# Patient Record
Sex: Male | Born: 1965 | Race: White | Hispanic: No | Marital: Single | Smoking: Former smoker
Health system: Southern US, Community
[De-identification: ages and names within clinical notes are randomized; demographics above are authoritative.]

## PROBLEM LIST (undated history)

## (undated) DIAGNOSIS — K604 Rectal fistula, unspecified: Secondary | ICD-10-CM

## (undated) DIAGNOSIS — I10 Essential (primary) hypertension: Secondary | ICD-10-CM

## (undated) DIAGNOSIS — F17200 Nicotine dependence, unspecified, uncomplicated: Secondary | ICD-10-CM

## (undated) DIAGNOSIS — R39198 Other difficulties with micturition: Secondary | ICD-10-CM

## (undated) DIAGNOSIS — E663 Overweight: Secondary | ICD-10-CM

## (undated) DIAGNOSIS — F329 Major depressive disorder, single episode, unspecified: Secondary | ICD-10-CM

## (undated) DIAGNOSIS — K3184 Gastroparesis: Secondary | ICD-10-CM

## (undated) DIAGNOSIS — R300 Dysuria: Secondary | ICD-10-CM

## (undated) DIAGNOSIS — E876 Hypokalemia: Secondary | ICD-10-CM

## (undated) DIAGNOSIS — I639 Cerebral infarction, unspecified: Secondary | ICD-10-CM

## (undated) DIAGNOSIS — R3129 Other microscopic hematuria: Secondary | ICD-10-CM

## (undated) DIAGNOSIS — F603 Borderline personality disorder: Secondary | ICD-10-CM

## (undated) DIAGNOSIS — K644 Residual hemorrhoidal skin tags: Secondary | ICD-10-CM

## (undated) DIAGNOSIS — F419 Anxiety disorder, unspecified: Secondary | ICD-10-CM

## (undated) DIAGNOSIS — K5790 Diverticulosis of intestine, part unspecified, without perforation or abscess without bleeding: Secondary | ICD-10-CM

## (undated) DIAGNOSIS — F209 Schizophrenia, unspecified: Secondary | ICD-10-CM

## (undated) HISTORY — DX: Dysuria: R30.0

## (undated) HISTORY — PX: TONSILLECTOMY: SUR1361

## (undated) HISTORY — DX: Other difficulties with micturition: R39.198

## (undated) HISTORY — DX: Other microscopic hematuria: R31.29

## (undated) HISTORY — DX: Residual hemorrhoidal skin tags: K64.4

## (undated) HISTORY — DX: Hypokalemia: E87.6

## (undated) HISTORY — PX: LUMBAR SPINE SURGERY: SHX701

## (undated) HISTORY — PX: BACK SURGERY: SHX140

## (undated) HISTORY — PX: COLONOSCOPY: SHX174

## (undated) HISTORY — DX: Schizophrenia, unspecified: F20.9

## (undated) HISTORY — DX: Overweight: E66.3

## (undated) HISTORY — DX: Nicotine dependence, unspecified, uncomplicated: F17.200

## (undated) HISTORY — DX: Essential (primary) hypertension: I10

---

## 2003-06-19 ENCOUNTER — Emergency Department (HOSPITAL_COMMUNITY): Admission: EM | Admit: 2003-06-19 | Discharge: 2003-06-19 | Payer: Self-pay | Admitting: Emergency Medicine

## 2003-09-21 ENCOUNTER — Other Ambulatory Visit: Payer: Self-pay

## 2007-04-06 ENCOUNTER — Emergency Department: Payer: Self-pay | Admitting: Emergency Medicine

## 2009-05-17 ENCOUNTER — Emergency Department (HOSPITAL_COMMUNITY): Admission: EM | Admit: 2009-05-17 | Discharge: 2009-05-17 | Payer: Self-pay | Admitting: Emergency Medicine

## 2010-10-07 ENCOUNTER — Emergency Department (HOSPITAL_COMMUNITY)
Admission: EM | Admit: 2010-10-07 | Discharge: 2010-10-08 | Disposition: A | Discharge status: Home / Self Care | Payer: Self-pay | Attending: Emergency Medicine | Admitting: Emergency Medicine

## 2010-10-07 DIAGNOSIS — F3289 Other specified depressive episodes: Secondary | ICD-10-CM | POA: Insufficient documentation

## 2010-10-07 DIAGNOSIS — F411 Generalized anxiety disorder: Secondary | ICD-10-CM | POA: Insufficient documentation

## 2010-10-07 DIAGNOSIS — I10 Essential (primary) hypertension: Secondary | ICD-10-CM | POA: Insufficient documentation

## 2010-10-07 DIAGNOSIS — F329 Major depressive disorder, single episode, unspecified: Secondary | ICD-10-CM | POA: Insufficient documentation

## 2010-10-07 DIAGNOSIS — K649 Unspecified hemorrhoids: Secondary | ICD-10-CM | POA: Insufficient documentation

## 2010-10-07 LAB — DIFFERENTIAL
Basophils Relative: 0 % (ref 0–1)
Eosinophils Absolute: 0 10*3/uL (ref 0.0–0.7)
Eosinophils Relative: 0 % (ref 0–5)
Lymphs Abs: 2.3 10*3/uL (ref 0.7–4.0)
Monocytes Absolute: 0.5 10*3/uL (ref 0.1–1.0)
Monocytes Relative: 6 % (ref 3–12)

## 2010-10-07 LAB — BASIC METABOLIC PANEL
BUN: 8 mg/dL (ref 6–23)
Calcium: 11 mg/dL — ABNORMAL HIGH (ref 8.4–10.5)
Chloride: 102 mEq/L (ref 96–112)
Creatinine, Ser: 0.81 mg/dL (ref 0.4–1.5)
GFR calc Af Amer: >60 mL/min (ref >60)

## 2010-10-07 LAB — CBC
MCH: 30.3 pg (ref 26.0–34.0)
MCHC: 34.4 g/dL (ref 30.0–36.0)
MCV: 88.1 fL (ref 78.0–100.0)
Platelets: 321 10*3/uL (ref 150–400)
RBC: 4.62 MIL/uL (ref 4.22–5.81)

## 2010-11-21 ENCOUNTER — Emergency Department (HOSPITAL_COMMUNITY)
Admission: EM | Admit: 2010-11-21 | Discharge: 2010-11-21 | Disposition: A | Discharge status: Home / Self Care | Payer: Self-pay | Attending: Emergency Medicine | Admitting: Emergency Medicine

## 2010-11-21 ENCOUNTER — Emergency Department (HOSPITAL_COMMUNITY): Payer: Self-pay

## 2010-11-21 DIAGNOSIS — S92919A Unspecified fracture of unspecified toe(s), initial encounter for closed fracture: Secondary | ICD-10-CM | POA: Insufficient documentation

## 2010-11-21 DIAGNOSIS — IMO0002 Reserved for concepts with insufficient information to code with codable children: Secondary | ICD-10-CM | POA: Insufficient documentation

## 2011-04-01 ENCOUNTER — Emergency Department: Payer: Self-pay | Admitting: Emergency Medicine

## 2011-04-05 ENCOUNTER — Emergency Department: Payer: Self-pay | Admitting: Emergency Medicine

## 2011-10-17 ENCOUNTER — Emergency Department: Payer: Self-pay | Admitting: Emergency Medicine

## 2011-11-05 ENCOUNTER — Emergency Department: Payer: Self-pay | Admitting: Emergency Medicine

## 2011-11-05 LAB — URINALYSIS, COMPLETE
Bilirubin,UR: NEGATIVE
Ketone: NEGATIVE
Nitrite: NEGATIVE
Ph: 7 (ref 4.5–8.0)
RBC,UR: 1 /HPF (ref 0–5)
Squamous Epithelial: NONE SEEN

## 2011-11-05 LAB — CBC
HCT: 41.2 % (ref 40.0–52.0)
MCV: 88 fL (ref 80–100)
Platelet: 334 10*3/uL (ref 150–440)
RBC: 4.69 10*6/uL (ref 4.40–5.90)
RDW: 13 % (ref 11.5–14.5)
WBC: 7.2 10*3/uL (ref 3.8–10.6)

## 2011-11-05 LAB — COMPREHENSIVE METABOLIC PANEL
Albumin: 4.4 g/dL (ref 3.4–5.0)
Alkaline Phosphatase: 59 U/L (ref 50–136)
BUN: 7 mg/dL (ref 7–18)
Bilirubin,Total: 0.6 mg/dL (ref 0.2–1.0)
Calcium, Total: 9.3 mg/dL (ref 8.5–10.1)
Co2: 27 mmol/L (ref 21–32)
Creatinine: 0.92 mg/dL (ref 0.60–1.30)
EGFR (African American): >60
Glucose: 114 mg/dL — ABNORMAL HIGH (ref 65–99)
SGOT(AST): 28 U/L (ref 15–37)
Sodium: 133 mmol/L — ABNORMAL LOW (ref 136–145)
Total Protein: 7.7 g/dL (ref 6.4–8.2)

## 2011-11-05 LAB — DRUG SCREEN, URINE
Amphetamines, Ur Screen: NEGATIVE (ref ?–1000)
Barbiturates, Ur Screen: NEGATIVE (ref ?–200)
Opiate, Ur Screen: NEGATIVE (ref ?–300)
Phencyclidine (PCP) Ur S: NEGATIVE (ref ?–25)
Tricyclic, Ur Screen: POSITIVE (ref ?–1000)

## 2011-11-05 LAB — TROPONIN I: Troponin-I: <0.02 ng/mL

## 2011-11-06 LAB — TROPONIN I: Troponin-I: <0.02 ng/mL

## 2011-12-01 ENCOUNTER — Emergency Department: Payer: Self-pay | Admitting: Unknown Physician Specialty

## 2011-12-01 LAB — CBC
HCT: 40 % (ref 40.0–52.0)
HGB: 13.9 g/dL (ref 13.0–18.0)
MCH: 30.1 pg (ref 26.0–34.0)
MCHC: 34.8 g/dL (ref 32.0–36.0)
MCV: 87 fL (ref 80–100)

## 2011-12-01 LAB — PROTIME-INR: Prothrombin Time: 12.5 secs (ref 11.5–14.7)

## 2011-12-01 LAB — URINALYSIS, COMPLETE
Bilirubin,UR: NEGATIVE
Glucose,UR: NEGATIVE mg/dL (ref 0–75)
Ketone: NEGATIVE
Ph: 7 (ref 4.5–8.0)
Squamous Epithelial: NONE SEEN
WBC UR: NONE SEEN /HPF (ref 0–5)

## 2011-12-01 LAB — COMPREHENSIVE METABOLIC PANEL
Albumin: 4.5 g/dL (ref 3.4–5.0)
Alkaline Phosphatase: 69 U/L (ref 50–136)
Anion Gap: 9 (ref 7–16)
BUN: 7 mg/dL (ref 7–18)
Bilirubin,Total: 0.5 mg/dL (ref 0.2–1.0)
Chloride: 91 mmol/L — ABNORMAL LOW (ref 98–107)
Creatinine: 1.01 mg/dL (ref 0.60–1.30)
EGFR (African American): >60
Osmolality: 255 (ref 275–301)
Potassium: 4.2 mmol/L (ref 3.5–5.1)
Total Protein: 8 g/dL (ref 6.4–8.2)

## 2011-12-01 LAB — LIPASE, BLOOD: Lipase: 141 U/L (ref 73–393)

## 2012-04-01 ENCOUNTER — Emergency Department: Payer: Self-pay | Admitting: Emergency Medicine

## 2012-04-01 LAB — COMPREHENSIVE METABOLIC PANEL
Anion Gap: 9 (ref 7–16)
Calcium, Total: 9.4 mg/dL (ref 8.5–10.1)
Chloride: 102 mmol/L (ref 98–107)
Co2: 25 mmol/L (ref 21–32)
EGFR (African American): >60
EGFR (Non-African Amer.): >60
Glucose: 101 mg/dL — ABNORMAL HIGH (ref 65–99)
Osmolality: 271 (ref 275–301)
Potassium: 3.6 mmol/L (ref 3.5–5.1)
SGOT(AST): 32 U/L (ref 15–37)
Sodium: 136 mmol/L (ref 136–145)

## 2012-04-01 LAB — CBC
HGB: 15.5 g/dL (ref 13.0–18.0)
MCH: 31.7 pg (ref 26.0–34.0)
MCHC: 35.9 g/dL (ref 32.0–36.0)
Platelet: 325 10*3/uL (ref 150–440)
RBC: 4.89 10*6/uL (ref 4.40–5.90)
RDW: 12.3 % (ref 11.5–14.5)
WBC: 7.7 10*3/uL (ref 3.8–10.6)

## 2012-04-01 LAB — URINALYSIS, COMPLETE
Bilirubin,UR: NEGATIVE
Glucose,UR: NEGATIVE mg/dL (ref 0–75)
Leukocyte Esterase: NEGATIVE
RBC,UR: 8 /HPF (ref 0–5)
Squamous Epithelial: NONE SEEN
WBC UR: 1 /HPF (ref 0–5)

## 2012-04-01 LAB — LIPASE, BLOOD: Lipase: 252 U/L (ref 73–393)

## 2012-06-28 ENCOUNTER — Emergency Department: Payer: Self-pay | Admitting: Emergency Medicine

## 2012-06-28 LAB — URINALYSIS, COMPLETE
Bilirubin,UR: NEGATIVE
Granular Cast: 9
Ketone: NEGATIVE
Leukocyte Esterase: NEGATIVE
Protein: NEGATIVE

## 2012-06-28 LAB — COMPREHENSIVE METABOLIC PANEL
Alkaline Phosphatase: 61 U/L (ref 50–136)
BUN: 8 mg/dL (ref 7–18)
Bilirubin,Total: 0.6 mg/dL (ref 0.2–1.0)
Co2: 28 mmol/L (ref 21–32)
Creatinine: 1.02 mg/dL (ref 0.60–1.30)
EGFR (African American): >60
Glucose: 140 mg/dL — ABNORMAL HIGH (ref 65–99)
SGPT (ALT): 35 U/L (ref 12–78)
Sodium: 134 mmol/L — ABNORMAL LOW (ref 136–145)
Total Protein: 7.3 g/dL (ref 6.4–8.2)

## 2012-06-28 LAB — LIPASE, BLOOD: Lipase: 233 U/L (ref 73–393)

## 2012-06-28 LAB — CBC
HCT: 43.4 % (ref 40.0–52.0)
MCH: 30.8 pg (ref 26.0–34.0)
MCV: 87 fL (ref 80–100)
Platelet: 325 10*3/uL (ref 150–440)
RDW: 12.1 % (ref 11.5–14.5)

## 2012-10-13 LAB — URINALYSIS, COMPLETE
Bacteria: NONE SEEN
Bilirubin,UR: NEGATIVE
Glucose,UR: NEGATIVE mg/dL (ref 0–75)
Leukocyte Esterase: NEGATIVE
Nitrite: NEGATIVE
RBC,UR: 3 /HPF (ref 0–5)
Specific Gravity: 1.021 (ref 1.003–1.030)
WBC UR: 1 /HPF (ref 0–5)

## 2012-10-13 LAB — CBC
HCT: 43.8 % (ref 40.0–52.0)
HGB: 15.3 g/dL (ref 13.0–18.0)
MCH: 30.8 pg (ref 26.0–34.0)
MCHC: 34.9 g/dL (ref 32.0–36.0)
MCV: 88 fL (ref 80–100)
Platelet: 325 10*3/uL (ref 150–440)
RBC: 4.96 10*6/uL (ref 4.40–5.90)
RDW: 12.6 % (ref 11.5–14.5)
WBC: 10.3 10*3/uL (ref 3.8–10.6)

## 2012-10-13 LAB — DRUG SCREEN, URINE
Barbiturates, Ur Screen: NEGATIVE (ref ?–200)
Cannabinoid 50 Ng, Ur ~~LOC~~: NEGATIVE (ref ?–50)
MDMA (Ecstasy)Ur Screen: NEGATIVE (ref ?–500)
Methadone, Ur Screen: NEGATIVE (ref ?–300)
Opiate, Ur Screen: NEGATIVE (ref ?–300)
Phencyclidine (PCP) Ur S: NEGATIVE (ref ?–25)
Tricyclic, Ur Screen: NEGATIVE (ref ?–1000)

## 2012-10-13 LAB — COMPREHENSIVE METABOLIC PANEL
Alkaline Phosphatase: 62 U/L (ref 50–136)
BUN: 13 mg/dL (ref 7–18)
Co2: 24 mmol/L (ref 21–32)
Creatinine: 1.07 mg/dL (ref 0.60–1.30)
Glucose: 123 mg/dL — ABNORMAL HIGH (ref 65–99)
Osmolality: 273 (ref 275–301)
SGOT(AST): 31 U/L (ref 15–37)
Sodium: 136 mmol/L (ref 136–145)
Total Protein: 7.7 g/dL (ref 6.4–8.2)

## 2012-10-15 ENCOUNTER — Inpatient Hospital Stay: Payer: Self-pay | Admitting: Psychiatry

## 2012-10-16 LAB — BEHAVIORAL MEDICINE 1 PANEL
Albumin: 3.7 g/dL (ref 3.4–5.0)
Alkaline Phosphatase: 61 U/L (ref 50–136)
Anion Gap: 6 — ABNORMAL LOW (ref 7–16)
BUN: 11 mg/dL (ref 7–18)
Basophil #: 0 10*3/uL (ref 0.0–0.1)
Basophil %: 0.6 %
Bilirubin,Total: 0.4 mg/dL (ref 0.2–1.0)
Calcium, Total: 9 mg/dL (ref 8.5–10.1)
Chloride: 103 mmol/L (ref 98–107)
Co2: 27 mmol/L (ref 21–32)
Creatinine: 1.03 mg/dL (ref 0.60–1.30)
EGFR (African American): >60
EGFR (Non-African Amer.): >60
Eosinophil #: 0.2 10*3/uL (ref 0.0–0.7)
Eosinophil %: 2.6 %
Glucose: 93 mg/dL (ref 65–99)
HCT: 41 % (ref 40.0–52.0)
HGB: 14.7 g/dL (ref 13.0–18.0)
Lymphocyte #: 3.3 10*3/uL (ref 1.0–3.6)
Lymphocyte %: 41.2 %
MCH: 31.3 pg (ref 26.0–34.0)
MCHC: 35.8 g/dL (ref 32.0–36.0)
MCV: 88 fL (ref 80–100)
Monocyte #: 0.7 x10 3/mm (ref 0.2–1.0)
Monocyte %: 8.9 %
Neutrophil #: 3.7 10*3/uL (ref 1.4–6.5)
Neutrophil %: 46.7 %
Osmolality: 271 (ref 275–301)
Platelet: 306 10*3/uL (ref 150–440)
Potassium: 3.7 mmol/L (ref 3.5–5.1)
RBC: 4.69 10*6/uL (ref 4.40–5.90)
RDW: 12.7 % (ref 11.5–14.5)
SGOT(AST): 18 U/L (ref 15–37)
SGPT (ALT): 32 U/L (ref 12–78)
Sodium: 136 mmol/L (ref 136–145)
Thyroid Stimulating Horm: 3.91 u[IU]/mL
Total Protein: 6.9 g/dL (ref 6.4–8.2)
WBC: 8 10*3/uL (ref 3.8–10.6)

## 2012-10-17 LAB — URINALYSIS, COMPLETE
Bacteria: NONE SEEN
Bilirubin,UR: NEGATIVE
Glucose,UR: NEGATIVE mg/dL (ref 0–75)
Ketone: NEGATIVE
Leukocyte Esterase: NEGATIVE
Nitrite: NEGATIVE
Ph: 6 (ref 4.5–8.0)
Protein: NEGATIVE
RBC,UR: 1 /HPF (ref 0–5)
Specific Gravity: 1.01 (ref 1.003–1.030)
Squamous Epithelial: NONE SEEN
WBC UR: <1 /HPF (ref 0–5)

## 2012-11-20 ENCOUNTER — Inpatient Hospital Stay: Payer: Self-pay | Admitting: Psychiatry

## 2012-11-20 LAB — CBC
HCT: 43.7 % (ref 40.0–52.0)
HGB: 15.4 g/dL (ref 13.0–18.0)
RBC: 4.9 10*6/uL (ref 4.40–5.90)
RDW: 13.1 % (ref 11.5–14.5)

## 2012-11-20 LAB — URINALYSIS, COMPLETE
Bilirubin,UR: NEGATIVE
Leukocyte Esterase: NEGATIVE
Nitrite: NEGATIVE
Protein: NEGATIVE
RBC,UR: 4 /HPF (ref 0–5)
Specific Gravity: 1.02 (ref 1.003–1.030)
WBC UR: 1 /HPF (ref 0–5)

## 2012-11-20 LAB — COMPREHENSIVE METABOLIC PANEL
Alkaline Phosphatase: 86 U/L (ref 50–136)
Anion Gap: 7 (ref 7–16)
BUN: 10 mg/dL (ref 7–18)
Bilirubin,Total: 0.4 mg/dL (ref 0.2–1.0)
Chloride: 104 mmol/L (ref 98–107)
Creatinine: 1.13 mg/dL (ref 0.60–1.30)
Osmolality: 283 (ref 275–301)
Potassium: 3.3 mmol/L — ABNORMAL LOW (ref 3.5–5.1)
SGOT(AST): 24 U/L (ref 15–37)
SGPT (ALT): 26 U/L (ref 12–78)
Total Protein: 7.5 g/dL (ref 6.4–8.2)

## 2012-11-20 LAB — DRUG SCREEN, URINE
Benzodiazepine, Ur Scrn: POSITIVE (ref ?–200)
Cocaine Metabolite,Ur ~~LOC~~: NEGATIVE (ref ?–300)
MDMA (Ecstasy)Ur Screen: NEGATIVE (ref ?–500)
Methadone, Ur Screen: NEGATIVE (ref ?–300)
Opiate, Ur Screen: NEGATIVE (ref ?–300)
Phencyclidine (PCP) Ur S: NEGATIVE (ref ?–25)
Tricyclic, Ur Screen: NEGATIVE (ref ?–1000)

## 2012-11-20 LAB — TSH: Thyroid Stimulating Horm: 2.62 u[IU]/mL

## 2012-11-21 LAB — BEHAVIORAL MEDICINE 1 PANEL
Albumin: 3.4 g/dL (ref 3.4–5.0)
Alkaline Phosphatase: 69 U/L (ref 50–136)
BUN: 13 mg/dL (ref 7–18)
Basophil #: 0.1 10*3/uL (ref 0.0–0.1)
Chloride: 103 mmol/L (ref 98–107)
Co2: 30 mmol/L (ref 21–32)
Creatinine: 0.95 mg/dL (ref 0.60–1.30)
Eosinophil %: 4.6 %
HCT: 41.1 % (ref 40.0–52.0)
Lymphocyte #: 2.2 10*3/uL (ref 1.0–3.6)
Lymphocyte %: 30.9 %
MCH: 31.5 pg (ref 26.0–34.0)
MCV: 89 fL (ref 80–100)
Monocyte #: 0.6 x10 3/mm (ref 0.2–1.0)
Monocyte %: 9 %
Neutrophil #: 3.8 10*3/uL (ref 1.4–6.5)
RBC: 4.62 10*6/uL (ref 4.40–5.90)
RDW: 13.2 % (ref 11.5–14.5)
SGOT(AST): 21 U/L (ref 15–37)

## 2012-11-22 LAB — URINALYSIS, COMPLETE
Bacteria: NONE SEEN
Bilirubin,UR: NEGATIVE
Glucose,UR: NEGATIVE mg/dL (ref 0–75)
Ketone: NEGATIVE
Leukocyte Esterase: NEGATIVE
Ph: 6 (ref 4.5–8.0)
Protein: NEGATIVE
RBC,UR: <1 /HPF (ref 0–5)
Specific Gravity: 1.01 (ref 1.003–1.030)
Squamous Epithelial: NONE SEEN

## 2013-01-13 ENCOUNTER — Emergency Department: Payer: Self-pay | Admitting: Emergency Medicine

## 2013-02-11 ENCOUNTER — Emergency Department: Payer: Self-pay | Admitting: Emergency Medicine

## 2013-04-04 ENCOUNTER — Emergency Department: Payer: Self-pay | Admitting: Emergency Medicine

## 2013-04-04 LAB — URINALYSIS, COMPLETE
Bacteria: NONE SEEN
Bilirubin,UR: NEGATIVE
Ketone: NEGATIVE
Leukocyte Esterase: NEGATIVE
Ph: 6 (ref 4.5–8.0)
Protein: NEGATIVE
Specific Gravity: 1.01 (ref 1.003–1.030)
Squamous Epithelial: NONE SEEN
WBC UR: <1 /HPF (ref 0–5)

## 2013-04-04 LAB — CBC
HCT: 40.4 % (ref 40.0–52.0)
HGB: 14.3 g/dL (ref 13.0–18.0)
MCH: 30.7 pg (ref 26.0–34.0)
MCHC: 35.3 g/dL (ref 32.0–36.0)
MCV: 87 fL (ref 80–100)
Platelet: 331 10*3/uL (ref 150–440)
RBC: 4.64 10*6/uL (ref 4.40–5.90)
RDW: 12.7 % (ref 11.5–14.5)
WBC: 7.7 10*3/uL (ref 3.8–10.6)

## 2013-04-04 LAB — COMPREHENSIVE METABOLIC PANEL
Alkaline Phosphatase: 58 U/L (ref 50–136)
Anion Gap: 4 — ABNORMAL LOW (ref 7–16)
BUN: 11 mg/dL (ref 7–18)
Bilirubin,Total: 0.7 mg/dL (ref 0.2–1.0)
Calcium, Total: 9.4 mg/dL (ref 8.5–10.1)
Co2: 30 mmol/L (ref 21–32)
Creatinine: 1.04 mg/dL (ref 0.60–1.30)
Glucose: 116 mg/dL — ABNORMAL HIGH (ref 65–99)
SGPT (ALT): 37 U/L (ref 12–78)
Total Protein: 7.6 g/dL (ref 6.4–8.2)

## 2013-04-14 ENCOUNTER — Emergency Department: Payer: Self-pay | Admitting: Emergency Medicine

## 2013-05-31 ENCOUNTER — Emergency Department: Payer: Self-pay | Admitting: Emergency Medicine

## 2013-05-31 LAB — URINALYSIS, COMPLETE
BILIRUBIN, UR: NEGATIVE
Blood: NEGATIVE
GLUCOSE, UR: NEGATIVE mg/dL (ref 0–75)
KETONE: NEGATIVE
Leukocyte Esterase: NEGATIVE
Nitrite: NEGATIVE
Ph: 7 (ref 4.5–8.0)
Protein: NEGATIVE
RBC,UR: <1 /HPF (ref 0–5)
SPECIFIC GRAVITY: 1.009 (ref 1.003–1.030)
SQUAMOUS EPITHELIAL: NONE SEEN

## 2013-05-31 LAB — DRUG SCREEN, URINE

## 2013-05-31 LAB — COMPREHENSIVE METABOLIC PANEL
ALK PHOS: 39 U/L — AB
AST: 35 U/L (ref 15–37)
Albumin: 4.1 g/dL (ref 3.4–5.0)
Anion Gap: 10 (ref 7–16)
BUN: 7 mg/dL (ref 7–18)
Bilirubin,Total: 0.4 mg/dL (ref 0.2–1.0)
CALCIUM: 9.2 mg/dL (ref 8.5–10.1)
CREATININE: 1.01 mg/dL (ref 0.60–1.30)
Chloride: 96 mmol/L — ABNORMAL LOW (ref 98–107)
Co2: 27 mmol/L (ref 21–32)
EGFR (African American): >60
Glucose: 133 mg/dL — ABNORMAL HIGH (ref 65–99)
Osmolality: 266 (ref 275–301)
Potassium: 2.6 mmol/L — ABNORMAL LOW (ref 3.5–5.1)
SGPT (ALT): 38 U/L (ref 12–78)
SODIUM: 133 mmol/L — AB (ref 136–145)
TOTAL PROTEIN: 7.3 g/dL (ref 6.4–8.2)

## 2013-05-31 LAB — SALICYLATE LEVEL: Salicylates, Serum: 2.6 mg/dL

## 2013-05-31 LAB — MAGNESIUM: Magnesium: 1.7 mg/dL — ABNORMAL LOW

## 2013-05-31 LAB — ETHANOL
Ethanol %: 0.185 % — ABNORMAL HIGH (ref 0.000–0.080)
Ethanol: 185 mg/dL

## 2013-05-31 LAB — CBC
HCT: 39.8 % — ABNORMAL LOW (ref 40.0–52.0)
HGB: 13.9 g/dL (ref 13.0–18.0)
MCH: 30.5 pg (ref 26.0–34.0)
MCHC: 34.9 g/dL (ref 32.0–36.0)
MCV: 87 fL (ref 80–100)
PLATELETS: 397 10*3/uL (ref 150–440)
RBC: 4.56 10*6/uL (ref 4.40–5.90)
RDW: 12.4 % (ref 11.5–14.5)
WBC: 11.9 10*3/uL — ABNORMAL HIGH (ref 3.8–10.6)

## 2013-05-31 LAB — CK TOTAL AND CKMB (NOT AT ARMC)
CK, Total: 249 U/L — ABNORMAL HIGH (ref 35–232)
CK-MB: 1.5 ng/mL (ref 0.5–3.6)

## 2013-05-31 LAB — TROPONIN I: Troponin-I: <0.02 ng/mL

## 2013-05-31 LAB — ACETAMINOPHEN LEVEL: Acetaminophen: <2 ug/mL

## 2013-06-28 ENCOUNTER — Emergency Department: Payer: Self-pay | Admitting: Emergency Medicine

## 2013-07-07 ENCOUNTER — Emergency Department (INDEPENDENT_AMBULATORY_CARE_PROVIDER_SITE_OTHER)
Admission: EM | Admit: 2013-07-07 | Discharge: 2013-07-07 | Disposition: A | Discharge status: Home / Self Care | Payer: Self-pay | Source: Home / Self Care | Attending: Family Medicine | Admitting: Family Medicine

## 2013-07-07 ENCOUNTER — Encounter (HOSPITAL_COMMUNITY): Payer: Self-pay | Admitting: Emergency Medicine

## 2013-07-07 DIAGNOSIS — N419 Inflammatory disease of prostate, unspecified: Secondary | ICD-10-CM

## 2013-07-07 HISTORY — DX: Essential (primary) hypertension: I10

## 2013-07-07 HISTORY — DX: Rectal fistula, unspecified: K60.40

## 2013-07-07 HISTORY — DX: Rectal fistula: K60.4

## 2013-07-07 LAB — POCT URINALYSIS DIP (DEVICE)
Bilirubin Urine: NEGATIVE
GLUCOSE, UA: NEGATIVE mg/dL
Ketones, ur: NEGATIVE mg/dL
Leukocytes, UA: NEGATIVE
NITRITE: NEGATIVE
PROTEIN: NEGATIVE mg/dL
Specific Gravity, Urine: 1.01 (ref 1.005–1.030)
UROBILINOGEN UA: 0.2 mg/dL (ref 0.0–1.0)
pH: 7.5 (ref 5.0–8.0)

## 2013-07-07 MED ORDER — CIPROFLOXACIN HCL 500 MG PO TABS: 500.0000 mg | ORAL_TABLET | Freq: Two times a day (BID) | ORAL | Status: DC

## 2013-07-07 MED ORDER — DOXYCYCLINE HYCLATE 100 MG PO CAPS: 100.0000 mg | ORAL_CAPSULE | Freq: Two times a day (BID) | ORAL | Status: DC

## 2013-07-07 NOTE — Discharge Instructions (Signed)
Thank you for coming in today. Take cipro and doxycycline twice daily for 15 days.  FOLLOW UP WITH YOUR DOCTOR AS SOON AS POSSIBLE TO TALK ABOUT YOUR NARROW STOOL.  YOU MAY NEED TO HAVE A COLON SCOPY.   Prostatitis The prostate gland is about the size and shape of a walnut. It is located just below your bladder. It produces one of the components of semen, which is made up of sperm and the fluids that help nourish and transport it out from the testicles. Prostatitis is inflammation of the prostate gland.  There are four types of prostatitis:  Acute bacterial prostatitis This is the least common type of prostatitis. It starts quickly and usually is associated with a bladder infection, high fever, and shaking chills. It can occur at any age.  Chronic bacterial prostatitis This is a persistent bacterial infection in the prostate. It usually develops from repeated acute bacterial prostatitis or acute bacterial prostatitis that was not properly treated. It can occur in men of any age but is most common in middle-aged men whose prostate has begun to enlarge. The symptoms are not as severe as those in acute bacterial prostatitis. Discomfort in the part of your body that is in front of your rectum and below your scrotum (perineum), lower abdomen, or in the head of your penis (glans) may represent your primary discomfort.  Chronic prostatitis (nonbacterial) This is the most common type of prostatitis. It is inflammation of the prostate gland that is not caused by a bacterial infection. The cause is unknown and may be associated with a viral infection or autoimmune disorder.  Prostatodynia (pelvic floor disorder) This is associated with increased muscular tone in the pelvis surrounding the prostate. CAUSES The causes of bacterial prostatitis are bacterial infection. The causes of the other types of prostatitis are unknown.  SYMPTOMS  Symptoms can vary depending upon the type of prostatitis that exists.  There can also be overlap in symptoms. Possible symptoms for each type of prostatitis are listed below. Acute Bacterial Prostatitis  Painful urination.  Fever or chills.  Muscle or joint pains.  Low back pain.  Low abdominal pain.  Inability to empty bladder completely. Chronic Bacterial Prostatitis, Chronic Nonbacterial Prostatitis, and Prostatodynia  Sudden urge to urinate.  Frequent urination.  Difficulty starting urine stream.  Weak urine stream.  Discharge from the urethra.  Dribbling after urination.  Rectal pain.  Pain in the testicles, penis, or tip of the penis.  Pain in the perineum.  Problems with sexual function.  Painful ejaculation.  Bloody semen. DIAGNOSIS  In order to diagnose prostatitis, your health care provider will ask about your symptoms. One or more urine samples will be taken and tested (urinalysis). If the urinalysis result is negative for bacteria, your health care provider may use a finger to feel your prostate (digital rectal exam). This exam helps your health care provider determine if your prostate is swollen and tender. It will also produce a specimen of semen that can be analyzed. TREATMENT  Treatment for prostatitis depends on the cause. If a bacterial infection is the cause, it can be treated with antibiotic medicine. In cases of chronic bacterial prostatitis, the use of antibiotics for up to 1 month or 6 weeks may be necessary. Your health care provider may instruct you to take sitz baths to help relieve pain. A sitz bath is a bath of hot water in which your hips and buttocks are under water. This relaxes the pelvic floor muscles and often  helps to relieve the pressure on your prostate. HOME CARE INSTRUCTIONS   Take all medicines as directed by your health care provider.  Take sitz baths as directed by your health care provider. SEEK MEDICAL CARE IF:   Your symptoms get worse, not better.  You have a fever. SEEK IMMEDIATE MEDICAL  CARE IF:   You have chills.  You feel nauseous or vomit.  You feel lightheaded or faint.  You are unable to urinate.  You have blood or blood clots in your urine. Document Released: 05/05/2000 Document Revised: 02/26/2013 Document Reviewed: 11/25/2012 [facility] Patient Information 2014 [place].

## 2013-07-07 NOTE — ED Notes (Signed)
Reported history of prostatitis, feels like it is coming back, as he is starting to have pain again

## 2013-07-07 NOTE — ED Provider Notes (Signed)
Jeremiah [name] is a 48 y.o. male who presents to Urgent Care today for rectal pain associated with dysuria. This is been present worsening for the past few days. This is consistent with prior episodes of prostatitis. He recently was treated for prostatitis with Cipro and doxycycline. He stopped his medications about a week or 2 ago when his symptoms are returning. Additionally he notes over about one week. Narrow caliber stools. He describes them as pencil in diameter. He typically takes stool softener but stopped doing that. No fevers or chills nausea vomiting or diarrhea.   Past Medical History  Diagnosis Date  . Hypertension   . Rectal fistula    History  Substance Use Topics  . Smoking status: Current Every Day Smoker  . Smokeless tobacco: Not on file  . Alcohol Use: Yes   ROS as above Medications: No current facility-administered medications for this encounter.   Current Outpatient Prescriptions  Medication Sig Dispense Refill  . cloNIDine (CATAPRES) 0.1 MG tablet Take 0.1 mg by mouth 2 (two) times daily.      Marland Kitchen FLUoxetine (PROZAC) 20 MG tablet Take 20 mg by mouth daily.      . metoprolol (LOPRESSOR) 50 MG tablet Take 50 mg by mouth 2 (two) times daily.      . ranitidine (ZANTAC) 150 MG tablet Take 150 mg by mouth 2 (two) times daily.      . ciprofloxacin (CIPRO) 500 MG tablet Take 1 tablet (500 mg total) by mouth 2 (two) times daily.  30 tablet  0  . doxycycline (VIBRAMYCIN) 100 MG capsule Take 1 capsule (100 mg total) by mouth 2 (two) times daily.  30 capsule  0    Exam:  BP 151/97  Pulse 73  Temp(Src) 99.2 F (37.3 C) (Oral)  Resp 14  SpO2 99% Gen: Well NAD HEENT: EOMI,  MMM Lungs: Normal work of breathing. CTABL Heart: RRR no MRG Abd: NABS, Soft. NT, ND Exts: Brisk capillary refill, warm and well perfused.  Rectal exam: Normal appearing anus. Prostate is enlarged boggy and tender. No rectal masses palpated.  Results for orders placed during the hospital encounter  of 07/07/13 (from the past 24 hour(s))  POCT URINALYSIS DIP (DEVICE)     Status: Abnormal   Collection Time    07/07/13  3:24 PM      Result Value Ref Range   Glucose, UA NEGATIVE  NEGATIVE mg/dL   Bilirubin Urine NEGATIVE  NEGATIVE   Ketones, ur NEGATIVE  NEGATIVE mg/dL   Specific Gravity, Urine 1.010  1.005 - 1.030   Hgb urine dipstick TRACE (*) NEGATIVE   pH 7.5  5.0 - 8.0   Protein, ur NEGATIVE  NEGATIVE mg/dL   Urobilinogen, UA 0.2  0.0 - 1.0 mg/dL   Nitrite NEGATIVE  NEGATIVE   Leukocytes, UA NEGATIVE  NEGATIVE   No results found.  Assessment and Plan: 48 y.o. male with prostatitis. Urine culture pending. Plan to resume Cipro and doxycycline. Additionally recommend patient followup with his primary care provider as soon as possible to discuss his narrow caliber stool. He will likely need colonoscopy to evaluate for colon cancer. I discussed this with the patient directly.  Discussed warning signs or symptoms. Please see discharge instructions. Patient expresses understanding.    Gregor Hams, MD 07/07/13 1537

## 2013-07-08 LAB — URINE CULTURE
COLONY COUNT: NO GROWTH
CULTURE: NO GROWTH

## 2013-08-07 ENCOUNTER — Ambulatory Visit: Payer: Self-pay | Admitting: Gastroenterology

## 2013-08-25 ENCOUNTER — Emergency Department (HOSPITAL_COMMUNITY): Payer: Self-pay

## 2013-08-25 ENCOUNTER — Encounter (HOSPITAL_COMMUNITY): Payer: Self-pay | Admitting: Emergency Medicine

## 2013-08-25 ENCOUNTER — Emergency Department (HOSPITAL_COMMUNITY)
Admission: EM | Admit: 2013-08-25 | Discharge: 2013-08-25 | Disposition: A | Discharge status: Home / Self Care | Payer: Self-pay | Attending: Emergency Medicine | Admitting: Emergency Medicine

## 2013-08-25 DIAGNOSIS — Z8719 Personal history of other diseases of the digestive system: Secondary | ICD-10-CM | POA: Insufficient documentation

## 2013-08-25 DIAGNOSIS — Z79899 Other long term (current) drug therapy: Secondary | ICD-10-CM | POA: Insufficient documentation

## 2013-08-25 DIAGNOSIS — R109 Unspecified abdominal pain: Secondary | ICD-10-CM

## 2013-08-25 DIAGNOSIS — I10 Essential (primary) hypertension: Secondary | ICD-10-CM | POA: Insufficient documentation

## 2013-08-25 DIAGNOSIS — F411 Generalized anxiety disorder: Secondary | ICD-10-CM | POA: Insufficient documentation

## 2013-08-25 DIAGNOSIS — F17200 Nicotine dependence, unspecified, uncomplicated: Secondary | ICD-10-CM | POA: Insufficient documentation

## 2013-08-25 LAB — CBC WITH DIFFERENTIAL/PLATELET
BASOS ABS: 0 10*3/uL (ref 0.0–0.1)
BASOS PCT: 0 % (ref 0–1)
EOS ABS: 0.2 10*3/uL (ref 0.0–0.7)
EOS PCT: 2 % (ref 0–5)
HCT: 41 % (ref 39.0–52.0)
Hemoglobin: 14.7 g/dL (ref 13.0–17.0)
LYMPHS ABS: 2.2 10*3/uL (ref 0.7–4.0)
Lymphocytes Relative: 26 % (ref 12–46)
MCH: 31.3 pg (ref 26.0–34.0)
MCHC: 35.9 g/dL (ref 30.0–36.0)
MCV: 87.4 fL (ref 78.0–100.0)
Monocytes Absolute: 0.5 10*3/uL (ref 0.1–1.0)
Monocytes Relative: 6 % (ref 3–12)
NEUTROS PCT: 66 % (ref 43–77)
Neutro Abs: 5.7 10*3/uL (ref 1.7–7.7)
PLATELETS: 301 10*3/uL (ref 150–400)
RBC: 4.69 MIL/uL (ref 4.22–5.81)
RDW: 13 % (ref 11.5–15.5)
WBC: 8.7 10*3/uL (ref 4.0–10.5)

## 2013-08-25 LAB — I-STAT CHEM 8, ED
BUN: 7 mg/dL (ref 6–23)
Calcium, Ion: 1.14 mmol/L (ref 1.12–1.23)
Chloride: 98 mEq/L (ref 96–112)
Creatinine, Ser: 1 mg/dL (ref 0.50–1.35)
Glucose, Bld: 104 mg/dL — ABNORMAL HIGH (ref 70–99)
HEMATOCRIT: 44 % (ref 39.0–52.0)
HEMOGLOBIN: 15 g/dL (ref 13.0–17.0)
POTASSIUM: 3.7 meq/L (ref 3.7–5.3)
SODIUM: 137 meq/L (ref 137–147)
TCO2: 24 mmol/L (ref 0–100)

## 2013-08-25 MED ORDER — ACETAMINOPHEN 325 MG PO TABS
650.0000 mg | ORAL_TABLET | Freq: Once | ORAL | Status: AC
  Administered 2013-08-25: 650 mg via ORAL
  Filled 2013-08-25: qty 2

## 2013-08-25 NOTE — ED Notes (Signed)
Pt given a diet coke per MD order.

## 2013-08-25 NOTE — ED Notes (Addendum)
Pt c/o abd pain onset approx 10pm. St's he had sm BM earlier but was mostly mucus.  Nausea without vomiting.  St's his abd is swollen and feels like it's full of gas.   St's he has not been passing any gas.

## 2013-08-25 NOTE — ED Provider Notes (Signed)
CSN: [id number]     Arrival date & time 08/25/13  0159 History   First MD Initiated Contact with Patient 08/25/13 0440     Chief Complaint  Patient presents with  . Abdominal Pain     (Consider location/radiation/quality/duration/timing/severity/associated sxs/prior Treatment) HPI History provided by patient. Abdominal bloating, worse in the morning over the last few days. Pain comes and goes. Pain is migrating. Tonight at home pain became more severe. Patient states concern about elevated blood pressure. No associated fevers or chills. No nausea vomiting or diarrhea. Has had some hard stools with last bowel movement yesterday. No blood in stools. Had a colonoscopy a few weeks ago with polypectomy and was also told that he has diverticulosis. No fevers or chills. At this time he expresses some anxiety but his pain has resolved. His been trying multitude of medications for his symptoms without relief, taking Zantac, Colace, Mylicon at home. No known aggravating or alleviating factors.  Past Medical History  Diagnosis Date  . Hypertension   . Rectal fistula    Past Surgical History  Procedure Laterality Date  . Lumbar spine surgery    . Colonoscopy     History reviewed. No pertinent family history. History  Substance Use Topics  . Smoking status: Current Every Day Smoker  . Smokeless tobacco: Not on file  . Alcohol Use: Yes     Comment: every day or every other day (2-4  12oz cans)    Review of Systems  Constitutional: Negative for fever and chills.  Respiratory: Negative for shortness of breath.   Cardiovascular: Negative for chest pain.  Gastrointestinal: Positive for abdominal pain. Negative for vomiting.  Genitourinary: Negative for flank pain.  Musculoskeletal: Negative for back pain.  Skin: Negative for rash.  Neurological: Negative for headaches.  All other systems reviewed and are negative.      Allergies  Milk-related compounds  Home Medications   Current  Outpatient Rx  Name  Route  Sig  Dispense  Refill  . ALPRAZolam (XANAX) 1 MG tablet   Oral   Take 1 mg by mouth 3 (three) times daily.         . cloNIDine (CATAPRES) 0.1 MG tablet   Oral   Take 0.1 mg by mouth 2 (two) times daily.         Marland Kitchen docusate sodium (COLACE) 100 MG capsule   Oral   Take 100 mg by mouth daily. Take 2 capsules, then repeat 1 capsule if needed.         Marland Kitchen FLUoxetine (PROZAC) 20 MG tablet   Oral   Take 20 mg by mouth daily.         . metoprolol (LOPRESSOR) 50 MG tablet   Oral   Take 50 mg by mouth 2 (two) times daily.         . ranitidine (ZANTAC) 150 MG tablet   Oral   Take 150 mg by mouth 2 (two) times daily.         . simethicone (MYLICON) 80 MG chewable tablet   Oral   Chew 320 mg by mouth daily.         Marland Kitchen sulfamethoxazole-trimethoprim (BACTRIM DS) 800-160 MG per tablet   Oral   Take 1 tablet by mouth daily.         . tadalafil (CIALIS) 5 MG tablet   Oral   Take 5-10 mg by mouth daily as needed for erectile dysfunction.         . TESTOSTERONE  IM   Intramuscular   Inject into the muscle.          BP 168/108  Pulse 68  Temp(Src) 98.6 F (37 C) (Oral)  Resp 18  SpO2 97% Physical Exam  Constitutional: He is oriented to person, place, and time. He appears well-developed and well-nourished.  HENT:  Head: Normocephalic and atraumatic.  Mouth/Throat: Oropharynx is clear and moist.  Eyes: EOM are normal. Pupils are equal, round, and reactive to light. No scleral icterus.  Neck: Neck supple.  Cardiovascular: Normal rate, regular rhythm and intact distal pulses.   Pulmonary/Chest: Effort normal and breath sounds normal. No respiratory distress. He exhibits no tenderness.  Abdominal: Soft. Bowel sounds are normal. He exhibits no distension and no mass. There is no tenderness. There is no rebound and no guarding.  Musculoskeletal: Normal range of motion. He exhibits no edema.  Neurological: He is alert and oriented to person,  place, and time.  Skin: Skin is warm and dry.  Psychiatric:  Moderate anxiety    ED Course  Procedures (including critical care time) Labs Review Labs Reviewed  I-STAT CHEM 8, ED - Abnormal; Notable for the following:    Glucose, Bld 104 (*)    All other components within normal limits  CBC WITH DIFFERENTIAL   Imaging Review Dg Abd Acute W/chest  08/25/2013   CLINICAL DATA:  Recurrent lower abdominal pain and bloating.  EXAM: ACUTE ABDOMEN SERIES (ABDOMEN 2 VIEW & CHEST 1 VIEW)  COMPARISON:  None.  FINDINGS: The lungs are well-aerated and clear. There is no evidence of focal opacification, pleural effusion or pneumothorax. The cardiomediastinal silhouette is within normal limits.  The visualized bowel gas pattern is unremarkable. Scattered stool and air are seen within the colon; there is no evidence of small bowel dilatation to suggest obstruction. No free intra-abdominal air is identified on the provided upright view.  No acute osseous abnormalities are seen; the sacroiliac joints are unremarkable in appearance. An apparent clip is noted at the mid abdomen.  IMPRESSION: 1. Unremarkable bowel gas pattern; no free intra-abdominal air seen. 2. No acute cardiopulmonary process seen.   Electronically Signed   By: Garald Balding M.D.   On: 08/25/2013 05:47    Patient was given his home medications including Klonopin. Blood pressure normalized. Repeat abdominal exam remains benign without tenderness or distention.   X-ray results shared with patient as above. Plan discharge home and followup with his gastroenterologist. No indication for CT scan or further imaging at this time without tenderness or acute abdomen.  MDM   Diagnosis: Abdominal pain  Presents with cramping pain, intermittent and mostly in the mornings. Recent colonoscopy. No acute abdomen serial exams. Labs and imaging obtained and reviewed as above.  No significant pain while in the ER. Vital signs and nursing notes reviewed and  considered    Teressa Lower, MD 08/25/13 [phone]

## 2013-08-25 NOTE — Discharge Instructions (Signed)
Abdominal Pain, Adult °Many things can cause abdominal pain. Usually, abdominal pain is not caused by a disease and will improve without treatment. It can often be observed and treated at home. Your health care provider will do a physical exam and possibly order blood tests and X-rays to help determine the seriousness of your pain. However, in many cases, more time must pass before a clear cause of the pain can be found. Before that point, your health care provider may not know if you need more testing or further treatment. °HOME CARE INSTRUCTIONS  °Monitor your abdominal pain for any changes. The following actions may help to alleviate any discomfort you are experiencing: °· Only take over-the-counter or prescription medicines as directed by your health care provider. °· Do not take laxatives unless directed to do so by your health care provider. °· Try a clear liquid diet (broth, tea, or water) as directed by your health care provider. Slowly move to a bland diet as tolerated. °SEEK MEDICAL CARE IF: °· You have unexplained abdominal pain. °· You have abdominal pain associated with nausea or diarrhea. °· You have pain when you urinate or have a bowel movement. °· You experience abdominal pain that wakes you in the night. °· You have abdominal pain that is worsened or improved by eating food. °· You have abdominal pain that is worsened with eating fatty foods. °SEEK IMMEDIATE MEDICAL CARE IF:  °· Your pain does not go away within 2 hours. °· You have a fever. °· You keep throwing up (vomiting). °· Your pain is felt only in portions of the abdomen, such as the right side or the left lower portion of the abdomen. °· You pass bloody or black tarry stools. °MAKE SURE YOU: °· Understand these instructions.   °· Will watch your condition.   °· Will get help right away if you are not doing well or get worse.   °Document Released: 02/15/2005 Document Revised: 02/26/2013 Document Reviewed: 01/15/2013 °[employer]® Patient  Information ©2014 [employer]. ° °

## 2013-08-25 NOTE — ED Notes (Signed)
MD at bedside. 

## 2013-08-25 NOTE — ED Notes (Signed)
Pt took his morning BP medications per MD approval.

## 2013-09-15 ENCOUNTER — Emergency Department: Payer: Self-pay | Admitting: Emergency Medicine

## 2013-09-15 LAB — CBC
HCT: 43.8 % (ref 40.0–52.0)
HGB: 15.3 g/dL (ref 13.0–18.0)
MCH: 31.4 pg (ref 26.0–34.0)
MCHC: 35 g/dL (ref 32.0–36.0)
MCV: 90 fL (ref 80–100)
Platelet: 328 10*3/uL (ref 150–440)
RBC: 4.88 10*6/uL (ref 4.40–5.90)
RDW: 13.6 % (ref 11.5–14.5)
WBC: 11.7 10*3/uL — AB (ref 3.8–10.6)

## 2013-09-15 LAB — BASIC METABOLIC PANEL
Anion Gap: 8 (ref 7–16)
BUN: 11 mg/dL (ref 7–18)
CALCIUM: 9.1 mg/dL (ref 8.5–10.1)
CHLORIDE: 100 mmol/L (ref 98–107)
CO2: 28 mmol/L (ref 21–32)
Creatinine: 0.87 mg/dL (ref 0.60–1.30)
EGFR (African American): >60
EGFR (Non-African Amer.): >60
Glucose: 102 mg/dL — ABNORMAL HIGH (ref 65–99)
Osmolality: 272 (ref 275–301)
Potassium: 3.8 mmol/L (ref 3.5–5.1)
Sodium: 136 mmol/L (ref 136–145)

## 2013-09-15 LAB — TROPONIN I: Troponin-I: <0.02 ng/mL

## 2013-09-16 ENCOUNTER — Emergency Department: Payer: Self-pay | Admitting: Emergency Medicine

## 2013-09-17 LAB — COMPREHENSIVE METABOLIC PANEL
ALK PHOS: 59 U/L
ALT: 37 U/L (ref 12–78)
ANION GAP: 10 (ref 7–16)
Albumin: 4.1 g/dL (ref 3.4–5.0)
BILIRUBIN TOTAL: 0.4 mg/dL (ref 0.2–1.0)
BUN: 5 mg/dL — ABNORMAL LOW (ref 7–18)
CO2: 26 mmol/L (ref 21–32)
Calcium, Total: 9 mg/dL (ref 8.5–10.1)
Chloride: 99 mmol/L (ref 98–107)
Creatinine: 0.87 mg/dL (ref 0.60–1.30)
EGFR (African American): >60
EGFR (Non-African Amer.): >60
GLUCOSE: 99 mg/dL (ref 65–99)
OSMOLALITY: 267 (ref 275–301)
Potassium: 3.5 mmol/L (ref 3.5–5.1)
SGOT(AST): 32 U/L (ref 15–37)
SODIUM: 135 mmol/L — AB (ref 136–145)
Total Protein: 7.5 g/dL (ref 6.4–8.2)

## 2013-09-17 LAB — CBC
HCT: 43.3 % (ref 40.0–52.0)
HGB: 14.7 g/dL (ref 13.0–18.0)
MCH: 30.4 pg (ref 26.0–34.0)
MCHC: 33.8 g/dL (ref 32.0–36.0)
MCV: 90 fL (ref 80–100)
Platelet: 333 10*3/uL (ref 150–440)
RBC: 4.83 10*6/uL (ref 4.40–5.90)
RDW: 13.6 % (ref 11.5–14.5)
WBC: 6.8 10*3/uL (ref 3.8–10.6)

## 2013-10-19 ENCOUNTER — Emergency Department (INDEPENDENT_AMBULATORY_CARE_PROVIDER_SITE_OTHER)
Admission: EM | Admit: 2013-10-19 | Discharge: 2013-10-19 | Disposition: A | Discharge status: Home / Self Care | Payer: Self-pay | Source: Home / Self Care | Attending: Family Medicine | Admitting: Family Medicine

## 2013-10-19 ENCOUNTER — Encounter (HOSPITAL_COMMUNITY): Payer: Self-pay | Admitting: Emergency Medicine

## 2013-10-19 DIAGNOSIS — L03115 Cellulitis of right lower limb: Secondary | ICD-10-CM

## 2013-10-19 DIAGNOSIS — L03119 Cellulitis of unspecified part of limb: Secondary | ICD-10-CM

## 2013-10-19 DIAGNOSIS — L02619 Cutaneous abscess of unspecified foot: Secondary | ICD-10-CM

## 2013-10-19 MED ORDER — SULFAMETHOXAZOLE-TRIMETHOPRIM 800-160 MG PO TABS
2.0000 | ORAL_TABLET | Freq: Two times a day (BID) | ORAL | Status: DC
Start: 2013-10-19 — End: 2014-07-28

## 2013-10-19 MED ORDER — CEPHALEXIN 500 MG PO CAPS: 500.0000 mg | ORAL_CAPSULE | Freq: Four times a day (QID) | ORAL | Status: DC

## 2013-10-19 NOTE — Discharge Instructions (Signed)
Thank you for coming in today. Take both keflex and bactrim.  Come back as needed.   Cellulitis Cellulitis is an infection of the skin and the tissue beneath it. The infected area is usually red and tender. Cellulitis occurs most often in the arms and lower legs.  CAUSES  Cellulitis is caused by bacteria that enter the skin through cracks or cuts in the skin. The most common types of bacteria that cause cellulitis are Staphylococcus and Streptococcus. SYMPTOMS   Redness and warmth.  Swelling.  Tenderness or pain.  Fever. DIAGNOSIS  Your caregiver can usually determine what is wrong based on a physical exam. Blood tests may also be done. TREATMENT  Treatment usually involves taking an antibiotic medicine. HOME CARE INSTRUCTIONS   Take your antibiotics as directed. Finish them even if you start to feel better.  Keep the infected arm or leg elevated to reduce swelling.  Apply a warm cloth to the affected area up to 4 times per day to relieve pain.  Only take over-the-counter or prescription medicines for pain, discomfort, or fever as directed by your caregiver.  Keep all follow-up appointments as directed by your caregiver. SEEK MEDICAL CARE IF:   You notice red streaks coming from the infected area.  Your red area gets larger or turns dark in color.  Your bone or joint underneath the infected area becomes painful after the skin has healed.  Your infection returns in the same area or another area.  You notice a swollen bump in the infected area.  You develop new symptoms. SEEK IMMEDIATE MEDICAL CARE IF:   You have a fever.  You feel very sleepy.  You develop vomiting or diarrhea.  You have a general ill feeling (malaise) with muscle aches and pains. MAKE SURE YOU:   Understand these instructions.  Will watch your condition.  Will get help right away if you are not doing well or get worse. Document Released: 02/15/2005 Document Revised: 11/07/2011 Document  Reviewed: 07/24/2011 [facility] Patient Information 2014 [place].

## 2013-10-19 NOTE — ED Provider Notes (Signed)
Jeremiah [name] is a 48 y.o. male who presents to Urgent Care today for right lateral heel pain starting yesterday evening. Patient denies any injury. He notes prior episode of cellulitis of the right heel occurring several years ago. His symptoms are consistent with previous episodes of cellulitis. No fevers or chills nausea vomiting or diarrhea. No medications tried. He notes that the shoes that he wears have broken heel cups that tend to irritate his heels. He feels well otherwise.   Past Medical History  Diagnosis Date  . Hypertension   . Rectal fistula    History  Substance Use Topics  . Smoking status: Current Every Day Smoker  . Smokeless tobacco: Not on file  . Alcohol Use: Yes     Comment: every day or every other day (2-4  12oz cans)   ROS as above Medications: No current facility-administered medications for this encounter.   Current Outpatient Prescriptions  Medication Sig Dispense Refill  . ALPRAZolam (XANAX) 1 MG tablet Take 1 mg by mouth 3 (three) times daily.      . cephALEXin (KEFLEX) 500 MG capsule Take 1 capsule (500 mg total) by mouth 4 (four) times daily.  40 capsule  0  . cloNIDine (CATAPRES) 0.1 MG tablet Take 0.1 mg by mouth 2 (two) times daily.      Marland Kitchen docusate sodium (COLACE) 100 MG capsule Take 100 mg by mouth daily. Take 2 capsules, then repeat 1 capsule if needed.      Marland Kitchen FLUoxetine (PROZAC) 20 MG tablet Take 20 mg by mouth daily.      . metoprolol (LOPRESSOR) 50 MG tablet Take 50 mg by mouth 2 (two) times daily.      . ranitidine (ZANTAC) 150 MG tablet Take 150 mg by mouth 2 (two) times daily.      . simethicone (MYLICON) 80 MG chewable tablet Chew 320 mg by mouth daily.      Marland Kitchen sulfamethoxazole-trimethoprim (BACTRIM DS) 800-160 MG per tablet Take 1 tablet by mouth daily.      Marland Kitchen sulfamethoxazole-trimethoprim (SEPTRA DS) 800-160 MG per tablet Take 2 tablets by mouth 2 (two) times daily.  28 tablet  0  . tadalafil (CIALIS) 5 MG tablet Take 5-10 mg by mouth daily  as needed for erectile dysfunction.      . TESTOSTERONE IM Inject into the muscle.        Exam:  BP 155/89  Pulse 92  Temp(Src) 98 F (36.7 C) (Oral)  Resp 20  SpO2 100% Gen: Well NAD Right posterior foot:  Normal-appearing Achilles tendon without nodule or tenderness. There is an erythematous mildly indurated nonfluctuant patch approximately a dime in size on the right lateral heel. This is tender to touch. The foot is otherwise nontender with normal motion capillary refill sensation. Pulses are intact distally.  Limited musculoskeletal ultrasound. Achilles insertion is normal-appearing with no retrocalcaneal bursitis. The area of tenderness was visualized. The skin showed a heterogeneous pattern consistent with cellulitis. Normal bony structures.  No results found for this or any previous visit (from the past 24 hour(s)). No results found.  Assessment and Plan: 48 y.o. male with right heel cellulitis. Plan to treat with Bactrim and Keflex. Followup needed.  Discussed warning signs or symptoms. Please see discharge instructions. Patient expresses understanding.    Gregor Hams, MD 10/19/13 1240

## 2013-10-19 NOTE — ED Notes (Signed)
C/o right heel pain  States in 2011 he had an infection in the heel States area is a little warm  Denies any injuries

## 2013-10-30 ENCOUNTER — Emergency Department: Payer: Self-pay | Admitting: Emergency Medicine

## 2013-10-30 LAB — COMPREHENSIVE METABOLIC PANEL
ALBUMIN: 3.8 g/dL (ref 3.4–5.0)
ALK PHOS: 46 U/L
ALT: 43 U/L (ref 12–78)
ANION GAP: 4 — AB (ref 7–16)
BUN: 6 mg/dL — ABNORMAL LOW (ref 7–18)
Bilirubin,Total: 0.5 mg/dL (ref 0.2–1.0)
CO2: 28 mmol/L (ref 21–32)
Calcium, Total: 9.2 mg/dL (ref 8.5–10.1)
Chloride: 105 mmol/L (ref 98–107)
Creatinine: 1.28 mg/dL (ref 0.60–1.30)
Glucose: 129 mg/dL — ABNORMAL HIGH (ref 65–99)
OSMOLALITY: 273 (ref 275–301)
Potassium: 4 mmol/L (ref 3.5–5.1)
SGOT(AST): 40 U/L — ABNORMAL HIGH (ref 15–37)
SODIUM: 137 mmol/L (ref 136–145)
Total Protein: 7.2 g/dL (ref 6.4–8.2)

## 2013-10-30 LAB — CBC
HCT: 40.4 % (ref 40.0–52.0)
HGB: 13.4 g/dL (ref 13.0–18.0)
MCH: 30.7 pg (ref 26.0–34.0)
MCHC: 33.3 g/dL (ref 32.0–36.0)
MCV: 92 fL (ref 80–100)
Platelet: 352 10*3/uL (ref 150–440)
RBC: 4.38 10*6/uL — AB (ref 4.40–5.90)
RDW: 13.1 % (ref 11.5–14.5)
WBC: 5.4 10*3/uL (ref 3.8–10.6)

## 2013-10-30 LAB — PRO B NATRIURETIC PEPTIDE: B-TYPE NATIURETIC PEPTID: 72 pg/mL (ref 0–125)

## 2013-11-04 ENCOUNTER — Emergency Department (INDEPENDENT_AMBULATORY_CARE_PROVIDER_SITE_OTHER)
Admission: EM | Admit: 2013-11-04 | Discharge: 2013-11-04 | Disposition: A | Discharge status: Home / Self Care | Payer: Self-pay | Source: Home / Self Care

## 2013-11-04 ENCOUNTER — Encounter (HOSPITAL_COMMUNITY): Payer: Self-pay | Admitting: Emergency Medicine

## 2013-11-04 DIAGNOSIS — R609 Edema, unspecified: Secondary | ICD-10-CM

## 2013-11-04 DIAGNOSIS — I10 Essential (primary) hypertension: Secondary | ICD-10-CM

## 2013-11-04 DIAGNOSIS — R600 Localized edema: Secondary | ICD-10-CM

## 2013-11-04 NOTE — ED Notes (Signed)
Pt c/o bilateral foot/leg swelling onset 5/31 Reports he was seen here on 05/31; treated for cellulitis of right heel Given keflex w/temp relief Swelling increases w/long periods of standing Alert w/no signs of acute distress.

## 2013-11-04 NOTE — Discharge Instructions (Signed)
Edema See primary care doctor to have BP managed and change your medication Wear compression stockings Edema is an abnormal build-up of fluids in tissues. Because this is partly dependent on gravity (water flows to the lowest place), it is more common in the legs and thighs (lower extremities). It is also common in the looser tissues, like around the eyes. Painless swelling of the feet and ankles is common and increases as a person ages. It may affect both legs and may include the calves or even thighs. When squeezed, the fluid may move out of the affected area and may leave a dent for a few moments. CAUSES   Prolonged standing or sitting in one place for extended periods of time. Movement helps pump tissue fluid into the veins, and absence of movement prevents this, resulting in edema.  Varicose veins. The valves in the veins do not work as well as they should. This causes fluid to leak into the tissues.  Fluid and salt overload.  Injury, burn, or surgery to the leg, ankle, or foot, may damage veins and allow fluid to leak out.  Sunburn damages vessels. Leaky vessels allow fluid to go out into the sunburned tissues.  Allergies (from insect bites or stings, medications or chemicals) cause swelling by allowing vessels to become leaky.  Protein in the blood helps keep fluid in your vessels. Low protein, as in malnutrition, allows fluid to leak out.  Hormonal changes, including pregnancy and menstruation, cause fluid retention. This fluid may leak out of vessels and cause edema.  Medications that cause fluid retention. Examples are sex hormones, blood pressure medications, steroid treatment, or anti-depressants.  Some illnesses cause edema, especially heart failure, kidney disease, or liver disease.  Surgery that cuts veins or lymph nodes, such as surgery done for the heart or for breast cancer, may result in edema. DIAGNOSIS  Your caregiver is usually easily able to determine what is  causing your swelling (edema) by simply asking what is wrong (getting a history) and examining you (doing a physical). Sometimes x-rays, EKG (electrocardiogram or heart tracing), and blood work may be done to evaluate for underlying medical illness. TREATMENT  General treatment includes:  Leg elevation (or elevation of the affected body part).  Restriction of fluid intake.  Prevention of fluid overload.  Compression of the affected body part. Compression with elastic bandages or support stockings squeezes the tissues, preventing fluid from entering and forcing it back into the blood vessels.  Diuretics (also called water pills or fluid pills) pull fluid out of your body in the form of increased urination. These are effective in reducing the swelling, but can have side effects and must be used only under your caregiver's supervision. Diuretics are appropriate only for some types of edema. The specific treatment can be directed at any underlying causes discovered. Heart, liver, or kidney disease should be treated appropriately. HOME CARE INSTRUCTIONS   Elevate the legs (or affected body part) above the level of the heart, while lying down.  Avoid sitting or standing still for prolonged periods of time.  Avoid putting anything directly under the knees when lying down, and do not wear constricting clothing or garters on the upper legs.  Exercising the legs causes the fluid to work back into the veins and lymphatic channels. This may help the swelling go down.  The pressure applied by elastic bandages or support stockings can help reduce ankle swelling.  A low-salt diet may help reduce fluid retention and decrease the ankle swelling.  Take any medications exactly as prescribed. SEEK MEDICAL CARE IF:  Your edema is not responding to recommended treatments. SEEK IMMEDIATE MEDICAL CARE IF:   You develop shortness of breath or chest pain.  You cannot breathe when you lay down; or if, while  lying down, you have to get up and go to the window to get your breath.  You are having increasing swelling without relief from treatment.  You develop a fever over 102 F (38.9 C).  You develop pain or redness in the areas that are swollen.  Tell your caregiver right away if you have gained 03 lb/1.4 kg in 1 day or 05 lb/2.3 kg in a week. MAKE SURE YOU:   Understand these instructions.  Will watch your condition.  Will get help right away if you are not doing well or get worse. Document Released: 05/08/2005 Document Revised: 11/07/2011 Document Reviewed: 12/25/2007 [facility] Patient Information 2014 [place].  Hypertension Hypertension is another name for high blood pressure. High blood pressure may mean that your heart needs to work harder to pump blood. Blood pressure consists of two numbers, which includes a higher number over a lower number (example: 110/72). HOME CARE   Make lifestyle changes as told by your doctor. This may include weight loss and exercise.  Take your blood pressure medicine every day.  Limit how much salt you use.  Stop smoking if you smoke.  Do not use drugs.  Talk to your doctor if you are using decongestants or birth control pills. These medicines might make blood pressure higher.  Females should not drink more than 1 alcoholic drink per day. Males should not drink more than 2 alcoholic drinks per day.  See your doctor as told. GET HELP RIGHT AWAY IF:   You have a blood pressure reading with a top number of 180 or higher.  You get a very bad headache.  You get blurred or changing vision.  You feel confused.  You feel weak, numb, or faint.  You get chest or belly (abdominal) pain.  You throw up (vomit).  You cannot breathe very well. MAKE SURE YOU:   Understand these instructions.  Will watch your condition.  Will get help right away if you are not doing well or get worse. Document Released: 10/25/2007 Document Revised:  07/31/2011 Document Reviewed: 10/25/2007 [facility] Patient Information 2014 [place], [place].  Peripheral Edema You have swelling in your legs (peripheral edema). This swelling is due to excess accumulation of salt and water in your body. Edema may be a sign of heart, kidney or liver disease, or a side effect of a medication. It may also be due to problems in the leg veins. Elevating your legs and using special support stockings may be very helpful, if the cause of the swelling is due to poor venous circulation. Avoid long periods of standing, whatever the cause. Treatment of edema depends on identifying the cause. Chips, pretzels, pickles and other salty foods should be avoided. Restricting salt in your diet is almost always needed. Water pills (diuretics) are often used to remove the excess salt and water from your body via urine. These medicines prevent the kidney from reabsorbing sodium. This increases urine flow. Diuretic treatment may also result in lowering of potassium levels in your body. Potassium supplements may be needed if you have to use diuretics daily. Daily weights can help you keep track of your progress in clearing your edema. You should call your caregiver for follow up care as recommended. SEEK  IMMEDIATE MEDICAL CARE IF:   You have increased swelling, pain, redness, or heat in your legs.  You develop shortness of breath, especially when lying down.  You develop chest or abdominal pain, weakness, or fainting.  You have a fever. Document Released: 06/15/2004 Document Revised: 07/31/2011 Document Reviewed: 05/26/2009 [facility] Patient Information 2014 [place].

## 2013-11-04 NOTE — ED Provider Notes (Signed)
CSN: [id number]     Arrival date & time 11/04/13  1305 History   First MD Initiated Contact with Patient 11/04/13 1436     Chief Complaint  Patient presents with  . Leg Swelling   (Consider location/radiation/quality/duration/timing/severity/associated sxs/prior Treatment) HPI Comments: 48 year old male presents with a complaint of bilateral lower extremity pitting edema. It occurs after prolonged standing primarily over 4 hours. He has never had this before. He was recently diagnosed with cellulitis of the right heel but that has since resolved. He is taking Norvasc which has a side effect of lower extremity edema. He claims to have no PCP but is on several medications including testosterone injections. He will not advise to provide him with that injection but states it is not a physician.   Past Medical History  Diagnosis Date  . Hypertension   . Rectal fistula    Past Surgical History  Procedure Laterality Date  . Lumbar spine surgery    . Colonoscopy     No family history on file. History  Substance Use Topics  . Smoking status: Current Every Day Smoker  . Smokeless tobacco: Not on file  . Alcohol Use: Yes     Comment: every day or every other day (2-4  12oz cans)    Review of Systems  Constitutional: Negative for fever and activity change.  Respiratory: Negative.   Cardiovascular: Negative.   Musculoskeletal: Negative.   Skin: Negative for color change and rash.  Neurological: Negative for dizziness, speech difficulty and weakness.    Allergies  Milk-related compounds  Home Medications   Prior to Admission medications   Medication Sig Start Date End Date Taking? Authorizing Provider  cephALEXin (KEFLEX) 500 MG capsule Take 1 capsule (500 mg total) by mouth 4 (four) times daily. 10/19/13  Yes Gregor Hams, MD  cloNIDine (CATAPRES) 0.1 MG tablet Take 0.1 mg by mouth 2 (two) times daily.   Yes Historical Provider, MD  FLUoxetine (PROZAC) 20 MG tablet Take 20 mg by  mouth daily.   Yes Historical Provider, MD  metoprolol (LOPRESSOR) 50 MG tablet Take 50 mg by mouth 2 (two) times daily.   Yes Historical Provider, MD  ranitidine (ZANTAC) 150 MG tablet Take 150 mg by mouth 2 (two) times daily.   Yes Historical Provider, MD  ALPRAZolam Duanne Moron) 1 MG tablet Take 1 mg by mouth 3 (three) times daily.    Historical Provider, MD  docusate sodium (COLACE) 100 MG capsule Take 100 mg by mouth daily. Take 2 capsules, then repeat 1 capsule if needed.    Historical Provider, MD  simethicone (MYLICON) 80 MG chewable tablet Chew 320 mg by mouth daily.    Historical Provider, MD  sulfamethoxazole-trimethoprim (BACTRIM DS) 800-160 MG per tablet Take 1 tablet by mouth daily.    Historical Provider, MD  sulfamethoxazole-trimethoprim (SEPTRA DS) 800-160 MG per tablet Take 2 tablets by mouth 2 (two) times daily. 10/19/13   Gregor Hams, MD  tadalafil (CIALIS) 5 MG tablet Take 5-10 mg by mouth daily as needed for erectile dysfunction.    Historical Provider, MD  TESTOSTERONE IM Inject into the muscle.    Historical Provider, MD   BP 107/71  Pulse 67  Temp(Src) 97.9 F (36.6 C) (Oral)  Resp 16  SpO2 97% Physical Exam  Nursing note and vitals reviewed. Constitutional: He is oriented to person, place, and time. He appears well-developed and well-nourished. No distress.  Cardiovascular: Normal rate.   Pulmonary/Chest: Effort normal. No respiratory distress.  Musculoskeletal:  Normal range of motion. He exhibits no edema and no tenderness.  Bilateral lower extremities without edema now. Pedal pulses 2+. No discoloration or erythema. There is tenderness to a single point of the right Achilles tendon. No discoloration or erythema or swelling at that point. N/V, M/S intact. Good circulation and movement.   Neurological: He is alert and oriented to person, place, and time. He exhibits normal muscle tone.  Skin: Skin is warm and dry.  Psychiatric: His speech is tangential.  Patient's  speech is rapid and interrupted. He frequently changes the subject . He asked questions that has just been answered. He repeats the same complaints over and over.    ED Course  Procedures (including critical care time) Labs Review Labs Reviewed - No data to display  Imaging Review No results found.   MDM   1. Bilateral lower extremity edema   2. HTN (hypertension)    Compression stockings Elevate legs Have your PCP ;change your med from amlodipine       Janne Napoleon, NP 11/04/13 1505

## 2013-11-07 NOTE — ED Provider Notes (Signed)
Medical screening examination/treatment/procedure(s) were performed by resident physician or non-physician practitioner and as supervising physician I was immediately available for consultation/collaboration.   Pauline Good MD.   Billy Fischer, MD 11/07/13 [phone]

## 2013-12-05 DIAGNOSIS — E876 Hypokalemia: Secondary | ICD-10-CM | POA: Insufficient documentation

## 2013-12-05 DIAGNOSIS — E291 Testicular hypofunction: Secondary | ICD-10-CM | POA: Insufficient documentation

## 2013-12-27 ENCOUNTER — Emergency Department: Payer: Self-pay | Admitting: Emergency Medicine

## 2013-12-27 LAB — BASIC METABOLIC PANEL
Anion Gap: 10 (ref 7–16)
BUN: 13 mg/dL (ref 7–18)
CALCIUM: 9.4 mg/dL (ref 8.5–10.1)
CHLORIDE: 105 mmol/L (ref 98–107)
Co2: 25 mmol/L (ref 21–32)
Creatinine: 1.03 mg/dL (ref 0.60–1.30)
EGFR (African American): >60
EGFR (Non-African Amer.): >60
GLUCOSE: 117 mg/dL — AB (ref 65–99)
Osmolality: 281 (ref 275–301)
POTASSIUM: 3.4 mmol/L — AB (ref 3.5–5.1)
SODIUM: 140 mmol/L (ref 136–145)

## 2013-12-27 LAB — URINALYSIS, COMPLETE
BACTERIA: NONE SEEN
Bilirubin,UR: NEGATIVE
Glucose,UR: NEGATIVE mg/dL (ref 0–75)
KETONE: NEGATIVE
Leukocyte Esterase: NEGATIVE
Nitrite: NEGATIVE
PROTEIN: NEGATIVE
Ph: 7 (ref 4.5–8.0)
RBC,UR: 1 /HPF (ref 0–5)
Specific Gravity: 1.004 (ref 1.003–1.030)
Squamous Epithelial: NONE SEEN
WBC UR: NONE SEEN /HPF (ref 0–5)

## 2013-12-27 LAB — CBC
HCT: 40.6 % (ref 40.0–52.0)
HGB: 14.3 g/dL (ref 13.0–18.0)
MCH: 30.8 pg (ref 26.0–34.0)
MCHC: 35.2 g/dL (ref 32.0–36.0)
MCV: 87 fL (ref 80–100)
Platelet: 329 10*3/uL (ref 150–440)
RBC: 4.65 10*6/uL (ref 4.40–5.90)
RDW: 12.3 % (ref 11.5–14.5)
WBC: 5.4 10*3/uL (ref 3.8–10.6)

## 2013-12-27 LAB — DRUG SCREEN, URINE
AMPHETAMINES, UR SCREEN: NEGATIVE (ref ?–1000)
Barbiturates, Ur Screen: NEGATIVE (ref ?–200)
Benzodiazepine, Ur Scrn: POSITIVE (ref ?–200)
CANNABINOID 50 NG, UR ~~LOC~~: NEGATIVE (ref ?–50)
COCAINE METABOLITE, UR ~~LOC~~: NEGATIVE (ref ?–300)
MDMA (ECSTASY) UR SCREEN: NEGATIVE (ref ?–500)
METHADONE, UR SCREEN: NEGATIVE (ref ?–300)
Opiate, Ur Screen: NEGATIVE (ref ?–300)
PHENCYCLIDINE (PCP) UR S: NEGATIVE (ref ?–25)
TRICYCLIC, UR SCREEN: NEGATIVE (ref ?–1000)

## 2014-01-01 ENCOUNTER — Ambulatory Visit: Payer: Self-pay | Admitting: Urology

## 2014-01-07 ENCOUNTER — Emergency Department: Payer: Self-pay | Admitting: Emergency Medicine

## 2014-01-07 LAB — URINALYSIS, COMPLETE
BACTERIA: NONE SEEN
Bilirubin,UR: NEGATIVE
GLUCOSE, UR: NEGATIVE mg/dL (ref 0–75)
Ketone: NEGATIVE
Leukocyte Esterase: NEGATIVE
NITRITE: NEGATIVE
PROTEIN: NEGATIVE
Ph: 6 (ref 4.5–8.0)
SPECIFIC GRAVITY: 1.002 (ref 1.003–1.030)
Squamous Epithelial: NONE SEEN
WBC UR: <1 /HPF (ref 0–5)

## 2014-01-07 LAB — CBC WITH DIFFERENTIAL/PLATELET
Basophil #: 0.1 10*3/uL (ref 0.0–0.1)
Basophil %: 0.9 %
EOS PCT: 1.3 %
Eosinophil #: 0.1 10*3/uL (ref 0.0–0.7)
HCT: 40.5 % (ref 40.0–52.0)
HGB: 13.8 g/dL (ref 13.0–18.0)
Lymphocyte #: 1.9 10*3/uL (ref 1.0–3.6)
Lymphocyte %: 24.2 %
MCH: 30 pg (ref 26.0–34.0)
MCHC: 34 g/dL (ref 32.0–36.0)
MCV: 88 fL (ref 80–100)
Monocyte #: 0.7 x10 3/mm (ref 0.2–1.0)
Monocyte %: 8.9 %
Neutrophil #: 5.1 10*3/uL (ref 1.4–6.5)
Neutrophil %: 64.7 %
PLATELETS: 333 10*3/uL (ref 150–440)
RBC: 4.59 10*6/uL (ref 4.40–5.90)
RDW: 12.4 % (ref 11.5–14.5)
WBC: 7.8 10*3/uL (ref 3.8–10.6)

## 2014-01-07 LAB — BASIC METABOLIC PANEL
ANION GAP: 10 (ref 7–16)
BUN: 13 mg/dL (ref 7–18)
Calcium, Total: 9.6 mg/dL (ref 8.5–10.1)
Chloride: 102 mmol/L (ref 98–107)
Co2: 25 mmol/L (ref 21–32)
Creatinine: 1.14 mg/dL (ref 0.60–1.30)
EGFR (African American): >60
GLUCOSE: 91 mg/dL (ref 65–99)
OSMOLALITY: 274 (ref 275–301)
Potassium: 3.2 mmol/L — ABNORMAL LOW (ref 3.5–5.1)
Sodium: 137 mmol/L (ref 136–145)

## 2014-01-07 LAB — TROPONIN I

## 2014-01-16 ENCOUNTER — Ambulatory Visit: Payer: Self-pay | Admitting: Urology

## 2014-01-24 ENCOUNTER — Emergency Department: Payer: Self-pay | Admitting: Emergency Medicine

## 2014-01-24 LAB — DRUG SCREEN, URINE
Amphetamines, Ur Screen: NEGATIVE (ref ?–1000)
Barbiturates, Ur Screen: NEGATIVE (ref ?–200)
Benzodiazepine, Ur Scrn: POSITIVE (ref ?–200)
COCAINE METABOLITE, UR ~~LOC~~: NEGATIVE (ref ?–300)
Cannabinoid 50 Ng, Ur ~~LOC~~: NEGATIVE (ref ?–50)
MDMA (ECSTASY) UR SCREEN: NEGATIVE (ref ?–500)
Methadone, Ur Screen: NEGATIVE (ref ?–300)
Opiate, Ur Screen: NEGATIVE (ref ?–300)
Phencyclidine (PCP) Ur S: NEGATIVE (ref ?–25)
TRICYCLIC, UR SCREEN: NEGATIVE (ref ?–1000)

## 2014-01-24 LAB — CBC
HCT: 42.4 % (ref 40.0–52.0)
HGB: 14.7 g/dL (ref 13.0–18.0)
MCH: 30.2 pg (ref 26.0–34.0)
MCHC: 34.7 g/dL (ref 32.0–36.0)
MCV: 87 fL (ref 80–100)
PLATELETS: 360 10*3/uL (ref 150–440)
RBC: 4.86 10*6/uL (ref 4.40–5.90)
RDW: 12.7 % (ref 11.5–14.5)
WBC: 9.7 10*3/uL (ref 3.8–10.6)

## 2014-01-24 LAB — BASIC METABOLIC PANEL
Anion Gap: 8 (ref 7–16)
BUN: 11 mg/dL (ref 7–18)
CALCIUM: 10 mg/dL (ref 8.5–10.1)
Chloride: 99 mmol/L (ref 98–107)
Co2: 30 mmol/L (ref 21–32)
Creatinine: 1.21 mg/dL (ref 0.60–1.30)
EGFR (African American): >60
EGFR (Non-African Amer.): >60
Glucose: 118 mg/dL — ABNORMAL HIGH (ref 65–99)
OSMOLALITY: 274 (ref 275–301)
Potassium: 3.5 mmol/L (ref 3.5–5.1)
SODIUM: 137 mmol/L (ref 136–145)

## 2014-01-24 LAB — ETHANOL

## 2014-01-24 LAB — URINALYSIS, COMPLETE
BILIRUBIN, UR: NEGATIVE
Bacteria: NONE SEEN
GLUCOSE, UR: NEGATIVE mg/dL (ref 0–75)
KETONE: NEGATIVE
Leukocyte Esterase: NEGATIVE
Nitrite: NEGATIVE
Ph: 7 (ref 4.5–8.0)
Protein: NEGATIVE
SPECIFIC GRAVITY: 1.002 (ref 1.003–1.030)
Squamous Epithelial: NONE SEEN
WBC UR: NONE SEEN /HPF (ref 0–5)

## 2014-01-24 LAB — SALICYLATE LEVEL: SALICYLATES, SERUM: 2.7 mg/dL

## 2014-01-24 LAB — ACETAMINOPHEN LEVEL

## 2014-01-24 LAB — TROPONIN I

## 2014-01-30 ENCOUNTER — Ambulatory Visit: Payer: Self-pay | Admitting: Obstetrics and Gynecology

## 2014-03-05 ENCOUNTER — Emergency Department: Payer: Self-pay | Admitting: Emergency Medicine

## 2014-03-05 LAB — CBC WITH DIFFERENTIAL/PLATELET
BASOS PCT: 0.7 %
Basophil #: 0.1 10*3/uL (ref 0.0–0.1)
EOS ABS: 0.1 10*3/uL (ref 0.0–0.7)
Eosinophil %: 0.7 %
HCT: 43.6 % (ref 40.0–52.0)
HGB: 15 g/dL (ref 13.0–18.0)
LYMPHS ABS: 1.8 10*3/uL (ref 1.0–3.6)
LYMPHS PCT: 20.9 %
MCH: 30.4 pg (ref 26.0–34.0)
MCHC: 34.4 g/dL (ref 32.0–36.0)
MCV: 89 fL (ref 80–100)
MONOS PCT: 9.9 %
Monocyte #: 0.8 x10 3/mm (ref 0.2–1.0)
NEUTROS PCT: 67.8 %
Neutrophil #: 5.8 10*3/uL (ref 1.4–6.5)
Platelet: 427 10*3/uL (ref 150–440)
RBC: 4.92 10*6/uL (ref 4.40–5.90)
RDW: 14 % (ref 11.5–14.5)
WBC: 8.5 10*3/uL (ref 3.8–10.6)

## 2014-03-05 LAB — BASIC METABOLIC PANEL
Anion Gap: 11 (ref 7–16)
BUN: 5 mg/dL — ABNORMAL LOW (ref 7–18)
CHLORIDE: 101 mmol/L (ref 98–107)
CO2: 26 mmol/L (ref 21–32)
Calcium, Total: 9.9 mg/dL (ref 8.5–10.1)
Creatinine: 1.01 mg/dL (ref 0.60–1.30)
EGFR (Non-African Amer.): >60
Glucose: 116 mg/dL — ABNORMAL HIGH (ref 65–99)
Osmolality: 274 (ref 275–301)
Potassium: 3.1 mmol/L — ABNORMAL LOW (ref 3.5–5.1)
Sodium: 138 mmol/L (ref 136–145)

## 2014-03-05 LAB — TROPONIN I

## 2014-03-07 ENCOUNTER — Encounter (HOSPITAL_COMMUNITY): Payer: Self-pay | Admitting: Emergency Medicine

## 2014-03-07 ENCOUNTER — Emergency Department (HOSPITAL_COMMUNITY)
Admission: EM | Admit: 2014-03-07 | Discharge: 2014-03-07 | Disposition: A | Discharge status: Home / Self Care | Payer: Self-pay | Attending: Emergency Medicine | Admitting: Emergency Medicine

## 2014-03-07 ENCOUNTER — Emergency Department (HOSPITAL_COMMUNITY): Payer: Self-pay

## 2014-03-07 DIAGNOSIS — K5730 Diverticulosis of large intestine without perforation or abscess without bleeding: Secondary | ICD-10-CM | POA: Insufficient documentation

## 2014-03-07 DIAGNOSIS — R1084 Generalized abdominal pain: Secondary | ICD-10-CM | POA: Insufficient documentation

## 2014-03-07 DIAGNOSIS — Z720 Tobacco use: Secondary | ICD-10-CM | POA: Insufficient documentation

## 2014-03-07 DIAGNOSIS — I10 Essential (primary) hypertension: Secondary | ICD-10-CM | POA: Insufficient documentation

## 2014-03-07 DIAGNOSIS — Z792 Long term (current) use of antibiotics: Secondary | ICD-10-CM | POA: Insufficient documentation

## 2014-03-07 DIAGNOSIS — Z79899 Other long term (current) drug therapy: Secondary | ICD-10-CM | POA: Insufficient documentation

## 2014-03-07 DIAGNOSIS — Z9889 Other specified postprocedural states: Secondary | ICD-10-CM | POA: Insufficient documentation

## 2014-03-07 DIAGNOSIS — K5790 Diverticulosis of intestine, part unspecified, without perforation or abscess without bleeding: Secondary | ICD-10-CM

## 2014-03-07 DIAGNOSIS — K5900 Constipation, unspecified: Secondary | ICD-10-CM | POA: Insufficient documentation

## 2014-03-07 LAB — CBC WITH DIFFERENTIAL/PLATELET
Basophils Absolute: 0.1 10*3/uL (ref 0.0–0.1)
Basophils Relative: 1 % (ref 0–1)
EOS ABS: 0.1 10*3/uL (ref 0.0–0.7)
Eosinophils Relative: 1 % (ref 0–5)
HCT: 41.9 % (ref 39.0–52.0)
HEMOGLOBIN: 14.6 g/dL (ref 13.0–17.0)
LYMPHS ABS: 2.6 10*3/uL (ref 0.7–4.0)
Lymphocytes Relative: 29 % (ref 12–46)
MCH: 29.9 pg (ref 26.0–34.0)
MCHC: 34.8 g/dL (ref 30.0–36.0)
MCV: 85.7 fL (ref 78.0–100.0)
MONOS PCT: 6 % (ref 3–12)
Monocytes Absolute: 0.6 10*3/uL (ref 0.1–1.0)
NEUTROS ABS: 5.7 10*3/uL (ref 1.7–7.7)
NEUTROS PCT: 63 % (ref 43–77)
PLATELETS: 391 10*3/uL (ref 150–400)
RBC: 4.89 MIL/uL (ref 4.22–5.81)
RDW: 13.1 % (ref 11.5–15.5)
WBC: 9 10*3/uL (ref 4.0–10.5)

## 2014-03-07 LAB — BASIC METABOLIC PANEL
Anion gap: 14 (ref 5–15)
BUN: 7 mg/dL (ref 6–23)
CHLORIDE: 94 meq/L — AB (ref 96–112)
CO2: 26 mEq/L (ref 19–32)
Calcium: 10.4 mg/dL (ref 8.4–10.5)
Creatinine, Ser: 0.93 mg/dL (ref 0.50–1.35)
GLUCOSE: 104 mg/dL — AB (ref 70–99)
POTASSIUM: 4 meq/L (ref 3.7–5.3)
SODIUM: 134 meq/L — AB (ref 137–147)

## 2014-03-07 MED ORDER — IOHEXOL 300 MG/ML  SOLN
100.0000 mL | Freq: Once | INTRAMUSCULAR | Status: AC | PRN
  Administered 2014-03-07: 100 mL via INTRAVENOUS

## 2014-03-07 NOTE — ED Provider Notes (Signed)
CSN: [id number]     Arrival date & time 03/07/14  1639 History   First MD Initiated Contact with Patient 03/07/14 1828     Chief Complaint  Patient presents with  . Abdominal Pain     (Consider location/radiation/quality/duration/timing/severity/associated sxs/prior Treatment) The history is provided by the patient.    Jeremiah [name] is a 48 y.o. male who presents for evaluation of abdominal pain. The pain waxes and wanes. It has been present for several weeks. Currently the pain is gone. He had a colonoscopy done. This year that showed diverticulosis. He's not currently able to see his gastroenterologist because of payments owed. He has chronic constipation, for which he has to manually disimpact himself. He uses a sex toy, to do that. He occasionally has rectal bleeding, and has had an anal fissure in the past. He denies fever, chills, nausea or vomiting. No dysuria, back, pain, weakness, or dizziness. He feels tired because he does not get enough sleep. There are no other known modifying factors.   Past Medical History  Diagnosis Date  . Hypertension   . Rectal fistula    Past Surgical History  Procedure Laterality Date  . Lumbar spine surgery    . Colonoscopy     History reviewed. No pertinent family history. History  Substance Use Topics  . Smoking status: Current Every Day Smoker  . Smokeless tobacco: Not on file  . Alcohol Use: Yes     Comment: every day or every other day (2-4  12oz cans)    Review of Systems  All other systems reviewed and are negative.     Allergies  Milk-related compounds  Home Medications   Prior to Admission medications   Medication Sig Start Date End Date Taking? Authorizing Provider  ALPRAZolam Duanne Moron) 1 MG tablet Take 1 mg by mouth 3 (three) times daily.    Historical Provider, MD  cephALEXin (KEFLEX) 500 MG capsule Take 1 capsule (500 mg total) by mouth 4 (four) times daily. 10/19/13   Gregor Hams, MD  cloNIDine (CATAPRES) 0.1 MG  tablet Take 0.1 mg by mouth 2 (two) times daily.    Historical Provider, MD  docusate sodium (COLACE) 100 MG capsule Take 100 mg by mouth daily. Take 2 capsules, then repeat 1 capsule if needed.    Historical Provider, MD  FLUoxetine (PROZAC) 20 MG tablet Take 20 mg by mouth daily.    Historical Provider, MD  metoprolol (LOPRESSOR) 50 MG tablet Take 50 mg by mouth 2 (two) times daily.    Historical Provider, MD  ranitidine (ZANTAC) 150 MG tablet Take 150 mg by mouth 2 (two) times daily.    Historical Provider, MD  simethicone (MYLICON) 80 MG chewable tablet Chew 320 mg by mouth daily.    Historical Provider, MD  sulfamethoxazole-trimethoprim (BACTRIM DS) 800-160 MG per tablet Take 1 tablet by mouth daily.    Historical Provider, MD  sulfamethoxazole-trimethoprim (SEPTRA DS) 800-160 MG per tablet Take 2 tablets by mouth 2 (two) times daily. 10/19/13   Gregor Hams, MD  tadalafil (CIALIS) 5 MG tablet Take 5-10 mg by mouth daily as needed for erectile dysfunction.    Historical Provider, MD  TESTOSTERONE IM Inject into the muscle.    Historical Provider, MD   BP 132/85  Pulse 72  Temp(Src) 97.8 F (36.6 C) (Oral)  Resp 18  SpO2 97% Physical Exam  Nursing note and vitals reviewed. Constitutional: He is oriented to person, place, and time. He appears well-developed and well-nourished.  HENT:  Head: Normocephalic and atraumatic.  Right Ear: External ear normal.  Left Ear: External ear normal.  Eyes: Conjunctivae and EOM are normal. Pupils are equal, round, and reactive to light.  Neck: Normal range of motion and phonation normal. Neck supple.  Cardiovascular: Normal rate, regular rhythm and normal heart sounds.   Pulmonary/Chest: Effort normal and breath sounds normal. He exhibits no bony tenderness.  Abdominal: Soft. He exhibits no distension. There is no tenderness. There is no guarding.  Musculoskeletal: Normal range of motion.  Neurological: He is alert and oriented to person, place, and  time. No cranial nerve deficit or sensory deficit. He exhibits normal muscle tone. Coordination normal.  Skin: Skin is warm, dry and intact.  Psychiatric: He has a normal mood and affect. His behavior is normal. Judgment and thought content normal.  He is calm, and cooperative    ED Course  Procedures (including critical care time)  Medications  iohexol (OMNIPAQUE) 300 MG/ML solution 100 mL (100 mLs Intravenous Contrast Given 03/07/14 1930)    Patient Vitals for the past 24 hrs:  BP Temp Temp src Pulse Resp SpO2  03/07/14 2054 114/80 mmHg - - 76 - 99 %  03/07/14 1954 180/79 mmHg 97.9 F (36.6 C) - 81 20 98 %  03/07/14 1854 132/85 mmHg - - 72 18 97 %  03/07/14 1645 149/100 mmHg 97.8 F (36.6 C) Oral 97 18 97 %    At discharge- Reevaluation with update and discussion. After initial assessment and treatment, an updated evaluation reveals he remained comfortable and had no further complaints. Findings discussed with patient, questions answered. Leaner Morici L    Labs Review Labs Reviewed  BASIC METABOLIC PANEL - Abnormal; Notable for the following:    Sodium 134 (*)    Chloride 94 (*)    Glucose, Bld 104 (*)    All other components within normal limits  CBC WITH DIFFERENTIAL    Imaging Review No results found.   EKG Interpretation None      MDM   Final diagnoses:  None    Nonspecific abdominal pain, with reassuring CT evaluation. Doubt diverticulitis, colitis, metabolic instability, or serious bacterial infection  Nursing Notes Reviewed/ Care Coordinated Applicable Imaging Reviewed Interpretation of Laboratory Data incorporated into ED treatment  The patient appears reasonably screened and/or stabilized for discharge and I doubt any other medical condition or other [facility] requiring further screening, evaluation, or treatment in the ED at this time prior to discharge.  Plan: Home Medications- none; Home Treatments- rest; return here if the recommended treatment,  does not improve the symptoms; Recommended follow up- GI followup as soon as possible    Richarda Blade, MD 03/08/14 [phone]

## 2014-03-07 NOTE — ED Notes (Signed)
The pt is here for itching in his rectum or colon since he had a colonoscopy in march when he was diagnosed with diverticulosis

## 2014-03-07 NOTE — ED Notes (Signed)
Pt recently dx with diverticulitis. sts sharp burning pain in abdomen. sts some constipation. sts he has been disimpacting himself.

## 2014-03-07 NOTE — ED Notes (Signed)
Drinking oral contrast 

## 2014-03-07 NOTE — ED Notes (Signed)
Pt returned from [place] being cold and shivering.  Warm blankets placed and temp checked

## 2014-03-07 NOTE — Discharge Instructions (Signed)
Diverticulosis Diverticulosis is the condition that develops when small pouches (diverticula) form in the wall of your colon. Your colon, or large intestine, is where water is absorbed and stool is formed. The pouches form when the inside layer of your colon pushes through weak spots in the outer layers of your colon. CAUSES  No one knows exactly what causes diverticulosis. RISK FACTORS  Being older than 33. Your risk for this condition increases with age. Diverticulosis is rare in people younger than 40 years. By age 53, almost everyone has it.  Eating a low-fiber diet.  Being frequently constipated.  Being overweight.  Not getting enough exercise.  Smoking.  Taking over-the-counter pain medicines, like aspirin and ibuprofen. SYMPTOMS  Most people with diverticulosis do not have symptoms. DIAGNOSIS  Because diverticulosis often has no symptoms, health care providers often discover the condition during an exam for other colon problems. In many cases, a health care provider will diagnose diverticulosis while using a flexible scope to examine the colon (colonoscopy). TREATMENT  If you have never developed an infection related to diverticulosis, you may not need treatment. If you have had an infection before, treatment may include:  Eating more fruits, vegetables, and grains.  Taking a fiber supplement.  Taking a live bacteria supplement (probiotic).  Taking medicine to relax your colon. HOME CARE INSTRUCTIONS   Drink at least 6-8 glasses of water each day to prevent constipation.  Try not to strain when you have a bowel movement.  Keep all follow-up appointments. If you have had an infection before:  Increase the fiber in your diet as directed by your health care provider or dietitian.  Take a dietary fiber supplement if your health care provider approves.  Only take medicines as directed by your health care provider. SEEK MEDICAL CARE IF:   You have abdominal  pain.  You have bloating.  You have cramps.  You have not gone to the bathroom in 3 days. SEEK IMMEDIATE MEDICAL CARE IF:   Your pain gets worse.  Yourbloating becomes very bad.  You have a fever or chills, and your symptoms suddenly get worse.  You begin vomiting.  You have bowel movements that are bloody or black. MAKE SURE YOU:  Understand these instructions.  Will watch your condition.  Will get help right away if you are not doing well or get worse. Document Released: 02/03/2004 Document Revised: 05/13/2013 Document Reviewed: 04/02/2013 [facility] Patient Information 2015 [place], [place]. This information is not intended to replace advice given to you by your health care provider. Make sure you discuss any questions you have with your health care provider.  Abdominal Pain Many things can cause abdominal pain. Usually, abdominal pain is not caused by a disease and will improve without treatment. It can often be observed and treated at home. Your health care provider will do a physical exam and possibly order blood tests and X-rays to help determine the seriousness of your pain. However, in many cases, more time must pass before a clear cause of the pain can be found. Before that point, your health care provider may not know if you need more testing or further treatment. HOME CARE INSTRUCTIONS  Monitor your abdominal pain for any changes. The following actions may help to alleviate any discomfort you are experiencing:  Only take over-the-counter or prescription medicines as directed by your health care provider.  Do not take laxatives unless directed to do so by your health care provider.  Try a clear liquid diet (broth,  tea, or water) as directed by your health care provider. Slowly move to a bland diet as tolerated. SEEK MEDICAL CARE IF:  You have unexplained abdominal pain.  You have abdominal pain associated with nausea or diarrhea.  You have pain when you urinate or  have a bowel movement.  You experience abdominal pain that wakes you in the night.  You have abdominal pain that is worsened or improved by eating food.  You have abdominal pain that is worsened with eating fatty foods.  You have a fever. SEEK IMMEDIATE MEDICAL CARE IF:   Your pain does not go away within 2 hours.  You keep throwing up (vomiting).  Your pain is felt only in portions of the abdomen, such as the right side or the left lower portion of the abdomen.  You pass bloody or black tarry stools. MAKE SURE YOU:  Understand these instructions.   Will watch your condition.   Will get help right away if you are not doing well or get worse.  Document Released: 02/15/2005 Document Revised: 05/13/2013 Document Reviewed: 01/15/2013 [facility] Patient Information 2015 [place], [place]. This information is not intended to replace advice given to you by your health care provider. Make sure you discuss any questions you have with your health care provider.  Constipation Constipation is when a person has fewer than three bowel movements a week, has difficulty having a bowel movement, or has stools that are dry, hard, or larger than normal. As people grow older, constipation is more common. If you try to fix constipation with medicines that make you have a bowel movement (laxatives), the problem may get worse. Long-term laxative use may cause the muscles of the colon to become weak. A low-fiber diet, not taking in enough fluids, and taking certain medicines may make constipation worse.  CAUSES   Certain medicines, such as antidepressants, pain medicine, iron supplements, antacids, and water pills.   Certain diseases, such as diabetes, irritable bowel syndrome (IBS), thyroid disease, or depression.   Not drinking enough water.   Not eating enough fiber-rich foods.   Stress or travel.   Lack of physical activity or exercise.   Ignoring the urge to have a bowel movement.    Using laxatives too much.  SIGNS AND SYMPTOMS   Having fewer than three bowel movements a week.   Straining to have a bowel movement.   Having stools that are hard, dry, or larger than normal.   Feeling full or bloated.   Pain in the lower abdomen.   Not feeling relief after having a bowel movement.  DIAGNOSIS  Your health care provider will take a medical history and perform a physical exam. Further testing may be done for severe constipation. Some tests may include:  A barium enema X-ray to examine your rectum, colon, and, sometimes, your small intestine.   A sigmoidoscopy to examine your lower colon.   A colonoscopy to examine your entire colon. TREATMENT  Treatment will depend on the severity of your constipation and what is causing it. Some dietary treatments include drinking more fluids and eating more fiber-rich foods. Lifestyle treatments may include regular exercise. If these diet and lifestyle recommendations do not help, your health care provider may recommend taking over-the-counter laxative medicines to help you have bowel movements. Prescription medicines may be prescribed if over-the-counter medicines do not work.  HOME CARE INSTRUCTIONS   Eat foods that have a lot of fiber, such as fruits, vegetables, whole grains, and beans.  Limit foods high  in fat and processed sugars, such as french fries, hamburgers, cookies, candies, and soda.   A fiber supplement may be added to your diet if you cannot get enough fiber from foods.   Drink enough fluids to keep your urine clear or pale yellow.   Exercise regularly or as directed by your health care provider.   Go to the restroom when you have the urge to go. Do not hold it.   Only take over-the-counter or prescription medicines as directed by your health care provider. Do not take other medicines for constipation without talking to your health care provider first.  [place] IF:    You have bright red blood in your stool.   Your constipation lasts for more than 4 days or gets worse.   You have abdominal or rectal pain.   You have thin, pencil-like stools.   You have unexplained weight loss. MAKE SURE YOU:   Understand these instructions.  Will watch your condition.  Will get help right away if you are not doing well or get worse. Document Released: 02/04/2004 Document Revised: 05/13/2013 Document Reviewed: 02/17/2013 [facility] Patient Information 2015 [place], [place]. This information is not intended to replace advice given to you by your health care provider. Make sure you discuss any questions you have with your health care provider.

## 2014-03-24 ENCOUNTER — Emergency Department: Payer: Self-pay | Admitting: Emergency Medicine

## 2014-04-05 ENCOUNTER — Emergency Department: Payer: Self-pay | Admitting: Emergency Medicine

## 2014-04-05 LAB — CBC WITH DIFFERENTIAL/PLATELET
BASOS ABS: 0.1 10*3/uL (ref 0.0–0.1)
Basophil %: 0.7 %
EOS ABS: 0.1 10*3/uL (ref 0.0–0.7)
Eosinophil %: 1.3 %
HCT: 43.2 % (ref 40.0–52.0)
HGB: 15.2 g/dL (ref 13.0–18.0)
LYMPHS ABS: 1.4 10*3/uL (ref 1.0–3.6)
Lymphocyte %: 18.8 %
MCH: 30.7 pg (ref 26.0–34.0)
MCHC: 35.2 g/dL (ref 32.0–36.0)
MCV: 87 fL (ref 80–100)
MONO ABS: 0.7 x10 3/mm (ref 0.2–1.0)
Monocyte %: 9 %
NEUTROS ABS: 5.1 10*3/uL (ref 1.4–6.5)
Neutrophil %: 70.2 %
PLATELETS: 389 10*3/uL (ref 150–440)
RBC: 4.95 10*6/uL (ref 4.40–5.90)
RDW: 12.7 % (ref 11.5–14.5)
WBC: 7.3 10*3/uL (ref 3.8–10.6)

## 2014-04-05 LAB — COMPREHENSIVE METABOLIC PANEL
ALBUMIN: 4.1 g/dL (ref 3.4–5.0)
ANION GAP: 9 (ref 7–16)
AST: 24 U/L (ref 15–37)
Alkaline Phosphatase: 59 U/L
BILIRUBIN TOTAL: 0.4 mg/dL (ref 0.2–1.0)
BUN: 5 mg/dL — ABNORMAL LOW (ref 7–18)
CO2: 28 mmol/L (ref 21–32)
CREATININE: 1.04 mg/dL (ref 0.60–1.30)
Calcium, Total: 8.9 mg/dL (ref 8.5–10.1)
Chloride: 100 mmol/L (ref 98–107)
EGFR (African American): >60
GLUCOSE: 128 mg/dL — AB (ref 65–99)
Osmolality: 273 (ref 275–301)
Potassium: 3.7 mmol/L (ref 3.5–5.1)
SGPT (ALT): 29 U/L
SODIUM: 137 mmol/L (ref 136–145)
Total Protein: 7.6 g/dL (ref 6.4–8.2)

## 2014-04-05 LAB — URINALYSIS, COMPLETE
BILIRUBIN, UR: NEGATIVE
Bacteria: NONE SEEN
GLUCOSE, UR: NEGATIVE mg/dL (ref 0–75)
Ketone: NEGATIVE
LEUKOCYTE ESTERASE: NEGATIVE
Nitrite: NEGATIVE
PROTEIN: NEGATIVE
Ph: 7 (ref 4.5–8.0)
SPECIFIC GRAVITY: 1.003 (ref 1.003–1.030)
Squamous Epithelial: NONE SEEN
WBC UR: NONE SEEN /HPF (ref 0–5)

## 2014-04-05 LAB — LIPASE, BLOOD: Lipase: 238 U/L (ref 73–393)

## 2014-04-05 LAB — TROPONIN I: Troponin-I: <0.02 ng/mL

## 2014-04-22 ENCOUNTER — Emergency Department: Payer: Self-pay | Admitting: Emergency Medicine

## 2014-04-22 LAB — CBC WITH DIFFERENTIAL/PLATELET
BASOS ABS: 0.1 10*3/uL (ref 0.0–0.1)
Basophil %: 1.2 %
EOS ABS: 0.1 10*3/uL (ref 0.0–0.7)
Eosinophil %: 1.5 %
HCT: 44.7 % (ref 40.0–52.0)
HGB: 15.5 g/dL (ref 13.0–18.0)
Lymphocyte #: 2.1 10*3/uL (ref 1.0–3.6)
Lymphocyte %: 26.3 %
MCH: 30.7 pg (ref 26.0–34.0)
MCHC: 34.7 g/dL (ref 32.0–36.0)
MCV: 89 fL (ref 80–100)
MONOS PCT: 8.1 %
Monocyte #: 0.7 x10 3/mm (ref 0.2–1.0)
Neutrophil #: 5.1 10*3/uL (ref 1.4–6.5)
Neutrophil %: 62.9 %
Platelet: 407 10*3/uL (ref 150–440)
RBC: 5.05 10*6/uL (ref 4.40–5.90)
RDW: 12.9 % (ref 11.5–14.5)
WBC: 8.1 10*3/uL (ref 3.8–10.6)

## 2014-04-22 LAB — COMPREHENSIVE METABOLIC PANEL
ALBUMIN: 4.6 g/dL (ref 3.4–5.0)
ALT: 36 U/L
Alkaline Phosphatase: 69 U/L
Anion Gap: 12 (ref 7–16)
BUN: 5 mg/dL — ABNORMAL LOW (ref 7–18)
Bilirubin,Total: 0.4 mg/dL (ref 0.2–1.0)
CHLORIDE: 92 mmol/L — AB (ref 98–107)
CO2: 24 mmol/L (ref 21–32)
Calcium, Total: 9.4 mg/dL (ref 8.5–10.1)
Creatinine: 1.03 mg/dL (ref 0.60–1.30)
GLUCOSE: 127 mg/dL — AB (ref 65–99)
Osmolality: 256 (ref 275–301)
Potassium: 3.3 mmol/L — ABNORMAL LOW (ref 3.5–5.1)
SGOT(AST): 31 U/L (ref 15–37)
Sodium: 128 mmol/L — ABNORMAL LOW (ref 136–145)
Total Protein: 8.3 g/dL — ABNORMAL HIGH (ref 6.4–8.2)

## 2014-04-22 LAB — URINALYSIS, COMPLETE
Bilirubin,UR: NEGATIVE
GLUCOSE, UR: NEGATIVE mg/dL (ref 0–75)
KETONE: NEGATIVE
LEUKOCYTE ESTERASE: NEGATIVE
Nitrite: NEGATIVE
PROTEIN: NEGATIVE
Ph: 7 (ref 4.5–8.0)
Specific Gravity: 1.005 (ref 1.003–1.030)
Squamous Epithelial: NONE SEEN
WBC UR: NONE SEEN /HPF (ref 0–5)

## 2014-04-24 ENCOUNTER — Emergency Department: Payer: Self-pay | Admitting: Student

## 2014-05-10 ENCOUNTER — Emergency Department: Payer: Self-pay | Admitting: Emergency Medicine

## 2014-05-10 LAB — BASIC METABOLIC PANEL
Anion Gap: 9 (ref 7–16)
BUN: 7 mg/dL (ref 7–18)
CHLORIDE: 96 mmol/L — AB (ref 98–107)
CO2: 26 mmol/L (ref 21–32)
Calcium, Total: 8.9 mg/dL (ref 8.5–10.1)
Creatinine: 1.02 mg/dL (ref 0.60–1.30)
EGFR (African American): >60
Glucose: 102 mg/dL — ABNORMAL HIGH (ref 65–99)
Osmolality: 261 (ref 275–301)
POTASSIUM: 3.6 mmol/L (ref 3.5–5.1)
Sodium: 131 mmol/L — ABNORMAL LOW (ref 136–145)

## 2014-05-10 LAB — CBC
HCT: 41.4 % (ref 40.0–52.0)
HGB: 14.2 g/dL (ref 13.0–18.0)
MCH: 30.2 pg (ref 26.0–34.0)
MCHC: 34.4 g/dL (ref 32.0–36.0)
MCV: 88 fL (ref 80–100)
Platelet: 419 10*3/uL (ref 150–440)
RBC: 4.71 10*6/uL (ref 4.40–5.90)
RDW: 12.2 % (ref 11.5–14.5)
WBC: 7.8 10*3/uL (ref 3.8–10.6)

## 2014-05-10 LAB — TROPONIN I

## 2014-05-19 ENCOUNTER — Emergency Department: Payer: Self-pay | Admitting: Emergency Medicine

## 2014-05-19 LAB — CBC WITH DIFFERENTIAL/PLATELET
BASOS ABS: 0.1 10*3/uL (ref 0.0–0.1)
Basophil %: 0.8 %
EOS PCT: 0.4 %
Eosinophil #: 0 10*3/uL (ref 0.0–0.7)
HCT: 42.3 % (ref 40.0–52.0)
HGB: 14.8 g/dL (ref 13.0–18.0)
LYMPHS ABS: 1.5 10*3/uL (ref 1.0–3.6)
Lymphocyte %: 19.9 %
MCH: 30.6 pg (ref 26.0–34.0)
MCHC: 35 g/dL (ref 32.0–36.0)
MCV: 87 fL (ref 80–100)
MONOS PCT: 10 %
Monocyte #: 0.8 x10 3/mm (ref 0.2–1.0)
Neutrophil #: 5.4 10*3/uL (ref 1.4–6.5)
Neutrophil %: 68.9 %
Platelet: 409 10*3/uL (ref 150–440)
RBC: 4.84 10*6/uL (ref 4.40–5.90)
RDW: 12.7 % (ref 11.5–14.5)
WBC: 7.8 10*3/uL (ref 3.8–10.6)

## 2014-05-19 LAB — URINALYSIS, COMPLETE
Bacteria: NONE SEEN
Bilirubin,UR: NEGATIVE
Glucose,UR: NEGATIVE mg/dL (ref 0–75)
Hyaline Cast: 3
Ketone: NEGATIVE
LEUKOCYTE ESTERASE: NEGATIVE
Nitrite: NEGATIVE
Ph: 7 (ref 4.5–8.0)
Protein: NEGATIVE
RBC,UR: 3 /HPF (ref 0–5)
SQUAMOUS EPITHELIAL: NONE SEEN
Specific Gravity: 1.008 (ref 1.003–1.030)
WBC UR: 1 /HPF (ref 0–5)

## 2014-05-19 LAB — COMPREHENSIVE METABOLIC PANEL
ALBUMIN: 4.2 g/dL (ref 3.4–5.0)
Alkaline Phosphatase: 57 U/L
Anion Gap: 11 (ref 7–16)
BILIRUBIN TOTAL: 0.8 mg/dL (ref 0.2–1.0)
BUN: 7 mg/dL (ref 7–18)
CALCIUM: 9 mg/dL (ref 8.5–10.1)
CO2: 24 mmol/L (ref 21–32)
Chloride: 88 mmol/L — ABNORMAL LOW (ref 98–107)
Creatinine: 0.94 mg/dL (ref 0.60–1.30)
EGFR (African American): >60
EGFR (Non-African Amer.): >60
Glucose: 129 mg/dL — ABNORMAL HIGH (ref 65–99)
OSMOLALITY: 247 (ref 275–301)
POTASSIUM: 3.6 mmol/L (ref 3.5–5.1)
SGOT(AST): 15 U/L (ref 15–37)
SGPT (ALT): 30 U/L
Sodium: 123 mmol/L — ABNORMAL LOW (ref 136–145)
Total Protein: 7.7 g/dL (ref 6.4–8.2)

## 2014-05-19 LAB — BASIC METABOLIC PANEL
Anion Gap: 7 (ref 7–16)
BUN: 5 mg/dL — ABNORMAL LOW (ref 7–18)
CHLORIDE: 94 mmol/L — AB (ref 98–107)
Calcium, Total: 9 mg/dL (ref 8.5–10.1)
Co2: 28 mmol/L (ref 21–32)
Creatinine: 0.9 mg/dL (ref 0.60–1.30)
EGFR (African American): >60
EGFR (Non-African Amer.): >60
Glucose: 99 mg/dL (ref 65–99)
OSMOLALITY: 256 (ref 275–301)
Potassium: 4.2 mmol/L (ref 3.5–5.1)
SODIUM: 129 mmol/L — AB (ref 136–145)

## 2014-05-19 LAB — TROPONIN I: Troponin-I: <0.02 ng/mL

## 2014-05-27 ENCOUNTER — Emergency Department: Payer: Self-pay | Admitting: Emergency Medicine

## 2014-05-28 LAB — CBC WITH DIFFERENTIAL/PLATELET
BASOS ABS: 0.1 10*3/uL (ref 0.0–0.1)
BASOS PCT: 1.1 %
EOS ABS: 0.1 10*3/uL (ref 0.0–0.7)
Eosinophil %: 0.7 %
HCT: 43.4 % (ref 40.0–52.0)
HGB: 15.1 g/dL (ref 13.0–18.0)
LYMPHS PCT: 27.4 %
Lymphocyte #: 3 10*3/uL (ref 1.0–3.6)
MCH: 30.6 pg (ref 26.0–34.0)
MCHC: 34.8 g/dL (ref 32.0–36.0)
MCV: 88 fL (ref 80–100)
MONO ABS: 0.9 x10 3/mm (ref 0.2–1.0)
Monocyte %: 8.1 %
NEUTROS ABS: 7 10*3/uL — AB (ref 1.4–6.5)
NEUTROS PCT: 62.7 %
Platelet: 459 10*3/uL — ABNORMAL HIGH (ref 150–440)
RBC: 4.93 10*6/uL (ref 4.40–5.90)
RDW: 12.7 % (ref 11.5–14.5)
WBC: 11.1 10*3/uL — ABNORMAL HIGH (ref 3.8–10.6)

## 2014-05-28 LAB — URINALYSIS, COMPLETE
Bacteria: NONE SEEN
Bilirubin,UR: NEGATIVE
Glucose,UR: NEGATIVE mg/dL
Ketone: NEGATIVE
Leukocyte Esterase: NEGATIVE
Nitrite: NEGATIVE
Ph: 7
Protein: NEGATIVE
RBC,UR: 2 /HPF
Specific Gravity: 1.002
Squamous Epithelial: NONE SEEN
WBC UR: <1 /HPF

## 2014-05-28 LAB — COMPREHENSIVE METABOLIC PANEL
AST: 17 U/L (ref 15–37)
Albumin: 4.3 g/dL (ref 3.4–5.0)
Alkaline Phosphatase: 56 U/L
Anion Gap: 14 (ref 7–16)
BILIRUBIN TOTAL: 0.6 mg/dL (ref 0.2–1.0)
BUN: 5 mg/dL — ABNORMAL LOW (ref 7–18)
CHLORIDE: 91 mmol/L — AB (ref 98–107)
CREATININE: 1.2 mg/dL (ref 0.60–1.30)
Calcium, Total: 9.3 mg/dL (ref 8.5–10.1)
Co2: 24 mmol/L (ref 21–32)
Glucose: 156 mg/dL — ABNORMAL HIGH (ref 65–99)
OSMOLALITY: 259 (ref 275–301)
Potassium: 3.1 mmol/L — ABNORMAL LOW (ref 3.5–5.1)
SGPT (ALT): 31 U/L
Sodium: 129 mmol/L — ABNORMAL LOW (ref 136–145)
TOTAL PROTEIN: 7.7 g/dL (ref 6.4–8.2)

## 2014-05-28 LAB — LIPASE, BLOOD: LIPASE: 221 U/L (ref 73–393)

## 2014-05-28 LAB — TROPONIN I: Troponin-I: <0.02 ng/mL

## 2014-05-29 ENCOUNTER — Ambulatory Visit: Payer: Self-pay

## 2014-05-31 ENCOUNTER — Emergency Department: Payer: Self-pay | Admitting: Emergency Medicine

## 2014-06-01 LAB — URINALYSIS, COMPLETE
BACTERIA: NONE SEEN
Bilirubin,UR: NEGATIVE
Glucose,UR: NEGATIVE mg/dL (ref 0–75)
KETONE: NEGATIVE
Leukocyte Esterase: NEGATIVE
Nitrite: NEGATIVE
PH: 7 (ref 4.5–8.0)
Protein: NEGATIVE
RBC,UR: 2 /HPF (ref 0–5)
SPECIFIC GRAVITY: 1.005 (ref 1.003–1.030)
Squamous Epithelial: <1

## 2014-06-03 ENCOUNTER — Ambulatory Visit: Payer: Self-pay | Admitting: Gastroenterology

## 2014-06-05 ENCOUNTER — Emergency Department: Payer: Self-pay | Admitting: Emergency Medicine

## 2014-06-05 LAB — URINALYSIS, COMPLETE
BACTERIA: NONE SEEN
BILIRUBIN, UR: NEGATIVE
GLUCOSE, UR: NEGATIVE mg/dL (ref 0–75)
Ketone: NEGATIVE
LEUKOCYTE ESTERASE: NEGATIVE
NITRITE: NEGATIVE
Ph: 7 (ref 4.5–8.0)
Protein: NEGATIVE
RBC,UR: 3 /HPF (ref 0–5)
SPECIFIC GRAVITY: 1.006 (ref 1.003–1.030)
SQUAMOUS EPITHELIAL: NONE SEEN
WBC UR: NONE SEEN /HPF (ref 0–5)

## 2014-06-05 LAB — COMPREHENSIVE METABOLIC PANEL
ANION GAP: 12 (ref 7–16)
Albumin: 4.2 g/dL (ref 3.4–5.0)
Alkaline Phosphatase: 56 U/L
BILIRUBIN TOTAL: 0.6 mg/dL (ref 0.2–1.0)
BUN: 6 mg/dL — ABNORMAL LOW (ref 7–18)
CHLORIDE: 90 mmol/L — AB (ref 98–107)
CO2: 25 mmol/L (ref 21–32)
CREATININE: 0.96 mg/dL (ref 0.60–1.30)
Calcium, Total: 8.7 mg/dL (ref 8.5–10.1)
EGFR (Non-African Amer.): >60
Glucose: 126 mg/dL — ABNORMAL HIGH (ref 65–99)
Osmolality: 254 (ref 275–301)
Potassium: 3.5 mmol/L (ref 3.5–5.1)
SGOT(AST): 22 U/L (ref 15–37)
SGPT (ALT): 31 U/L
Sodium: 127 mmol/L — ABNORMAL LOW (ref 136–145)
TOTAL PROTEIN: 7.6 g/dL (ref 6.4–8.2)

## 2014-06-05 LAB — CBC
HCT: 40.7 % (ref 40.0–52.0)
HGB: 14.3 g/dL (ref 13.0–18.0)
MCH: 30.8 pg (ref 26.0–34.0)
MCHC: 35.2 g/dL (ref 32.0–36.0)
MCV: 88 fL (ref 80–100)
Platelet: 398 10*3/uL (ref 150–440)
RBC: 4.64 10*6/uL (ref 4.40–5.90)
RDW: 12.9 % (ref 11.5–14.5)
WBC: 9.2 10*3/uL (ref 3.8–10.6)

## 2014-06-05 LAB — DRUG SCREEN, URINE

## 2014-06-05 LAB — ACETAMINOPHEN LEVEL

## 2014-06-05 LAB — SALICYLATE LEVEL: Salicylates, Serum: 2 mg/dL

## 2014-06-05 LAB — ETHANOL: ETHANOL LVL: 51 mg/dL

## 2014-06-05 LAB — VALPROIC ACID LEVEL

## 2014-06-24 ENCOUNTER — Emergency Department: Payer: Self-pay | Admitting: Emergency Medicine

## 2014-07-01 ENCOUNTER — Emergency Department: Payer: Self-pay | Admitting: Emergency Medicine

## 2014-07-13 ENCOUNTER — Emergency Department: Payer: Self-pay | Admitting: Emergency Medicine

## 2014-07-25 ENCOUNTER — Encounter (HOSPITAL_COMMUNITY): Payer: Self-pay | Admitting: Nurse Practitioner

## 2014-07-25 ENCOUNTER — Emergency Department (HOSPITAL_COMMUNITY)
Admission: EM | Admit: 2014-07-25 | Discharge: 2014-07-25 | Disposition: A | Discharge status: Home / Self Care | Payer: Self-pay | Attending: Emergency Medicine | Admitting: Emergency Medicine

## 2014-07-25 ENCOUNTER — Emergency Department (HOSPITAL_COMMUNITY): Payer: Self-pay

## 2014-07-25 DIAGNOSIS — R531 Weakness: Secondary | ICD-10-CM | POA: Insufficient documentation

## 2014-07-25 DIAGNOSIS — Z8719 Personal history of other diseases of the digestive system: Secondary | ICD-10-CM | POA: Insufficient documentation

## 2014-07-25 DIAGNOSIS — Z720 Tobacco use: Secondary | ICD-10-CM | POA: Insufficient documentation

## 2014-07-25 DIAGNOSIS — I10 Essential (primary) hypertension: Secondary | ICD-10-CM | POA: Insufficient documentation

## 2014-07-25 DIAGNOSIS — Z79899 Other long term (current) drug therapy: Secondary | ICD-10-CM | POA: Insufficient documentation

## 2014-07-25 DIAGNOSIS — B359 Dermatophytosis, unspecified: Secondary | ICD-10-CM | POA: Insufficient documentation

## 2014-07-25 DIAGNOSIS — Z792 Long term (current) use of antibiotics: Secondary | ICD-10-CM | POA: Insufficient documentation

## 2014-07-25 DIAGNOSIS — N410 Acute prostatitis: Secondary | ICD-10-CM | POA: Insufficient documentation

## 2014-07-25 DIAGNOSIS — K6289 Other specified diseases of anus and rectum: Secondary | ICD-10-CM | POA: Insufficient documentation

## 2014-07-25 LAB — CBC
HCT: 42.4 % (ref 39.0–52.0)
Hemoglobin: 15 g/dL (ref 13.0–17.0)
MCH: 30.5 pg (ref 26.0–34.0)
MCHC: 35.4 g/dL (ref 30.0–36.0)
MCV: 86.4 fL (ref 78.0–100.0)
PLATELETS: 376 10*3/uL (ref 150–400)
RBC: 4.91 MIL/uL (ref 4.22–5.81)
RDW: 12.2 % (ref 11.5–15.5)
WBC: 5.9 10*3/uL (ref 4.0–10.5)

## 2014-07-25 LAB — BASIC METABOLIC PANEL
ANION GAP: 9 (ref 5–15)
BUN: 7 mg/dL (ref 6–23)
CALCIUM: 10.1 mg/dL (ref 8.4–10.5)
CHLORIDE: 100 mmol/L (ref 96–112)
CO2: 26 mmol/L (ref 19–32)
Creatinine, Ser: 1 mg/dL (ref 0.50–1.35)
GFR calc Af Amer: >90 mL/min (ref >90)
GFR, EST NON AFRICAN AMERICAN: 88 mL/min — AB (ref >90)
GLUCOSE: 156 mg/dL — AB (ref 70–99)
POTASSIUM: 3.6 mmol/L (ref 3.5–5.1)
Sodium: 135 mmol/L (ref 135–145)

## 2014-07-25 MED ORDER — SODIUM CHLORIDE 0.9 % IV BOLUS (SEPSIS)
1000.0000 mL | Freq: Once | INTRAVENOUS | Status: AC
  Administered 2014-07-25: 1000 mL via INTRAVENOUS

## 2014-07-25 MED ORDER — IOHEXOL 300 MG/ML  SOLN
100.0000 mL | Freq: Once | INTRAMUSCULAR | Status: AC | PRN
  Administered 2014-07-25: 100 mL via INTRAVENOUS

## 2014-07-25 MED ORDER — LORAZEPAM 2 MG/ML IJ SOLN
1.0000 mg | Freq: Once | INTRAMUSCULAR | Status: AC
  Administered 2014-07-25: 1 mg via INTRAVENOUS
  Filled 2014-07-25: qty 1

## 2014-07-25 MED ORDER — IOHEXOL 300 MG/ML  SOLN: 25.0000 mL | Freq: Once | INTRAMUSCULAR | Status: DC | PRN

## 2014-07-25 MED ORDER — FLUCONAZOLE 200 MG PO TABS: 200.0000 mg | ORAL_TABLET | Freq: Every day | ORAL | Status: DC

## 2014-07-25 MED ORDER — HYDROXYZINE HCL 25 MG PO TABS: 25.0000 mg | ORAL_TABLET | Freq: Every evening | ORAL | Status: DC | PRN

## 2014-07-25 NOTE — ED Notes (Signed)
He reports a fishy smelling rash to groin, yellow-white patches on tongue and feeling shaky and weak since he started taking bactrim. He is taking the bactrim for prostatitis

## 2014-07-25 NOTE — Discharge Instructions (Signed)
Prostatitis The prostate gland is about the size and shape of a walnut. It is located just below your bladder. It produces one of the components of semen, which is made up of sperm and the fluids that help nourish and transport it out from the testicles. Prostatitis is inflammation of the prostate gland.  There are four types of prostatitis:  Acute bacterial prostatitis. This is the least common type of prostatitis. It starts quickly and usually is associated with a bladder infection, high fever, and shaking chills. It can occur at any age.  Chronic bacterial prostatitis. This is a persistent bacterial infection in the prostate. It usually develops from repeated acute bacterial prostatitis or acute bacterial prostatitis that was not properly treated. It can occur in men of any age but is most common in middle-aged men whose prostate has begun to enlarge. The symptoms are not as severe as those in acute bacterial prostatitis. Discomfort in the part of your body that is in front of your rectum and below your scrotum (perineum), lower abdomen, or in the head of your penis (glans) may represent your primary discomfort.  Chronic prostatitis (nonbacterial). This is the most common type of prostatitis. It is inflammation of the prostate gland that is not caused by a bacterial infection. The cause is unknown and may be associated with a viral infection or autoimmune disorder.  Prostatodynia (pelvic floor disorder). This is associated with increased muscular tone in the pelvis surrounding the prostate. CAUSES The causes of bacterial prostatitis are bacterial infection. The causes of the other types of prostatitis are unknown.  SYMPTOMS  Symptoms can vary depending upon the type of prostatitis that exists. There can also be overlap in symptoms. Possible symptoms for each type of prostatitis are listed below. Acute Bacterial Prostatitis  Painful urination.  Fever or chills.  Muscle or joint pains.  Low  back pain.  Low abdominal pain.  Inability to empty bladder completely. Chronic Bacterial Prostatitis, Chronic Nonbacterial Prostatitis, and Prostatodynia  Sudden urge to urinate.  Frequent urination.  Difficulty starting urine stream.  Weak urine stream.  Discharge from the urethra.  Dribbling after urination.  Rectal pain.  Pain in the testicles, penis, or tip of the penis.  Pain in the perineum.  Problems with sexual function.  Painful ejaculation.  Bloody semen. DIAGNOSIS  In order to diagnose prostatitis, your health care provider will ask about your symptoms. One or more urine samples will be taken and tested (urinalysis). If the urinalysis result is negative for bacteria, your health care provider may use a finger to feel your prostate (digital rectal exam). This exam helps your health care provider determine if your prostate is swollen and tender. It will also produce a specimen of semen that can be analyzed. TREATMENT  Treatment for prostatitis depends on the cause. If a bacterial infection is the cause, it can be treated with antibiotic medicine. In cases of chronic bacterial prostatitis, the use of antibiotics for up to 1 month or 6 weeks may be necessary. Your health care provider may instruct you to take sitz baths to help relieve pain. A sitz bath is a bath of hot water in which your hips and buttocks are under water. This relaxes the pelvic floor muscles and often helps to relieve the pressure on your prostate. HOME CARE INSTRUCTIONS   Take all medicines as directed by your health care provider.  Take sitz baths as directed by your health care provider. SEEK MEDICAL CARE IF:   Your symptoms  get worse, not better.  You have a fever. SEEK IMMEDIATE MEDICAL CARE IF:   You have chills.  You feel nauseous or vomit.  You feel lightheaded or faint.  You are unable to urinate.  You have blood or blood clots in your urine. MAKE SURE YOU:  Understand  these instructions.  Will watch your condition.  Will get help right away if you are not doing well or get worse. Document Released: 05/05/2000 Document Revised: 05/13/2013 Document Reviewed: 11/25/2012 [facility] Patient Information 2015 [place], [place]. This information is not intended to replace advice given to you by your health care provider. Make sure you discuss any questions you have with your health care provider.  Jock Itch Jock itch is a fungal infection of the skin in the groin area. It is sometimes called "ringworm" even though it is not caused by a worm. A fungus is a type of germ that thrives in dark, damp places.  CAUSES  This infection may spread from:  A fungus infection elsewhere on the body (such as athlete's foot).  Sharing towels or clothing. This infection is more common in:  Hot, humid climates.  People who wear tight-fitting clothing or wet bathing suits for long periods of time.  Athletes.  Overweight people.  People with diabetes. SYMPTOMS  Jock itch causes the following symptoms:  Red, pink or brown rash in the groin. Rash may spread to the thighs, anus, and buttocks.  Itching. DIAGNOSIS  Your caregiver may make the diagnosis by looking at the rash. Sometimes a skin scraping will be sent to test for fungus. Testing can be done either by looking under the microscope or by doing a culture (test to try to grow the fungus). A culture can take up to 2 weeks to come back. TREATMENT  Jock itch may be treated with:  Skin cream or ointment to kill fungus.  Medicine by mouth to kill fungus.  Skin cream or ointment to calm the itching.  Compresses or medicated powders to dry the infected skin. HOME CARE INSTRUCTIONS   Be sure to treat the rash completely. Follow your caregiver's instructions. It can take a couple of weeks to treat. If you do not treat the infection long enough, the rash can come back.  Wear loose-fitting clothing.  Men should wear  cotton boxer shorts.  Women should wear cotton underwear.  Avoid hot baths.  Dry the groin area well after bathing. SEEK MEDICAL CARE IF:   Your rash is worse.  Your rash is spreading.  Your rash returns after treatment is finished.  Your rash is not gone in 4 weeks. Fungal infections are slow to respond to treatment. Some redness may remain for several weeks after the fungus is gone. SEEK IMMEDIATE MEDICAL CARE IF:  The area becomes red, warm, tender, and swollen.  You have a fever. Document Released: 04/28/2002 Document Revised: 07/31/2011 Document Reviewed: 03/27/2008 [facility] Patient Information 2015 [place], [place]. This information is not intended to replace advice given to you by your health care provider. Make sure you discuss any questions you have with your health care provider.

## 2014-07-25 NOTE — ED Provider Notes (Signed)
CSN: [id number]     Arrival date & time 07/25/14  1543 History   First MD Initiated Contact with Jeremiah [name] 07/25/14 1758     Chief Complaint  Jeremiah [name] presents with  . Rash     (Consider location/radiation/quality/duration/timing/severity/associated sxs/prior Treatment) HPI Comments: Jeremiah [name] presents to the ER for evaluation of rash. Jeremiah [name] reports that he was recently diagnosed with recurrent prostatitis, has been on Bactrim. Jeremiah [name] reports that since he started taking the medication he noticed some yellow patches on his tongue and now a rash in his groin. Jeremiah [name] has become weak and he feels very shaky. He reports pain in the rectal area.  Jeremiah [name] is a 49 y.o. male presenting with rash.  Rash   Past Medical History  Diagnosis Date  . Hypertension   . Rectal fistula   . Prostatitis    Past Surgical History  Procedure Laterality Date  . Lumbar spine surgery    . Colonoscopy     History reviewed. No pertinent family history. History  Substance Use Topics  . Smoking status: Current Every Day Smoker  . Smokeless tobacco: Not on file  . Alcohol Use: Yes     Comment: every day or every other day (2-4  12oz cans)    Review of Systems  Gastrointestinal: Positive for rectal pain.  Skin: Positive for rash.  All other systems reviewed and are negative.     Allergies  Milk-related compounds  Home Medications   Prior to Admission medications   Medication Sig Start Date End Date Taking? Authorizing Provider  ALPRAZolam Duanne Moron) 1 MG tablet Take 1 mg by mouth 3 (three) times daily.   Yes Historical Provider, MD  cloNIDine (CATAPRES) 0.1 MG tablet Take 0.2 mg by mouth 2 (two) times daily.    Yes Historical Provider, MD  docusate sodium (COLACE) 100 MG capsule Take 100 mg by mouth daily. Take 2 capsules, then repeat 1 capsule if needed.   Yes Historical Provider, MD  losartan (COZAAR) 100 MG tablet Take 100 mg by mouth daily.   Yes Historical Provider, MD  metoprolol succinate  (TOPROL-XL) 100 MG 24 hr tablet Take 100 mg by mouth daily. Take with or immediately following a meal.   Yes Historical Provider, MD  QUEtiapine (SEROQUEL) 300 MG tablet Take 300 mg by mouth at bedtime.   Yes Historical Provider, MD  ranitidine (ZANTAC) 150 MG tablet Take 150 mg by mouth 2 (two) times daily.   Yes Historical Provider, MD  sertraline (ZOLOFT) 50 MG tablet Take 50 mg by mouth daily.   Yes Historical Provider, MD  simethicone (MYLICON) 80 MG chewable tablet Chew 320 mg by mouth daily.   Yes Historical Provider, MD  sulfamethoxazole-trimethoprim (BACTRIM DS) 800-160 MG per tablet Take 1 tablet by mouth daily.   Yes Historical Provider, MD  tadalafil (CIALIS) 5 MG tablet Take 5-10 mg by mouth daily as needed for erectile dysfunction.   Yes Historical Provider, MD  cephALEXin (KEFLEX) 500 MG capsule Take 1 capsule (500 mg total) by mouth 4 (four) times daily. Jeremiah [name] not taking: Reported on 07/25/2014 10/19/13   Gregor Hams, MD  fluconazole (DIFLUCAN) 200 MG tablet Take 1 tablet (200 mg total) by mouth daily. 07/25/14   Orpah Greek, MD  hydrOXYzine (ATARAX/VISTARIL) 25 MG tablet Take 1 tablet (25 mg total) by mouth at bedtime as needed (insomnia). 07/25/14   Orpah Greek, MD  sulfamethoxazole-trimethoprim (SEPTRA DS) 800-160 MG per tablet Take 2 tablets by mouth 2 (two) times daily. Jeremiah [name]  not taking: Reported on 07/25/2014 10/19/13   Gregor Hams, MD   BP 152/109 mmHg  Pulse 62  Temp(Src) 98.8 F (37.1 C) (Oral)  Resp 16  SpO2 96% Physical Exam  Constitutional: He is oriented to person, place, and time. He appears well-developed and well-nourished. No distress.  HENT:  Head: Normocephalic and atraumatic.  Right Ear: Hearing normal.  Left Ear: Hearing normal.  Nose: Nose normal.  Mouth/Throat: Oropharynx is clear and moist and mucous membranes are normal.  Eyes: Conjunctivae and EOM are normal. Pupils are equal, round, and reactive to light.  Neck: Normal range of  motion. Neck supple.  Cardiovascular: Regular rhythm, S1 normal and S2 normal.  Exam reveals no gallop and no friction rub.   No murmur heard. Pulmonary/Chest: Effort normal and breath sounds normal. No respiratory distress. He exhibits no tenderness.  Abdominal: Soft. Normal appearance and bowel sounds are normal. There is no hepatosplenomegaly. There is no tenderness. There is no rebound, no guarding, no tenderness at McBurney's point and negative Murphy's sign. No hernia.  Musculoskeletal: Normal range of motion.  Neurological: He is alert and oriented to person, place, and time. He has normal strength. No cranial nerve deficit or sensory deficit. Coordination normal. GCS eye subscore is 4. GCS verbal subscore is 5. GCS motor subscore is 6.  Skin: Skin is warm, dry and intact. Rash (erythematous slightly raised rash in groin with satellite lesions present) noted. No cyanosis.  Psychiatric: He has a normal mood and affect. His speech is normal and behavior is normal. Thought content normal.  Nursing note and vitals reviewed.   ED Course  Procedures (including critical care time) Labs Review Labs Reviewed  BASIC METABOLIC PANEL - Abnormal; Notable for the following:    Glucose, Bld 156 (*)    GFR calc non Af Amer 88 (*)    All other components within normal limits  CBC    Imaging Review Ct Abdomen Pelvis W Contrast  07/25/2014   CLINICAL DATA:  49 year old male with increasing pelvic pain. Currently on Bactrim for prostatitis.  EXAM: CT ABDOMEN AND PELVIS WITH CONTRAST  TECHNIQUE: Multidetector CT imaging of the abdomen and pelvis was performed using the standard protocol following bolus administration of intravenous contrast.  CONTRAST:  120mL OMNIPAQUE IOHEXOL 300 MG/ML  SOLN  COMPARISON:  03/07/2014 CT  FINDINGS: Lower chest:  Unremarkable  Hepatobiliary: The liver and gallbladder are unremarkable. There is no evidence of biliary dilatation.  Pancreas: Unremarkable  Spleen: Unremarkable   Adrenals/Urinary Tract: The kidneys, adrenal glands and bladder are unremarkable.  Stomach/Bowel: Unremarkable. There is no evidence of bowel obstruction, pneumoperitoneum or bowel wall thickening. The appendix is normal. The rectum is unremarkable.  Vascular/Lymphatic: No enlarged lymph nodes or abdominal aortic aneurysm identified.  Reproductive: Mild prostate enlargement again noted.  Other: No free fluid or abscess.  Musculoskeletal: No acute or suspicious bony abnormalities identified. Surgical changes in the lumbar spine are again identified.  IMPRESSION: No evidence of acute abnormality.  Unchanged mild prostate enlargement.   Electronically Signed   By: Margarette Canada M.D.   On: 07/25/2014 21:32     EKG Interpretation None      MDM   Final diagnoses:  Rectal pain  Tinea  Acute prostatitis    Jeremiah [name] presents to the ER primarily for rash in the groin. Jeremiah [name] is currently on antibiotics for prostatitis. This was diagnosed at [facility] ER. Jeremiah [name] has had recurrent prostatitis in the past. He does have a rash that  is very consistent with tenia cruris, likely related to the antibiotic use. This will be treated with Diflucan. He reports that he has tried topicals in the past and they have not helped.  Jeremiah [name] does report that he is experiencing some continued rectal pain. CT scan was performed to further evaluate. No evidence of proctitis or diverticulitis, no acute abnormalities are seen. Jeremiah [name] will continue to Bactrim. He reports that he has seen a urologist in [place], has not seen them in a while. Would like a referral. Jeremiah [name] referred to [facility] here in [place].    Orpah Greek, MD 07/25/14 [phone]

## 2014-07-25 NOTE — ED Notes (Signed)
Pt states he does not want CT scan done. States he does not like feeling hot with IV contrast that he had the last time CT was done. Informed to think about risks of refusing CT done. Pt states he will think about then let RN know. Dr. Betsey Holiday notified.

## 2014-07-27 ENCOUNTER — Emergency Department (HOSPITAL_COMMUNITY)
Admission: EM | Admit: 2014-07-27 | Discharge: 2014-07-27 | Disposition: A | Discharge status: Home / Self Care | Payer: Self-pay | Attending: Emergency Medicine | Admitting: Emergency Medicine

## 2014-07-27 DIAGNOSIS — Z87448 Personal history of other diseases of urinary system: Secondary | ICD-10-CM | POA: Insufficient documentation

## 2014-07-27 DIAGNOSIS — R1013 Epigastric pain: Secondary | ICD-10-CM

## 2014-07-27 DIAGNOSIS — I10 Essential (primary) hypertension: Secondary | ICD-10-CM | POA: Insufficient documentation

## 2014-07-27 DIAGNOSIS — Z720 Tobacco use: Secondary | ICD-10-CM | POA: Insufficient documentation

## 2014-07-27 DIAGNOSIS — Z79899 Other long term (current) drug therapy: Secondary | ICD-10-CM | POA: Insufficient documentation

## 2014-07-27 DIAGNOSIS — Z8719 Personal history of other diseases of the digestive system: Secondary | ICD-10-CM | POA: Insufficient documentation

## 2014-07-27 DIAGNOSIS — Z792 Long term (current) use of antibiotics: Secondary | ICD-10-CM | POA: Insufficient documentation

## 2014-07-27 LAB — URINALYSIS, ROUTINE W REFLEX MICROSCOPIC
Bilirubin Urine: NEGATIVE
Glucose, UA: NEGATIVE mg/dL
KETONES UR: NEGATIVE mg/dL
LEUKOCYTES UA: NEGATIVE
NITRITE: NEGATIVE
Protein, ur: NEGATIVE mg/dL
Specific Gravity, Urine: 1.009 (ref 1.005–1.030)
Urobilinogen, UA: 0.2 mg/dL (ref 0.0–1.0)
pH: 7 (ref 5.0–8.0)

## 2014-07-27 LAB — COMPREHENSIVE METABOLIC PANEL
ALT: 21 U/L (ref 0–53)
AST: 26 U/L (ref 0–37)
Albumin: 4.2 g/dL (ref 3.5–5.2)
Alkaline Phosphatase: 47 U/L (ref 39–117)
Anion gap: 10 (ref 5–15)
BILIRUBIN TOTAL: 1 mg/dL (ref 0.3–1.2)
BUN: 6 mg/dL (ref 6–23)
CHLORIDE: 93 mmol/L — AB (ref 96–112)
CO2: 27 mmol/L (ref 19–32)
Calcium: 9.8 mg/dL (ref 8.4–10.5)
Creatinine, Ser: 0.93 mg/dL (ref 0.50–1.35)
GFR calc Af Amer: >90 mL/min (ref >90)
Glucose, Bld: 120 mg/dL — ABNORMAL HIGH (ref 70–99)
Potassium: 3.3 mmol/L — ABNORMAL LOW (ref 3.5–5.1)
SODIUM: 130 mmol/L — AB (ref 135–145)
Total Protein: 6.8 g/dL (ref 6.0–8.3)

## 2014-07-27 LAB — CBC WITH DIFFERENTIAL/PLATELET
BASOS ABS: 0 10*3/uL (ref 0.0–0.1)
Basophils Relative: 1 % (ref 0–1)
Eosinophils Absolute: 0 10*3/uL (ref 0.0–0.7)
Eosinophils Relative: 0 % (ref 0–5)
HCT: 40.7 % (ref 39.0–52.0)
Hemoglobin: 14.8 g/dL (ref 13.0–17.0)
LYMPHS PCT: 24 % (ref 12–46)
Lymphs Abs: 1.6 10*3/uL (ref 0.7–4.0)
MCH: 30.8 pg (ref 26.0–34.0)
MCHC: 36.4 g/dL — ABNORMAL HIGH (ref 30.0–36.0)
MCV: 84.8 fL (ref 78.0–100.0)
MONO ABS: 0.5 10*3/uL (ref 0.1–1.0)
Monocytes Relative: 8 % (ref 3–12)
NEUTROS ABS: 4.3 10*3/uL (ref 1.7–7.7)
Neutrophils Relative %: 67 % (ref 43–77)
PLATELETS: 346 10*3/uL (ref 150–400)
RBC: 4.8 MIL/uL (ref 4.22–5.81)
RDW: 11.6 % (ref 11.5–15.5)
WBC: 6.4 10*3/uL (ref 4.0–10.5)

## 2014-07-27 LAB — LIPASE, BLOOD: Lipase: 38 U/L (ref 11–59)

## 2014-07-27 LAB — URINE MICROSCOPIC-ADD ON

## 2014-07-27 MED ORDER — PANTOPRAZOLE SODIUM 40 MG IV SOLR
40.0000 mg | Freq: Once | INTRAVENOUS | Status: AC
  Administered 2014-07-27: 40 mg via INTRAVENOUS
  Filled 2014-07-27: qty 40

## 2014-07-27 MED ORDER — GI COCKTAIL ~~LOC~~
30.0000 mL | Freq: Once | ORAL | Status: AC
  Administered 2014-07-27: 30 mL via ORAL
  Filled 2014-07-27: qty 30

## 2014-07-27 NOTE — ED Provider Notes (Signed)
CSN: [id number]     Arrival date & time 07/27/14  1731 History   First MD Initiated Contact with Patient 07/27/14 1732     Chief Complaint  Patient presents with  . Abdominal Pain     (Consider location/radiation/quality/duration/timing/severity/associated sxs/prior Treatment) Patient is a 49 y.o. male presenting with abdominal pain. The history is provided by the patient. No language interpreter was used.  Abdominal Pain Pain location:  Epigastric Pain quality: burning   Pain radiates to:  Does not radiate Pain severity:  Moderate Associated symptoms: no chills, no fever, no nausea and no vomiting   Associated symptoms comment:  The patient complains of epigastric, burning type abdominal pain that he has had for some time but made significantly worse today after taking Diflucan for a tinea infection. No nausea or vomiting. He reports a complicated GI history including gastroparesis diagnosed by EGD; diverticulosis (without -itis); chronic intermittent constipation and colon polyps. No history IBD, UC, Crohn's. He has a prescription for Protonix for does not take it on a regular basis and has not taken it for current symptoms. He reports he took Zantac today without relief. No fever. He also reports recent changes to his bowel movements to a mucousy, malodorous, tarry consistency.   Past Medical History  Diagnosis Date  . Hypertension   . Rectal fistula   . Prostatitis    Past Surgical History  Procedure Laterality Date  . Lumbar spine surgery    . Colonoscopy     No family history on file. History  Substance Use Topics  . Smoking status: Current Every Day Smoker  . Smokeless tobacco: Not on file  . Alcohol Use: Yes     Comment: every day or every other day (2-4  12oz cans)    Review of Systems  Constitutional: Negative for fever and chills.  HENT: Negative.   Respiratory: Negative.   Cardiovascular: Negative.   Gastrointestinal: Positive for abdominal pain. Negative for  nausea and vomiting.  Musculoskeletal: Negative.  Negative for myalgias.  Skin: Negative.   Neurological: Negative.       Allergies  Milk-related compounds  Home Medications   Prior to Admission medications   Medication Sig Start Date End Date Taking? Authorizing Provider  ALPRAZolam Duanne Moron) 1 MG tablet Take 1 mg by mouth 3 (three) times daily.    Historical Provider, MD  cephALEXin (KEFLEX) 500 MG capsule Take 1 capsule (500 mg total) by mouth 4 (four) times daily. Patient not taking: Reported on 07/25/2014 10/19/13   Gregor Hams, MD  cloNIDine (CATAPRES) 0.1 MG tablet Take 0.2 mg by mouth 2 (two) times daily.     Historical Provider, MD  docusate sodium (COLACE) 100 MG capsule Take 100 mg by mouth daily. Take 2 capsules, then repeat 1 capsule if needed.    Historical Provider, MD  fluconazole (DIFLUCAN) 200 MG tablet Take 1 tablet (200 mg total) by mouth daily. 07/25/14   Orpah Greek, MD  hydrOXYzine (ATARAX/VISTARIL) 25 MG tablet Take 1 tablet (25 mg total) by mouth at bedtime as needed (insomnia). 07/25/14   Orpah Greek, MD  losartan (COZAAR) 100 MG tablet Take 100 mg by mouth daily.    Historical Provider, MD  metoprolol succinate (TOPROL-XL) 100 MG 24 hr tablet Take 100 mg by mouth daily. Take with or immediately following a meal.    Historical Provider, MD  QUEtiapine (SEROQUEL) 300 MG tablet Take 300 mg by mouth at bedtime.    Historical Provider, MD  ranitidine (  ZANTAC) 150 MG tablet Take 150 mg by mouth 2 (two) times daily.    Historical Provider, MD  sertraline (ZOLOFT) 50 MG tablet Take 50 mg by mouth daily.    Historical Provider, MD  simethicone (MYLICON) 80 MG chewable tablet Chew 320 mg by mouth daily.    Historical Provider, MD  sulfamethoxazole-trimethoprim (BACTRIM DS) 800-160 MG per tablet Take 1 tablet by mouth daily.    Historical Provider, MD  sulfamethoxazole-trimethoprim (SEPTRA DS) 800-160 MG per tablet Take 2 tablets by mouth 2 (two) times  daily. Patient not taking: Reported on 07/25/2014 10/19/13   Gregor Hams, MD  tadalafil (CIALIS) 5 MG tablet Take 5-10 mg by mouth daily as needed for erectile dysfunction.    Historical Provider, MD   BP 143/111 mmHg  Pulse 66  Temp(Src) 98.4 F (36.9 C) (Oral)  Resp 16  Ht 5\' 8"  (1.727 m)  Wt 170 lb (77.111 kg)  BMI 25.85 kg/m2  SpO2 100% Physical Exam  Constitutional: He appears well-developed and well-nourished.  HENT:  Head: Normocephalic.  Neck: Normal range of motion. Neck supple.  Cardiovascular: Normal rate and regular rhythm.   Pulmonary/Chest: Effort normal and breath sounds normal.  Abdominal: Soft. Bowel sounds are normal. There is no rebound and no guarding.  Abdomen is soft, non-distended with mild epigastric tenderness.   Musculoskeletal: Normal range of motion.  Midline lumbar surgical scarring.  Neurological: He is alert. No cranial nerve deficit.  Skin: Skin is warm and dry. No rash noted.  Psychiatric: He has a normal mood and affect.    ED Course  Procedures (including critical care time) Labs Review Labs Reviewed - No data to display  Imaging Review Ct Abdomen Pelvis W Contrast  07/25/2014   CLINICAL DATA:  49 year old male with increasing pelvic pain. Currently on Bactrim for prostatitis.  EXAM: CT ABDOMEN AND PELVIS WITH CONTRAST  TECHNIQUE: Multidetector CT imaging of the abdomen and pelvis was performed using the standard protocol following bolus administration of intravenous contrast.  CONTRAST:  140mL OMNIPAQUE IOHEXOL 300 MG/ML  SOLN  COMPARISON:  03/07/2014 CT  FINDINGS: Lower chest:  Unremarkable  Hepatobiliary: The liver and gallbladder are unremarkable. There is no evidence of biliary dilatation.  Pancreas: Unremarkable  Spleen: Unremarkable  Adrenals/Urinary Tract: The kidneys, adrenal glands and bladder are unremarkable.  Stomach/Bowel: Unremarkable. There is no evidence of bowel obstruction, pneumoperitoneum or bowel wall thickening. The appendix  is normal. The rectum is unremarkable.  Vascular/Lymphatic: No enlarged lymph nodes or abdominal aortic aneurysm identified.  Reproductive: Mild prostate enlargement again noted.  Other: No free fluid or abscess.  Musculoskeletal: No acute or suspicious bony abnormalities identified. Surgical changes in the lumbar spine are again identified.  IMPRESSION: No evidence of acute abnormality.  Unchanged mild prostate enlargement.   Electronically Signed   By: Margarette Canada M.D.   On: 07/25/2014 21:32     EKG Interpretation   Date/Time:  Monday July 27 2014 17:39:52 EST Ventricular Rate:  65 PR Interval:  201 QRS Duration: 96 QT Interval:  412 QTC Calculation: 428 R Axis:   20 Text Interpretation:  Sinus rhythm Abnormal R-wave progression, early  transition Abnormal inferior Q waves No old tracing to compare Confirmed  by [place] ([id number]) on 07/27/2014 5:53:04 PM      MDM   Final diagnoses:  None    1. Abdominal pain  Patient care transferred to [facility], NP.    Charlann Lange, PA-C 07/27/14 1914  Sherwood Gambler,  MD 07/28/14 0040

## 2014-07-27 NOTE — ED Provider Notes (Signed)
Care assumed from [facility] @ 7pm.   Jeremiah [name] is a 49 y.o. male here in the ED with abdominal pain. He has had multiple works up for same and most recent CT scan of his abdomen 2 days ago that showed no acute findings. He has not been taking his medications for his epigastric pain that has been prescribed in the past.   Labs tonight reviewed.   EKG reviewed by Dr. Regenia Skeeter.   BP 130/95 mmHg  Pulse 58  Temp(Src) 98.4 F (36.9 C) (Oral)  Resp 24  Ht 5\' 8"  (1.727 m)  Wt 170 lb (77.111 kg)  BMI 25.85 kg/m2  SpO2 97%  Results for orders placed or performed during the hospital encounter of 07/27/14 (from the past 24 hour(s))  CBC with Differential     Status: Abnormal   Collection Time: 07/27/14  6:24 PM  Result Value Ref Range   WBC 6.4 4.0 - 10.5 K/uL   RBC 4.80 4.22 - 5.81 MIL/uL   Hemoglobin 14.8 13.0 - 17.0 g/dL   HCT 40.7 39.0 - 52.0 %   MCV 84.8 78.0 - 100.0 fL   MCH 30.8 26.0 - 34.0 pg   MCHC 36.4 (H) 30.0 - 36.0 g/dL   RDW 11.6 11.5 - 15.5 %   Platelets 346 150 - 400 K/uL   Neutrophils Relative % 67 43 - 77 %   Neutro Abs 4.3 1.7 - 7.7 K/uL   Lymphocytes Relative 24 12 - 46 %   Lymphs Abs 1.6 0.7 - 4.0 K/uL   Monocytes Relative 8 3 - 12 %   Monocytes Absolute 0.5 0.1 - 1.0 K/uL   Eosinophils Relative 0 0 - 5 %   Eosinophils Absolute 0.0 0.0 - 0.7 K/uL   Basophils Relative 1 0 - 1 %   Basophils Absolute 0.0 0.0 - 0.1 K/uL  Comprehensive metabolic panel     Status: Abnormal   Collection Time: 07/27/14  6:24 PM  Result Value Ref Range   Sodium 130 (L) 135 - 145 mmol/L   Potassium 3.3 (L) 3.5 - 5.1 mmol/L   Chloride 93 (L) 96 - 112 mmol/L   CO2 27 19 - 32 mmol/L   Glucose, Bld 120 (H) 70 - 99 mg/dL   BUN 6 6 - 23 mg/dL   Creatinine, Ser 0.93 0.50 - 1.35 mg/dL   Calcium 9.8 8.4 - 10.5 mg/dL   Total Protein 6.8 6.0 - 8.3 g/dL   Albumin 4.2 3.5 - 5.2 g/dL   AST 26 0 - 37 U/L   ALT 21 0 - 53 U/L   Alkaline Phosphatase 47 39 - 117 U/L   Total Bilirubin 1.0  0.3 - 1.2 mg/dL   GFR calc non Af Amer >90 >90 mL/min   GFR calc Af Amer >90 >90 mL/min   Anion gap 10 5 - 15  Lipase, blood     Status: None   Collection Time: 07/27/14  6:24 PM  Result Value Ref Range   Lipase 38 11 - 59 U/L  Urinalysis, Routine w reflex microscopic     Status: Abnormal   Collection Time: 07/27/14  8:28 PM  Result Value Ref Range   Color, Urine STRAW (A) YELLOW   APPearance CLEAR CLEAR   Specific Gravity, Urine 1.009 1.005 - 1.030   pH 7.0 5.0 - 8.0   Glucose, UA NEGATIVE NEGATIVE mg/dL   Hgb urine dipstick SMALL (A) NEGATIVE   Bilirubin Urine NEGATIVE NEGATIVE   Ketones, ur  NEGATIVE NEGATIVE mg/dL   Protein, ur NEGATIVE NEGATIVE mg/dL   Urobilinogen, UA 0.2 0.0 - 1.0 mg/dL   Nitrite NEGATIVE NEGATIVE   Leukocytes, UA NEGATIVE NEGATIVE  Urine microscopic-add on     Status: None   Collection Time: 07/27/14  8:28 PM  Result Value Ref Range   WBC, UA 0-2 <3 WBC/hpf   RBC / HPF 3-6 <3 RBC/hpf    @ 2130 patient re evaluated. He has multiple question regarding medications for sleep and for pain. I discussed with the patient that he needs to call his PCP in the morning to discuss medication and that he should be taking his medication for his epigastric pain as directed by his PCP.  changes and/or additions. He agrees with plan. Stable for discharge without acute abdomen. Discussed elevated BP and need for follow up.   [address] [place], [place] 07/27/14 2139  Sherwood Gambler, MD 07/28/14 [phone]

## 2014-07-27 NOTE — ED Notes (Signed)
Hope NP at bedside

## 2014-07-27 NOTE — Discharge Instructions (Signed)
Follow up with your primary care doctor or your urologist to discuss continued care for your prostate problems. Take your medications as directed for your epigastric pain. Return as needed for worsening symptoms.

## 2014-07-27 NOTE — ED Notes (Signed)
Pt came in EMS for epigastric pain that began this morning.  Pt has an extensive GI history and reports that the pain has decreased and is now just a burning feeling.  Pt also reports that he has had abnormal bowel movements that "smell more than usual".

## 2014-07-28 ENCOUNTER — Emergency Department (HOSPITAL_COMMUNITY)
Admission: EM | Admit: 2014-07-28 | Discharge: 2014-07-28 | Disposition: A | Discharge status: Home / Self Care | Payer: Self-pay | Attending: Emergency Medicine | Admitting: Emergency Medicine

## 2014-07-28 ENCOUNTER — Encounter (HOSPITAL_COMMUNITY): Payer: Self-pay

## 2014-07-28 DIAGNOSIS — F319 Bipolar disorder, unspecified: Secondary | ICD-10-CM | POA: Insufficient documentation

## 2014-07-28 DIAGNOSIS — Z720 Tobacco use: Secondary | ICD-10-CM | POA: Insufficient documentation

## 2014-07-28 DIAGNOSIS — I10 Essential (primary) hypertension: Secondary | ICD-10-CM | POA: Insufficient documentation

## 2014-07-28 DIAGNOSIS — E871 Hypo-osmolality and hyponatremia: Secondary | ICD-10-CM | POA: Insufficient documentation

## 2014-07-28 DIAGNOSIS — K6289 Other specified diseases of anus and rectum: Secondary | ICD-10-CM | POA: Insufficient documentation

## 2014-07-28 DIAGNOSIS — Z79899 Other long term (current) drug therapy: Secondary | ICD-10-CM | POA: Insufficient documentation

## 2014-07-28 DIAGNOSIS — Z87438 Personal history of other diseases of male genital organs: Secondary | ICD-10-CM | POA: Insufficient documentation

## 2014-07-28 HISTORY — DX: Major depressive disorder, single episode, unspecified: F32.9

## 2014-07-28 HISTORY — DX: Borderline personality disorder: F60.3

## 2014-07-28 HISTORY — DX: Gastroparesis: K31.84

## 2014-07-28 HISTORY — DX: Diverticulosis of intestine, part unspecified, without perforation or abscess without bleeding: K57.90

## 2014-07-28 LAB — CBC WITH DIFFERENTIAL/PLATELET
Basophils Absolute: 0 K/uL (ref 0.0–0.1)
Basophils Relative: 0 % (ref 0–1)
Eosinophils Absolute: 0.1 K/uL (ref 0.0–0.7)
Eosinophils Relative: 1 % (ref 0–5)
HCT: 42.6 % (ref 39.0–52.0)
Hemoglobin: 15.5 g/dL (ref 13.0–17.0)
Lymphocytes Relative: 27 % (ref 12–46)
Lymphs Abs: 2.6 K/uL (ref 0.7–4.0)
MCH: 30.3 pg (ref 26.0–34.0)
MCHC: 36.4 g/dL — ABNORMAL HIGH (ref 30.0–36.0)
MCV: 83.2 fL (ref 78.0–100.0)
Monocytes Absolute: 0.8 K/uL (ref 0.1–1.0)
Monocytes Relative: 8 % (ref 3–12)
Neutro Abs: 6.1 K/uL (ref 1.7–7.7)
Neutrophils Relative %: 64 % (ref 43–77)
Platelets: 405 K/uL — ABNORMAL HIGH (ref 150–400)
RBC: 5.12 MIL/uL (ref 4.22–5.81)
RDW: 11.6 % (ref 11.5–15.5)
WBC: 9.5 K/uL (ref 4.0–10.5)

## 2014-07-28 LAB — URINALYSIS, ROUTINE W REFLEX MICROSCOPIC
BILIRUBIN URINE: NEGATIVE
GLUCOSE, UA: NEGATIVE mg/dL
KETONES UR: NEGATIVE mg/dL
Leukocytes, UA: NEGATIVE
NITRITE: NEGATIVE
PH: 7 (ref 5.0–8.0)
PROTEIN: NEGATIVE mg/dL
Specific Gravity, Urine: 1.004 — ABNORMAL LOW (ref 1.005–1.030)
UROBILINOGEN UA: 0.2 mg/dL (ref 0.0–1.0)

## 2014-07-28 LAB — COMPREHENSIVE METABOLIC PANEL WITH GFR
ALT: 23 U/L (ref 0–53)
AST: 24 U/L (ref 0–37)
Albumin: 5 g/dL (ref 3.5–5.2)
Alkaline Phosphatase: 52 U/L (ref 39–117)
Anion gap: 13 (ref 5–15)
BUN: 7 mg/dL (ref 6–23)
CO2: 24 mmol/L (ref 19–32)
Calcium: 10.2 mg/dL (ref 8.4–10.5)
Chloride: 90 mmol/L — ABNORMAL LOW (ref 96–112)
Creatinine, Ser: 0.71 mg/dL (ref 0.50–1.35)
GFR calc Af Amer: >90 mL/min
GFR calc non Af Amer: >90 mL/min
Glucose, Bld: 109 mg/dL — ABNORMAL HIGH (ref 70–99)
Potassium: 3.1 mmol/L — ABNORMAL LOW (ref 3.5–5.1)
Sodium: 127 mmol/L — ABNORMAL LOW (ref 135–145)
Total Bilirubin: 0.9 mg/dL (ref 0.3–1.2)
Total Protein: 8.1 g/dL (ref 6.0–8.3)

## 2014-07-28 LAB — URINE MICROSCOPIC-ADD ON

## 2014-07-28 LAB — LIPASE, BLOOD: LIPASE: 33 U/L (ref 11–59)

## 2014-07-28 LAB — I-STAT TROPONIN, ED: Troponin i, poc: 0 ng/mL (ref 0.00–0.08)

## 2014-07-28 MED ORDER — CIPROFLOXACIN HCL 500 MG PO TABS: 500.0000 mg | ORAL_TABLET | Freq: Two times a day (BID) | ORAL | Status: DC

## 2014-07-28 NOTE — ED Notes (Signed)
Bed: MMI1 Expected date:  Expected time:  Means of arrival:  Comments: EMS

## 2014-07-28 NOTE — ED Provider Notes (Signed)
CSN: [id number]     Arrival date & time 07/28/14  1818 History   First MD Initiated Contact with Patient 07/28/14 2122     Chief Complaint  Patient presents with  . Anxiety  . Multiple complaints    HPI Patient presents to the emergency room with complaints of persistent pain in his rectal area. Pain radiates towards his penis. Patient has had this pain in the past approximately 6 months to a year ago. The last several days to weeks he has had recurrent episodes of a burning pain in his rectal area. Patient states he is also had malodorous mucoid stools. He's also had large formed stools. Denies any vomiting or diarrhea.  Patient has been evaluated in the emergency department several times recently. He had laboratory tests as well as a CT scan of the abdomen and pelvis that did not show any acute abnormalities Past Medical History  Diagnosis Date  . Hypertension   . Rectal fistula   . Prostatitis   . Diverticulosis   . Gastroparesis   . Bipolar disorder   . Borderline personality disorder   . Major depression    Past Surgical History  Procedure Laterality Date  . Lumbar spine surgery    . Colonoscopy     History reviewed. No pertinent family history. History  Substance Use Topics  . Smoking status: Current Every Day Smoker  . Smokeless tobacco: Not on file  . Alcohol Use: Yes     Comment: every day or every other day (2-4  12oz cans)    Review of Systems  All other systems reviewed and are negative.     Allergies  Milk-related compounds  Home Medications   Prior to Admission medications   Medication Sig Start Date End Date Taking? Authorizing Provider  ALPRAZolam Duanne Moron) 1 MG tablet Take 1 mg by mouth 3 (three) times daily.   Yes Historical Provider, MD  bismuth subsalicylate (PEPTO BISMOL) 262 MG/15ML suspension Take 30 mLs by mouth every 6 (six) hours as needed for indigestion (indigestion).   Yes Historical Provider, MD  calcium carbonate (TUMS EX) 750 MG chewable  tablet Chew 1 tablet by mouth daily as needed for heartburn (heartburn).   Yes Historical Provider, MD  cloNIDine (CATAPRES) 0.2 MG tablet Take 0.2 mg by mouth 2 (two) times daily.   Yes Historical Provider, MD  diphenhydrAMINE (BENADRYL) 25 MG tablet Take 50 mg by mouth every 6 (six) hours as needed for sleep (sleep).   Yes Historical Provider, MD  divalproex (DEPAKOTE) 500 MG DR tablet Take 500 mg by mouth at bedtime.   Yes Historical Provider, MD  docusate sodium (COLACE) 100 MG capsule Take 100 mg by mouth daily as needed for mild constipation (constipation).    Yes Historical Provider, MD  fluconazole (DIFLUCAN) 200 MG tablet Take 1 tablet (200 mg total) by mouth daily. 07/25/14  Yes Orpah Greek, MD  hydrochlorothiazide (HYDRODIURIL) 25 MG tablet Take 25 mg by mouth daily.    Yes Historical Provider, MD  hydrOXYzine (ATARAX/VISTARIL) 25 MG tablet Take 1 tablet (25 mg total) by mouth at bedtime as needed (insomnia). 07/25/14  Yes Orpah Greek, MD  losartan (COZAAR) 100 MG tablet Take 100 mg by mouth daily.   Yes Historical Provider, MD  Melatonin 5 MG TABS Take 1 tablet by mouth at bedtime.   Yes Historical Provider, MD  metoprolol succinate (TOPROL-XL) 100 MG 24 hr tablet Take 100 mg by mouth daily. Take with or immediately following a  meal.   Yes Historical Provider, MD  pantoprazole (PROTONIX) 40 MG tablet Take 40 mg by mouth daily as needed (heartburn).   Yes Historical Provider, MD  phenylephrine-shark liver oil-mineral oil-petrolatum (PREPARATION H) 0.25-3-14-71.9 % rectal ointment Place 1 application rectally 2 (two) times daily as needed for hemorrhoids (rectal pain).   Yes Historical Provider, MD  QUEtiapine (SEROQUEL) 300 MG tablet Take 300 mg by mouth at bedtime.   Yes Historical Provider, MD  ranitidine (ZANTAC) 150 MG tablet Take 150 mg by mouth 2 (two) times daily.   Yes Historical Provider, MD  sertraline (ZOLOFT) 50 MG tablet Take 50 mg by mouth daily.   Yes  Historical Provider, MD  simethicone (MYLICON) 80 MG chewable tablet Chew 80 mg by mouth daily.    Yes Historical Provider, MD  tadalafil (CIALIS) 5 MG tablet Take 5 mg by mouth daily as needed for erectile dysfunction (erectile dysfunction).    Yes Historical Provider, MD  traZODone (DESYREL) 100 MG tablet Take 200 mg by mouth at bedtime.    Yes Historical Provider, MD  ciprofloxacin (CIPRO) 500 MG tablet Take 1 tablet (500 mg total) by mouth every 12 (twelve) hours. 07/28/14   Dorie Rank, MD   BP 156/106 mmHg  Pulse 65  Temp(Src) 98 F (36.7 C) (Oral)  Resp 18  SpO2 97% Physical Exam  Constitutional: He appears well-developed and well-nourished. No distress.  HENT:  Head: Normocephalic and atraumatic.  Right Ear: External ear normal.  Left Ear: External ear normal.  Eyes: Conjunctivae are normal. Right eye exhibits no discharge. Left eye exhibits no discharge. No scleral icterus.  Neck: Neck supple. No tracheal deviation present.  Cardiovascular: Normal rate, regular rhythm and intact distal pulses.   Pulmonary/Chest: Effort normal and breath sounds normal. No stridor. No respiratory distress. He has no wheezes. He has no rales.  Abdominal: Soft. Bowel sounds are normal. He exhibits no distension. There is no tenderness. There is no rebound and no guarding.  Genitourinary:  Tenderness on rectal exam but no gross abnormalities,  Musculoskeletal: He exhibits no edema or tenderness.  Neurological: He is alert. He has normal strength. No cranial nerve deficit (no facial droop, extraocular movements intact, no slurred speech) or sensory deficit. He exhibits normal muscle tone. He displays no seizure activity. Coordination normal.  Skin: Skin is warm and dry. No rash noted.  Psychiatric:  Anxious  Nursing note and vitals reviewed.   ED Course  Procedures (including critical care time) Labs Review Labs Reviewed  CBC WITH DIFFERENTIAL/PLATELET - Abnormal; Notable for the following:     MCHC 36.4 (*)    Platelets 405 (*)    All other components within normal limits  COMPREHENSIVE METABOLIC PANEL - Abnormal; Notable for the following:    Sodium 127 (*)    Potassium 3.1 (*)    Chloride 90 (*)    Glucose, Bld 109 (*)    All other components within normal limits  URINALYSIS, ROUTINE W REFLEX MICROSCOPIC - Abnormal; Notable for the following:    Specific Gravity, Urine 1.004 (*)    Hgb urine dipstick TRACE (*)    All other components within normal limits  LIPASE, BLOOD  URINE MICROSCOPIC-ADD ON  Randolm Idol, ED     MDM   Final diagnoses:  Rectal pain  Hyponatremia    I reviewed the patient's prior laboratory tests.  Sodium level is low at 127. This will require outpatient follow-up but there is no acute indication to have him hospitalized.  Patient's electrolyte panel CBC and urinalysis are otherwise unremarkable. The patient is very anxious about his symptoms. There may be a psychosomatic component. I reassured the patient that does not appear to be any acute emergency process. I do think he would benefit from further outpatient follow-up. Prescription importance of seeing a primary care doctor    Dorie Rank, MD 07/28/14 2207

## 2014-07-28 NOTE — ED Notes (Signed)
Patient states he was getting testosterone injections but has not had any since July 2015. Patient states difficulty sleeping since October 2015. Patient complaining of pain to penis, scrotum, and rectum. Patient also states difficulty having bowel movements

## 2014-07-28 NOTE — ED Notes (Signed)
Patient states "i feel depressed because i cant have sex"

## 2014-07-28 NOTE — ED Notes (Signed)
Patient verbalizes understanding of discharge instructions, prescription medications, home care and follow up care. Patient ambulatory out of department at this time. 

## 2014-07-28 NOTE — ED Notes (Addendum)
Per EMS, Pt, from home, presents w/ multiple complaints.  C/o intermittent rectal, epigastric, and "prostate" pain, anxiety, fatigue, insomnia, and "being scared of everything" since October 2015.  Pt is noncompliant w/ psych medications.  Hx of HTN, manic depression, bipolar disorder and prostatitis.  Pt is concerned, because he "can't stop lying and cannot cry, even though he is sad."  Pt has recently been seen several times at [facility] for various complaints.  Pt asked specifically to come to [facility].  Sts "they will not see me anymore at [facility], because they know me well.  They told me not to come back."  Denies SI/HI/AV.

## 2014-08-07 ENCOUNTER — Emergency Department: Payer: Self-pay | Admitting: Emergency Medicine

## 2014-08-10 ENCOUNTER — Encounter (HOSPITAL_COMMUNITY): Payer: Self-pay | Admitting: Family Medicine

## 2014-08-10 ENCOUNTER — Emergency Department (HOSPITAL_COMMUNITY): Payer: Self-pay

## 2014-08-10 ENCOUNTER — Emergency Department (HOSPITAL_COMMUNITY)
Admission: EM | Admit: 2014-08-10 | Discharge: 2014-08-10 | Disposition: A | Discharge status: Home / Self Care | Payer: Self-pay | Attending: Emergency Medicine | Admitting: Emergency Medicine

## 2014-08-10 DIAGNOSIS — Z720 Tobacco use: Secondary | ICD-10-CM | POA: Insufficient documentation

## 2014-08-10 DIAGNOSIS — Z7952 Long term (current) use of systemic steroids: Secondary | ICD-10-CM | POA: Insufficient documentation

## 2014-08-10 DIAGNOSIS — Z87448 Personal history of other diseases of urinary system: Secondary | ICD-10-CM | POA: Insufficient documentation

## 2014-08-10 DIAGNOSIS — K5900 Constipation, unspecified: Secondary | ICD-10-CM | POA: Insufficient documentation

## 2014-08-10 DIAGNOSIS — F319 Bipolar disorder, unspecified: Secondary | ICD-10-CM | POA: Insufficient documentation

## 2014-08-10 DIAGNOSIS — Z79899 Other long term (current) drug therapy: Secondary | ICD-10-CM | POA: Insufficient documentation

## 2014-08-10 DIAGNOSIS — I10 Essential (primary) hypertension: Secondary | ICD-10-CM | POA: Insufficient documentation

## 2014-08-10 LAB — POC OCCULT BLOOD, ED: Fecal Occult Bld: NEGATIVE

## 2014-08-10 LAB — URINALYSIS, ROUTINE W REFLEX MICROSCOPIC
Bilirubin Urine: NEGATIVE
Glucose, UA: NEGATIVE mg/dL
Hgb urine dipstick: NEGATIVE
KETONES UR: NEGATIVE mg/dL
Leukocytes, UA: NEGATIVE
NITRITE: NEGATIVE
PH: 7 (ref 5.0–8.0)
Protein, ur: NEGATIVE mg/dL
SPECIFIC GRAVITY, URINE: 1.005 (ref 1.005–1.030)
Urobilinogen, UA: 0.2 mg/dL (ref 0.0–1.0)

## 2014-08-10 MED ORDER — GLYCERIN (LAXATIVE) 2 G RE SUPP: 1.0000 | Freq: Two times a day (BID) | RECTAL | Status: DC | PRN

## 2014-08-10 MED ORDER — MAGNESIUM CITRATE PO SOLN
0.5000 | Freq: Once | ORAL | Status: AC
  Administered 2014-08-10: 0.5 via ORAL
  Filled 2014-08-10: qty 296

## 2014-08-10 MED ORDER — POLYETHYLENE GLYCOL 3350 17 GM/SCOOP PO POWD: 17.0000 g | Freq: Every day | ORAL | Status: DC

## 2014-08-10 NOTE — Discharge Instructions (Signed)
Please read and follow all provided instructions.  Your diagnoses today include:  1. Constipation    Tests performed today include:  X-ray of your abdomen that shows a large amount of stool in your abdomen  Vital signs. See below for your results today.   Medications prescribed:   Glycerin suppository - medication to help constipation  Take any medications only as directed on prescription or on packaging.   Home care instructions:  Follow any educational materials contained in this packet.  You can perform bowel prep at home with 255g container of Miralax. Mix the miralax into 128oz of Gatorade. Drink 8oz of Gatorade every 15 minutes until stool runs clear.   Follow-up instructions: Please follow-up with your primary care provider in the next week for further evaluation of your symptoms.   Return instructions:   Please return to the Emergency Department if you experience worsening symptoms.   Please return if you have worsening abdominal pain, swelling of your abdomen, persistent vomiting, blood in your stool or vomit, or fever.   Please return if you have any other emergent concerns.  Additional Information:  Your vital signs today were: BP 119/88 mmHg   Pulse 53   Temp(Src) 97.8 F (36.6 C) (Oral)   Resp 20   Ht 5\' 6"  (1.676 m)   Wt 189 lb 4.8 oz (85.866 kg)   BMI 30.57 kg/m2   SpO2 98% If your blood pressure (BP) was elevated above 135/85 this visit, please have this repeated by your doctor within one month. --------------

## 2014-08-10 NOTE — ED Provider Notes (Signed)
CSN: [id number]     Arrival date & time 08/10/14  1518 History   First MD Initiated Contact with Patient 08/10/14 1734     Chief Complaint  Patient presents with  . Constipation     (Consider location/radiation/quality/duration/timing/severity/associated sxs/prior Treatment) HPI Comments: Patient with history of depression presents with complaint of constipation for the past 14 days. Patient states that he has been using MiraLAX and tried an enema 1 without relief at home over the past 2 weeks. He states that he has some mild epigastric pain. He has nausea but no vomiting. No urinary symptoms. Patient states that he has a burning sensation at times at his rectum. He has a history of prostatitis. Patient was treated for this on 07/28/14 and was prescribed Cipro, however patient states that he has not taken the entire course of this because it upset his stomach. He also claims that he has not been able to sleep for the past several days. No other treatments prior to arrival. Onset of symptoms acute. Course is persistent. Nothing makes symptoms better or worse.  The history is provided by the patient.    Past Medical History  Diagnosis Date  . Hypertension   . Rectal fistula   . Prostatitis   . Diverticulosis   . Gastroparesis   . Bipolar disorder   . Borderline personality disorder   . Major depression    Past Surgical History  Procedure Laterality Date  . Lumbar spine surgery    . Colonoscopy     History reviewed. No pertinent family history. History  Substance Use Topics  . Smoking status: Current Every Day Smoker  . Smokeless tobacco: Not on file  . Alcohol Use: Yes     Comment: every day or every other day (2-4  12oz cans)    Review of Systems  Constitutional: Negative for fever.  HENT: Negative for rhinorrhea and sore throat.   Eyes: Negative for redness.  Respiratory: Negative for cough.   Cardiovascular: Negative for chest pain.  Gastrointestinal: Positive for  abdominal pain, constipation and rectal pain. Negative for nausea, vomiting, diarrhea and blood in stool.  Genitourinary: Negative for dysuria.  Musculoskeletal: Negative for myalgias.  Skin: Negative for rash.  Neurological: Negative for headaches.      Allergies  Milk-related compounds  Home Medications   Prior to Admission medications   Medication Sig Start Date End Date Taking? Authorizing Provider  ALPRAZolam Duanne Moron) 1 MG tablet Take 1 mg by mouth 3 (three) times daily.    Historical Provider, MD  bismuth subsalicylate (PEPTO BISMOL) 262 MG/15ML suspension Take 30 mLs by mouth every 6 (six) hours as needed for indigestion (indigestion).    Historical Provider, MD  calcium carbonate (TUMS EX) 750 MG chewable tablet Chew 1 tablet by mouth daily as needed for heartburn (heartburn).    Historical Provider, MD  ciprofloxacin (CIPRO) 500 MG tablet Take 1 tablet (500 mg total) by mouth every 12 (twelve) hours. 07/28/14   Dorie Rank, MD  cloNIDine (CATAPRES) 0.2 MG tablet Take 0.2 mg by mouth 2 (two) times daily.    Historical Provider, MD  diphenhydrAMINE (BENADRYL) 25 MG tablet Take 50 mg by mouth every 6 (six) hours as needed for sleep (sleep).    Historical Provider, MD  divalproex (DEPAKOTE) 500 MG DR tablet Take 500 mg by mouth at bedtime.    Historical Provider, MD  docusate sodium (COLACE) 100 MG capsule Take 100 mg by mouth daily as needed for mild constipation (constipation).  Historical Provider, MD  fluconazole (DIFLUCAN) 200 MG tablet Take 1 tablet (200 mg total) by mouth daily. 07/25/14   Orpah Greek, MD  hydrochlorothiazide (HYDRODIURIL) 25 MG tablet Take 25 mg by mouth daily.     Historical Provider, MD  hydrOXYzine (ATARAX/VISTARIL) 25 MG tablet Take 1 tablet (25 mg total) by mouth at bedtime as needed (insomnia). 07/25/14   Orpah Greek, MD  losartan (COZAAR) 100 MG tablet Take 100 mg by mouth daily.    Historical Provider, MD  Melatonin 5 MG TABS Take 1  tablet by mouth at bedtime.    Historical Provider, MD  metoprolol succinate (TOPROL-XL) 100 MG 24 hr tablet Take 100 mg by mouth daily. Take with or immediately following a meal.    Historical Provider, MD  pantoprazole (PROTONIX) 40 MG tablet Take 40 mg by mouth daily as needed (heartburn).    Historical Provider, MD  phenylephrine-shark liver oil-mineral oil-petrolatum (PREPARATION H) 0.25-3-14-71.9 % rectal ointment Place 1 application rectally 2 (two) times daily as needed for hemorrhoids (rectal pain).    Historical Provider, MD  QUEtiapine (SEROQUEL) 300 MG tablet Take 300 mg by mouth at bedtime.    Historical Provider, MD  ranitidine (ZANTAC) 150 MG tablet Take 150 mg by mouth 2 (two) times daily.    Historical Provider, MD  sertraline (ZOLOFT) 50 MG tablet Take 50 mg by mouth daily.    Historical Provider, MD  simethicone (MYLICON) 80 MG chewable tablet Chew 80 mg by mouth daily.     Historical Provider, MD  tadalafil (CIALIS) 5 MG tablet Take 5 mg by mouth daily as needed for erectile dysfunction (erectile dysfunction).     Historical Provider, MD  traZODone (DESYREL) 100 MG tablet Take 200 mg by mouth at bedtime.     Historical Provider, MD   BP 126/85 mmHg  Pulse 63  Temp(Src) 97.8 F (36.6 C) (Oral)  Resp 20  Ht 5\' 6"  (1.676 m)  Wt 189 lb 4.8 oz (85.866 kg)  BMI 30.57 kg/m2  SpO2 99%   Physical Exam  Constitutional: He appears well-developed and well-nourished.  HENT:  Head: Normocephalic and atraumatic.  Eyes: Conjunctivae are normal. Right eye exhibits no discharge. Left eye exhibits no discharge.  Neck: Normal range of motion. Neck supple.  Cardiovascular: Normal rate, regular rhythm and normal heart sounds.   Pulmonary/Chest: Effort normal and breath sounds normal.  Abdominal: Soft. There is no tenderness.  Genitourinary: Rectal exam shows tenderness. Rectal exam shows no external hemorrhoid, no internal hemorrhoid, no fissure, no mass and anal tone normal. Guaiac  negative stool. Prostate is tender (mild). Prostate is not enlarged.  Neurological: He is alert.  Skin: Skin is warm and dry.  Psychiatric: He has a normal mood and affect.  Nursing note and vitals reviewed.   ED Course  Procedures (including critical care time) Labs Review Labs Reviewed  URINALYSIS, ROUTINE W REFLEX MICROSCOPIC  POC OCCULT BLOOD, ED    Imaging Review Dg Abd Acute W/chest  08/10/2014   CLINICAL DATA:  Mid lower abdominal pain for 2 weeks with constipation  EXAM: ACUTE ABDOMEN SERIES (ABDOMEN 2 VIEW & CHEST 1 VIEW)  COMPARISON:  None.  FINDINGS: There is no evidence of dilated bowel loops or free intraperitoneal air. No radiopaque calculi or other significant radiographic abnormality is seen. Heart size and mediastinal contours are within normal limits. Both lungs are clear.  IMPRESSION: Negative abdominal radiographs.  No acute cardiopulmonary disease.   Electronically Signed   By: Elbert Ewings  Patel   On: 08/10/2014 19:18     EKG Interpretation None       5:50 PM Patient seen and examined. Work-up initiated.    Vital signs reviewed and are as follows: BP 126/85 mmHg  Pulse 63  Temp(Src) 97.8 F (36.6 C) (Oral)  Resp 20  Ht 5\' 6"  (1.676 m)  Wt 189 lb 4.8 oz (85.866 kg)  BMI 30.57 kg/m2  SpO2 99%  Rectal exam performed with nurse chaperone.   8:04 PM patient informed of results. He has had a bottle of magnesium citrate. Exam is unchanged.  Was discharged home with instructions to use glycerin suppositories and perform a bowel prep with Gatorade and Miralax. Patient provided with instructions on this.  The patient was urged to return to the Emergency Department immediately with worsening of current symptoms, worsening abdominal pain, persistent vomiting, blood noted in stools, fever, or any other concerns. The patient verbalized understanding.   MDM   Final diagnoses:  Constipation   Patient with constipation, no peritoneal signs on exam. Acute abdomen  series demonstrates stool in the colon but no obstruction. Abdomen is soft and nontender. Do not suspect prostatitis. Patient was previously treated for this. Patient given instructions to perform bowel regimen at home. Appropriate return instructions discussed with patient.    Carlisle Cater, PA-C 08/10/14 2006  Davonna Belling, MD 08/11/14 [phone]

## 2014-08-14 ENCOUNTER — Emergency Department: Payer: Self-pay | Admitting: Student

## 2014-08-15 LAB — URINALYSIS, COMPLETE
Bacteria: NONE SEEN
Bilirubin,UR: NEGATIVE
GLUCOSE, UR: NEGATIVE mg/dL (ref 0–75)
Ketone: NEGATIVE
LEUKOCYTE ESTERASE: NEGATIVE
NITRITE: NEGATIVE
PH: 7 (ref 4.5–8.0)
PROTEIN: NEGATIVE
RBC,UR: 2 /HPF (ref 0–5)
SPECIFIC GRAVITY: 1.003 (ref 1.003–1.030)
Squamous Epithelial: <1

## 2014-08-15 LAB — CBC WITH DIFFERENTIAL/PLATELET
Basophil #: 0.3 10*3/uL — ABNORMAL HIGH (ref 0.0–0.1)
Basophil %: 3.3 %
EOS ABS: 0 10*3/uL (ref 0.0–0.7)
Eosinophil %: 0.5 %
HCT: 43.1 % (ref 40.0–52.0)
HGB: 15.2 g/dL (ref 13.0–18.0)
Lymphocyte #: 1.3 10*3/uL (ref 1.0–3.6)
Lymphocyte %: 13.1 %
MCH: 30.2 pg (ref 26.0–34.0)
MCHC: 35.4 g/dL (ref 32.0–36.0)
MCV: 85 fL (ref 80–100)
Monocyte #: 1.2 x10 3/mm — ABNORMAL HIGH (ref 0.2–1.0)
Monocyte %: 12.4 %
NEUTROS PCT: 70.7 %
Neutrophil #: 7 10*3/uL — ABNORMAL HIGH (ref 1.4–6.5)
PLATELETS: 360 10*3/uL (ref 150–440)
RBC: 5.05 10*6/uL (ref 4.40–5.90)
RDW: 12.4 % (ref 11.5–14.5)
WBC: 9.9 10*3/uL (ref 3.8–10.6)

## 2014-08-15 LAB — COMPREHENSIVE METABOLIC PANEL
ALBUMIN: 4.5 g/dL
ALK PHOS: 49 U/L
ANION GAP: 12 (ref 7–16)
BILIRUBIN TOTAL: 0.7 mg/dL
BUN: <5 mg/dL
CREATININE: 0.79 mg/dL
Calcium, Total: 9.7 mg/dL
Chloride: 88 mmol/L — ABNORMAL LOW
Co2: 29 mmol/L
EGFR (African American): >60
EGFR (Non-African Amer.): >60
Glucose: 112 mg/dL — ABNORMAL HIGH
POTASSIUM: 3.3 mmol/L — AB
SGOT(AST): 32 U/L
SGPT (ALT): 22 U/L
Sodium: 129 mmol/L — ABNORMAL LOW
Total Protein: 7.4 g/dL

## 2014-08-15 LAB — TROPONIN I: Troponin-I: <0.03 ng/mL

## 2014-08-15 LAB — LIPASE, BLOOD: Lipase: 33 U/L

## 2014-08-20 LAB — URINALYSIS, COMPLETE
BACTERIA: NONE SEEN
BILIRUBIN, UR: NEGATIVE
Glucose,UR: NEGATIVE mg/dL (ref 0–75)
KETONE: NEGATIVE
Leukocyte Esterase: NEGATIVE
Nitrite: NEGATIVE
PH: 7 (ref 4.5–8.0)
Protein: NEGATIVE
Specific Gravity: 1.002 (ref 1.003–1.030)
Squamous Epithelial: NONE SEEN
WBC UR: NONE SEEN /HPF (ref 0–5)

## 2014-08-20 LAB — COMPREHENSIVE METABOLIC PANEL
ALT: 30 U/L
Albumin: 4.4 g/dL
Alkaline Phosphatase: 44 U/L
Anion Gap: 11 (ref 7–16)
BILIRUBIN TOTAL: 0.8 mg/dL
BUN: <5 mg/dL
CO2: 27 mmol/L
Calcium, Total: 9.2 mg/dL
Chloride: 86 mmol/L — ABNORMAL LOW
Creatinine: 0.7 mg/dL
Glucose: 108 mg/dL — ABNORMAL HIGH
Potassium: 3 mmol/L — ABNORMAL LOW
SGOT(AST): 29 U/L
Sodium: 124 mmol/L — ABNORMAL LOW
Total Protein: 7.1 g/dL

## 2014-08-20 LAB — TROPONIN I: Troponin-I: <0.03 ng/mL

## 2014-08-20 LAB — CK TOTAL AND CKMB (NOT AT ARMC)
CK, TOTAL: 194 U/L
CK-MB: 2 ng/mL

## 2014-08-20 LAB — CBC
HCT: 41.5 % (ref 40.0–52.0)
HGB: 14.2 g/dL (ref 13.0–18.0)
MCH: 29.5 pg (ref 26.0–34.0)
MCHC: 34.3 g/dL (ref 32.0–36.0)
MCV: 86 fL (ref 80–100)
PLATELETS: 320 10*3/uL (ref 150–440)
RBC: 4.82 10*6/uL (ref 4.40–5.90)
RDW: 12 % (ref 11.5–14.5)
WBC: 8.2 10*3/uL (ref 3.8–10.6)

## 2014-08-21 ENCOUNTER — Inpatient Hospital Stay
Admit: 2014-08-21 | Disposition: A | Discharge status: Home / Self Care | Payer: Self-pay | Attending: Internal Medicine | Admitting: Internal Medicine

## 2014-08-21 LAB — BASIC METABOLIC PANEL
Anion Gap: 10 (ref 7–16)
BUN: <5 mg/dL
CHLORIDE: 94 mmol/L — AB
Calcium, Total: 9.7 mg/dL
Co2: 31 mmol/L
Creatinine: 0.83 mg/dL
EGFR (African American): >60
EGFR (Non-African Amer.): >60
GLUCOSE: 123 mg/dL — AB
Potassium: 3.5 mmol/L
Sodium: 135 mmol/L

## 2014-08-21 LAB — DRUG SCREEN, URINE
AMPHETAMINES, UR SCREEN: NEGATIVE
Barbiturates, Ur Screen: NEGATIVE
Benzodiazepine, Ur Scrn: POSITIVE
CANNABINOID 50 NG, UR ~~LOC~~: NEGATIVE
COCAINE METABOLITE, UR ~~LOC~~: NEGATIVE
MDMA (Ecstasy)Ur Screen: NEGATIVE
Methadone, Ur Screen: NEGATIVE
OPIATE, UR SCREEN: NEGATIVE
Phencyclidine (PCP) Ur S: NEGATIVE
Tricyclic, Ur Screen: NEGATIVE

## 2014-08-21 LAB — ETHANOL

## 2014-08-21 LAB — SODIUM: SODIUM, URINE RANDOM: 136 mmol/L

## 2014-08-21 LAB — OSMOLALITY: OSMOLALITY: 277 mosm/kg (ref 275–295)

## 2014-08-21 LAB — OSMOLALITY, URINE: Osmolality: 116 mOsm/kg

## 2014-08-22 LAB — CBC WITH DIFFERENTIAL/PLATELET
BASOS ABS: 0.1 10*3/uL (ref 0.0–0.1)
Basophil %: 0.8 %
Eosinophil #: 0.1 10*3/uL (ref 0.0–0.7)
Eosinophil %: 1.7 %
HCT: 39.6 % — AB (ref 40.0–52.0)
HGB: 13.9 g/dL (ref 13.0–18.0)
Lymphocyte #: 2.6 10*3/uL (ref 1.0–3.6)
Lymphocyte %: 34.4 %
MCH: 30.2 pg (ref 26.0–34.0)
MCHC: 35.1 g/dL (ref 32.0–36.0)
MCV: 86 fL (ref 80–100)
Monocyte #: 0.8 x10 3/mm (ref 0.2–1.0)
Monocyte %: 9.9 %
NEUTROS ABS: 4.1 10*3/uL (ref 1.4–6.5)
Neutrophil %: 53.2 %
Platelet: 306 10*3/uL (ref 150–440)
RBC: 4.61 10*6/uL (ref 4.40–5.90)
RDW: 12.3 % (ref 11.5–14.5)
WBC: 7.7 10*3/uL (ref 3.8–10.6)

## 2014-08-22 LAB — BASIC METABOLIC PANEL
Anion Gap: 7 (ref 7–16)
BUN: 6 mg/dL
CREATININE: 0.8 mg/dL
Calcium, Total: 9 mg/dL
Chloride: 101 mmol/L
Co2: 28 mmol/L
EGFR (African American): >60
EGFR (Non-African Amer.): >60
Glucose: 97 mg/dL
Potassium: 3.2 mmol/L — ABNORMAL LOW
SODIUM: 136 mmol/L

## 2014-08-22 LAB — OSMOLALITY: Osmolality: 282 mOsm/kg (ref 275–295)

## 2014-08-22 LAB — TSH: THYROID STIMULATING HORM: 2.388 u[IU]/mL

## 2014-08-22 LAB — URIC ACID: URIC ACID: 6.3 mg/dL

## 2014-08-26 ENCOUNTER — Emergency Department
Admit: 2014-08-26 | Disposition: A | Discharge status: Home / Self Care | Payer: Self-pay | Admitting: Emergency Medicine

## 2014-08-26 LAB — BASIC METABOLIC PANEL
Anion Gap: 7 (ref 7–16)
BUN: 7 mg/dL
CHLORIDE: 102 mmol/L
CREATININE: 0.89 mg/dL
Calcium, Total: 9.6 mg/dL
Co2: 28 mmol/L
EGFR (African American): >60
EGFR (Non-African Amer.): >60
Glucose: 126 mg/dL — ABNORMAL HIGH
POTASSIUM: 3.5 mmol/L
Sodium: 137 mmol/L

## 2014-08-26 LAB — CBC
HCT: 42.6 % (ref 40.0–52.0)
HGB: 14.7 g/dL (ref 13.0–18.0)
MCH: 29.8 pg (ref 26.0–34.0)
MCHC: 34.5 g/dL (ref 32.0–36.0)
MCV: 86 fL (ref 80–100)
Platelet: 375 10*3/uL (ref 150–440)
RBC: 4.93 10*6/uL (ref 4.40–5.90)
RDW: 12.4 % (ref 11.5–14.5)
WBC: 8.2 10*3/uL (ref 3.8–10.6)

## 2014-08-26 LAB — TROPONIN I

## 2014-08-27 ENCOUNTER — Emergency Department
Admit: 2014-08-27 | Disposition: A | Discharge status: Home / Self Care | Payer: Self-pay | Admitting: Emergency Medicine

## 2014-08-27 DIAGNOSIS — I10 Essential (primary) hypertension: Secondary | ICD-10-CM | POA: Insufficient documentation

## 2014-08-27 HISTORY — DX: Essential (primary) hypertension: I10

## 2014-09-01 ENCOUNTER — Emergency Department
Admit: 2014-09-01 | Disposition: A | Discharge status: Home / Self Care | Payer: Self-pay | Admitting: Emergency Medicine

## 2014-09-03 ENCOUNTER — Emergency Department (HOSPITAL_COMMUNITY)
Admission: EM | Admit: 2014-09-03 | Discharge: 2014-09-03 | Disposition: A | Discharge status: Home / Self Care | Payer: Self-pay | Attending: Emergency Medicine | Admitting: Emergency Medicine

## 2014-09-03 ENCOUNTER — Encounter (HOSPITAL_COMMUNITY): Payer: Self-pay | Admitting: Emergency Medicine

## 2014-09-03 DIAGNOSIS — F419 Anxiety disorder, unspecified: Secondary | ICD-10-CM | POA: Insufficient documentation

## 2014-09-03 DIAGNOSIS — I10 Essential (primary) hypertension: Secondary | ICD-10-CM | POA: Insufficient documentation

## 2014-09-03 DIAGNOSIS — Z8719 Personal history of other diseases of the digestive system: Secondary | ICD-10-CM | POA: Insufficient documentation

## 2014-09-03 DIAGNOSIS — R5383 Other fatigue: Secondary | ICD-10-CM | POA: Insufficient documentation

## 2014-09-03 DIAGNOSIS — R5381 Other malaise: Secondary | ICD-10-CM

## 2014-09-03 DIAGNOSIS — Z720 Tobacco use: Secondary | ICD-10-CM | POA: Insufficient documentation

## 2014-09-03 DIAGNOSIS — E876 Hypokalemia: Secondary | ICD-10-CM | POA: Insufficient documentation

## 2014-09-03 DIAGNOSIS — Z79899 Other long term (current) drug therapy: Secondary | ICD-10-CM | POA: Insufficient documentation

## 2014-09-03 LAB — CBC WITH DIFFERENTIAL/PLATELET
Basophils Absolute: 0 10*3/uL (ref 0.0–0.1)
Basophils Relative: 1 % (ref 0–1)
EOS PCT: 1 % (ref 0–5)
Eosinophils Absolute: 0.1 10*3/uL (ref 0.0–0.7)
HCT: 40 % (ref 39.0–52.0)
Hemoglobin: 14 g/dL (ref 13.0–17.0)
Lymphocytes Relative: 28 % (ref 12–46)
Lymphs Abs: 2.1 10*3/uL (ref 0.7–4.0)
MCH: 29.9 pg (ref 26.0–34.0)
MCHC: 35 g/dL (ref 30.0–36.0)
MCV: 85.5 fL (ref 78.0–100.0)
MONOS PCT: 6 % (ref 3–12)
Monocytes Absolute: 0.5 10*3/uL (ref 0.1–1.0)
NEUTROS PCT: 64 % (ref 43–77)
Neutro Abs: 4.8 10*3/uL (ref 1.7–7.7)
Platelets: 406 10*3/uL — ABNORMAL HIGH (ref 150–400)
RBC: 4.68 MIL/uL (ref 4.22–5.81)
RDW: 12 % (ref 11.5–15.5)
WBC: 7.5 10*3/uL (ref 4.0–10.5)

## 2014-09-03 LAB — COMPREHENSIVE METABOLIC PANEL
ALBUMIN: 4 g/dL (ref 3.5–5.2)
ALK PHOS: 46 U/L (ref 39–117)
ALT: 19 U/L (ref 0–53)
AST: 21 U/L (ref 0–37)
Anion gap: 10 (ref 5–15)
BILIRUBIN TOTAL: 0.5 mg/dL (ref 0.3–1.2)
BUN: 9 mg/dL (ref 6–23)
CHLORIDE: 99 mmol/L (ref 96–112)
CO2: 27 mmol/L (ref 19–32)
CREATININE: 1.03 mg/dL (ref 0.50–1.35)
Calcium: 9.4 mg/dL (ref 8.4–10.5)
GFR calc Af Amer: >90 mL/min (ref >90)
GFR, EST NON AFRICAN AMERICAN: 84 mL/min — AB (ref >90)
Glucose, Bld: 111 mg/dL — ABNORMAL HIGH (ref 70–99)
POTASSIUM: 3.9 mmol/L (ref 3.5–5.1)
Sodium: 136 mmol/L (ref 135–145)
Total Protein: 6.8 g/dL (ref 6.0–8.3)

## 2014-09-03 NOTE — ED Provider Notes (Signed)
CSN: [id number]     Arrival date & time 09/03/14  1412 History   First MD Initiated Contact with Patient 09/03/14 1557     Chief Complaint  Patient presents with  . Fatigue     (Consider location/radiation/quality/duration/timing/severity/associated sxs/prior Treatment) HPI   Jeremiah [name] is a 49 y.o. 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 He is also concerned that his electrolytes are out of whack. He states he was hospitalized at [facility] recently and treated for hyponatremia and hypokalemia. Since that time, he was taken off of his hydrochlorothiazide, and started on Norvasc. He has trouble eating and this revealed the constipation. He is in the ED a couple weeks ago and recommended to use MiraLAX for constipation. He denies fever, chills, nausea, vomiting, paresthesias or localized weakness. He reports being under stress secondary to "finances". There are no other known modifying factors.   Past Medical History  Diagnosis Date  . Hypertension   . Rectal fistula   . Prostatitis   . Diverticulosis   . Gastroparesis   . Bipolar disorder   . Borderline personality disorder   . Major depression    Past Surgical History  Procedure Laterality Date  . Lumbar spine surgery    . Colonoscopy     History reviewed. No pertinent family history. History  Substance Use Topics  . Smoking status: Current Every Day Smoker  . Smokeless tobacco: Not on file  . Alcohol Use: Yes     Comment: every day or every other day (2-4  12oz cans)    Review of Systems  All other systems reviewed and are negative.     Allergies  Milk-related compounds  Home Medications   Prior to Admission medications   Medication Sig Start Date End Date Taking? Authorizing Provider  ALPRAZolam Duanne Moron) 1 MG tablet Take 1 mg by mouth 3 (three) times daily.    Historical Provider, MD  bismuth subsalicylate (PEPTO BISMOL)  262 MG/15ML suspension Take 30 mLs by mouth every 6 (six) hours as needed for indigestion (indigestion).    Historical Provider, MD  calcium carbonate (TUMS EX) 750 MG chewable tablet Chew 1 tablet by mouth daily as needed for heartburn (heartburn).    Historical Provider, MD  ciprofloxacin (CIPRO) 500 MG tablet Take 1 tablet (500 mg total) by mouth every 12 (twelve) hours. Patient not taking: Reported on 08/10/2014 07/28/14   Dorie Rank, MD  cloNIDine (CATAPRES) 0.2 MG tablet Take 0.2 mg by mouth 2 (two) times daily.    Historical Provider, MD  diphenhydrAMINE (BENADRYL) 25 MG tablet Take 50 mg by mouth every 6 (six) hours as needed for sleep (sleep).    Historical Provider, MD  divalproex (DEPAKOTE) 500 MG DR tablet Take 500 mg by mouth at bedtime.    Historical Provider, MD  docusate sodium (COLACE) 100 MG capsule Take 100 mg by mouth daily as needed for mild constipation (constipation).     Historical Provider, MD  fluconazole (DIFLUCAN) 200 MG tablet Take 1 tablet (200 mg total) by mouth daily. Patient not taking: Reported on 08/10/2014 07/25/14   Orpah Greek, MD  glycerin adult (GLYCERIN ADULT) 2 G SUPP Place 1 suppository rectally 2 (two) times daily as needed for moderate constipation or severe constipation. 08/10/14   Carlisle Cater, PA-C  hydrochlorothiazide (HYDRODIURIL) 25 MG tablet Take 25 mg by mouth daily.     Historical Provider, MD  hydrOXYzine (  ATARAX/VISTARIL) 25 MG tablet Take 1 tablet (25 mg total) by mouth at bedtime as needed (insomnia). 07/25/14   Orpah Greek, MD  losartan (COZAAR) 100 MG tablet Take 100 mg by mouth daily.    Historical Provider, MD  Melatonin 5 MG TABS Take 1 tablet by mouth at bedtime.    Historical Provider, MD  metoprolol succinate (TOPROL-XL) 100 MG 24 hr tablet Take 100 mg by mouth daily. Take with or immediately following a meal.    Historical Provider, MD  pantoprazole (PROTONIX) 40 MG tablet Take 40 mg by mouth daily as needed (heartburn).     Historical Provider, MD  phenylephrine-shark liver oil-mineral oil-petrolatum (PREPARATION H) 0.25-3-14-71.9 % rectal ointment Place 1 application rectally 2 (two) times daily as needed for hemorrhoids (rectal pain).    Historical Provider, MD  polyethylene glycol powder (GLYCOLAX/MIRALAX) powder Take 17 g by mouth daily. 08/10/14   Carlisle Cater, PA-C  QUEtiapine (SEROQUEL) 300 MG tablet Take 300 mg by mouth at bedtime.    Historical Provider, MD  ranitidine (ZANTAC) 150 MG tablet Take 150 mg by mouth 2 (two) times daily.    Historical Provider, MD  sertraline (ZOLOFT) 50 MG tablet Take 50 mg by mouth daily.    Historical Provider, MD  simethicone (MYLICON) 80 MG chewable tablet Chew 80 mg by mouth daily.     Historical Provider, MD  tadalafil (CIALIS) 5 MG tablet Take 5 mg by mouth daily as needed for erectile dysfunction (erectile dysfunction).     Historical Provider, MD  traZODone (DESYREL) 100 MG tablet Take 200 mg by mouth at bedtime.     Historical Provider, MD   BP 123/84 mmHg  Pulse 67  Temp(Src) 98.3 F (36.8 C) (Oral)  Resp 18  SpO2 97% Physical Exam  Constitutional: He is oriented to person, place, and time. He appears well-developed and well-nourished. No distress (Nontoxic in appearance.).  HENT:  Head: Normocephalic and atraumatic.  Right Ear: External ear normal.  Left Ear: External ear normal.  Eyes: Conjunctivae and EOM are normal. Pupils are equal, round, and reactive to light.  Neck: Normal range of motion and phonation normal. Neck supple.  Cardiovascular: Normal rate, regular rhythm and normal heart sounds.   Pulmonary/Chest: Effort normal and breath sounds normal. He exhibits no bony tenderness.  Abdominal: Soft. There is no tenderness.  Musculoskeletal: Normal range of motion.  Small subcutaneous nodules lumbar area, bilaterally, near site of previous lumbar surgery. No swelling or deformity in this area.  Neurological: He is alert and oriented to person, place,  and time. No cranial nerve deficit or sensory deficit. He exhibits normal muscle tone. Coordination normal.  Skin: Skin is warm, dry and intact.  Psychiatric: His behavior is normal. Judgment and thought content normal.  He is anxious  Nursing note and vitals reviewed.   ED Course  Procedures (including critical care time)  Findings discussed with patient. Discussed result. There is no indication that he needs supplementation with potassium, this time.   Labs Review Labs Reviewed  COMPREHENSIVE METABOLIC PANEL - Abnormal; Notable for the following:    Glucose, Bld 111 (*)    GFR calc non Af Amer 84 (*)    All other components within normal limits  CBC WITH DIFFERENTIAL/PLATELET - Abnormal; Notable for the following:    Platelets 406 (*)    All other components within normal limits    Imaging Review No results found.   EKG Interpretation   Date/Time:  Thursday September 03 2014  14:24:51 EDT Ventricular Rate:  67 PR Interval:  196 QRS Duration: 84 QT Interval:  390 QTC Calculation: 412 R Axis:   65 Text Interpretation:  Normal sinus rhythm Normal ECG since last tracing no  significant change Confirmed by Eulis Foster  MD, Vira Agar ([id number]) on 09/03/2014  4:11:22 PM      MDM   Final diagnoses:  Malaise  Hypokalemia    Fatigue and malaise without clear abnormality. Incidental hypokalemia that can be corrected nutritionally. There is no indication for hospitalization, further evaluation in ED setting or other interventions in his chronic care plan.   Nursing Notes Reviewed/ Care Coordinated Applicable Imaging Reviewed Interpretation of Laboratory Data incorporated into ED treatment  The patient appears reasonably screened and/or stabilized for discharge and I doubt any other medical condition or other [facility] requiring further screening, evaluation, or treatment in the ED at this time prior to discharge.  Plan: Home Medications- usual; Home Treatments- increase diet with potassium  containing foods; return here if the recommended treatment, does not improve the symptoms; Recommended follow up- PCP 1 week     Daleen Bo, MD 09/03/14 1614

## 2014-09-03 NOTE — Discharge Instructions (Signed)
Get plenty of rest and eat 3 meals each day. Eat foods that are high in potassium as noted below. Use Miralax 1-2 times a day if needed for constipation.    Fatigue Fatigue is a feeling of tiredness, lack of energy, lack of motivation, or feeling tired all the time. Having enough rest, good nutrition, and reducing stress will normally reduce fatigue. Consult your caregiver if it persists. The nature of your fatigue will help your caregiver to find out its cause. The treatment is based on the cause.  CAUSES  There are many causes for fatigue. Most of the time, fatigue can be traced to one or more of your habits or routines. Most causes fit into one or more of three general areas. They are: Lifestyle problems  Sleep disturbances.  Overwork.  Physical exertion.  Unhealthy habits.  Poor eating habits or eating disorders.  Alcohol and/or drug use .  Lack of proper nutrition (malnutrition). Psychological problems  Stress and/or anxiety problems.  Depression.  Grief.  Boredom. Medical Problems or Conditions  Anemia.  Pregnancy.  Thyroid gland problems.  Recovery from major surgery.  Continuous pain.  Emphysema or asthma that is not well controlled  Allergic conditions.  Diabetes.  Infections (such as mononucleosis).  Obesity.  Sleep disorders, such as sleep apnea.  Heart failure or other heart-related problems.  Cancer.  Kidney disease.  Liver disease.  Effects of certain medicines such as antihistamines, cough and cold remedies, prescription pain medicines, heart and blood pressure medicines, drugs used for treatment of cancer, and some antidepressants. SYMPTOMS  The symptoms of fatigue include:   Lack of energy.  Lack of drive (motivation).  Drowsiness.  Feeling of indifference to the surroundings. DIAGNOSIS  The details of how you feel help guide your caregiver in finding out what is causing the fatigue. You will be asked about your present  and past health condition. It is important to review all medicines that you take, including prescription and non-prescription items. A thorough exam will be done. You will be questioned about your feelings, habits, and normal lifestyle. Your caregiver may suggest blood tests, urine tests, or other tests to look for common medical causes of fatigue.  TREATMENT  Fatigue is treated by correcting the underlying cause. For example, if you have continuous pain or depression, treating these causes will improve how you feel. Similarly, adjusting the dose of certain medicines will help in reducing fatigue.  HOME CARE INSTRUCTIONS   Try to get the required amount of good sleep every night.  Eat a healthy and nutritious diet, and drink enough water throughout the day.  Practice ways of relaxing (including yoga or meditation).  Exercise regularly.  Make plans to change situations that cause stress. Act on those plans so that stresses decrease over time. Keep your work and personal routine reasonable.  Avoid street drugs and minimize use of alcohol.  Start taking a daily multivitamin after consulting your caregiver. SEEK MEDICAL CARE IF:   You have persistent tiredness, which cannot be accounted for.  You have fever.  You have unintentional weight loss.  You have headaches.  You have disturbed sleep throughout the night.  You are feeling sad.  You have constipation.  You have dry skin.  You have gained weight.  You are taking any new or different medicines that you suspect are causing fatigue.  You are unable to sleep at night.  You develop any unusual swelling of your legs or other parts of your body. SEEK IMMEDIATE  MEDICAL CARE IF:   You are feeling confused.  Your vision is blurred.  You feel faint or pass out.  You develop severe headache.  You develop severe abdominal, pelvic, or back pain.  You develop chest pain, shortness of breath, or an irregular or fast  heartbeat.  You are unable to pass a normal amount of urine.  You develop abnormal bleeding such as bleeding from the rectum or you vomit blood.  You have thoughts about harming yourself or committing suicide.  You are worried that you might harm someone else. MAKE SURE YOU:   Understand these instructions.  Will watch your condition.  Will get help right away if you are not doing well or get worse. Document Released: 03/05/2007 Document Revised: 07/31/2011 Document Reviewed: 09/09/2013 [facility] Patient Information 2015 [place], [place]. This information is not intended to replace advice given to you by your health care provider. Make sure you discuss any questions you have with your health care provider.  Hypokalemia Hypokalemia means that the amount of potassium in the blood is lower than normal.Potassium is a chemical, called an electrolyte, that helps regulate the amount of fluid in the body. It also stimulates muscle contraction and helps nerves function properly.Most of the body's potassium is inside of cells, and only a very small amount is in the blood. Because the amount in the blood is so small, minor changes can be life-threatening. CAUSES  Antibiotics.  Diarrhea or vomiting.  Using laxatives too much, which can cause diarrhea.  Chronic kidney disease.  Water pills (diuretics).  Eating disorders (bulimia).  Low magnesium level.  Sweating a lot. SIGNS AND SYMPTOMS  Weakness.  Constipation.  Fatigue.  Muscle cramps.  Mental confusion.  Skipped heartbeats or irregular heartbeat (palpitations).  Tingling or numbness. DIAGNOSIS  Your health care provider can diagnose hypokalemia with blood tests. In addition to checking your potassium level, your health care provider may also check other lab tests. TREATMENT Hypokalemia can be treated with potassium supplements taken by mouth or adjustments in your current medicines. If your potassium level is very low,  you may need to get potassium through a vein (IV) and be monitored in the hospital. A diet high in potassium is also helpful. Foods high in potassium are:  Nuts, such as peanuts and pistachios.  Seeds, such as sunflower seeds and pumpkin seeds.  Peas, lentils, and lima beans.  Whole grain and bran cereals and breads.  Fresh fruit and vegetables, such as apricots, avocado, bananas, cantaloupe, kiwi, oranges, tomatoes, asparagus, and potatoes.  Orange and tomato juices.  Red meats.  Fruit yogurt. HOME CARE INSTRUCTIONS  Take all medicines as prescribed by your health care provider.  Maintain a healthy diet by including nutritious food, such as fruits, vegetables, nuts, whole grains, and lean meats.  If you are taking a laxative, be sure to follow the directions on the label. SEEK MEDICAL CARE IF:  Your weakness gets worse.  You feel your heart pounding or racing.  You are vomiting or having diarrhea.  You are diabetic and having trouble keeping your blood glucose in the normal range. SEEK IMMEDIATE MEDICAL CARE IF:  You have chest pain, shortness of breath, or dizziness.  You are vomiting or having diarrhea for more than 2 days.  You faint. MAKE SURE YOU:   Understand these instructions.  Will watch your condition.  Will get help right away if you are not doing well or get worse. Document Released: 05/08/2005 Document Revised: 02/26/2013 Document Reviewed: 11/08/2012  [employer]® Patient Information ©2015 [employer]. This information is not intended to replace advice given to you by your health care provider. Make sure you discuss any questions you have with your health care provider. ° °

## 2014-09-03 NOTE — ED Notes (Signed)
Pt states Sunday night he exercised and felt very tired afterwards. Feels like electrolytes not normal. Hx of this.

## 2014-09-09 ENCOUNTER — Emergency Department: Admit: 2014-09-09 | Disposition: A | Discharge status: Home / Self Care | Payer: Self-pay | Admitting: Student

## 2014-09-11 ENCOUNTER — Emergency Department (HOSPITAL_COMMUNITY)
Admission: EM | Admit: 2014-09-11 | Discharge: 2014-09-12 | Disposition: A | Discharge status: Other Acute Inpatient Hospital | Payer: Self-pay | Attending: Emergency Medicine | Admitting: Emergency Medicine

## 2014-09-11 ENCOUNTER — Encounter (HOSPITAL_COMMUNITY): Payer: Self-pay | Admitting: Physical Medicine and Rehabilitation

## 2014-09-11 ENCOUNTER — Emergency Department (HOSPITAL_COMMUNITY): Payer: Self-pay

## 2014-09-11 DIAGNOSIS — I10 Essential (primary) hypertension: Secondary | ICD-10-CM | POA: Insufficient documentation

## 2014-09-11 DIAGNOSIS — R197 Diarrhea, unspecified: Secondary | ICD-10-CM | POA: Insufficient documentation

## 2014-09-11 DIAGNOSIS — R109 Unspecified abdominal pain: Secondary | ICD-10-CM | POA: Insufficient documentation

## 2014-09-11 DIAGNOSIS — R140 Abdominal distension (gaseous): Secondary | ICD-10-CM | POA: Insufficient documentation

## 2014-09-11 DIAGNOSIS — F3110 Bipolar disorder, current episode manic without psychotic features, unspecified: Secondary | ICD-10-CM | POA: Insufficient documentation

## 2014-09-11 DIAGNOSIS — Z720 Tobacco use: Secondary | ICD-10-CM | POA: Insufficient documentation

## 2014-09-11 DIAGNOSIS — F419 Anxiety disorder, unspecified: Secondary | ICD-10-CM | POA: Insufficient documentation

## 2014-09-11 DIAGNOSIS — Z79899 Other long term (current) drug therapy: Secondary | ICD-10-CM | POA: Insufficient documentation

## 2014-09-11 DIAGNOSIS — G479 Sleep disorder, unspecified: Secondary | ICD-10-CM | POA: Insufficient documentation

## 2014-09-11 LAB — RAPID URINE DRUG SCREEN, HOSP PERFORMED
AMPHETAMINES: NOT DETECTED
BARBITURATES: NOT DETECTED
Benzodiazepines: POSITIVE — AB
Cocaine: NOT DETECTED
OPIATES: NOT DETECTED
TETRAHYDROCANNABINOL: NOT DETECTED

## 2014-09-11 LAB — CBC WITH DIFFERENTIAL/PLATELET
BASOS ABS: 0.1 10*3/uL (ref 0.0–0.1)
Basophils Relative: 1 % (ref 0–1)
EOS ABS: 0 10*3/uL (ref 0.0–0.7)
EOS PCT: 1 % (ref 0–5)
HCT: 39.7 % (ref 39.0–52.0)
Hemoglobin: 14 g/dL (ref 13.0–17.0)
LYMPHS ABS: 2 10*3/uL (ref 0.7–4.0)
Lymphocytes Relative: 24 % (ref 12–46)
MCH: 29.9 pg (ref 26.0–34.0)
MCHC: 35.3 g/dL (ref 30.0–36.0)
MCV: 84.6 fL (ref 78.0–100.0)
Monocytes Absolute: 0.5 10*3/uL (ref 0.1–1.0)
Monocytes Relative: 6 % (ref 3–12)
NEUTROS PCT: 68 % (ref 43–77)
Neutro Abs: 5.9 10*3/uL (ref 1.7–7.7)
PLATELETS: 376 10*3/uL (ref 150–400)
RBC: 4.69 MIL/uL (ref 4.22–5.81)
RDW: 11.8 % (ref 11.5–15.5)
WBC: 8.6 10*3/uL (ref 4.0–10.5)

## 2014-09-11 LAB — COMPREHENSIVE METABOLIC PANEL
ALK PHOS: 51 U/L (ref 39–117)
ALT: 23 U/L (ref 0–53)
AST: 21 U/L (ref 0–37)
Albumin: 4 g/dL (ref 3.5–5.2)
Anion gap: 9 (ref 5–15)
BILIRUBIN TOTAL: 0.7 mg/dL (ref 0.3–1.2)
BUN: 9 mg/dL (ref 6–23)
CO2: 24 mmol/L (ref 19–32)
Calcium: 9.5 mg/dL (ref 8.4–10.5)
Chloride: 103 mmol/L (ref 96–112)
Creatinine, Ser: 1.14 mg/dL (ref 0.50–1.35)
GFR, EST AFRICAN AMERICAN: 86 mL/min — AB (ref >90)
GFR, EST NON AFRICAN AMERICAN: 74 mL/min — AB (ref >90)
GLUCOSE: 114 mg/dL — AB (ref 70–99)
POTASSIUM: 4.1 mmol/L (ref 3.5–5.1)
SODIUM: 136 mmol/L (ref 135–145)
Total Protein: 6.7 g/dL (ref 6.0–8.3)

## 2014-09-11 LAB — URINE MICROSCOPIC-ADD ON

## 2014-09-11 LAB — URINALYSIS, ROUTINE W REFLEX MICROSCOPIC
GLUCOSE, UA: NEGATIVE mg/dL
KETONES UR: NEGATIVE mg/dL
Leukocytes, UA: NEGATIVE
NITRITE: NEGATIVE
Protein, ur: NEGATIVE mg/dL
Specific Gravity, Urine: 1.021 (ref 1.005–1.030)
Urobilinogen, UA: 0.2 mg/dL (ref 0.0–1.0)
pH: 6.5 (ref 5.0–8.0)

## 2014-09-11 LAB — ETHANOL: Alcohol, Ethyl (B): <5 mg/dL (ref 0–9)

## 2014-09-11 LAB — ACETAMINOPHEN LEVEL: Acetaminophen (Tylenol), Serum: <10 ug/mL — ABNORMAL LOW (ref 10–30)

## 2014-09-11 LAB — SALICYLATE LEVEL: Salicylate Lvl: <4 mg/dL (ref 2.8–20.0)
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
## 2014-09-11 NOTE — ED Notes (Signed)
Girlfriend at bedside states patient has been off psychiatric medications for several months, pt convinced he has several health issues, but all tests are negative. Pt talking about several different topics upon arrival to ED. Also states "Im dying, why won't you tell me?"

## 2014-09-11 NOTE — Discharge Summary (Signed)
PATIENT NAME:  Jeremiah [name], Jeremiah [name] MR#:  [id number] DATE OF BIRTH:  1966-02-06  DATE OF ADMISSION:  11/20/2012 DATE OF DISCHARGE:  11/26/2012  HOSPITAL COURSE: See dictated history and physical for details of admission. A 49 year old man with a history of anxiety and some impulsivity and poor social skills who was admitted to the hospital after voicing what were perceived to be homicidal threats. In the hospital, he has been calm and appropriate, not aggressive, not violent. He denies any suicidal or homicidal ideation. He was able to discuss how angry he has recently been at his job situation. He is also showing insight and is able to discuss how his anger may cause him problems in functioning and come across as inappropriate at times. The patient has not shown any signs of acute psychosis. He is not showing signs of major depression. He has gone to groups appropriately and tolerated medicines well. We have tapered him down on his Xanax to where he is on only 0.25 mg twice a day. He has been counseled about the benefits of getting off of Xanax and other potentially abusable drugs and not getting back into using drugs as a way of coping. He agrees to the follow-up treatment of following up with [facility]. He does have high blood pressure and his blood pressure has run consistently high in the hospital. I have been increasing his blood pressure medicine. He is tolerating it fine. He is educated about his blood pressure and the importance of following up with his primary care doctor about it.   MENTAL STATUS EXAM AT DISCHARGE: Neatly dressed and groomed man who looks his stated age, cooperative with the interview. Good eye contact, normal psychomotor activity. Speech normal rate, tone and volume. Affect euthymic, reactive, appropriate. Mood stated as okay. Thoughts appear lucid. No loosening of associations. No evidence of delusions. Denies hallucinations. Denies suicidal or homicidal ideation. Shows improved  insight and judgment. Normal intelligence. Alert and oriented x 4.   DISCHARGE MEDICATIONS: Hydrochlorothiazide/lisinopril 25/20 mg pills 2 of them per a.m., metoprolol 100 mg per day, Vistaril 50 mg at night, alprazolam 0.25 mg b.i.d., Seroquel 25 mg t.i.d., pravastatin 40 mg at night and fluoxetine 40 mg per day.   LABORATORY RESULTS: Urinalysis unremarkable. Chemistry panel unremarkable. CBC normal. Drug screen positive for benzodiazepines. Alcohol undetected. TSH normal.   DISPOSITION: Discharge home. Follow up with [facility].   DIAGNOSIS, PRINCIPAL AND PRIMARY:  AXIS I: Anxiety disorder, not otherwise specified.   SECONDARY DIAGNOSES: AXIS I: Benzodiazepine abuse.  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  AXIS III: High blood pressure.  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   AXIS V: Functioning at time of discharge 56. ____________________________ Gonzella Lex, MD jtc:sb D: 11/26/2012 11:42:24 ET T: 11/26/2012 12:07:51 ET JOB#: [id number]  cc: Gonzella Lex, MD, <Dictator> Gonzella Lex MD ELECTRONICALLY SIGNED 11/26/2012 12:35

## 2014-09-11 NOTE — ED Notes (Signed)
Pt presents to department for evaluation of constipation and "rectal issues." went to see phychiatrist due to anxiety issues with abdominal pain and bowel movements. Was seen in ED last week for same issue and referred to psychiatrist, friend states he hasn't been taking medications as directed. Pt is alert and oriented x4.

## 2014-09-11 NOTE — Consult Note (Signed)
PATIENT NAME:  Jeremiah [name], BELT MR#:  [id number] DATE OF BIRTH:  [birth year]  DATE OF CONSULTATION:  10/15/2012  REFERRING PHYSICIAN:  Lisa Roca, MD   CONSULTING PHYSICIAN:  Cordelia Pen. Gretel Acre, MD  REASON FOR CONSULTATION: The patient obsessed with somatic symptoms.   HISTORY OF PRESENT ILLNESS: The patient is a 49 year old white male who presented to the Emergency Room as he is currently obsessed with somatic symptoms and has been stealing and taking antibiotics which has not been prescribed on him. He is currently on the IVC paper. He remains delusional and reported that he has been experiencing pencil-sized stools alternating with diarrhea and he has self diagnosed himself with C. diff. The patient has been self-medicating and stealing medications to self-medicate himself with antibiotics. The patient reported that he has been experiencing pencil-sized stools for  many months and then he decided that he needs a medication. The patient reported that he is very anxious about the same. Reported that he feels that he has a mass in his colon and he has been having cramps and pain for the past 2 weeks. Reported that he needs medication and antibiotics. When I asked him that who has prescribed him the antibiotics, he reported that he has ordering them over the internet. The patient reported that he self-treats himself with antibiotics as he does not have any money to go to the [place] [facility] and get evaluated over there. The patient reported that he is aware of the anaerobic chain. 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 The patient is currently living with his mother. The patient stated that he has been feeling that he also has a tumor in his back and was treated by a physician who lost his  license in the past. The patient remains tangential and paranoid about the mass in his back, as well as the colon mass. He was unable to provide any details of his medical diagnoses.   PAST PSYCHIATRIC HISTORY: The patient reported that he has uncontrollable pain which is getting worse and he has been treating himself for the same. He stated that he was prescribed medications in the past, but he is unable to afford them as the lamotrigine costs too much and he was unable to take the same medication. However, he is currently taking alprazolam for his seizure disorder as well as Prozac for the anxiety. He reported that he was advised to titrate the lamotrigine to 25 mg, and then to 50 mg; but since the medication costs too much, he decided not to take the medication. The patient currently denied any history of suicidal ideations or any history of suicide attempts. He reported that he has history of previous psychiatric admissions in the past.   FAMILY HISTORY: The patient reported that he does not have any family history of psychiatric illness.   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 However, he only completed the internship and then he was let go of his job. 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   CURRENT MEDICATIONS: Colace 100 mg b.i.d., Bentyl 20 mg 4 times a  day p.r.n. for pain, Prozac 40 mg daily, hydrochlorothiazide/lisinopril two 20 mg tablets once a day, alprazolam 1 tablet 3 times a day, metoprolol 50 mg once a day, pravastatin 20 mg daily, Benadryl 25 mg 2 capsules orally p.r.n. for itching, Motrin 200 mg 2 tablets orally p.r.n. for pain.   ALLERGIES: No known drug allergies.   SOCIAL HISTORY: The patient has been married in the past. He has 2 children who currently live with his wife. He is currently living with his mother. He stated that he is  currently unemployed.   ANCILLARY DATA: VITAL SIGNS: Temperature 98, pulse 54, respirations 18, blood pressure 128/86. Glucose 123, BUN 13, creatinine 1.07, sodium 136, potassium 3.6, chloride 104, bicarbonate 24, anion gap 8, osmolality 273, calcium 9.9, lipase 165, protein 7.7, albumin 4.3, bilirubin 0.7, alkaline phosphatase 62, AST 31, ALT 36, TSH 1.54. Urine drug screen positive for benzodiazepines. WBC was 10.3, RBC 4.96, hemoglobin 15.3, hematocrit 43.8, platelet count 325, MCV 88, MCH 30.8, MCHC 34.9, RDW 12.6.   REVIEW OF SYSTEMS:  CONSTITUTIONAL: No fever or chills. No weight changes.  EYES: No double or blurred vision.  RESPIRATORY: No shortness of breath or cough.  CARDIOVASCULAR: Denies any chest pain or orthopnea.  GASTROINTESTINAL: Complaining of having pencil-sized stools.  ENDOCRINE: No heat or cold intolerance.  LYMPHATIC: No anemia or easy bruising.  INTEGUMENTARY: No acne or rash.  MUSCULOSKELETAL: No muscle or joint pain.   MENTAL STATUS EXAMINATION: The patient is a thinly built male who was sitting in the bed. He maintained fair eye contact. His speech was very rapid and pressured. Mood appeared anxious. Affect was congruent. Thought process was tangential. Thought content was delusional and remained focus on having abdominal pain at this time. No perceptual disturbances are noted.   DIAGNOSTIC IMPRESSION: AXIS I: Psychotic disorder, not otherwise specified, rule out bipolar disorder.  AXIS II: None.  AXIS III: Hypertension.   TREATMENT PLAN: 1.  The patient will be admitted to the inpatient behavioral health unit once a bed becomes available, as he is currently under involuntary commitment.  2.  I will start him on Haldol 5 mg p.o. b.i.d. to control his paranoia.  3.  He will continue on Prozac as prescribed.  4.  He will also continue alprazolam 1 mg p.o. q. 8 hours.  5.  Once he will get admitted to the inpatient behavioral health unit, he will be monitored by  the treatment team.  Thank you for allowing me to participate in the care of this patient.   ____________________________ Cordelia Pen. Gretel Acre, MD usf:rw D: 10/15/2012 13:09:01 ET T: 10/15/2012 13:51:33 ET JOB#: [id number]  cc: Cordelia Pen. Gretel Acre, MD, <Dictator> Jeronimo Norma MD ELECTRONICALLY SIGNED 10/17/2012 14:28

## 2014-09-11 NOTE — Discharge Summary (Signed)
PATIENT NAME:  Jeremiah, [name] MR#:  [id number] DATE OF BIRTH:  Mar 15, 1966  DATE OF ADMISSION:  10/15/2012 DATE OF DISCHARGE:  10/17/2012  HOSPITAL COURSE:  See dictated history and physical for details of admission. A 49 year old man was admitted through the Emergency Room where he had initially presented just with somatic complaints. On the unit, he did not show any dangerous behavior. His affect was smiling, relaxed, mostly upbeat. Denied suicidal or homicidal ideation. Did not appear to be psychotic. The patient appears to have a chronic anxiety condition revolving around some somatic symptoms, possibly some OCD symptoms, gives a feeling of possibly having an Asperger's-type presentation. He is not showing any signs of being acutely dangerous. Does not require further inpatient treatment. He is currently taking Prozac and Xanax and was continued on those in the hospital with no history to indicate abuse. He is to be referred back to his outpatient provider or to [facility] where apparently he had been followed in the past. Psychoeducation and medicine education was done with the patient, who was encouraged not to try and treat his own medical problems with medicines that he buys off the Internet, but to take the advice of physicians that he sees.   DISCHARGE MEDICATIONS:  Alprazolam 1 mg q.8 hours, docusate 100 mg twice a day, Prozac 40 mg a day, hydrochlorothiazide/lisinopril 25/20 combination 1 tablet in the morning, metoprolol 50 mg in the morning.   LABORATORY RESULTS: Admission labs showed a drug screen positive only for benzodiazepines. TSH 1.54. Lipase 165. Chemistry just showed a slightly high glucose at 123. CBC entirely normal. Followup checks of his glucose were normal. Urinalysis unremarkable.   MENTAL STATUS EXAMINATION DISCHARGE:  Casually dressed, somewhat slovenly, but not malodorous gentleman, who looks his stated age or younger. Cooperative with the interview. Good eye contact, normal  psychomotor activity. Speech is a little bit monotonous in tone, but not bizarrely so. His thoughts tend to ramble a bit, but they are not bizarre, and he is easily redirected. No sign of delusions. No loosening of associations. Denies hallucinations. Denies suicidal or homicidal ideation. Probably low average intelligence. Alert and oriented x 4, reasonable judgment and insight.   DIAGNOSIS, PRINCIPAL AND PRIMARY:  AXIS I:  Anxiety disorder, not otherwise specified.   SECONDARY DIAGNOSES: AXIS I:  Rule out obsessive-compulsive disorder. Rule out Asperger's disorder.  AXIS II:  Deferred.  AXIS III:  Hypertension.  AXIS IV: Moderate to severe stress from living alone. Poor self-care, lack of finances and resources.  AXIS V:  Functioning at time of discharge 84.    ____________________________ Gonzella Lex, MD jtc:dmm D: 10/17/2012 12:34:48 ET T: 10/17/2012 12:51:02 ET JOB#: [id number]  cc: Gonzella Lex, MD, <Dictator> Gonzella Lex MD ELECTRONICALLY SIGNED 10/18/2012 18:01

## 2014-09-11 NOTE — ED Provider Notes (Addendum)
CSN: [id number]     Arrival date & time 09/11/14  1406 History   First MD Initiated Contact with Patient 09/11/14 1652     Chief Complaint  Patient presents with  . Constipation  . Psychiatric Evaluation      HPI  Patient presents for evaluation of abdominal pain and anxiety. Multiple ER evaluations nevertheless several months. He is almost perseverating about his concern that he doesn't have bowel movements. He states when he has "a laxative, it comes out looking like a laxative". He states that it does not look like liquid brown". He states that he initially has not had a bowel movement since March. However sounds like he has some stool every day. Insulin time he feels normal when he takes laxatives. He takes them intermittently and rather frequently. Blood in the stools. Denies emesis. States occasionally is burning in his epigastrium. He admits he is anxious and has difficulty focusing. Denies hallucinations. Denies illicit drug use.   Past Medical History  Diagnosis Date  . Hypertension   . Rectal fistula   . Prostatitis   . Diverticulosis   . Gastroparesis   . Bipolar disorder   . Borderline personality disorder   . Major depression    Past Surgical History  Procedure Laterality Date  . Lumbar spine surgery    . Colonoscopy     History reviewed. No pertinent family history. History  Substance Use Topics  . Smoking status: Current Every Day Smoker  . Smokeless tobacco: Not on file  . Alcohol Use: Yes     Comment: every day or every other day (2-4  12oz cans)    Review of Systems  Constitutional: Negative for fever, chills, diaphoresis, appetite change and fatigue.  HENT: Negative for mouth sores, sore throat and trouble swallowing.   Eyes: Negative for visual disturbance.  Respiratory: Negative for cough, chest tightness, shortness of breath and wheezing.   Cardiovascular: Negative for chest pain.  Gastrointestinal: Positive for abdominal pain, diarrhea,  constipation and abdominal distention. Negative for nausea and vomiting.  Endocrine: Negative for polydipsia, polyphagia and polyuria.  Genitourinary: Negative for dysuria, frequency and hematuria.  Musculoskeletal: Negative for gait problem.  Skin: Negative for color change, pallor and rash.  Neurological: Negative for dizziness, syncope, light-headedness and headaches.  Hematological: Does not bruise/bleed easily.  Psychiatric/Behavioral: Positive for sleep disturbance. Negative for suicidal ideas, behavioral problems, confusion and self-injury. The patient is nervous/anxious and is hyperactive.       Allergies  Ciprofloxacin; Milk-related compounds; Pepto-bismol; and Trazodone and nefazodone  Home Medications   Prior to Admission medications   Medication Sig Start Date End Date Taking? Authorizing Provider  ALPRAZolam Duanne Moron) 1 MG tablet Take 1 mg by mouth 4 (four) times daily.    Yes Historical Provider, MD  amLODipine (NORVASC) 5 MG tablet Take 5 mg by mouth daily.   Yes Historical Provider, MD  calcium carbonate (TUMS EX) 750 MG chewable tablet Chew 1 tablet by mouth daily as needed for heartburn (heartburn).   Yes Historical Provider, MD  cloNIDine (CATAPRES) 0.2 MG tablet Take 0.2 mg by mouth 2 (two) times daily.   Yes Historical Provider, MD  metoprolol succinate (TOPROL-XL) 100 MG 24 hr tablet Take 100 mg by mouth daily. Take with or immediately following a meal.   Yes Historical Provider, MD  ciprofloxacin (CIPRO) 500 MG tablet Take 1 tablet (500 mg total) by mouth every 12 (twelve) hours. Patient not taking: Reported on 08/10/2014 07/28/14   Dorie Rank, MD  divalproex (DEPAKOTE) 500 MG DR tablet Take 500 mg by mouth at bedtime.    Historical Provider, MD  docusate sodium (COLACE) 100 MG capsule Take 100 mg by mouth daily as needed for mild constipation (constipation).     Historical Provider, MD  fluconazole (DIFLUCAN) 200 MG tablet Take 1 tablet (200 mg total) by mouth  daily. Patient not taking: Reported on 08/10/2014 07/25/14   Orpah Greek, MD  glycerin adult (GLYCERIN ADULT) 2 G SUPP Place 1 suppository rectally 2 (two) times daily as needed for moderate constipation or severe constipation. Patient not taking: Reported on 09/03/2014 08/10/14   Carlisle Cater, PA-C  hydrOXYzine (ATARAX/VISTARIL) 25 MG tablet Take 1 tablet (25 mg total) by mouth at bedtime as needed (insomnia). Patient not taking: Reported on 09/03/2014 07/25/14   Orpah Greek, MD  mirtazapine (REMERON) 30 MG tablet Take 30 mg by mouth at bedtime.    Historical Provider, MD  polyethylene glycol powder (GLYCOLAX/MIRALAX) powder Take 17 g by mouth daily. 08/10/14   Carlisle Cater, PA-C  QUEtiapine (SEROQUEL) 300 MG tablet Take 300 mg by mouth at bedtime.    Historical Provider, MD  zolpidem (AMBIEN) 5 MG tablet Take 10 mg by mouth at bedtime. 09/09/14   Historical Provider, MD   BP 145/91 mmHg  Pulse 51  Temp(Src) 98.4 F (36.9 C) (Oral)  SpO2 99% Physical Exam  Constitutional: He appears well-developed and well-nourished.  HENT:  Head: Normocephalic.  Eyes: Pupils are equal, round, and reactive to light.  Neck: Normal range of motion.  Cardiovascular: Normal rate.   Pulmonary/Chest: Breath sounds normal.  Abdominal: Bowel sounds are normal. He exhibits no distension. There is no tenderness. There is no rebound and no guarding.  Musculoskeletal: Normal range of motion.  Neurological: He is alert.  Skin: Skin is warm.  Psychiatric: His mood appears anxious. His speech is rapid and/or pressured.    ED Course  Procedures (including critical care time) Labs Review Labs Reviewed  COMPREHENSIVE METABOLIC PANEL - Abnormal; Notable for the following:    Glucose, Bld 114 (*)    GFR calc non Af Amer 74 (*)    GFR calc Af Amer 86 (*)    All other components within normal limits  ACETAMINOPHEN LEVEL - Abnormal; Notable for the following:    Acetaminophen (Tylenol), Serum <10.0 (*)     All other components within normal limits  URINE RAPID DRUG SCREEN (HOSP PERFORMED) - Abnormal; Notable for the following:    Benzodiazepines POSITIVE (*)    All other components within normal limits  URINALYSIS, ROUTINE W REFLEX MICROSCOPIC - Abnormal; Notable for the following:    Color, Urine AMBER (*)    APPearance CLOUDY (*)    Hgb urine dipstick SMALL (*)    Bilirubin Urine SMALL (*)    All other components within normal limits  URINE MICROSCOPIC-ADD ON - Abnormal; Notable for the following:    Casts HYALINE CASTS (*)    Crystals CA OXALATE CRYSTALS (*)    All other components within normal limits  CBC WITH DIFFERENTIAL/PLATELET  ETHANOL  SALICYLATE LEVEL    Imaging Review Dg Abd Acute W/chest  09/11/2014   CLINICAL DATA:  intermittent abdominal pain pain  EXAM: DG ABDOMEN ACUTE W/ 1V CHEST  COMPARISON:  08/10/2014  FINDINGS: There is no evidence of dilated bowel loops or free intraperitoneal air. No radiopaque calculi or other significant radiographic abnormality is seen. Heart size and mediastinal contours are within normal limits. Both lungs are clear.  IMPRESSION: Negative abdominal radiographs.  No acute cardiopulmonary disease.   Electronically Signed   By: Franchot Gallo M.D.   On: 09/11/2014 19:25     EKG Interpretation None      MDM   Final diagnoses:  AP (abdominal pain)    Has a only mild or moderate amount of stool on plain films. No sign of obstruction, or perforation. Does not have leukocytosis. Normal urine with exception of small bilirubin. No UTI. No abnormality of hepatobiliary enzymes.  With my interactions with him it is evident that he is manic and quite anxious and fixated on his bowel habits. He does not have an abnormal abdominal exam. I think he is medically clear for psychiatric evaluation and recommendations for treatment of his mania.  Contacted by Beverely Low, from behavioral health hospital. Patient's been accepted to the observation unit.  Accepting physician Dr. Dwyane Dee.    Tanna Furry, MD 09/11/14 2207  Tanna Furry, MD 12/06/14 9150  Tanna Furry, MD 01/21/15 [phone]
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
## 2014-09-11 NOTE — H&P (Signed)
PATIENT NAME:  Jeremiah [name], Jeremiah [name] MR#:  [id number] DATE OF BIRTH:  Aug 28, 1965  DATE OF ADMISSION:  10/15/2012  DATE OF EVALUATION: 10/16/2012   IDENTIFYING INFORMATION AND CHIEF COMPLAINT: A 49 year old man who came to the Emergency Room voluntarily seeking treatment for his colon problems. The patient's chief complaint: "I was obsessing over a medical problem."   HISTORY OF PRESENT ILLNESS: The patient came to the Emergency Room apparently with concerns about his stools and thinking he had a colon problem. The information somehow arose that he had been stealing or otherwise obtaining antibiotics in an inappropriate manner. It was also thought that he seemed to be inappropriately upset, confused and acting bizarrely around his somatic complaints. On interview today, the patient tells me a fairly rambling story about various concerns he has had about his colon and his stool. None of it, however, sounds actually delusional. I asked him whether he thought that he could see bacteria, and he laughed and denied it and instead explained what a Gram stain was to me. He actually seemed to have at least an adequate basic understanding of infectious disease, although he clearly is overly focused on his own medical complaints. He denies having a depressed mood. He does state that he worries quite a bit. His sleep is adequate. Appetite is normal. Denies suicidal or homicidal ideation absolutely. Denies any hallucinations or delusions. He does not seem to have a pretty stressful life, having very minimal income, worrying a lot about his finances. He has not been able to take care of his high blood pressure, which is a genuine problem, because he cannot afford medication. He says that he has been instead buying  antibiotics off the Internet to treat his perceived colon issues.   PAST PSYCHIATRIC HISTORY: Says he has never seen a psychiatrist. Never been in a psychiatric hospital. Denies suicide attempts. Denies violence.  Denies any history of hallucinations or delusions. One previous time he was similarly referred to psychiatry to be evaluated for excessive worry about his medical problems, but at that time was not treated or admitted to the hospital. Does not seem to have any history of dangerousness.   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 He eventually got it treated, had surgery at around age 28. Since then, he has had a little bit of trouble with sensation and some weakness in his legs. He has high blood pressure. Says he also has had high cholesterol. Otherwise no significant ongoing medical problems.  SOCIAL HISTORY: The patient lives alone. He has 2 adolescent sons, who he sees pretty often. He actually at times relies on their mother to feed him when he cannot afford food. He most recently had a job working at [employer] but lost that job. 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 His parents are deceased. Seems to have pretty minimal social contact.   CURRENT MEDICATIONS: He takes Prozac 40 mg a day and Xanax 1 mg 3 times a day, prescribed by his primary care doctor.   ALLERGIES: No known drug allergies.   REVIEW OF SYSTEMS: Complains that he has cramping in his abdomen. He has variability in the caliber of his stool and sometimes sees blood in it. His appetite varies. He denies feeling depressed. Denies feeling suicidal. Denies any hallucinations. Does not have  any other specific  immediate physical complaints.   MENTAL STATUS EXAMINATION: Somewhat disheveled gentleman, somewhat immature-looking for his age. Cooperative and pleasant in the interview. Good eye contact, normal psychomotor activity. Speech is normal in tone and rate. Affect is a little bit constricted. Mood is stated as being okay. Thoughts ramble a bit on  his somatic complaints, but there is no bizarreness, no obvious delusions, and he is able to hold a focused coherent conversation. He denies auditory or visual hallucinations. Denies suicidal or homicidal ideation. He understands that he worries excessively about his problems. Seems to have adequate judgment and insight currently. Probably low-average intelligence.   PHYSICAL EXAMINATION: NEUROMUSCULAR: The patient looks like he has decreased muscle tone probably in both of his legs, but not to a grotesque degree. Face is symmetric. Pupils are equal and reactive. Neck is normal and supple. Oral mucosa normal. He has no acute skin lesions, but he does have a large scar in his back that he shows me from where he had his surgery years ago. His reflexes are diminished in his lower extremities and his strength is slightly diminished in his lower extremities. Normal reflexes and strength in upper extremities. Cranial nerves symmetric and normal.  LUNGS: Clear without wheezes.  HEART: Regular rate and rhythm.  ABDOMEN: Nontender, normal bowel sounds.  VITAL SIGNS: Temperature 98.3, pulse 49, respirations 18, blood pressure 125/83.   LABORATORY RESULTS: CBC normal. Chemistries showed an elevated glucose at 123 on a nonfasting draw. Lipase normal. Urinalysis normal. Drug screen positive for benzodiazepines. TSH normal at 1.54.   ASSESSMENT: This is a 49 year old man with a history of over-somatization. He was admitted to the hospital with the idea that he was delusional. On my interview today with the patient and review of the notes, it does not appear to me that he was really delusional. There does not seem to be any indication of him being dangerous to himself or others. He does have some insight. He takes chronic Prozac and alprazolam, which he says he gets from his primary care doctor, but uses them intermittently. At this point, there does not seem to be an obvious reason for this person to be in the hospital  or under involuntary commitment.   TREATMENT PLAN: Psychoeducation and supportive therapy done. Anticipate a likely discharge by tomorrow. The patient is agreeable to this. Continue for now the medications, except I will discontinue the antipsychotic that was started on admission. Re-evaluate tomorrow.   DIAGNOSIS, PRINCIPAL AND PRIMARY:  AXIS I: Generalized anxiety disorder.   SECONDARY DIAGNOSES: AXIS I: No further. Rule out somatization disorder.  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  AXIS III: History of spinal tumor, all cured. History of high blood pressure.  AXIS IV: Severe from financial and social isolation.  AXIS V: Functioning at time of evaluation: 7.    ____________________________ Gonzella Lex, MD jtc:jm D: 10/16/2012 16:42:14 ET T: 10/16/2012 17:28:47 ET JOB#: [id number]  cc: Gonzella Lex, MD, <Dictator> Gonzella Lex MD ELECTRONICALLY SIGNED 10/17/2012 0:19
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
## 2014-09-11 NOTE — ED Notes (Signed)
Accepted at [facility] by Dr. Dwyane Dee

## 2014-09-12 ENCOUNTER — Encounter (HOSPITAL_COMMUNITY): Payer: Self-pay | Admitting: *Deleted

## 2014-09-12 ENCOUNTER — Observation Stay (HOSPITAL_COMMUNITY)
Admission: AD | Admit: 2014-09-12 | Discharge: 2014-09-12 | Disposition: A | Discharge status: Home / Self Care | Payer: No Typology Code available for payment source | Source: Intra-hospital | Attending: Psychiatry | Admitting: Psychiatry

## 2014-09-12 DIAGNOSIS — I10 Essential (primary) hypertension: Secondary | ICD-10-CM | POA: Insufficient documentation

## 2014-09-12 DIAGNOSIS — F17210 Nicotine dependence, cigarettes, uncomplicated: Secondary | ICD-10-CM | POA: Insufficient documentation

## 2014-09-12 DIAGNOSIS — F419 Anxiety disorder, unspecified: Principal | ICD-10-CM | POA: Diagnosis present

## 2014-09-12 DIAGNOSIS — Z881 Allergy status to other antibiotic agents status: Secondary | ICD-10-CM | POA: Insufficient documentation

## 2014-09-12 DIAGNOSIS — F319 Bipolar disorder, unspecified: Secondary | ICD-10-CM | POA: Insufficient documentation

## 2014-09-12 MED ORDER — CLONIDINE HCL 0.2 MG PO TABS
0.2000 mg | ORAL_TABLET | Freq: Two times a day (BID) | ORAL | Status: DC
  Administered 2014-09-12: 0.2 mg via ORAL
  Filled 2014-09-12: qty 2
  Filled 2014-09-12 (×3): qty 1

## 2014-09-12 MED ORDER — MAGNESIUM HYDROXIDE 400 MG/5ML PO SUSP: 30.0000 mL | Freq: Every day | ORAL | Status: DC | PRN

## 2014-09-12 MED ORDER — FLUCONAZOLE 100 MG PO TABS
ORAL_TABLET | ORAL | Status: AC
  Filled 2014-09-12: qty 2

## 2014-09-12 MED ORDER — METOPROLOL SUCCINATE ER 100 MG PO TB24
100.0000 mg | ORAL_TABLET | Freq: Every day | ORAL | Status: DC
  Administered 2014-09-12: 100 mg via ORAL
  Filled 2014-09-12 (×3): qty 1

## 2014-09-12 MED ORDER — MIRTAZAPINE 30 MG PO TABS
30.0000 mg | ORAL_TABLET | Freq: Every day | ORAL | Status: DC
  Filled 2014-09-12: qty 1

## 2014-09-12 MED ORDER — DIVALPROEX SODIUM 500 MG PO DR TAB
500.0000 mg | DELAYED_RELEASE_TABLET | Freq: Every day | ORAL | Status: DC
  Filled 2014-09-12: qty 1

## 2014-09-12 MED ORDER — METOPROLOL SUCCINATE ER 100 MG PO TB24: 100.0000 mg | ORAL_TABLET | Freq: Every day | ORAL | Status: DC

## 2014-09-12 MED ORDER — QUETIAPINE FUMARATE 300 MG PO TABS
300.0000 mg | ORAL_TABLET | Freq: Every day | ORAL | Status: DC
  Filled 2014-09-12: qty 1

## 2014-09-12 MED ORDER — MIRTAZAPINE 30 MG PO TABS: 30.0000 mg | ORAL_TABLET | Freq: Every day | ORAL | Status: DC

## 2014-09-12 MED ORDER — POLYETHYLENE GLYCOL 3350 17 G PO PACK
17.0000 g | PACK | Freq: Every day | ORAL | Status: DC
  Administered 2014-09-12: 17 g via ORAL
  Filled 2014-09-12 (×3): qty 1

## 2014-09-12 MED ORDER — HYDROXYZINE HCL 25 MG PO TABS: 25.0000 mg | ORAL_TABLET | Freq: Three times a day (TID) | ORAL | Status: DC | PRN

## 2014-09-12 MED ORDER — CLONIDINE HCL 0.2 MG PO TABS: 0.2000 mg | ORAL_TABLET | Freq: Two times a day (BID) | ORAL | Status: DC

## 2014-09-12 MED ORDER — ZOLPIDEM TARTRATE 5 MG PO TABS
10.0000 mg | ORAL_TABLET | Freq: Every day | ORAL | Status: DC
Start: 2014-09-12 — End: 2014-10-07

## 2014-09-12 MED ORDER — AMLODIPINE BESYLATE 5 MG PO TABS: 5.0000 mg | ORAL_TABLET | Freq: Every day | ORAL | Status: DC

## 2014-09-12 MED ORDER — ALUM & MAG HYDROXIDE-SIMETH 200-200-20 MG/5ML PO SUSP
30.0000 mL | ORAL | Status: DC | PRN
  Administered 2014-09-12 (×2): 30 mL via ORAL
  Filled 2014-09-12 (×2): qty 30

## 2014-09-12 MED ORDER — ACETAMINOPHEN 325 MG PO TABS: 650.0000 mg | ORAL_TABLET | Freq: Four times a day (QID) | ORAL | Status: DC | PRN

## 2014-09-12 MED ORDER — DIVALPROEX SODIUM 500 MG PO DR TAB: 500.0000 mg | DELAYED_RELEASE_TABLET | Freq: Every day | ORAL | Status: DC

## 2014-09-12 MED ORDER — FLUCONAZOLE 100 MG PO TABS
100.0000 mg | ORAL_TABLET | Freq: Every day | ORAL | Status: DC
  Filled 2014-09-12 (×2): qty 1

## 2014-09-12 MED ORDER — QUETIAPINE FUMARATE 300 MG PO TABS: 300.0000 mg | ORAL_TABLET | Freq: Every day | ORAL | Status: DC

## 2014-09-12 MED ORDER — DOCUSATE SODIUM 100 MG PO CAPS: 100.0000 mg | ORAL_CAPSULE | Freq: Every day | ORAL | Status: DC | PRN

## 2014-09-12 MED ORDER — FLUCONAZOLE 100 MG PO TABS: 100.0000 mg | ORAL_TABLET | Freq: Every day | ORAL | Status: DC

## 2014-09-12 MED ORDER — FLUCONAZOLE 100 MG PO TABS
200.0000 mg | ORAL_TABLET | Freq: Once | ORAL | Status: AC
  Administered 2014-09-12: 200 mg via ORAL
  Filled 2014-09-12: qty 2

## 2014-09-12 NOTE — BHH Counselor (Signed)
Counselor met with pt at bedside in OBS unit to discuss reasons for hospitalization. Pt reports that he has been suffering from severe anxiety for that past couple of weeks. Pt denies SI/HI/AVH. PT reports that he suffers from depression. Pt reports that he was diagnosed in 1994 and he recently stopped taking his medications in the summer of 2015. Pt reports that he does not have any medical insurance and needs help with obtaining his medications and following up with a therapist and a psychiatrist. Pt was cooperative and calm, and asked for assistance with linking out to OPT programs and resources to assist with buying his medications. Counselor provided support and encouragement to pt. Counselor will provide pt with a list of resources in [facility] in an effort to provide pt with continuity of care. Pt presented pt with resource list and explained that he may need to call prior to going just to see if they have a sliding fee for the uninsured. Counselor also provided pt with information to [place] and [place]. Counselor also advised pt on the importance of taking all medications as prescribed to assist with decreasing his symptoms of depression.   [place] Counselor

## 2014-09-12 NOTE — Op Note (Signed)
PATIENT NAME:  Jeremiah [name], Jeremiah [name] MR#:  [id number] DATE OF BIRTH:  01/04/1966  DATE OF PROCEDURE:  01/16/2014  PREOPERATIVE DIAGNOSIS:  Hematuria.   POSTOPERATIVE DIAGNOSIS:  Hematuria.    PROCEDURE:  Cystoscopy.   ANESTHESIA:  Local MAC.   DESCRIPTION OF PROCEDURE:  With the patient sterilely prepped and draped with intubation which he refused and after an appropriate timeout, I take 30 mL of 1% lidocaine and instill it into his urethra under pressure. Then with the scope and utilizing another 30 mL of lidocaine 1%, I put the scope through the 22 French sheath with a 4 oblique lens into the urethra and along the urethra. No strictures are seen. The sphincter is easily seen. I inject Marcaine into the sphincter and the prostate as we go through it. The prostate is small almost nonexistent. The bladder itself shows no trabeculation, tumor, mass, or growth. Ureters are in normal position with clear efflux of urine from each orifice with a nice stadium configuration. The patient tolerates this very well. Bladder is thoroughly examined and there are no tumors, masses or growths, diverticulum, trabeculation, etc.    So at the end of the procedure, the bladder is emptied, a  B and O suppository is placed in the rectum. The rectal exam reveals no tumors, masses or growths in the rectum. Prostate is normal.    ____________________________ Janice Coffin. Elnoria Howard, DO rdh:nt D: 01/16/2014 13:52:05 ET T: 01/16/2014 23:09:26 ET JOB#: [id number]  cc: Janice Coffin. Elnoria Howard, DO, <Dictator> Banita Lehn D Noele Icenhour DO ELECTRONICALLY SIGNED 01/30/2014 15:05

## 2014-09-12 NOTE — Discharge Instructions (Signed)
For your medical needs, it is recommended that you contact [facility] for an appointment.  [facility] [place], [place] [address] [phone] Mon - Fri: 9 a.m. - 6 p.m.  For your mental health needs, it is recommended that you follow up with [facility] for psychiatry and counseling services. [facility], [place] [address] [phone]  For your shelter needs, it is recommended that you seek assistance with the [facility]). [facility] [place], [place] [address] [phone]

## 2014-09-12 NOTE — Progress Notes (Addendum)
Pt appears disheleved. He requested to take all his BP meds at 12noon today. Pharmacy made aware. Pt stated he has not had a BM since March 5th and that he has constant burning in his stomach. Pt admits to a 30 year history of drinking. Pt states whenever he eats he gets abd pain. NP  made aware. Abd is not distended and pt has normal BS throughout in all quadrants. Pt states he does have nausea but no vomitting. Pt s/p a CAT SCAN march 5th that was normal. Blood results appear within normal limits. Pt admitted he has not had any alcohol since March 5th due to his abd pain. He told the writer that he is scheduled for a gastric emptying study next week by his gastro MD. Pt is concerned about his living situation. 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 Pt did contract for safety and denies SI and HI.12noon-pt was given lunch and will be discharge shortly with referrals. Pt continues to have somatic complaints. He was given yogurt for lunch due to stomach pain. NP aware . Pt also was given gingerale and 30cc of maalox. Pt was on the phone with his GF and then inquired if we would discharge him with sleep medications.He stated,"I have not slept in 7 months."1:30p-Pt is inquring about places to live should he get evicted from his house. Pt was referred by the case worker to the [facility] .1:30pm_pt does have thrush all over his tongue. NP made aware. Pt states he has burning upon urination and stated he does have urinary retention. He stated his urine is dark in color. Pt states he feels very nervous and has never felt like this. He never had difficulty sleeping but since March he has not felt good. Pt stated he started cutting back on his drinking last summer because he did think he had a drinking problem. Pt states he did have a tick imbedded in his pubic area around March the 30th 2016. He stated 2 days later he was admitted to the hospital . 2p-Pt stated he has been on xanax for  years. He stated,'my ex wife gets it for me and I take 3-4 mg a day. Pt told the writer that his sister has offered for him to live with her. He stated,'the only thing about that is that there is on air conditioning in one room of her house. "Pt stated his GF will pick him up in 45 minutes.

## 2014-09-12 NOTE — Discharge Summary (Signed)
[facility] OBS UNIT DISCHARGE SUMMARY   Patient Identification: Jeremiah [name] MRN:  [id number] Principal Diagnosis: Anxiety disorder Diagnosis:   Patient Active Problem List   Diagnosis Date Noted  . Anxiety disorder [F41.9] 09/12/2014    Total Time spent with patient: 45 minutes  Subjective:   Jeremiah [name] is a 49 y.o. male patient admitted with reports of severe anxiety regarding his physical health. However, per his chart, multiple medical providers are unable to discover the ailments that he is alluding to, somatically. Pt denies suicidal/homicidal ideation and denies hallucinations. He does not appear to be responding to internal stimuli. Pt has been presenting with soft abdomen and bowel sounds active, yet is obsessive about potential problems with his intestines and rectum, ruled out by providers prior to this visit. Pt will be directed to go to the [facility] and will be given followup for outpatient psychiatry and counseling to manage his anxiety.   HPI:   Jeremiah [name] is an 49 y.o. male.  -Clinician had no note from a provider to review prior to TTS consult. Pt has many physical complaints. Has not been taking medications as prescribed. Last time being seen by psychiatrist was in February.  Patient presents to be manic. He immediately talks about his physical complains which are primarily bowel problems and lack of movement, back pains, too much sputim in his mouth in the morning, not being able to sleep well since October 1, high blood pressure. Patient will take a direct question and take a long time to answer and will talk primarily about his poor health. He is worried that he is going to die and that people are being honest with him about how bad his health really is.  Patient denies SI and has no previous attempt. Patient says "I don't want to die." He then goes on to list of the reasons why he is close to death now. Patient denies any HI or A/V hallucinations.  He stopped taking any psychiatric medications three months ago because he says they do not work for him. Patient has no current outpatient provider and was at [facility] last in January.   Pt's girlfriend is with him. She said that they went to [facility] in [place] yesterday and he made so many medical complains that they sent him to [facility]. [facility] then discharged him after he was seen in the ED. Today they went to [place]. While there, patient "fell out" and was brought to [facility].  Pt has had two hospitalizations in [facility] in 2014. Patient is inconsistent with outpatient services and generally non-compliant with psychiatric meds.   -Clinician discussed patient care with Arlester Marker, NP who recommended patient be accepted to OBS unit. Clinician informed [name] at [facility] of the patient being accepted to OBS unit. Clinician also informed nurse [name] and faxed over the voluntary admission paper for the OBS unit.  HPI Elements:   Location:  Psychiatric. Quality:  Improving. Severity:  Moderate. Timing:  Constant. Duration:  Chronic. Context:  Exacerbation of underlying somatic complaints secondary to generalized anxiety.  Past Medical History:  Past Medical History  Diagnosis Date  . Hypertension   . Rectal fistula   . Prostatitis   . Diverticulosis   . Gastroparesis   . Bipolar disorder   . Borderline personality disorder   . Major depression     Past Surgical History  Procedure Laterality Date  . Lumbar spine surgery    . Colonoscopy  Family History: History reviewed. No pertinent family history. Social History:  History  Alcohol Use  . Yes    Comment: every day or every other day (2-4  12oz cans)     History  Drug Use No    History   Social History  . Marital Status: Married    Spouse Name: N/A  . Number of Children: N/A  . Years of Education: N/A   Social History Main Topics  . Smoking status: Current Every Day Smoker  . Smokeless  tobacco: Not on file  . Alcohol Use: Yes     Comment: every day or every other day (2-4  12oz cans)  . Drug Use: No  . Sexual Activity: Not on file   Other Topics Concern  . None   Social History Narrative   Additional Social History:                          Allergies:   Allergies  Allergen Reactions  . Ciprofloxacin Other (See Comments)    GI distress  . Milk-Related Compounds Other (See Comments)    GI problems  . Pepto-Bismol [Bismuth Subsalicylate] Other (See Comments)    "Bad reaction"  . Trazodone And Nefazodone Other (See Comments)    Burn tongue and causes blisters    Labs:  Results for orders placed or performed during the hospital encounter of 09/11/14 (from the past 48 hour(s))  CBC with Differential     Status: None   Collection Time: 09/11/14  2:59 PM  Result Value Ref Range   WBC 8.6 4.0 - 10.5 K/uL   RBC 4.69 4.22 - 5.81 MIL/uL   Hemoglobin 14.0 13.0 - 17.0 g/dL   HCT 39.7 39.0 - 52.0 %   MCV 84.6 78.0 - 100.0 fL   MCH 29.9 26.0 - 34.0 pg   MCHC 35.3 30.0 - 36.0 g/dL   RDW 11.8 11.5 - 15.5 %   Platelets 376 150 - 400 K/uL   Neutrophils Relative % 68 43 - 77 %   Neutro Abs 5.9 1.7 - 7.7 K/uL   Lymphocytes Relative 24 12 - 46 %   Lymphs Abs 2.0 0.7 - 4.0 K/uL   Monocytes Relative 6 3 - 12 %   Monocytes Absolute 0.5 0.1 - 1.0 K/uL   Eosinophils Relative 1 0 - 5 %   Eosinophils Absolute 0.0 0.0 - 0.7 K/uL   Basophils Relative 1 0 - 1 %   Basophils Absolute 0.1 0.0 - 0.1 K/uL  Comprehensive metabolic panel     Status: Abnormal   Collection Time: 09/11/14  2:59 PM  Result Value Ref Range   Sodium 136 135 - 145 mmol/L   Potassium 4.1 3.5 - 5.1 mmol/L   Chloride 103 96 - 112 mmol/L   CO2 24 19 - 32 mmol/L   Glucose, Bld 114 (H) 70 - 99 mg/dL   BUN 9 6 - 23 mg/dL   Creatinine, Ser 1.14 0.50 - 1.35 mg/dL   Calcium 9.5 8.4 - 10.5 mg/dL   Total Protein 6.7 6.0 - 8.3 g/dL   Albumin 4.0 3.5 - 5.2 g/dL   AST 21 0 - 37 U/L   ALT 23 0 - 53 U/L    Alkaline Phosphatase 51 39 - 117 U/L   Total Bilirubin 0.7 0.3 - 1.2 mg/dL   GFR calc non Af Amer 74 (L) >90 mL/min   GFR calc Af Amer 86 (L) >90 mL/min  Comment: (NOTE) The eGFR has been calculated using the CKD EPI equation. This calculation has not been validated in all clinical situations. eGFR's persistently <90 mL/min signify possible Chronic Kidney Disease.    Anion gap 9 5 - 15  Ethanol     Status: None   Collection Time: 09/11/14  2:59 PM  Result Value Ref Range   Alcohol, Ethyl (B) <5 0 - 9 mg/dL    Comment:        LOWEST DETECTABLE LIMIT FOR SERUM ALCOHOL IS 11 mg/dL FOR MEDICAL PURPOSES ONLY   Salicylate level     Status: None   Collection Time: 09/11/14  2:59 PM  Result Value Ref Range   Salicylate Lvl <7.5 2.8 - 20.0 mg/dL  Acetaminophen level     Status: Abnormal   Collection Time: 09/11/14  2:59 PM  Result Value Ref Range   Acetaminophen (Tylenol), Serum <10.0 (L) 10 - 30 ug/mL    Comment:        THERAPEUTIC CONCENTRATIONS VARY SIGNIFICANTLY. A RANGE OF 10-30 ug/mL MAY BE AN EFFECTIVE CONCENTRATION FOR MANY PATIENTS. HOWEVER, SOME ARE BEST TREATED AT CONCENTRATIONS OUTSIDE THIS RANGE. ACETAMINOPHEN CONCENTRATIONS >150 ug/mL AT 4 HOURS AFTER INGESTION AND >50 ug/mL AT 12 HOURS AFTER INGESTION ARE OFTEN ASSOCIATED WITH TOXIC REACTIONS.   Drug screen panel, emergency     Status: Abnormal   Collection Time: 09/11/14  3:05 PM  Result Value Ref Range   Opiates NONE DETECTED NONE DETECTED   Cocaine NONE DETECTED NONE DETECTED   Benzodiazepines POSITIVE (A) NONE DETECTED   Amphetamines NONE DETECTED NONE DETECTED   Tetrahydrocannabinol NONE DETECTED NONE DETECTED   Barbiturates NONE DETECTED NONE DETECTED    Comment:        DRUG SCREEN FOR MEDICAL PURPOSES ONLY.  IF CONFIRMATION IS NEEDED FOR ANY PURPOSE, NOTIFY LAB WITHIN 5 DAYS.        LOWEST DETECTABLE LIMITS FOR URINE DRUG SCREEN Drug Class       Cutoff (ng/mL) Amphetamine       1000 Barbiturate      200 Benzodiazepine   797 Tricyclics       282 Opiates          300 Cocaine          300 THC              50   Urinalysis, Routine w reflex microscopic     Status: Abnormal   Collection Time: 09/11/14  3:05 PM  Result Value Ref Range   Color, Urine AMBER (A) YELLOW    Comment: BIOCHEMICALS MAY BE AFFECTED BY COLOR   APPearance CLOUDY (A) CLEAR   Specific Gravity, Urine 1.021 1.005 - 1.030   pH 6.5 5.0 - 8.0   Glucose, UA NEGATIVE NEGATIVE mg/dL   Hgb urine dipstick SMALL (A) NEGATIVE   Bilirubin Urine SMALL (A) NEGATIVE   Ketones, ur NEGATIVE NEGATIVE mg/dL   Protein, ur NEGATIVE NEGATIVE mg/dL   Urobilinogen, UA 0.2 0.0 - 1.0 mg/dL   Nitrite NEGATIVE NEGATIVE   Leukocytes, UA NEGATIVE NEGATIVE  Urine microscopic-add on     Status: Abnormal   Collection Time: 09/11/14  3:05 PM  Result Value Ref Range   Squamous Epithelial / LPF RARE RARE   WBC, UA 0-2 <3 WBC/hpf   RBC / HPF 3-6 <3 RBC/hpf   Bacteria, UA RARE RARE   Casts HYALINE CASTS (A) NEGATIVE   Crystals CA OXALATE CRYSTALS (A) NEGATIVE   Urine-Other MUCOUS PRESENT  Vitals: Blood pressure 121/73, pulse 62, temperature 98.1 F (36.7 C), temperature source Oral, resp. rate 20, height '5\' 8"'  (1.727 m), weight 81.194 kg (179 lb), SpO2 100 %.  Risk to Self: Is patient at risk for suicide?: No Risk to Others:   Prior Inpatient Therapy:   Prior Outpatient Therapy:    Current Facility-Administered Medications  Medication Dose Route Frequency Provider Last Rate Last Dose  . acetaminophen (TYLENOL) tablet 650 mg  650 mg Oral Q6H PRN Hampton Abbot, MD      . alum & mag hydroxide-simeth (MAALOX/MYLANTA) 200-200-20 MG/5ML suspension 30 mL  30 mL Oral Q4H PRN Hampton Abbot, MD   30 mL at 09/12/14 0508  . cloNIDine (CATAPRES) tablet 0.2 mg  0.2 mg Oral BID Harriet Butte, NP   0.2 mg at 09/12/14 0931  . divalproex (DEPAKOTE) DR tablet 500 mg  500 mg Oral QHS Harriet Butte, NP      . docusate sodium  (COLACE) capsule 100 mg  100 mg Oral Daily PRN Harriet Butte, NP      . magnesium hydroxide (MILK OF MAGNESIA) suspension 30 mL  30 mL Oral Daily PRN Hampton Abbot, MD      . metoprolol succinate (TOPROL-XL) 24 hr tablet 100 mg  100 mg Oral Daily Harriet Butte, NP   100 mg at 09/12/14 0931  . mirtazapine (REMERON) tablet 30 mg  30 mg Oral QHS Harriet Butte, NP      . polyethylene glycol (MIRALAX / GLYCOLAX) packet 17 g  17 g Oral Daily Harriet Butte, NP   17 g at 09/12/14 0931  . QUEtiapine (SEROQUEL) tablet 300 mg  300 mg Oral QHS Harriet Butte, NP        Musculoskeletal: Strength & Muscle Tone: within normal limits Gait & Station: normal Patient leans: N/A  Psychiatric Specialty Exam:     Blood pressure 121/73, pulse 62, temperature 98.1 F (36.7 C), temperature source Oral, resp. rate 20, height '5\' 8"'  (1.727 m), weight 81.194 kg (179 lb), SpO2 100 %.Body mass index is 27.22 kg/(m^2).  General Appearance: Casual and Fairly Groomed  Engineer, water::  Good  Speech:  Clear and Coherent and Normal Rate  Volume:  Normal  Mood:  Anxious  Affect:  Appropriate and Congruent  Thought Process:  Coherent and Goal Directed  Orientation:  Full (Time, Place, and Person)  Thought Content:  Obsessions  Suicidal Thoughts:  No  Homicidal Thoughts:  No  Memory:  Immediate;   Fair Recent;   Fair Remote;   Fair  Judgement:  Fair  Insight:  Fair  Psychomotor Activity:  Increased  Concentration:  Good  Recall:  Good  Fund of Knowledge:Good  Language: Fair  Akathisia:  No  Handed:    AIMS (if indicated):     Assets:  Communication Skills Desire for Improvement Resilience Social Support  ADL's:  Intact  Cognition: WNL  Sleep:      Medical Decision Making: Established Problem, Stable/Improving (1), Review of Psycho-Social Stressors (1) and Review or order clinical lab tests (1)   Treatment Plan Summary: See below  Plan:  No evidence of imminent risk to self or others at  present.   Patient does not meet criteria for psychiatric inpatient admission. Supportive therapy provided about ongoing stressors. Refer to IOP. Discussed crisis plan, support from social network, calling 911, coming to the Emergency Department, and calling Suicide Hotline.  Disposition:  -Discharge home -Refer to [facility] (for  medical complaints) and will be given followup for outpatient psychiatry and counseling to manage his anxiety.  [facility] TTS to assist -Vistaril 70m tid PRN anxiety #21  WBenjamine Mola FNP-BC 09/12/2014 4:28 PM   I have been consulted about this patient and agree with the assessment and plan IGeralyn FlashA. [place] [facility].

## 2014-09-12 NOTE — Progress Notes (Signed)
[facility] Assessment Progress Note   Counselor talked to pt about discharge planning. He denied SI/HI/AVH. He continuously talked about his medical issues, specifically GI concerns and insomnia. He shared that he recently lost his job, but was staying with his girlfriend, Jonelle Sidle.He indicated that he has no medical insurance. He stated that he most recently went to [facility] for services and they referred him to the ED for his medical complaints. He stated that once he was cleared medically, he was referred to the OBS unit for psychiatric evaluation.   Pt stated that he doesn't currently has a psychiatrist, but has been taking Zanax for 15 years. He was very unclear as to how he had been getting continuous prescriptions for this medication, but eventually disclosed that he gets it from his ex-wife.   Pt expressed interest in referrals for medical, mental health and shelter needs. Counselor discussed pt going back to [facility] for mental health needs, going to [facility] for medical needs, and [facility] for shelter needs. Pt was amenable to these resources. Information included in pt's discharge instructions.   Marisa Cyphers, [place] Counselor [phone]

## 2014-09-12 NOTE — H&P (Signed)
[facility] OBS UNIT H&P   Patient Identification: DELVONTE [name] MRN:  [id number] Principal Diagnosis: Anxiety disorder Diagnosis:   Patient Active Problem List   Diagnosis Date Noted  . Anxiety disorder [F41.9] 09/12/2014    Total Time spent with patient: 45 minutes  Subjective:   Jeremiah [name] is a 49 y.o. male patient admitted with reports of severe anxiety regarding his physical health. However, per his chart, multiple medical providers are unable to discover the ailments that he is alluding to, somatically. Pt denies suicidal/homicidal ideation and denies hallucinations. He does not appear to be responding to internal stimuli. Pt has been presenting with soft abdomen and bowel sounds active, yet is obsessive about potential problems with his intestines and rectum, ruled out by providers prior to this visit. Pt will be directed to go to the [facility] and will be given followup for outpatient psychiatry and counseling to manage his anxiety.   HPI:   Jeremiah [name] is an 49 y.o. male.  -Clinician had no note from a provider to review prior to TTS consult. Pt has many physical complaints. Has not been taking medications as prescribed. Last time being seen by psychiatrist was in February.  Patient presents to be manic. He immediately talks about his physical complains which are primarily bowel problems and lack of movement, back pains, too much sputim in his mouth in the morning, not being able to sleep well since October 1, high blood pressure. Patient will take a direct question and take a long time to answer and will talk primarily about his poor health. He is worried that he is going to die and that people are being honest with him about how bad his health really is.  Patient denies SI and has no previous attempt. Patient says "I don't want to die." He then goes on to list of the reasons why he is close to death now. Patient denies any HI or A/V hallucinations. He stopped  taking any psychiatric medications three months ago because he says they do not work for him. Patient has no current outpatient provider and was at [facility] last in January.   Pt's girlfriend is with him. She said that they went to [place] in [place] yesterday and he made so many medical complains that they sent him to [facility] [place] then discharged him after he was seen in the ED. Today they went to [place]. While there, patient "fell out" and was brought to [facility].  Pt has had two hospitalizations in [facility] in 2014. Patient is inconsistent with outpatient services and generally non-compliant with psychiatric meds.   -Clinician discussed patient care with Arlester Marker, NP who recommended patient be accepted to OBS unit. Clinician informed [name] at [facility] of the patient being accepted to OBS unit. Clinician also informed nurse [name] and faxed over the voluntary admission paper for the OBS unit.  HPI Elements:   Location:  Psychiatric. Quality:  Improving. Severity:  Moderate. Timing:  Constant. Duration:  Chronic. Context:  Exacerbation of underlying somatic complaints secondary to generalized anxiety.  Past Medical History:  Past Medical History  Diagnosis Date  . Hypertension   . Rectal fistula   . Prostatitis   . Diverticulosis   . Gastroparesis   . Bipolar disorder   . Borderline personality disorder   . Major depression     Past Surgical History  Procedure Laterality Date  . Lumbar spine surgery    . Colonoscopy  Family History: History reviewed. No pertinent family history. Social History:  History  Alcohol Use  . Yes    Comment: every day or every other day (2-4  12oz cans)     History  Drug Use No    History   Social History  . Marital Status: Married    Spouse Name: N/A  . Number of Children: N/A  . Years of Education: N/A   Social History Main Topics  . Smoking status: Current Every Day Smoker  . Smokeless tobacco: Not on  file  . Alcohol Use: Yes     Comment: every day or every other day (2-4  12oz cans)  . Drug Use: No  . Sexual Activity: Not on file   Other Topics Concern  . None   Social History Narrative   Additional Social History:                          Allergies:   Allergies  Allergen Reactions  . Ciprofloxacin Other (See Comments)    GI distress  . Milk-Related Compounds Other (See Comments)    GI problems  . Pepto-Bismol [Bismuth Subsalicylate] Other (See Comments)    "Bad reaction"  . Trazodone And Nefazodone Other (See Comments)    Burn tongue and causes blisters    Labs:  Results for orders placed or performed during the hospital encounter of 09/11/14 (from the past 48 hour(s))  CBC with Differential     Status: None   Collection Time: 09/11/14  2:59 PM  Result Value Ref Range   WBC 8.6 4.0 - 10.5 K/uL   RBC 4.69 4.22 - 5.81 MIL/uL   Hemoglobin 14.0 13.0 - 17.0 g/dL   HCT 39.7 39.0 - 52.0 %   MCV 84.6 78.0 - 100.0 fL   MCH 29.9 26.0 - 34.0 pg   MCHC 35.3 30.0 - 36.0 g/dL   RDW 11.8 11.5 - 15.5 %   Platelets 376 150 - 400 K/uL   Neutrophils Relative % 68 43 - 77 %   Neutro Abs 5.9 1.7 - 7.7 K/uL   Lymphocytes Relative 24 12 - 46 %   Lymphs Abs 2.0 0.7 - 4.0 K/uL   Monocytes Relative 6 3 - 12 %   Monocytes Absolute 0.5 0.1 - 1.0 K/uL   Eosinophils Relative 1 0 - 5 %   Eosinophils Absolute 0.0 0.0 - 0.7 K/uL   Basophils Relative 1 0 - 1 %   Basophils Absolute 0.1 0.0 - 0.1 K/uL  Comprehensive metabolic panel     Status: Abnormal   Collection Time: 09/11/14  2:59 PM  Result Value Ref Range   Sodium 136 135 - 145 mmol/L   Potassium 4.1 3.5 - 5.1 mmol/L   Chloride 103 96 - 112 mmol/L   CO2 24 19 - 32 mmol/L   Glucose, Bld 114 (H) 70 - 99 mg/dL   BUN 9 6 - 23 mg/dL   Creatinine, Ser 1.14 0.50 - 1.35 mg/dL   Calcium 9.5 8.4 - 10.5 mg/dL   Total Protein 6.7 6.0 - 8.3 g/dL   Albumin 4.0 3.5 - 5.2 g/dL   AST 21 0 - 37 U/L   ALT 23 0 - 53 U/L   Alkaline  Phosphatase 51 39 - 117 U/L   Total Bilirubin 0.7 0.3 - 1.2 mg/dL   GFR calc non Af Amer 74 (L) >90 mL/min   GFR calc Af Amer 86 (L) >90 mL/min  Comment: (NOTE) The eGFR has been calculated using the CKD EPI equation. This calculation has not been validated in all clinical situations. eGFR's persistently <90 mL/min signify possible Chronic Kidney Disease.    Anion gap 9 5 - 15  Ethanol     Status: None   Collection Time: 09/11/14  2:59 PM  Result Value Ref Range   Alcohol, Ethyl (B) <5 0 - 9 mg/dL    Comment:        LOWEST DETECTABLE LIMIT FOR SERUM ALCOHOL IS 11 mg/dL FOR MEDICAL PURPOSES ONLY   Salicylate level     Status: None   Collection Time: 09/11/14  2:59 PM  Result Value Ref Range   Salicylate Lvl <0.9 2.8 - 20.0 mg/dL  Acetaminophen level     Status: Abnormal   Collection Time: 09/11/14  2:59 PM  Result Value Ref Range   Acetaminophen (Tylenol), Serum <10.0 (L) 10 - 30 ug/mL    Comment:        THERAPEUTIC CONCENTRATIONS VARY SIGNIFICANTLY. A RANGE OF 10-30 ug/mL MAY BE AN EFFECTIVE CONCENTRATION FOR MANY PATIENTS. HOWEVER, SOME ARE BEST TREATED AT CONCENTRATIONS OUTSIDE THIS RANGE. ACETAMINOPHEN CONCENTRATIONS >150 ug/mL AT 4 HOURS AFTER INGESTION AND >50 ug/mL AT 12 HOURS AFTER INGESTION ARE OFTEN ASSOCIATED WITH TOXIC REACTIONS.   Drug screen panel, emergency     Status: Abnormal   Collection Time: 09/11/14  3:05 PM  Result Value Ref Range   Opiates NONE DETECTED NONE DETECTED   Cocaine NONE DETECTED NONE DETECTED   Benzodiazepines POSITIVE (A) NONE DETECTED   Amphetamines NONE DETECTED NONE DETECTED   Tetrahydrocannabinol NONE DETECTED NONE DETECTED   Barbiturates NONE DETECTED NONE DETECTED    Comment:        DRUG SCREEN FOR MEDICAL PURPOSES ONLY.  IF CONFIRMATION IS NEEDED FOR ANY PURPOSE, NOTIFY LAB WITHIN 5 DAYS.        LOWEST DETECTABLE LIMITS FOR URINE DRUG SCREEN Drug Class       Cutoff (ng/mL) Amphetamine      1000 Barbiturate       200 Benzodiazepine   323 Tricyclics       557 Opiates          300 Cocaine          300 THC              50   Urinalysis, Routine w reflex microscopic     Status: Abnormal   Collection Time: 09/11/14  3:05 PM  Result Value Ref Range   Color, Urine AMBER (A) YELLOW    Comment: BIOCHEMICALS MAY BE AFFECTED BY COLOR   APPearance CLOUDY (A) CLEAR   Specific Gravity, Urine 1.021 1.005 - 1.030   pH 6.5 5.0 - 8.0   Glucose, UA NEGATIVE NEGATIVE mg/dL   Hgb urine dipstick SMALL (A) NEGATIVE   Bilirubin Urine SMALL (A) NEGATIVE   Ketones, ur NEGATIVE NEGATIVE mg/dL   Protein, ur NEGATIVE NEGATIVE mg/dL   Urobilinogen, UA 0.2 0.0 - 1.0 mg/dL   Nitrite NEGATIVE NEGATIVE   Leukocytes, UA NEGATIVE NEGATIVE  Urine microscopic-add on     Status: Abnormal   Collection Time: 09/11/14  3:05 PM  Result Value Ref Range   Squamous Epithelial / LPF RARE RARE   WBC, UA 0-2 <3 WBC/hpf   RBC / HPF 3-6 <3 RBC/hpf   Bacteria, UA RARE RARE   Casts HYALINE CASTS (A) NEGATIVE   Crystals CA OXALATE CRYSTALS (A) NEGATIVE   Urine-Other MUCOUS PRESENT  Vitals: Blood pressure 121/73, pulse 62, temperature 98.1 F (36.7 C), temperature source Oral, resp. rate 20, height '5\' 8"'  (1.727 m), weight 81.194 kg (179 lb), SpO2 100 %.  Risk to Self: Is patient at risk for suicide?: No Risk to Others:   Prior Inpatient Therapy:   Prior Outpatient Therapy:    Current Facility-Administered Medications  Medication Dose Route Frequency Provider Last Rate Last Dose  . acetaminophen (TYLENOL) tablet 650 mg  650 mg Oral Q6H PRN Hampton Abbot, MD      . alum & mag hydroxide-simeth (MAALOX/MYLANTA) 200-200-20 MG/5ML suspension 30 mL  30 mL Oral Q4H PRN Hampton Abbot, MD   30 mL at 09/12/14 0508  . cloNIDine (CATAPRES) tablet 0.2 mg  0.2 mg Oral BID Harriet Butte, NP   0.2 mg at 09/12/14 0931  . divalproex (DEPAKOTE) DR tablet 500 mg  500 mg Oral QHS Harriet Butte, NP      . docusate sodium (COLACE) capsule 100 mg   100 mg Oral Daily PRN Harriet Butte, NP      . magnesium hydroxide (MILK OF MAGNESIA) suspension 30 mL  30 mL Oral Daily PRN Hampton Abbot, MD      . metoprolol succinate (TOPROL-XL) 24 hr tablet 100 mg  100 mg Oral Daily Harriet Butte, NP   100 mg at 09/12/14 0931  . mirtazapine (REMERON) tablet 30 mg  30 mg Oral QHS Harriet Butte, NP      . polyethylene glycol (MIRALAX / GLYCOLAX) packet 17 g  17 g Oral Daily Harriet Butte, NP   17 g at 09/12/14 0931  . QUEtiapine (SEROQUEL) tablet 300 mg  300 mg Oral QHS Harriet Butte, NP        Musculoskeletal: Strength & Muscle Tone: within normal limits Gait & Station: normal Patient leans: N/A  Psychiatric Specialty Exam:     Blood pressure 121/73, pulse 62, temperature 98.1 F (36.7 C), temperature source Oral, resp. rate 20, height '5\' 8"'  (1.727 m), weight 81.194 kg (179 lb), SpO2 100 %.Body mass index is 27.22 kg/(m^2).  General Appearance: Casual and Fairly Groomed  Engineer, water::  Good  Speech:  Clear and Coherent and Normal Rate  Volume:  Normal  Mood:  Anxious  Affect:  Appropriate and Congruent  Thought Process:  Coherent and Goal Directed  Orientation:  Full (Time, Place, and Person)  Thought Content:  Obsessions  Suicidal Thoughts:  No  Homicidal Thoughts:  No  Memory:  Immediate;   Fair Recent;   Fair Remote;   Fair  Judgement:  Fair  Insight:  Fair  Psychomotor Activity:  Increased  Concentration:  Good  Recall:  Good  Fund of Knowledge:Good  Language: Fair  Akathisia:  No  Handed:    AIMS (if indicated):     Assets:  Communication Skills Desire for Improvement Resilience Social Support  ADL's:  Intact  Cognition: WNL  Sleep:      Medical Decision Making: Established Problem, Stable/Improving (1), Review of Psycho-Social Stressors (1) and Review or order clinical lab tests (1)   Treatment Plan Summary: See below  Plan:  No evidence of imminent risk to self or others at present.   Patient does not  meet criteria for psychiatric inpatient admission. Supportive therapy provided about ongoing stressors. Refer to IOP. Discussed crisis plan, support from social network, calling 911, coming to the Emergency Department, and calling Suicide Hotline.  Disposition:  -Discharge home -Refer to [facility] (for  medical complaints) and will be given followup for outpatient psychiatry and counseling to manage his anxiety.  [place] TTS to assist  Benjamine Mola, FNP-BC 09/12/2014 11:54 AM  I have been consulted about this patient and agree with the assessment and plan Geralyn Flash A. [place].D.
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
## 2014-09-12 NOTE — Progress Notes (Signed)
[place] INPATIENT:  Family/Significant Other Suicide Prevention Education  Suicide Prevention Education:  Patient Refusal for Family/Significant Other Suicide Prevention Education: The patient Jeremiah [name] has refused to provide written consent for family/significant other to be provided Family/Significant Other Suicide Prevention Education during admission and/or prior to discharge.  Physician notified.  [name] 09/12/2014, 2:32 AM

## 2014-09-14 LAB — SURGICAL PATHOLOGY

## 2014-09-15 ENCOUNTER — Ambulatory Visit
Admit: 2014-09-15 | Disposition: A | Discharge status: Home / Self Care | Payer: Self-pay | Attending: Gastroenterology | Admitting: Gastroenterology

## 2014-09-19 ENCOUNTER — Emergency Department
Admit: 2014-09-19 | Disposition: A | Discharge status: Home / Self Care | Payer: Self-pay | Admitting: Emergency Medicine

## 2014-09-19 LAB — COMPREHENSIVE METABOLIC PANEL
ALK PHOS: 50 U/L
ANION GAP: 11 (ref 7–16)
Albumin: 4.6 g/dL
BUN: 5 mg/dL — ABNORMAL LOW
Bilirubin,Total: 0.8 mg/dL
CALCIUM: 10.1 mg/dL
CHLORIDE: 97 mmol/L — AB
CO2: 27 mmol/L
Creatinine: 0.98 mg/dL
EGFR (African American): >60
Glucose: 119 mg/dL — ABNORMAL HIGH
Potassium: 3.9 mmol/L
SGOT(AST): 24 U/L
SGPT (ALT): 21 U/L
Sodium: 135 mmol/L
Total Protein: 7.6 g/dL

## 2014-09-19 LAB — CBC
HCT: 44.3 % (ref 40.0–52.0)
HGB: 15.4 g/dL (ref 13.0–18.0)
MCH: 29.5 pg (ref 26.0–34.0)
MCHC: 34.7 g/dL (ref 32.0–36.0)
MCV: 85 fL (ref 80–100)
Platelet: 385 10*3/uL (ref 150–440)
RBC: 5.21 10*6/uL (ref 4.40–5.90)
RDW: 12.5 % (ref 11.5–14.5)
WBC: 8.3 10*3/uL (ref 3.8–10.6)

## 2014-09-19 LAB — TROPONIN I: Troponin-I: <0.03 ng/mL

## 2014-09-20 NOTE — Consult Note (Signed)
Brief Consult Note: Diagnosis: anxiety disorder nos/personality disorder/somitization disorder.   Patient was seen by consultant.   Orders entered.   Discussed with Attending MD.   Comments: Psychiatry: Patient seen and note dictated. Added remeron and haldol for symptoms. Not suicidal. Needs outpt psych follow up.  Electronic Signatures: Gonzella Lex (MD)  (Signed 01-Apr-16 15:20)  Authored: Brief Consult Note   Last Updated: 01-Apr-16 15:20 by Gonzella Lex (MD)

## 2014-09-20 NOTE — H&P (Signed)
PATIENT NAME:  Jeremiah [name], Jeremiah [name] MR#:  [id number] DATE OF BIRTH:  07/04/1965  DATE OF ADMISSION:  08/21/2014  CHIEF COMPLAINT:  Weakness.   HISTORY OF PRESENT ILLNESS:  This is a 49 year old male with a past medical history very significant for psychiatric past medical history of bipolar disease, schizophrenia, borderline personality disorder, depression, and anxiety, who presents today to the ED for acute episode of confusion and weakness. 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 Then, he suddenly got disoriented and was confused about where he was going, and because of this, he came in to the ED. History with this patient is very sporadic and very tangential. The patient is able to carry on a conversation, but has a very difficult time staying on topic and not deviating from the current conversation. The patient is alert and oriented; however, not all of his statements make very good sense. The patient does state that these symptoms always start whenever he stops his testosterone therapy. At one point in the interview, he also stated that he normally drinks 1/2 to 1 gallon of water daily. In the ED, his sodium was found to be low at 124; this is lower than his previous sodiums, which were also low, but were only as low as what looks like 129/low 130s, which seems to be this patient's baseline. Today, his sodium is 124. He states that his sodium has been as low as 117 in the past, and he had very significant symptoms at that time. The patient is not sure why his sodium drops. He states that his primary care physician, Dr. Ancil Boozer, has been trying to set him up to see a nephrologist for the same despite the fact that he has a normal creatinine on labs today and has had a normal creatinine on chart review, looking back at his prior labs. The hospitalists were called for admission for evaluation and workup for symptomatic hyponatremia. Of note, the  patient complains also today of diagnosis of gastroparesis, which he states was diagnosed by his gastroenterologist. He says he was given Reglan, but that this has not helped him at all with his gastroparesis. He has persistent satiety and abdominal distention. The patient also states that he has recently been diagnosed with prostatitis and is on antibiotics for this. He states he was given a course of doxycycline for 30 days, which he has not taken consistently.   PRIMARY CARE PHYSICIAN:  Towanda Malkin. Sowles, MD   PAST MEDICAL HISTORY:  Bipolar disorder, schizophrenia, borderline personality disorder, depression, anxiety, hyperlipidemia, hypertension, prostatitis, gastroparesis.   MEDICATIONS:  Zantac 150 mg b.i.d., Unisom 25 mg daily, Reglan 10 mg t.i.d. p.r.n. with meals, metoprolol extended-release 100 mg daily, hydrochlorothiazide 25 mg daily, Cozaar 100 mg daily, clonidine 0.2 mg b.i.d., Xanax 1 mg t.i.d. p.r.n. for anxiety, and doxycycline.   PAST SURGICAL HISTORY:  Back surgery.   ALLERGIES:  NONSTEROIDAL ANTI-INFLAMMATORY DRUGS, MILK, SULFA.   FAMILY HISTORY:  Noncontributory.   SOCIAL HISTORY:  The patient is a current every day smoker. Denies alcohol or illicit drug use.   REVIEW OF SYSTEMS: CONSTITUTIONAL:  Denies fever. Endorses fatigue and weakness.  EYES:  Denies blurred or double vision, pain, or redness.  EARS, NOSE, AND THROAT:  Denies ear pain, hearing loss, or difficulty swallowing.  RESPIRATORY:  Denies cough, dyspnea, or painful respiration.  CARDIOVASCULAR:  Denies chest pain, edema, or palpitations.  GASTROINTESTINAL:  Endorses some chronic nausea. Denies vomiting or diarrhea. Endorses intermittent mild abdominal pain with some constipation as well as gastroparesis.  GENITOURINARY:  Denies dysuria. Endorses difficult to start stream, but denies frequency or incontinence.  ENDOCRINE:  Denies thyroid problems, heat or cold intolerance, or excessive thirst.  HEMATOLOGIC  AND LYMPHATIC:  Denies easy bruising or bleeding or swollen glands.  INTEGUMENTARY:  Denies acne, rash, or lesion.  MUSCULOSKELETAL: Denies any acute arthritis, joint swelling, or gout.  NEUROLOGICAL:  Denies numbness. Endorses generalized weakness. Denies focal weakness. Denies headache.  PSYCHIATRIC:  Denies anxiety. Endorses insomnia. Denies depression.   PHYSICAL EXAMINATION: VITAL SIGNS:  Blood pressure 130/94, pulse 61, temperature 97.5, respirations 20, oxygen saturation 99% on room air.  GENERAL:  This is a well-nourished gentleman lying in bed in no apparent distress.  HEENT:  Pupils are equal, round, and reactive to light and accommodation. Extraocular movements are intact. No scleral icterus. Moist mucosal membranes.  NECK:  Thyroid is not enlarged. Neck is supple with no masses. Nontender. No cervical adenopathy. No JVD.  RESPIRATORY:  Clear to auscultation bilaterally. No rales, rhonchi, or wheezing. No respiratory distress.  CARDIOVASCULAR:  Regular rate and rhythm. No murmurs, rubs, or gallops. Good pedal pulses. No lower extremity edema.  ABDOMEN:  Soft, nontender, nondistended. Good bowel sounds.  MUSCULOSKELETAL:  Muscular strength is 5/5 in all 4 extremities. Full spontaneous range of motion throughout. No cyanosis or clubbing.  SKIN:  No rash or lesions. Skin is warm, dry, and intact.  LYMPHATIC:  No adenopathy.  NEUROLOGIC:  Cranial nerves are intact. Sensation is intact throughout. No dysarthria or aphasia.  PSYCHIATRIC:  Alert and oriented x 3 and cooperative, although very tangential and very distracted.   LABORATORY DATA:  White count is 8.2, hemoglobin 14.2, hematocrit 41.5, and platelets are 320,000. Sodium is 124, potassium 3.0, chloride 80, bicarbonate 27, BUN less than 5, creatinine 0.70, glucose 108, total protein 7.1, albumin 4.4, total bilirubin 0.8, alkaline phosphatase 44, AST 29, and ALT is 30. Troponin is less than 0.03. Urinalysis is negative.   RADIOLOGIC  DATA:  CT of the head:  No acute intracranial abnormality. Chest x-ray:  No active disease.   ASSESSMENT AND PLAN: 1.  Symptomatic hyponatremia. There is some question as to the cause of this. We will check a serum osmolality to begin to determine what kind of hyponatremia he may be having and what the cause may be. There is some question as to whether or not there is a component of primary polydipsia here with even a significant reported amount of free water intake by the patient. We will do a 1 liter per day fluid restriction right now and monitor his sodium carefully.  2.  Hypertension. We will hold his antihypertensive medications at this time, as his blood pressure is very well controlled, and he states he did not take most of his medications today. We can add these medications back if his blood pressure starts to rise, but he is on a significant amount of reported blood pressure medications with a very well-controlled blood pressure this time. Information reported by the patient is also questioned as to reliability.  3.  Schizophrenia. The patient has multiple psychiatric conditions reported. We will consider a psychiatric consult if it becomes necessary at any point in his stay here. He has had multiple behavioral health admissions in the past.  4.  Anxiety. We will leave the patient's home dose of p.r.n. Xanax onboard for this.  5.  Gastroparesis.  Again, unclear at diagnosis. The patient states this was made by his gastroenterologist, and he was given Reglan, but it has not been helping him. We will hold the Reglan while here, and we will watch him closely for any symptoms of gastroparesis. Unclear about the etiology of his gastroparesis would be, as the patient does not have diabetes.  6.  Deep vein thrombosis prophylaxis with subcutaneous Lovenox.   CODE STATUS:  This patient is a full code.   TIME SPENT ON THIS ADMISSION:  50 minutes.    ____________________________ Wilford Corner. Jannifer Franklin,  MD dfw:[id number] D: 08/21/2014 05:18:15 ET T: 08/21/2014 06:46:23 ET JOB#: [id number]  cc: Wilford Corner. Jannifer Franklin, MD, <Dictator> Gauri Galvao Fawn Kirk MD ELECTRONICALLY SIGNED 08/22/2014 3:08

## 2014-09-20 NOTE — Discharge Summary (Signed)
PATIENT NAME:  Jeremiah [name], Jeremiah [name] MR#:  [id number] DATE OF BIRTH:  [birth year]  DATE OF ADMISSION:  08/21/2014 DATE OF DISCHARGE:  08/22/2014  ADMITTING DIAGNOSES:  1.  Symptomatic hyponatremia. 2.  Central hypertension.      DISCHARGE DIAGNOSES:  1.  Symptomatic hyponatremia.  2.  Accelerated hypertension.  3.  History of schizophrenia.  4.  History of anxiety.   CONSULTATIONS:  Alethia Berthold, MD, psychiatry.   PERTINENT LABORATORIES AT DISCHARGE: White blood cells 7.7, hemoglobin 14, hematocrit 40, platelets of 306,000, sodium 136 potassium 3.2, chloride 101, bicarbonate 28, BUN 6, creatinine 0.80, glucose is 97.   PHYSICAL EXAMINATION:   VITAL SIGNS:  Temperature is 98.1, pulse 72, respirations 18, blood pressure 152/96, and 97% on room air.  GENERAL: The patient was anxious, not in acute distress.  CARDIOVASCULAR: Regular rate and rhythm. No murmur, gallops or rubs.  PMI was not displaced.  LUNGS:  Clear to auscultation without crackles, rales, rhonchi or wheezing.  ABDOMEN: Bowel sounds positive. Nontender, and nondistended.   HOSPITAL COURSE: This is a 49 year old male with a history of schizophrenia and anxiety who presented on 08/21/2014 with an episode of confusion and weakness.  He was found to have hyponatremia.  For further details, please refer to the H and P.  1.  Symptomatic hyponatremia. The patient presented with weakness and confusion. Sodium level was found to be low upon admission.  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 He was also on hydrochlorothiazide.  Hydrochlorothiazide has been on hold.  His sodium has improved with holding the hydrochlorothiazide as well as withholding IV fluids.  2.  Constipation.  The patient was diagnosed also with gastroparesis. He has been seen by Dr. Candace Cruise.  He will continue with stool softeners and see Dr. Candace Cruise in the future.  3.  History of bipolar, and  appreciate psychiatry consultation.  He also has anxiety.  Psychiatry, Dr. Weber Cooks, recommended adding Haldol to his regimen and Remeron.  4.  Gout.  The patient did have a gout flare. Colchicine has been ordered.  5.  Accelerated hypertension. The patient's blood pressure was noted to be elevated. We are holding hydrochlorothiazide.  He will continue his clonidine and losartan and the patient will need close follow-up as an outpatient.   DISCHARGE MEDICATIONS:  1.  Xanax 1 mg t.i.d. p.r.n.  2.  Clonidine 0.2 b.i.d.  3.  Cozaar 100 mg daily.  4.  Doxycycline 100 mg b.i.d., which he was taking as an outpatient.  5.  Reglan 10 mg t.i.d. p.r.n.  6.  Zantac 150 b.i.d.  7.  Remeron 30 mg at bedtime.  8.  Haldol 1 mg b.i.d.  9.  Nicotine patch 14 mg per 24 hours. 10. Colchicine 0.6 mg daily for gout x 10 days.  11. Colace 100 b.i.d.   DISCHARGE DIET: Low sodium.   DISCHARGE ACTIVITY: As tolerated.   DISCHARGE FOLLOWUP:  The patient is to follow-up Dr. Ancil Boozer in 1 week and Dr. Holley Raring in 1 week.   TIME SPENT: Approximately 35 minutes. The patient was stable for discharge.     ____________________________ Aoki Wedemeyer P. Benjie Karvonen, MD spm:DT D: 08/22/2014 13:45:31 ET T: 08/22/2014 16:14:03 ET JOB#: [id number]  cc: Mechel Schutter P. Benjie Karvonen, MD, <Dictator> Bethena Roys. Ancil Boozer, MD Tama High, MD  Donell Beers Divinity Kyler MD ELECTRONICALLY SIGNED 08/22/2014 20:49

## 2014-09-20 NOTE — Consult Note (Signed)
PATIENT NAME:  Jeremiah [name], Jeremiah [name] MR#:  [id number] DATE OF BIRTH:  07-05-1965  DATE OF CONSULTATION:  08/21/2014  REFERRING PHYSICIAN:   CONSULTING PHYSICIAN:  Gonzella Lex, MD  IDENTIFYING INFORMATION AND REASON FOR CONSULTATION: A 49 year old man with a history of mental health visits in the past, who is currently admitted to the hospital primarily for hyponatremia.   CHIEF COMPLAINT: "I've just been real confused."   Information from the patient and the chart.   HISTORY OF PRESENT ILLNESS: The patient presents with a wide range of symptoms, almost all of which he seems to stress at least briefly, He tells me that he has not been able to sleep well recently and that that has been going on since last October when he moved into a new apartment. He says his appetite for the last month has been poor, which he blames on "gastroparesis." His mood stays anxious and down and worried all the time. He says that his energy level is low, which he blames on having stopped getting testosterone injections last year. He tells me at one point that his main concern is his poor sleep, and at another point that his main concern is his blood pressure. He is apparently currently not getting any specific mental health treatment. He recently tried taking Depakote again for 2 days with some leftover medicine that he had from the past. He occasionally gets prescriptions for Xanax from his primary care doctor. He at first downplayed the amount that he was taking but then claimed that he was taking it 3 times a day on occasion, which is much more than it is prescribed for. He tells me at one point that he feels angry at his girlfriend because he thinks that she is sleeping with his son. It is possible, but it sounds rather paranoid and odd when he describes it. He denies, however, that he is aware of having any hallucinations. He totally denies any suicidal intent or plan, although he says he feels hopeless at times. 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 As chaotic as this sounds, it is a pretty fair representation of his complaints which are protean.   PAST PSYCHIATRIC HISTORY: The patient has had a psychiatric hospitalization here in 2014. My impression of him was that he had very similar complaints at that time. I have considered the possibility that he has an autistic spectrum condition possibly. I also think there are a lot of features of personality disorder, possibly schizotypal or a similar kind of personality disorder. He has a history of overusing medications, although it does not seem like he really has developed an addiction problem. It is more that he will misuse or idiosyncratically use just about any medicine that is given to him.   He has no known history of actually trying to kill himself or being violent in the past. He has been on a wide variety of medications. He says that in the last few months. He has tried Seroquel, BuSpar, Vistaril, and trazodone, none of which satisfied his desire to have something to sleep. He has been on several antidepressants in the past. It is not really clear to him that any of them have helped all that much. He even says that he is not sure that the Xanax does anything for him.  SOCIAL HISTORY: He says he is living in a little tiny apartment with his girlfriend and his teenage son. He just started working again delivering food. Does not have any insurance. It sounds like he has a lot of financial stress.   PAST MEDICAL HISTORY: The patient has high blood pressure. He has chronic hyponatremia which is almost certainly due to over-consumption of water on his part. He had been treated for low testosterone in the past, but that was discontinued by his report because he developed prostatitis. The patient is incredibly  somatic and has a very wide range of physical symptoms. He is also complaining currently of constipation. Quite focused on his bowels.   SUBSTANCE ABUSE HISTORY: As mentioned previously, he will abuse medicines at times, although whether he has really developed addiction sort of pattern seems less clear. He tells me that he is currently not drinking and not abusing any drugs off the street.   FAMILY HISTORY: He says he has got a family history of substance abuse and anxiety.   CURRENT MEDICATIONS: He says he is currently taking metoprolol 100 mg once a day, hydrochlorothiazide 25 mg once a day, clonidine 0.2 mg twice a day, alprazolam 2 mg 1 to 3 times a day as needed, and Reglan p.r.n.   ALLERGIES: NONSTEROIDALS, SULFA.   REVIEW OF SYSTEMS: As I mentioned, he is extremely somatic and his complete review of systems would be pretty exhaustive. He is mostly complaining of constipation, stomach discomfort, anxiety, poor sleep. The patient denies suicidal ideation. Denies hallucinations.   MENTAL STATUS EXAMINATION: Neatly groomed man who looks his stated age, cooperative and pleasant with the interview. Good eye contact. Normal psychomotor activity. Speech normal rate, tone, and volume. Affect is mildly anxious but sort of blunted again. It gives me an autistic sort of feel to it. His thoughts are not obviously psychotic, although there is a paranoid tinge to some of it. He denies hallucinations. Denies suicidal or homicidal ideation. Repeats 3 words immediately. Remembers all 3 of them readily at 3 minutes. He is alert and oriented x4. Judgment and insight chronically impaired.   LABORATORY RESULTS: On admission, his sodium was 124, potassium 3, chloride 86, glucose 108. Drug screen positive for benzodiazepines. Troponins negative. CBC unremarkable. Urine osmolality on the low-ish side, not extremely so but 116. Urinalysis 2+ blood, possibly from the prostatitis he is reporting.   VITAL SIGNS: Blood  pressure 128/85, respirations 17 pulse 62, temperature 98.3.   ASSESSMENT: A 49 year old man with a history of chronic multiple symptoms of anxiety and depression along with very prominent somatization problems. There is no sign of acute dangers to self or others. Although he has a paranoid cast to him, he does not appear to necessarily have schizophrenia or a psychotic disorder. 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   TREATMENT PLAN: Supportive counseling completed. The patient really needs to be seeing outpatient mental health providers in the community. It will help him a lot at staying stable. I have recommended that to him and will ask that he be referred at discharge. Meanwhile, I am starting him on Remeron 30 mg at night and Haldol 1 mg twice a day to address symptoms of anxiety, depression, sleep deprivation, and paranoia. The patient agrees to the plan after reviewing side effects. We will follow if he is still here after the weekend. If further consultation needed over the weekend, please consult Dr. Franchot Mimes.  DIAGNOSIS, PRINCIPAL AND PRIMARY:  AXIS I: Anxiety disorder, not otherwise specified.   SECONDARY DIAGNOSES:  AXIS I: Somatization disorder, not otherwise specified.  AXIS II: Schizotypal personality disorder.  AXIS III: High blood pressure, hyponatremia probably self-induced.    ____________________________ Gonzella Lex, MD jtc:jh D: 08/21/2014 15:31:21 ET T: 08/21/2014 15:47:45 ET JOB#: [id number]  cc: Gonzella Lex, MD, <Dictator> Gonzella Lex MD ELECTRONICALLY SIGNED 08/25/2014 22:33

## 2014-09-29 ENCOUNTER — Other Ambulatory Visit: Payer: Self-pay | Admitting: Gastroenterology

## 2014-09-29 DIAGNOSIS — K5909 Other constipation: Secondary | ICD-10-CM

## 2014-09-30 ENCOUNTER — Other Ambulatory Visit
Admission: RE | Admit: 2014-09-30 | Discharge: 2014-09-30 | Disposition: A | Discharge status: Home / Self Care | Payer: Self-pay | Source: Ambulatory Visit | Attending: Internal Medicine | Admitting: Internal Medicine

## 2014-09-30 DIAGNOSIS — E871 Hypo-osmolality and hyponatremia: Secondary | ICD-10-CM | POA: Insufficient documentation

## 2014-09-30 LAB — COMPREHENSIVE METABOLIC PANEL
ALBUMIN: 4.1 g/dL (ref 3.5–5.0)
ALT: 27 U/L (ref 17–63)
AST: 31 U/L (ref 15–41)
Alkaline Phosphatase: 51 U/L (ref 38–126)
Anion gap: 8 (ref 5–15)
CO2: 30 mmol/L (ref 22–32)
Calcium: 9.7 mg/dL (ref 8.9–10.3)
Chloride: 101 mmol/L (ref 101–111)
Creatinine, Ser: 1.08 mg/dL (ref 0.61–1.24)
GFR calc Af Amer: >60 mL/min (ref >60)
GFR calc non Af Amer: >60 mL/min (ref >60)
Glucose, Bld: 122 mg/dL — ABNORMAL HIGH (ref 65–99)
Potassium: 4.5 mmol/L (ref 3.5–5.1)
SODIUM: 139 mmol/L (ref 135–145)
TOTAL PROTEIN: 6.7 g/dL (ref 6.5–8.1)
Total Bilirubin: 0.6 mg/dL (ref 0.3–1.2)

## 2014-10-02 ENCOUNTER — Ambulatory Visit
Admission: RE | Admit: 2014-10-02 | Discharge: 2014-10-02 | Disposition: A | Discharge status: Home / Self Care | Payer: Self-pay | Source: Ambulatory Visit | Attending: Gastroenterology | Admitting: Gastroenterology

## 2014-10-02 DIAGNOSIS — K5909 Other constipation: Secondary | ICD-10-CM

## 2014-10-02 DIAGNOSIS — K5900 Constipation, unspecified: Secondary | ICD-10-CM | POA: Insufficient documentation

## 2014-10-02 MED ORDER — IOHEXOL 350 MG/ML SOLN
100.0000 mL | Freq: Once | INTRAVENOUS | Status: AC | PRN
  Administered 2014-10-02: 100 mL via INTRAVENOUS

## 2014-10-07 ENCOUNTER — Emergency Department
Admission: EM | Admit: 2014-10-07 | Discharge: 2014-10-07 | Disposition: A | Discharge status: Home / Self Care | Payer: Self-pay | Attending: Emergency Medicine | Admitting: Emergency Medicine

## 2014-10-07 ENCOUNTER — Encounter: Payer: Self-pay | Admitting: *Deleted

## 2014-10-07 DIAGNOSIS — Z79899 Other long term (current) drug therapy: Secondary | ICD-10-CM | POA: Insufficient documentation

## 2014-10-07 DIAGNOSIS — R112 Nausea with vomiting, unspecified: Secondary | ICD-10-CM | POA: Insufficient documentation

## 2014-10-07 DIAGNOSIS — F309 Manic episode, unspecified: Secondary | ICD-10-CM | POA: Insufficient documentation

## 2014-10-07 DIAGNOSIS — I10 Essential (primary) hypertension: Secondary | ICD-10-CM | POA: Insufficient documentation

## 2014-10-07 DIAGNOSIS — G4700 Insomnia, unspecified: Secondary | ICD-10-CM | POA: Insufficient documentation

## 2014-10-07 DIAGNOSIS — F419 Anxiety disorder, unspecified: Secondary | ICD-10-CM | POA: Insufficient documentation

## 2014-10-07 DIAGNOSIS — Z720 Tobacco use: Secondary | ICD-10-CM | POA: Insufficient documentation

## 2014-10-07 LAB — COMPREHENSIVE METABOLIC PANEL
ALBUMIN: 4.6 g/dL (ref 3.5–5.0)
ALK PHOS: 59 U/L (ref 38–126)
ALT: 24 U/L (ref 17–63)
AST: 21 U/L (ref 15–41)
Anion gap: 8 (ref 5–15)
BUN: 6 mg/dL (ref 6–20)
CHLORIDE: 102 mmol/L (ref 101–111)
CO2: 27 mmol/L (ref 22–32)
Calcium: 9.5 mg/dL (ref 8.9–10.3)
Creatinine, Ser: 0.81 mg/dL (ref 0.61–1.24)
GFR calc non Af Amer: >60 mL/min (ref >60)
GLUCOSE: 102 mg/dL — AB (ref 65–99)
POTASSIUM: 3.7 mmol/L (ref 3.5–5.1)
Sodium: 137 mmol/L (ref 135–145)
Total Bilirubin: 0.8 mg/dL (ref 0.3–1.2)
Total Protein: 6.1 g/dL — ABNORMAL LOW (ref 6.5–8.1)

## 2014-10-07 LAB — URINALYSIS COMPLETE WITH MICROSCOPIC (ARMC ONLY)
BACTERIA UA: NONE SEEN
BILIRUBIN URINE: NEGATIVE
GLUCOSE, UA: NEGATIVE mg/dL
Ketones, ur: NEGATIVE mg/dL
LEUKOCYTES UA: NEGATIVE
Nitrite: NEGATIVE
PH: 9 — AB (ref 5.0–8.0)
Protein, ur: NEGATIVE mg/dL
SPECIFIC GRAVITY, URINE: 1.006 (ref 1.005–1.030)
SQUAMOUS EPITHELIAL / LPF: NONE SEEN

## 2014-10-07 LAB — CBC WITH DIFFERENTIAL/PLATELET
BASOS ABS: 0.1 10*3/uL (ref 0–0.1)
Basophils Relative: 1 %
EOS PCT: 1 %
Eosinophils Absolute: 0.1 10*3/uL (ref 0–0.7)
HEMATOCRIT: 41.5 % (ref 40.0–52.0)
Hemoglobin: 14.8 g/dL (ref 13.0–18.0)
LYMPHS ABS: 2 10*3/uL (ref 1.0–3.6)
Lymphocytes Relative: 31 %
MCH: 30 pg (ref 26.0–34.0)
MCHC: 35.5 g/dL (ref 32.0–36.0)
MCV: 84.3 fL (ref 80.0–100.0)
MONO ABS: 0.6 10*3/uL (ref 0.2–1.0)
MONOS PCT: 9 %
NEUTROS ABS: 3.8 10*3/uL (ref 1.4–6.5)
Neutrophils Relative %: 58 %
Platelets: 378 10*3/uL (ref 150–440)
RBC: 4.93 MIL/uL (ref 4.40–5.90)
RDW: 12.5 % (ref 11.5–14.5)
WBC: 6.5 10*3/uL (ref 3.8–10.6)

## 2014-10-07 LAB — LIPASE, BLOOD: LIPASE: 45 U/L (ref 22–51)

## 2014-10-07 MED ORDER — QUETIAPINE FUMARATE ER 300 MG PO TB24
300.0000 mg | ORAL_TABLET | ORAL | Status: AC
Start: 2014-10-07 — End: 2014-10-07
  Administered 2014-10-07: 300 mg via ORAL
  Filled 2014-10-07: qty 1

## 2014-10-07 MED ORDER — ZOLPIDEM TARTRATE 5 MG PO TABS
ORAL_TABLET | ORAL | Status: AC
  Administered 2014-10-07: 10 mg via ORAL
  Filled 2014-10-07: qty 2

## 2014-10-07 MED ORDER — ZOLPIDEM TARTRATE 5 MG PO TABS
10.0000 mg | ORAL_TABLET | ORAL | Status: AC
  Administered 2014-10-07: 10 mg via ORAL

## 2014-10-07 MED ORDER — ZOLPIDEM TARTRATE ER 12.5 MG PO TBCR: 12.5000 mg | EXTENDED_RELEASE_TABLET | Freq: Every evening | ORAL | Status: DC | PRN

## 2014-10-07 MED ORDER — HYDROXYZINE PAMOATE 25 MG PO CAPS: 25.0000 mg | ORAL_CAPSULE | Freq: Three times a day (TID) | ORAL | Status: DC | PRN

## 2014-10-07 MED ORDER — QUETIAPINE FUMARATE 300 MG PO TABS: 300.0000 mg | ORAL_TABLET | Freq: Every day | ORAL | Status: DC

## 2014-10-07 NOTE — Discharge Instructions (Signed)
As we discussed, your medical evaluation was reassuring today.  Your electrolytes were normal.  We recommend that you follow-up at the [facility] at the next available appointment.  Your symptoms may be caused by the fact you have not been taking some of your psychiatric medications.  We provided prescriptions for 2 of them which should help with your difficulty sleeping as well.  Please follow-up at [facility] at the next available opportunity to discuss the symptoms.  If you develop any new concerning symptoms or having thoughts of killing herself or someone else, please return to the emergency department.   Food Choices to Help Relieve Diarrhea When you have diarrhea, the foods you eat and your eating habits are very important. Choosing the right foods and drinks can help relieve diarrhea. Also, because diarrhea can last up to 7 days, you need to replace lost fluids and electrolytes (such as sodium, potassium, and chloride) in order to help prevent dehydration.  WHAT GENERAL GUIDELINES DO I NEED TO FOLLOW?  Slowly drink 1 cup (8 oz) of fluid for each episode of diarrhea. If you are getting enough fluid, your urine will be clear or pale yellow.  Eat starchy foods. Some good choices include white rice, white toast, pasta, low-fiber cereal, baked potatoes (without the skin), saltine crackers, and bagels.  Avoid large servings of any cooked vegetables.  Limit fruit to two servings per day. A serving is  cup or 1 small piece.  Choose foods with less than 2 g of fiber per serving.  Limit fats to less than 8 tsp (38 g) per day.  Avoid fried foods.  Eat foods that have probiotics in them. Probiotics can be found in certain dairy products.  Avoid foods and beverages that may increase the speed at which food moves through the stomach and intestines (gastrointestinal tract). Things to avoid include:  High-fiber foods, such as dried fruit, raw fruits and vegetables, nuts, seeds, and whole  grain foods.  Spicy foods and high-fat foods.  Foods and beverages sweetened with high-fructose corn syrup, honey, or sugar alcohols such as xylitol, sorbitol, and mannitol. WHAT FOODS ARE RECOMMENDED? Grains White rice. White, [place], or pita breads (fresh or toasted), including plain rolls, buns, or bagels. White pasta. Saltine, soda, or graham crackers. Pretzels. Low-fiber cereal. Cooked cereals made with water (such as cornmeal, farina, or cream cereals). Plain muffins. Matzo. Melba toast. Zwieback.  Vegetables Potatoes (without the skin). Strained tomato and vegetable juices. Most well-cooked and canned vegetables without seeds. Tender lettuce. Fruits Cooked or canned applesauce, apricots, cherries, fruit cocktail, grapefruit, peaches, pears, or plums. Fresh bananas, apples without skin, cherries, grapes, cantaloupe, grapefruit, peaches, oranges, or plums.  Meat and Other Protein Products Baked or boiled chicken. Eggs. Tofu. Fish. Seafood. Smooth peanut butter. Ground or well-cooked tender beef, ham, veal, lamb, pork, or poultry.  Dairy Plain yogurt, kefir, and unsweetened liquid yogurt. Lactose-free milk, buttermilk, or soy milk. Plain hard cheese. Beverages Sport drinks. Clear broths. Diluted fruit juices (except prune). Regular, caffeine-free sodas such as ginger ale. Water. Decaffeinated teas. Oral rehydration solutions. Sugar-free beverages not sweetened with sugar alcohols. Other Bouillon, broth, or soups made from recommended foods.  The items listed above may not be a complete list of recommended foods or beverages. Contact your dietitian for more options. WHAT FOODS ARE NOT RECOMMENDED? Grains Whole grain, whole wheat, bran, or rye breads, rolls, pastas, crackers, and cereals. Wild or brown rice. Cereals that contain more than 2 g of fiber per serving.  Corn tortillas or taco shells. Cooked or dry oatmeal. Granola. Popcorn. Vegetables Raw vegetables. Cabbage, broccoli, Brussels  sprouts, artichokes, baked beans, beet greens, corn, kale, legumes, peas, sweet potatoes, and yams. Potato skins. Cooked spinach and cabbage. Fruits Dried fruit, including raisins and dates. Raw fruits. Stewed or dried prunes. Fresh apples with skin, apricots, mangoes, pears, raspberries, and strawberries.  Meat and Other Protein Products Chunky peanut butter. Nuts and seeds. Beans and lentils. Berniece Salines.  Dairy High-fat cheeses. Milk, chocolate milk, and beverages made with milk, such as milk shakes. Cream. Ice cream. Sweets and Desserts Sweet rolls, doughnuts, and sweet breads. Pancakes and waffles. Fats and Oils Butter. Cream sauces. Margarine. Salad oils. Plain salad dressings. Olives. Avocados.  Beverages Caffeinated beverages (such as coffee, tea, soda, or energy drinks). Alcoholic beverages. Fruit juices with pulp. Prune juice. Soft drinks sweetened with high-fructose corn syrup or sugar alcohols. Other Coconut. Hot sauce. Chili powder. Mayonnaise. Gravy. Cream-based or milk-based soups.  The items listed above may not be a complete list of foods and beverages to avoid. Contact your dietitian for more information. WHAT SHOULD I DO IF I BECOME DEHYDRATED? Diarrhea can sometimes lead to dehydration. Signs of dehydration include dark urine and dry mouth and skin. If you think you are dehydrated, you should rehydrate with an oral rehydration solution. These solutions can be purchased at pharmacies, retail stores, or online.  Drink -1 cup (120-240 mL) of oral rehydration solution each time you have an episode of diarrhea. If drinking this amount makes your diarrhea worse, try drinking smaller amounts more often. For example, drink 1-3 tsp (5-15 mL) every 5-10 minutes.  A general rule for staying hydrated is to drink 1-2 L of fluid per day. Talk to your health care provider about the specific amount you should be drinking each day. Drink enough fluids to keep your urine clear or pale  yellow. Document Released: 07/29/2003 Document Revised: 05/13/2013 Document Reviewed: 03/31/2013 [facility] Patient Information 2015 [place], [place]. This information is not intended to replace advice given to you by your health care provider. Make sure you discuss any questions you have with your health care provider.  Insomnia Insomnia is frequent trouble falling and/or staying asleep. Insomnia can be a long term problem or a short term problem. Both are common. Insomnia can be a short term problem when the wakefulness is related to a certain stress or worry. Long term insomnia is often related to ongoing stress during waking hours and/or poor sleeping habits. Overtime, sleep deprivation itself can make the problem worse. Every little thing feels more severe because you are overtired and your ability to cope is decreased. CAUSES   Stress, anxiety, and depression.  Poor sleeping habits.  Distractions such as TV in the bedroom.  Naps close to bedtime.  Engaging in emotionally charged conversations before bed.  Technical reading before sleep.  Alcohol and other sedatives. They may make the problem worse. They can hurt normal sleep patterns and normal dream activity.  Stimulants such as caffeine for several hours prior to bedtime.  Pain syndromes and shortness of breath can cause insomnia.  Exercise late at night.  Changing time zones may cause sleeping problems (jet lag). It is sometimes helpful to have someone observe your sleeping patterns. They should look for periods of not breathing during the night (sleep apnea). They should also look to see how long those periods last. If you live alone or observers are uncertain, you can also be observed at a sleep clinic where your  sleep patterns will be professionally monitored. Sleep apnea requires a checkup and treatment. Give your caregivers your medical history. Give your caregivers observations your family has made about your sleep.   SYMPTOMS   Not feeling rested in the morning.  Anxiety and restlessness at bedtime.  Difficulty falling and staying asleep. TREATMENT   Your caregiver may prescribe treatment for an underlying medical disorders. Your caregiver can give advice or help if you are using alcohol or other drugs for self-medication. Treatment of underlying problems will usually eliminate insomnia problems.  Medications can be prescribed for short time use. They are generally not recommended for lengthy use.  Over-the-counter sleep medicines are not recommended for lengthy use. They can be habit forming.  You can promote easier sleeping by making lifestyle changes such as:  Using relaxation techniques that help with breathing and reduce muscle tension.  Exercising earlier in the day.  Changing your diet and the time of your last meal. No night time snacks.  Establish a regular time to go to bed.  Counseling can help with stressful problems and worry.  Soothing music and white noise may be helpful if there are background noises you cannot remove.  Stop tedious detailed work at least one hour before bedtime. HOME CARE INSTRUCTIONS   Keep a diary. Inform your caregiver about your progress. This includes any medication side effects. See your caregiver regularly. Take note of:  Times when you are asleep.  Times when you are awake during the night.  The quality of your sleep.  How you feel the next day. This information will help your caregiver care for you.  Get out of bed if you are still awake after 15 minutes. Read or do some quiet activity. Keep the lights down. Wait until you feel sleepy and go back to bed.  Keep regular sleeping and waking hours. Avoid naps.  Exercise regularly.  Avoid distractions at bedtime. Distractions include watching television or engaging in any intense or detailed activity like attempting to balance the household checkbook.  Develop a bedtime ritual. Keep a  familiar routine of bathing, brushing your teeth, climbing into bed at the same time each night, listening to soothing music. Routines increase the success of falling to sleep faster.  Use relaxation techniques. This can be using breathing and muscle tension release routines. It can also include visualizing peaceful scenes. You can also help control troubling or intruding thoughts by keeping your mind occupied with boring or repetitive thoughts like the old concept of counting sheep. You can make it more creative like imagining planting one beautiful flower after another in your backyard garden.  During your day, work to eliminate stress. When this is not possible use some of the previous suggestions to help reduce the anxiety that accompanies stressful situations. MAKE SURE YOU:   Understand these instructions.  Will watch your condition.  Will get help right away if you are not doing well or get worse. Document Released: 05/05/2000 Document Revised: 07/31/2011 Document Reviewed: 06/05/2007 [facility] Patient Information 2015 [place], [place]. This information is not intended to replace advice given to you by your health care provider. Make sure you discuss any questions you have with your health care provider.

## 2014-10-07 NOTE — ED Notes (Signed)
Pt reported history of bowel related problems x 7 months along with inability to sleep for the past 7 months. Pt reports seeing a GI MD but is unable to report last time being seen and verbalized "I have never actually seen the doctor, I am only seen by the nurse when I go into the stomach doctor" Pt was asked a second time when the last visit to the GI MD was and pt began beating his fists into the sides of his head repeatedly. Pt stats he now has bumps on his head and a headache. Pt reports not being able to sleep has caused his brain to be hazy and he can not think straight.

## 2014-10-07 NOTE — ED Provider Notes (Signed)
[facility] Emergency Department Provider Note  ____________________________________________  Time seen: Approximately 9:37 PM  I have reviewed the triage vital signs and the nursing notes.   HISTORY  Chief Complaint Diarrhea    HPI Jeremiah [name] is a 49 y.o. male with an extensive psychiatric history that causes him to perseverate about possible gastrointestinal ailments from which he believes he suffers.  He complains today of persistent bowel problems, primarily diarrhea, though he reports having constipation the past.  He also states that he has been unable to sleep for 7 months.  He reports that he stopped taking his Seroquel, Vistaril, and Ambien because they "were not working ".  He does not endorse any abdominal pain, just persistent diarrhea.   Past Medical History  Diagnosis Date  . Hypertension   . Rectal fistula   . Prostatitis   . Diverticulosis   . Gastroparesis   . Bipolar disorder   . Borderline personality disorder   . Major depression     Patient Active Problem List   Diagnosis Date Noted  . Anxiety disorder 09/12/2014    Past Surgical History  Procedure Laterality Date  . Lumbar spine surgery    . Colonoscopy      Current Outpatient Rx  Name  Route  Sig  Dispense  Refill  . amLODipine (NORVASC) 5 MG tablet   Oral   Take 1 tablet (5 mg total) by mouth daily.         . calcium carbonate (TUMS EX) 750 MG chewable tablet   Oral   Chew 1 tablet by mouth daily as needed for heartburn (heartburn).         . cloNIDine (CATAPRES) 0.2 MG tablet   Oral   Take 1 tablet (0.2 mg total) by mouth 2 (two) times daily.         . divalproex (DEPAKOTE) 500 MG DR tablet   Oral   Take 1 tablet (500 mg total) by mouth at bedtime.         . docusate sodium (COLACE) 100 MG capsule   Oral   Take 100 mg by mouth daily as needed for mild constipation (constipation).          . fluconazole (DIFLUCAN) 100 MG tablet   Oral   Take  1 tablet (100 mg total) by mouth daily.   13 tablet   0   . hydrOXYzine (VISTARIL) 25 MG capsule   Oral   Take 1 capsule (25 mg total) by mouth 3 (three) times daily as needed for anxiety.   45 capsule   0   . metoprolol succinate (TOPROL-XL) 100 MG 24 hr tablet   Oral   Take 1 tablet (100 mg total) by mouth daily. Take with or immediately following a meal.         . mirtazapine (REMERON) 30 MG tablet   Oral   Take 1 tablet (30 mg total) by mouth at bedtime.         Marland Kitchen QUEtiapine (SEROQUEL) 300 MG tablet   Oral   Take 1 tablet (300 mg total) by mouth at bedtime.   30 tablet   0   . zolpidem (AMBIEN CR) 12.5 MG CR tablet   Oral   Take 1 tablet (12.5 mg total) by mouth at bedtime as needed for sleep.   30 tablet   1     Allergies Ciprofloxacin; Milk-related compounds; Pepto-bismol; and Trazodone and nefazodone  No family history on file.  Social History History  Substance Use Topics  . Smoking status: Current Every Day Smoker  . Smokeless tobacco: Not on file  . Alcohol Use: Yes     Comment: every day or every other day (2-4  12oz cans)    Review of Systems Constitutional: No fever/chills.  Insomnia Eyes: No visual changes. ENT: No sore throat.  "I am supposed to have a biopsy my tongue" Cardiovascular: Denies chest pain. Respiratory: Denies shortness of breath. Gastrointestinal: No abdominal pain.  No nausea, no vomiting.  Persistent diarrhea.  No constipation. Genitourinary: Negative for dysuria. Musculoskeletal: Negative for back pain. Skin: Negative for rash. Neurological: Negative for headaches, focal weakness or numbness. Psychiatric:"I cannot sleep" 10-point ROS otherwise negative.  ____________________________________________   PHYSICAL EXAM:  VITAL SIGNS: ED Triage Vitals  Enc Vitals Group     BP 10/07/14 1834 131/93 mmHg     Pulse Rate 10/07/14 1834 57     Resp 10/07/14 1834 20     Temp 10/07/14 1834 97.9 F (36.6 C)     Temp Source  10/07/14 1834 Oral     SpO2 10/07/14 1834 96 %     Weight 10/07/14 1834 175 lb (79.379 kg)     Height 10/07/14 1834 5\' 7"  (1.702 m)     Head Cir --      Peak Flow --      Pain Score 10/07/14 1835 8     Pain Loc --      Pain Edu? --      Excl. in [place]? --     Constitutional: Odd affect, somewhat pressured speech .Alert and oriented.  Somewhat disheveled, anxious but in no acute distress Eyes: Conjunctivae are normal. PERRL. EOMI. Head: Atraumatic. Nose: No congestion/rhinnorhea. Mouth/Throat: Mucous membranes are moist.  Oropharynx non-erythematous.  White film on tongue that scrapes off easily Neck: No stridor.   Cardiovascular: Normal rate, regular rhythm. Grossly normal heart sounds.  Good peripheral circulation. Respiratory: Normal respiratory effort.  No retractions. Lungs CTAB. Gastrointestinal: Soft and nontender. No distention. No abdominal bruits. No CVA tenderness. Musculoskeletal: No lower extremity tenderness nor edema.  No joint effusions. Neurologic:  No gross focal neurologic deficits are appreciated.  Not slurred Speech. No gait instability. Skin:  Skin is warm, dry and intact. No rash noted. Psychiatric: Odd affect, somewhat pressured speech, perseverates about his bowel issues, reports that he is noncompliant with his medications.  Evasive when asked about his psychiatric follow-up appointments.  ____________________________________________   LABS (all labs ordered are listed, but only abnormal results are displayed)  Labs Reviewed  COMPREHENSIVE METABOLIC PANEL - Abnormal; Notable for the following:    Glucose, Bld 102 (*)    Total Protein 6.1 (*)    All other components within normal limits  URINALYSIS COMPLETEWITH MICROSCOPIC ([facility])  - Abnormal; Notable for the following:    Color, Urine YELLOW (*)    APPearance CLEAR (*)    Hgb urine dipstick 1+ (*)    pH 9.0 (*)    All other components within normal limits  CBC WITH DIFFERENTIAL/PLATELET  LIPASE,  BLOOD   ____________________________________________  EKG  Not indicated ____________________________________________  RADIOLOGY  Not indicated  ____________________________________________   PROCEDURES  Procedure(s) performed: None  Critical Care performed: No  ____________________________________________   INITIAL IMPRESSION / ASSESSMENT AND PLAN / ED COURSE  Pertinent labs & imaging results that were available during my care of the patient were reviewed by me and considered in my medical decision making (see chart for details).  The patient has an extensive psych history with numerous visits to this emergency department, outpatient to [facility], and multiple facilities within the [facility].  I reviewed, for example a note from 09/11/2014 when he was For psychiatric observation.  Similar to prior visits at [facility], he has been found to not meet criteria for inpatient admission.  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  He has had numerous medical workups who has been unable to identify a specific gastrointestinal ailment.  Similarly today, the patient has a normal metabolic panel and a completely nontender abdominal exam.  I recommended that the patient follow-up with his outpatient doctor as well as [facility] and at [facility] for psychiatric care.  Since he stated that he is out of his psychiatric medicine and has not been taking them, and that his main complaint by the end of my evaluation was his insomnia, I am re-prescribing his Seroquel, Vistaril, and Ambien.  I am giving him a dose of Seroquel and Ambien in the emergency department as it seems appropriate given his insomnia and somewhat manic affect.  He denies SI/HI and does not meet criteria for psychiatric admission or further evaluation at this time.  I gave him my usual and customary return precautions. ____________________________________________   FINAL CLINICAL IMPRESSION(S) /  ED DIAGNOSES  Final diagnoses:  Nausea and vomiting, vomiting of unspecified type  Insomnia  Mania     Hinda Kehr, MD 10/07/14 2222

## 2014-10-07 NOTE — ED Notes (Signed)
Pharmacy called about Seroquel XR. Awaiting medication to be sent to ED.

## 2014-10-12 ENCOUNTER — Ambulatory Visit
Admission: RE | Admit: 2014-10-12 | Discharge: 2014-10-12 | Disposition: A | Discharge status: Home / Self Care | Payer: Self-pay | Source: Ambulatory Visit | Attending: Gastroenterology | Admitting: Gastroenterology

## 2014-10-12 ENCOUNTER — Encounter: Payer: Self-pay | Admitting: Anesthesiology

## 2014-10-12 ENCOUNTER — Ambulatory Visit: Payer: Self-pay | Admitting: Anesthesiology

## 2014-10-12 ENCOUNTER — Encounter
Admission: RE | Disposition: A | Discharge status: Home / Self Care | Payer: Self-pay | Source: Ambulatory Visit | Attending: Gastroenterology

## 2014-10-12 DIAGNOSIS — Z09 Encounter for follow-up examination after completed treatment for conditions other than malignant neoplasm: Secondary | ICD-10-CM | POA: Insufficient documentation

## 2014-10-12 DIAGNOSIS — F17200 Nicotine dependence, unspecified, uncomplicated: Secondary | ICD-10-CM | POA: Insufficient documentation

## 2014-10-12 DIAGNOSIS — N419 Inflammatory disease of prostate, unspecified: Secondary | ICD-10-CM | POA: Insufficient documentation

## 2014-10-12 DIAGNOSIS — F319 Bipolar disorder, unspecified: Secondary | ICD-10-CM | POA: Insufficient documentation

## 2014-10-12 DIAGNOSIS — F603 Borderline personality disorder: Secondary | ICD-10-CM | POA: Insufficient documentation

## 2014-10-12 DIAGNOSIS — Z888 Allergy status to other drugs, medicaments and biological substances status: Secondary | ICD-10-CM | POA: Insufficient documentation

## 2014-10-12 DIAGNOSIS — Z79899 Other long term (current) drug therapy: Secondary | ICD-10-CM | POA: Insufficient documentation

## 2014-10-12 DIAGNOSIS — K5790 Diverticulosis of intestine, part unspecified, without perforation or abscess without bleeding: Secondary | ICD-10-CM | POA: Insufficient documentation

## 2014-10-12 DIAGNOSIS — R634 Abnormal weight loss: Secondary | ICD-10-CM | POA: Insufficient documentation

## 2014-10-12 DIAGNOSIS — K3184 Gastroparesis: Secondary | ICD-10-CM | POA: Insufficient documentation

## 2014-10-12 DIAGNOSIS — Z882 Allergy status to sulfonamides status: Secondary | ICD-10-CM | POA: Insufficient documentation

## 2014-10-12 DIAGNOSIS — Z86010 Personal history of colonic polyps: Secondary | ICD-10-CM | POA: Insufficient documentation

## 2014-10-12 DIAGNOSIS — R933 Abnormal findings on diagnostic imaging of other parts of digestive tract: Secondary | ICD-10-CM | POA: Insufficient documentation

## 2014-10-12 DIAGNOSIS — K5900 Constipation, unspecified: Secondary | ICD-10-CM | POA: Insufficient documentation

## 2014-10-12 DIAGNOSIS — I10 Essential (primary) hypertension: Secondary | ICD-10-CM | POA: Insufficient documentation

## 2014-10-12 HISTORY — PX: COLONOSCOPY WITH PROPOFOL: SHX5780

## 2014-10-12 SURGERY — COLONOSCOPY WITH PROPOFOL
Anesthesia: General

## 2014-10-12 MED ORDER — PROPOFOL INFUSION 10 MG/ML OPTIME
INTRAVENOUS | Status: DC | PRN
  Administered 2014-10-12: 100 ug/kg/min via INTRAVENOUS

## 2014-10-12 MED ORDER — LACTATED RINGERS IV SOLN
INTRAVENOUS | Status: DC | PRN
  Administered 2014-10-12: 11:00:00 via INTRAVENOUS

## 2014-10-12 MED ORDER — SODIUM CHLORIDE 0.9 % IV SOLN
INTRAVENOUS | Status: DC
  Administered 2014-10-12: 10:00:00 via INTRAVENOUS

## 2014-10-12 MED ORDER — PROPOFOL 10 MG/ML IV BOLUS
INTRAVENOUS | Status: DC | PRN
  Administered 2014-10-12: 100 mg via INTRAVENOUS
  Administered 2014-10-12 (×2): 50 mg via INTRAVENOUS

## 2014-10-12 NOTE — Anesthesia Preprocedure Evaluation (Addendum)
Anesthesia Evaluation  Patient identified by MRN, date of birth, ID band Patient awake    Reviewed: Allergy & Precautions, H&P , NPO status , Patient's Chart, lab work & pertinent test results  Airway Mallampati: II  TM Distance: >3 FB     Dental  (+) Chipped, Poor Dentition   Pulmonary Current Smoker,          Cardiovascular hypertension,     Neuro/Psych PSYCHIATRIC DISORDERS    GI/Hepatic   Endo/Other    Renal/GU      Musculoskeletal   Abdominal   Peds  Hematology   Anesthesia Other Findings Bipolarr. Several broken teeth. Several missing.  Reproductive/Obstetrics                            Anesthesia Physical Anesthesia Plan  ASA: II  Anesthesia Plan: General   Post-op Pain Management:    Induction: Intravenous  Airway Management Planned: Nasal Cannula  Additional Equipment:   Intra-op Plan:   Post-operative Plan:   Informed Consent: I have reviewed the patients History and Physical, chart, labs and discussed the procedure including the risks, benefits and alternatives for the proposed anesthesia with the patient or authorized representative who has indicated his/her understanding and acceptance.     Plan Discussed with: CRNA  Anesthesia Plan Comments:         Anesthesia Quick Evaluation

## 2014-10-12 NOTE — H&P (Signed)
Primary Care Physician:  Christianne Borrow, MD Primary Gastroenterologist:  Dr. Candace Cruise  Pre-Procedure History & Physical: HPI:  Jeremiah [name] is a 49 y.o. male is here for colonoscopy.   Past Medical History  Diagnosis Date  . Hypertension   . Rectal fistula   . Prostatitis   . Diverticulosis   . Gastroparesis   . Bipolar disorder   . Borderline personality disorder   . Major depression     Past Surgical History  Procedure Laterality Date  . Lumbar spine surgery    . Colonoscopy      Prior to Admission medications   Medication Sig Start Date End Date Taking? Authorizing Provider  ALPRAZolam Duanne Moron) 1 MG tablet Take 1 mg by mouth 3 (three) times daily as needed for anxiety.   Yes Historical Provider, MD  amLODipine (NORVASC) 5 MG tablet Take 1 tablet (5 mg total) by mouth daily. 09/12/14  Yes Benjamine Mola, FNP  bisacodyl (DULCOLAX) 5 MG EC tablet Take 5 mg by mouth daily as needed for mild constipation or moderate constipation.   Yes Historical Provider, MD  calcium carbonate (TUMS EX) 750 MG chewable tablet Chew 1 tablet by mouth daily as needed for heartburn (heartburn).   Yes Historical Provider, MD  cloNIDine (CATAPRES) 0.2 MG tablet Take 1 tablet (0.2 mg total) by mouth 2 (two) times daily. 09/12/14  Yes Benjamine Mola, FNP  divalproex (DEPAKOTE) 500 MG DR tablet Take 1 tablet (500 mg total) by mouth at bedtime. 09/12/14  Yes Benjamine Mola, FNP  fluconazole (DIFLUCAN) 100 MG tablet Take 1 tablet (100 mg total) by mouth daily. 09/13/14  Yes Benjamine Mola, FNP  hydrOXYzine (VISTARIL) 25 MG capsule Take 1 capsule (25 mg total) by mouth 3 (three) times daily as needed for anxiety. 10/07/14  Yes Hinda Kehr, MD  metoprolol succinate (TOPROL-XL) 100 MG 24 hr tablet Take 1 tablet (100 mg total) by mouth daily. Take with or immediately following a meal. 09/12/14  Yes Benjamine Mola, FNP  mirtazapine (REMERON) 30 MG tablet Take 1 tablet (30 mg total) by mouth at bedtime. 09/12/14  Yes  Benjamine Mola, FNP  Multiple Vitamin (MULTIVITAMIN WITH MINERALS) TABS tablet Take 1 tablet by mouth daily.   Yes Historical Provider, MD  polyethylene glycol (MIRALAX / GLYCOLAX) packet Take 17 g by mouth 2 (two) times daily as needed for mild constipation.   Yes Historical Provider, MD  polyethylene glycol-electrolytes (NULYTELY/GOLYTELY) 420 G solution Take as directed per colonoscopy prep 10/06/14  Yes Historical Provider, MD  QUEtiapine (SEROQUEL) 300 MG tablet Take 1 tablet (300 mg total) by mouth at bedtime. 10/07/14  Yes Hinda Kehr, MD  zolpidem (AMBIEN) 10 MG tablet Take 10 mg by mouth at bedtime as needed for sleep.   Yes Historical Provider, MD  zolpidem (AMBIEN CR) 12.5 MG CR tablet Take 1 tablet (12.5 mg total) by mouth at bedtime as needed for sleep. 10/07/14 10/07/15  Hinda Kehr, MD    Allergies as of 10/09/2014 - Review Complete 10/09/2014  Allergen Reaction Noted  . Ciprofloxacin Other (See Comments) 09/11/2014  . Milk-related compounds Other (See Comments) 07/07/2013  . Pepto-bismol [bismuth subsalicylate] Other (See Comments) 09/11/2014  . Trazodone and nefazodone Other (See Comments) 09/11/2014  . Sulfur Other (See Comments) 10/09/2014    History reviewed. No pertinent family history.  History   Social History  . Marital Status: Single    Spouse Name: N/A  . Number of Children: N/A  .  Years of Education: N/A   Occupational History  . Not on file.   Social History Main Topics  . Smoking status: Light Tobacco Smoker  . Smokeless tobacco: Not on file  . Alcohol Use: No     Comment: every day or every other day (2-4  12oz cans)  . Drug Use: No  . Sexual Activity: Not on file   Other Topics Concern  . Not on file   Social History Narrative    Review of Systems: See HPI, otherwise negative ROS  Physical Exam: BP 134/94 mmHg  Pulse 64  Temp(Src) 95.9 F (35.5 C) (Tympanic)  Resp 20  Ht 5\' 7"  (1.702 m)  Wt 81.647 kg (180 lb)  BMI 28.19 kg/m2  SpO2  100% General:   Alert,  pleasant and cooperative in NAD Head:  Normocephalic and atraumatic. Neck:  Supple; no masses or thyromegaly. Lungs:  Clear throughout to auscultation.    Heart:  Regular rate and rhythm. Abdomen:  Soft, nontender and nondistended. Normal bowel sounds, without guarding, and without rebound.   Neurologic:  Alert and  oriented x4;  grossly normal neurologically.  Impression/Plan: DEERIC CRUISE is here for colonoscopy to be performed for personal hx of colon polyps, abnormal CT, chronic constipation, and weight loss. Risks, benefits, limitations, and alternatives regarding colonoscopy have been reviewed with the patient.  Questions have been answered.  All parties agreeable.   Adelaide Pfefferkorn, Lupita Dawn, MD  10/12/2014, 10:15 AM

## 2014-10-12 NOTE — Op Note (Signed)
[facility] Gastroenterology Patient Name: Jeremiah [name] Procedure Date: 10/12/2014 10:28 AM MRN: [id number] Account #: [id number] Date of Birth: 01/08/1966 Admit Type: Outpatient Age: 49 Room: [facility] ENDO ROOM 4 Gender: Male Note Status: Finalized Procedure:         Colonoscopy Indications:       Personal history of colonic polyps, Abnormal CT of the GI                     tract, Constipation Providers:         Lupita Dawn. Candace Cruise, MD Referring MD:      Bethena Roys. Sowles, MD (Referring MD) Medicines:         Monitored Anesthesia Care Complications:     No immediate complications. Procedure:         Pre-Anesthesia Assessment:                    - Prior to the procedure, a History and Physical was                     performed, and patient medications, allergies and                     sensitivities were reviewed. The patient's tolerance of                     previous anesthesia was reviewed.                    - The risks and benefits of the procedure and the sedation                     options and risks were discussed with the patient. All                     questions were answered and informed consent was obtained.                    - After reviewing the risks and benefits, the patient was                     deemed in satisfactory condition to undergo the procedure.                    After obtaining informed consent, the colonoscope was                     passed under direct vision. Throughout the procedure, the                     patient's blood pressure, pulse, and oxygen saturations                     were monitored continuously. The Colonoscope was                     introduced through the anus and advanced to the the cecum,                     identified by appendiceal orifice and ileocecal valve. The                     colonoscopy was performed without difficulty. The patient  tolerated the procedure well. The quality of the bowel                preparation was good. Findings:      The colon (entire examined portion) appeared normal. Impression:        - The entire examined colon is normal.                    - No specimens collected. Recommendation:    - Discharge patient to home.                    - High fiber diet daily.                    - The findings and recommendations were discussed with the                     patient. Procedure Code(s): --- Professional ---                    [phone], Colonoscopy, flexible; diagnostic, including                     collection of specimen(s) by brushing or washing, when                     performed (separate procedure) Diagnosis Code(s): --- Professional ---                    Z86.010, Personal history of colonic polyps                    K59.00, Constipation, unspecified                    R93.3, Abnormal findings on diagnostic imaging of other                     parts of digestive tract CPT copyright 2014 [employer]. All rights reserved. The codes documented in this report are preliminary and upon coder review may  be revised to meet current compliance requirements. Hulen Luster, MD 10/12/2014 10:57:24 AM This report has been signed electronically. Number of Addenda: 0 Note Initiated On: 10/12/2014 10:28 AM Scope Withdrawal Time: 0 hours 6 minutes 53 seconds  Total Procedure Duration: 0 hours 11 minutes 25 seconds       [facility]

## 2014-10-12 NOTE — Transfer of Care (Signed)
Immediate Anesthesia Transfer of Care Note  [name]  Procedure(s) Performed: Procedure(s): COLONOSCOPY WITH PROPOFOL (N/A)  Patient Location: PACU  Anesthesia Type:General  Level of Consciousness: awake  Airway & Oxygen Therapy: Patient Spontanous Breathing  Post-op Assessment: Report given to RN  Post vital signs: stable  Last Vitals:  Filed Vitals:   10/12/14 1001  BP: 134/94  Pulse: 64  Temp: 35.5 C  Resp: 20    Complications: No apparent anesthesia complications

## 2014-10-12 NOTE — Anesthesia Postprocedure Evaluation (Signed)
  Anesthesia Post-op Note  [name]  Procedure(s) Performed: Procedure(s): COLONOSCOPY WITH PROPOFOL (N/A)  Anesthesia type:General  Patient location: PACU  Post pain: Pain level controlled  Post assessment: Post-op Vital signs reviewed, Patient's Cardiovascular Status Stable, Respiratory Function Stable, Patent Airway and No signs of Nausea or vomiting  Post vital signs: Reviewed and stable  Last Vitals:  Filed Vitals:   10/12/14 1130  BP: 126/88  Pulse: 58  Temp:   Resp: 19    Level of consciousness: awake, alert  and patient cooperative  Complications: No apparent anesthesia complications

## 2014-10-13 ENCOUNTER — Encounter: Payer: Self-pay | Admitting: Gastroenterology

## 2014-10-15 ENCOUNTER — Emergency Department (HOSPITAL_COMMUNITY)
Admission: EM | Admit: 2014-10-15 | Discharge: 2014-10-17 | Disposition: A | Discharge status: Other Acute Inpatient Hospital | Payer: No Typology Code available for payment source | Attending: Emergency Medicine | Admitting: Emergency Medicine

## 2014-10-15 ENCOUNTER — Encounter (HOSPITAL_COMMUNITY): Payer: Self-pay | Admitting: Emergency Medicine

## 2014-10-15 ENCOUNTER — Emergency Department (HOSPITAL_COMMUNITY): Payer: Self-pay

## 2014-10-15 DIAGNOSIS — Z720 Tobacco use: Secondary | ICD-10-CM | POA: Insufficient documentation

## 2014-10-15 DIAGNOSIS — I10 Essential (primary) hypertension: Secondary | ICD-10-CM | POA: Insufficient documentation

## 2014-10-15 DIAGNOSIS — F319 Bipolar disorder, unspecified: Secondary | ICD-10-CM | POA: Insufficient documentation

## 2014-10-15 DIAGNOSIS — R059 Cough, unspecified: Secondary | ICD-10-CM

## 2014-10-15 DIAGNOSIS — G4700 Insomnia, unspecified: Secondary | ICD-10-CM | POA: Insufficient documentation

## 2014-10-15 DIAGNOSIS — Z792 Long term (current) use of antibiotics: Secondary | ICD-10-CM | POA: Insufficient documentation

## 2014-10-15 DIAGNOSIS — R45851 Suicidal ideations: Secondary | ICD-10-CM

## 2014-10-15 DIAGNOSIS — Z79899 Other long term (current) drug therapy: Secondary | ICD-10-CM | POA: Insufficient documentation

## 2014-10-15 DIAGNOSIS — R05 Cough: Secondary | ICD-10-CM | POA: Insufficient documentation

## 2014-10-15 DIAGNOSIS — F1310 Sedative, hypnotic or anxiolytic abuse, uncomplicated: Secondary | ICD-10-CM | POA: Insufficient documentation

## 2014-10-15 DIAGNOSIS — Z8719 Personal history of other diseases of the digestive system: Secondary | ICD-10-CM | POA: Insufficient documentation

## 2014-10-15 DIAGNOSIS — F39 Unspecified mood [affective] disorder: Secondary | ICD-10-CM | POA: Diagnosis present

## 2014-10-15 LAB — CBC
HCT: 39 % (ref 39.0–52.0)
Hemoglobin: 13.4 g/dL (ref 13.0–17.0)
MCH: 29.5 pg (ref 26.0–34.0)
MCHC: 34.4 g/dL (ref 30.0–36.0)
MCV: 85.7 fL (ref 78.0–100.0)
Platelets: 359 10*3/uL (ref 150–400)
RBC: 4.55 MIL/uL (ref 4.22–5.81)
RDW: 12.6 % (ref 11.5–15.5)
WBC: 5.6 10*3/uL (ref 4.0–10.5)

## 2014-10-15 LAB — RAPID URINE DRUG SCREEN, HOSP PERFORMED
Amphetamines: NOT DETECTED
Barbiturates: NOT DETECTED
Benzodiazepines: POSITIVE — AB
Cocaine: NOT DETECTED
Opiates: NOT DETECTED
Tetrahydrocannabinol: NOT DETECTED

## 2014-10-15 LAB — URINALYSIS, ROUTINE W REFLEX MICROSCOPIC
BILIRUBIN URINE: NEGATIVE
Glucose, UA: NEGATIVE mg/dL
HGB URINE DIPSTICK: NEGATIVE
Ketones, ur: NEGATIVE mg/dL
Leukocytes, UA: NEGATIVE
Nitrite: NEGATIVE
PROTEIN: NEGATIVE mg/dL
Specific Gravity, Urine: 1.009 (ref 1.005–1.030)
Urobilinogen, UA: 0.2 mg/dL (ref 0.0–1.0)
pH: 6.5 (ref 5.0–8.0)

## 2014-10-15 LAB — COMPREHENSIVE METABOLIC PANEL
ALK PHOS: 53 U/L (ref 38–126)
ALT: 22 U/L (ref 17–63)
AST: 21 U/L (ref 15–41)
Albumin: 4.2 g/dL (ref 3.5–5.0)
Anion gap: 9 (ref 5–15)
BUN: 6 mg/dL (ref 6–20)
CO2: 26 mmol/L (ref 22–32)
Calcium: 9.5 mg/dL (ref 8.9–10.3)
Chloride: 103 mmol/L (ref 101–111)
Creatinine, Ser: 0.92 mg/dL (ref 0.61–1.24)
GFR calc non Af Amer: >60 mL/min (ref >60)
GLUCOSE: 115 mg/dL — AB (ref 65–99)
POTASSIUM: 4.6 mmol/L (ref 3.5–5.1)
SODIUM: 138 mmol/L (ref 135–145)
Total Bilirubin: 1 mg/dL (ref 0.3–1.2)
Total Protein: 6.9 g/dL (ref 6.5–8.1)

## 2014-10-15 LAB — ETHANOL

## 2014-10-15 LAB — SALICYLATE LEVEL: Salicylate Lvl: <4 mg/dL (ref 2.8–30.0)

## 2014-10-15 LAB — ACETAMINOPHEN LEVEL

## 2014-10-15 MED ORDER — ZOLPIDEM TARTRATE 5 MG PO TABS
5.0000 mg | ORAL_TABLET | Freq: Every evening | ORAL | Status: DC | PRN
  Administered 2014-10-15: 5 mg via ORAL
  Filled 2014-10-15: qty 1

## 2014-10-15 MED ORDER — ALUM & MAG HYDROXIDE-SIMETH 200-200-20 MG/5ML PO SUSP: 30.0000 mL | ORAL | Status: DC | PRN

## 2014-10-15 MED ORDER — LORAZEPAM 1 MG PO TABS
1.0000 mg | ORAL_TABLET | Freq: Three times a day (TID) | ORAL | Status: DC | PRN
  Administered 2014-10-16: 1 mg via ORAL
  Filled 2014-10-15: qty 1

## 2014-10-15 MED ORDER — AMLODIPINE BESYLATE 5 MG PO TABS
5.0000 mg | ORAL_TABLET | Freq: Every day | ORAL | Status: DC
  Administered 2014-10-16 – 2014-10-17 (×2): 5 mg via ORAL
  Filled 2014-10-15 (×2): qty 1

## 2014-10-15 MED ORDER — HYDROXYZINE HCL 25 MG PO TABS
25.0000 mg | ORAL_TABLET | Freq: Three times a day (TID) | ORAL | Status: DC | PRN
Start: 2014-10-15 — End: 2014-10-17

## 2014-10-15 MED ORDER — NICOTINE 21 MG/24HR TD PT24
21.0000 mg | MEDICATED_PATCH | Freq: Every day | TRANSDERMAL | Status: DC
  Administered 2014-10-15 – 2014-10-17 (×3): 21 mg via TRANSDERMAL
  Filled 2014-10-15 (×3): qty 1

## 2014-10-15 MED ORDER — BISACODYL 5 MG PO TBEC
5.0000 mg | DELAYED_RELEASE_TABLET | Freq: Every day | ORAL | Status: DC | PRN
Start: 2014-10-15 — End: 2014-10-17
  Administered 2014-10-16: 5 mg via ORAL
  Filled 2014-10-15: qty 1

## 2014-10-15 MED ORDER — HYDROXYZINE PAMOATE 25 MG PO CAPS: 25.0000 mg | ORAL_CAPSULE | Freq: Three times a day (TID) | ORAL | Status: DC | PRN

## 2014-10-15 MED ORDER — SODIUM CHLORIDE 0.9 % IV BOLUS (SEPSIS): 1000.0000 mL | Freq: Once | INTRAVENOUS | Status: DC

## 2014-10-15 MED ORDER — AMOXICILLIN 500 MG PO CAPS
500.0000 mg | ORAL_CAPSULE | Freq: Two times a day (BID) | ORAL | Status: DC
  Administered 2014-10-15 – 2014-10-17 (×4): 500 mg via ORAL
  Filled 2014-10-15 (×4): qty 1

## 2014-10-15 MED ORDER — VITAMIN B-1 100 MG PO TABS
100.0000 mg | ORAL_TABLET | Freq: Every day | ORAL | Status: DC
  Administered 2014-10-16 – 2014-10-17 (×2): 100 mg via ORAL
  Filled 2014-10-15 (×2): qty 1

## 2014-10-15 MED ORDER — ONDANSETRON HCL 4 MG PO TABS: 4.0000 mg | ORAL_TABLET | Freq: Three times a day (TID) | ORAL | Status: DC | PRN

## 2014-10-15 MED ORDER — QUETIAPINE FUMARATE 300 MG PO TABS
300.0000 mg | ORAL_TABLET | Freq: Every day | ORAL | Status: DC
  Administered 2014-10-15 – 2014-10-16 (×2): 300 mg via ORAL
  Filled 2014-10-15 (×2): qty 1

## 2014-10-15 MED ORDER — SODIUM CHLORIDE 0.9 % IV BOLUS (SEPSIS)
1000.0000 mL | Freq: Once | INTRAVENOUS | Status: AC
  Administered 2014-10-15: 1000 mL via INTRAVENOUS

## 2014-10-15 MED ORDER — ACETAMINOPHEN 325 MG PO TABS
650.0000 mg | ORAL_TABLET | ORAL | Status: DC | PRN
Start: 2014-10-15 — End: 2014-10-17

## 2014-10-15 NOTE — ED Notes (Signed)
TTS being completed with Pt.

## 2014-10-15 NOTE — Progress Notes (Addendum)
Pt's referral was faxed to: [facility] - per Caryl Pina, fax it.  [facility] - per [place], fax referral at [phone].  [facility] - per Gerald Stabs, referral received and will be reviewed by am staff. No beds tonight but d/cs tomorrow. [place] - per Chrys Racer, fax referral for the waitlist.  [facility] - per Fraser Din, fax referral for waitlist. [facility]- per Lelon Frohlich, fax referral.  At capacity: [place] per Jackie Plum   CSW will continue to seek placement.  Verlon Setting, [place] Disposition staff 10/15/2014 6:32 PM

## 2014-10-15 NOTE — ED Provider Notes (Signed)
CSN: [id number]     Arrival date & time 10/15/14  1628 History   First MD Initiated Contact with Patient 10/15/14 1743     Chief Complaint  Patient presents with  . Insomnia  . Depression     (Consider location/radiation/quality/duration/timing/severity/associated sxs/prior Treatment) HPI   Jeremiah [name]  He presents emergency Department with chief complaint of depression, suicidal ideation, and severe insomnia. The patient has a past medical history of hypertension, gastroparesis, bipolar disorder, borderline personality disorder and major depression. He has been seen for the same complaints in the past. Patient states that ever since she was taken off of his injectable testosterone in September. He has had severe difficulty sleeping. He states he was taken off of this medication due to prostatitis. He states that he has taken trazodone, Seroquel,, Unisom, Benadryl and other over-the-counter medications for sleep. He states the pain go 3 weeks at a time without sleeping. Patient states he has not slept in several days. He endorses anhedonia, he feels as if he wants to end his life but states he does not have the energy. He does not have a plan of suicide or access to weapons. Patient endorses erectile dysfunction, which she states has led to finding out that his son is now sleeping with his girlfriend. He states that he sometimes thinks about killing them, but does not have the energy to do it either. Complains of cough productive of white sputum, difficulty with urination. He denies audiovisual hallucinations.   Past Medical History  Diagnosis Date  . Hypertension   . Rectal fistula   . Prostatitis   . Diverticulosis   . Gastroparesis   . Bipolar disorder   . Borderline personality disorder   . Major depression    Past Surgical History  Procedure Laterality Date  . Lumbar spine surgery    . Colonoscopy    . Colonoscopy with propofol N/A 10/12/2014    Procedure: COLONOSCOPY WITH  PROPOFOL;  Surgeon: Hulen Luster, MD;  Location: [facility] ENDOSCOPY;  Service: Gastroenterology;  Laterality: N/A;   History reviewed. No pertinent family history. History  Substance Use Topics  . Smoking status: Light Tobacco Smoker  . Smokeless tobacco: Not on file  . Alcohol Use: No     Comment: every day or every other day (2-4  12oz cans)    Review of Systems  Ten systems reviewed and are negative for acute change, except as noted in the HPI.    Allergies  Ciprofloxacin; Milk-related compounds; Pepto-bismol; Trazodone and nefazodone; and Sulfur  Home Medications   Prior to Admission medications   Medication Sig Start Date End Date Taking? Authorizing Provider  ALPRAZolam Duanne Moron) 1 MG tablet Take 1-2 mg by mouth 3 (three) times daily as needed for anxiety (anxiety).    Yes Historical Provider, MD  amLODipine (NORVASC) 5 MG tablet Take 1 tablet (5 mg total) by mouth daily. 09/12/14  Yes Benjamine Mola, FNP  amoxicillin (AMOXIL) 500 MG tablet Take 500 mg by mouth 2 (two) times daily.   Yes Historical Provider, MD  bisacodyl (DULCOLAX) 5 MG EC tablet Take 5 mg by mouth daily as needed for mild constipation or moderate constipation (constipation).    Yes Historical Provider, MD  cloNIDine (CATAPRES) 0.2 MG tablet Take 1 tablet (0.2 mg total) by mouth 2 (two) times daily. Patient taking differently: Take 0.2 mg by mouth daily.  09/12/14  Yes Benjamine Mola, FNP  diphenhydrAMINE (BENADRYL) 25 MG tablet Take 50 mg by mouth  every 6 (six) hours as needed for sleep (sleep).   Yes Historical Provider, MD  Doxylamine Succinate, Sleep, (SLEEP AID PO) Take 2 tablets by mouth daily as needed (sleep).   Yes Historical Provider, MD  hydrOXYzine (VISTARIL) 25 MG capsule Take 1 capsule (25 mg total) by mouth 3 (three) times daily as needed for anxiety. 10/07/14  Yes Hinda Kehr, MD  magnesium hydroxide (MILK OF MAGNESIA) 400 MG/5ML suspension Take 30 mLs by mouth daily as needed for mild constipation  (constipation).   Yes Historical Provider, MD  metoprolol succinate (TOPROL-XL) 100 MG 24 hr tablet Take 1 tablet (100 mg total) by mouth daily. Take with or immediately following a meal. 09/12/14  Yes Benjamine Mola, FNP  polyethylene glycol (MIRALAX / GLYCOLAX) packet Take 17 g by mouth daily as needed for mild constipation (constipation).   Yes Historical Provider, MD  QUEtiapine (SEROQUEL) 300 MG tablet Take 1 tablet (300 mg total) by mouth at bedtime. 10/07/14  Yes Hinda Kehr, MD  thiamine 100 MG tablet Take 100 mg by mouth daily.   Yes Historical Provider, MD  traZODone (DESYREL) 100 MG tablet Take 200 mg by mouth at bedtime.   Yes Historical Provider, MD  zolpidem (AMBIEN CR) 12.5 MG CR tablet Take 1 tablet (12.5 mg total) by mouth at bedtime as needed for sleep. 10/07/14 10/07/15 Yes Hinda Kehr, MD  calcium carbonate (TUMS EX) 750 MG chewable tablet Chew 1 tablet by mouth daily as needed for heartburn (heartburn).    Historical Provider, MD  divalproex (DEPAKOTE) 500 MG DR tablet Take 1 tablet (500 mg total) by mouth at bedtime. Patient not taking: Reported on 10/15/2014 09/12/14   Benjamine Mola, FNP  fluconazole (DIFLUCAN) 100 MG tablet Take 1 tablet (100 mg total) by mouth daily. Patient not taking: Reported on 10/15/2014 09/13/14   Benjamine Mola, FNP  mirtazapine (REMERON) 30 MG tablet Take 1 tablet (30 mg total) by mouth at bedtime. Patient not taking: Reported on 10/15/2014 09/12/14   Benjamine Mola, FNP   BP 93/70 mmHg  Pulse 61  Temp(Src) 97.9 F (36.6 C) (Oral)  Resp 18  SpO2 99% Physical Exam  Constitutional: He is oriented to person, place, and time. He appears well-developed and well-nourished. No distress.  HENT:  Head: Normocephalic and atraumatic.  Eyes: Conjunctivae are normal. No scleral icterus.  Neck: Normal range of motion. Neck supple.  Cardiovascular: Normal rate, regular rhythm and normal heart sounds.   Pulmonary/Chest: Effort normal and breath sounds normal. No  respiratory distress.  Abdominal: Soft. He exhibits no distension and no mass. There is no tenderness. There is no guarding.  Musculoskeletal: Normal range of motion. He exhibits no edema.  Neurological: He is alert and oriented to person, place, and time.  Skin: Skin is warm and dry. He is not diaphoretic.  Psychiatric: His behavior is normal.  Nursing note and vitals reviewed.   ED Course  Procedures (including critical care time) Labs Review Labs Reviewed  ACETAMINOPHEN LEVEL - Abnormal; Notable for the following:    Acetaminophen (Tylenol), Serum <10 (*)    All other components within normal limits  COMPREHENSIVE METABOLIC PANEL - Abnormal; Notable for the following:    Glucose, Bld 115 (*)    All other components within normal limits  URINE RAPID DRUG SCREEN (HOSP PERFORMED) NOT AT [facility] - Abnormal; Notable for the following:    Benzodiazepines POSITIVE (*)    All other components within normal limits  CBC  ETHANOL  SALICYLATE LEVEL    Imaging Review No results found.   EKG Interpretation None      MDM   Final diagnoses:  Cough  Suicidal ideation   Filed Vitals:   10/15/14 1644 10/15/14 1754  BP: 97/62 93/70  Pulse: 68 61  Temp: 97.9 F (36.6 C) 97.9 F (36.6 C)  TempSrc: Oral Oral  Resp: 63 18  SpO2: 97% 99%    Pateint with anhedonia, suicidal ideation, and insomnia. Soft systolic pressure.     10:25 PM BP 93/70 mmHg  Pulse 61  Temp(Src) 97.9 F (36.6 C) (Oral)  Resp 18  SpO2 99% Patient will be placed for inpatient treatment  Pressure improving. Patient states that he took 2 xanax this morning and that is why his pressure is low. The patient appears safe for psych placement.    Margarita Mail, PA-C 10/17/14 [place], DO 10/20/14 [phone]

## 2014-10-15 NOTE — ED Notes (Signed)
Per BH TTS take the order for consult out and reenter after the patient returns from Xray.

## 2014-10-15 NOTE — ED Notes (Signed)
Bed: [facility] Expected date:  Expected time:  Means of arrival:  Comments: Tcu 32
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
## 2014-10-15 NOTE — ED Notes (Signed)
Bed: [facility]32 Expected date:  Expected time:  Means of arrival:  Comments: 

## 2014-10-16 DIAGNOSIS — R45851 Suicidal ideations: Secondary | ICD-10-CM | POA: Diagnosis not present

## 2014-10-16 DIAGNOSIS — F39 Unspecified mood [affective] disorder: Secondary | ICD-10-CM

## 2014-10-16 DIAGNOSIS — R45850 Homicidal ideations: Secondary | ICD-10-CM | POA: Diagnosis not present

## 2014-10-16 MED ORDER — LOPERAMIDE HCL 2 MG PO CAPS: 2.0000 mg | ORAL_CAPSULE | ORAL | Status: DC | PRN

## 2014-10-16 MED ORDER — LORAZEPAM 1 MG PO TABS: 1.0000 mg | ORAL_TABLET | Freq: Every day | ORAL | Status: DC

## 2014-10-16 MED ORDER — LORAZEPAM 1 MG PO TABS: 1.0000 mg | ORAL_TABLET | Freq: Three times a day (TID) | ORAL | Status: DC

## 2014-10-16 MED ORDER — LORAZEPAM 1 MG PO TABS: 1.0000 mg | ORAL_TABLET | Freq: Two times a day (BID) | ORAL | Status: DC

## 2014-10-16 MED ORDER — HYDROCORTISONE 1 % EX CREA
TOPICAL_CREAM | Freq: Two times a day (BID) | CUTANEOUS | Status: DC
  Administered 2014-10-16 – 2014-10-17 (×2): via TOPICAL
  Filled 2014-10-16: qty 28

## 2014-10-16 MED ORDER — ADULT MULTIVITAMIN W/MINERALS CH
1.0000 | ORAL_TABLET | Freq: Every day | ORAL | Status: DC
  Administered 2014-10-16 – 2014-10-17 (×2): 1 via ORAL
  Filled 2014-10-16 (×2): qty 1

## 2014-10-16 MED ORDER — ONDANSETRON 4 MG PO TBDP: 4.0000 mg | ORAL_TABLET | Freq: Four times a day (QID) | ORAL | Status: DC | PRN

## 2014-10-16 MED ORDER — CLONIDINE HCL 0.1 MG PO TABS
0.2000 mg | ORAL_TABLET | Freq: Every day | ORAL | Status: DC
  Administered 2014-10-16: 0.2 mg via ORAL
  Filled 2014-10-16: qty 2

## 2014-10-16 MED ORDER — LORAZEPAM 1 MG PO TABS
1.0000 mg | ORAL_TABLET | Freq: Four times a day (QID) | ORAL | Status: DC
  Administered 2014-10-16 – 2014-10-17 (×5): 1 mg via ORAL
  Filled 2014-10-16 (×5): qty 1

## 2014-10-16 MED ORDER — METOPROLOL SUCCINATE ER 100 MG PO TB24
100.0000 mg | ORAL_TABLET | Freq: Every day | ORAL | Status: DC
  Administered 2014-10-16 – 2014-10-17 (×2): 100 mg via ORAL
  Filled 2014-10-16 (×2): qty 1

## 2014-10-16 MED ORDER — LORAZEPAM 1 MG PO TABS: 1.0000 mg | ORAL_TABLET | Freq: Four times a day (QID) | ORAL | Status: DC | PRN

## 2014-10-16 NOTE — BHH Counselor (Signed)
Faxed to [facility]. All other facilities at capacity at this time.   Bedelia Person, M.S., LPCA, [place], [facility] Licensed Professional Counselor Associate  Triage Specialist  [facility]  Therapeutic Triage Services Phone: [phone] Fax: [phone]

## 2014-10-16 NOTE — BH Assessment (Signed)
[place] Assessment Progress Note  The following facilities have been contacted to seek placement for this patient, with results as noted:  Beds available, information sent, decision pending:  [place]:  [facility] (no male beds)   Jalene Mullet, [place] Triage Specialist [phone]

## 2014-10-16 NOTE — ED Notes (Signed)
Patient is disoriented at times and anxious.  Confirmed again xanax use saying he was on this for "16 years".  Informed of tapering protocol.  Continues to c/o constipation, anxiety, and "multiple medical problems".
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
## 2014-10-16 NOTE — ED Notes (Signed)
C/O constipation.  PRN laxative requested and received.

## 2014-10-16 NOTE — ED Notes (Signed)
Patient is anxious and tearful. States "I'm used to taking 5 or more xanax a day. I get them from a friend." Reports they are 2mg  "bars".Informed provider.  Ativan protocol initiated.
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
## 2014-10-16 NOTE — BH Assessment (Signed)
Per Helene Kelp at [facility] pt has been accepted to the wait list, and will be placed when a [facility] bed becomes available.   Lear Ng, [facility] Triage Specialist 10/16/2014 6:29 AM

## 2014-10-16 NOTE — Progress Notes (Signed)
CM spoke with pt who confirms self pay [place] resident with no pcp.  CM discussed and provided written information for self pay pcps, discussed the importance of pcp vs EDP services for f/u care, www.needymeds.org, www.goodrx.com, discounted pharmacies and other [place] resources such as [facility] , [facility], affordable care act,  [place] med assist, financial assistance, self pay dental services, [place] med assist, DSS and  health department  Reviewed resources for [place] self pay pcps like [facility], [facility] [address], [facility], [facility], local urgent care centers, [facility], [facility], general [facility] clinics, [facility] [place], [facility] plus others, medication resources, [facility] out patient pharmacies and housing Pt voiced understanding and appreciation of resources provided   Provided P4CC contact information Pt agreed to a referral Cm completed referral Pt to be contact by [facility] clinical liason 

## 2014-10-16 NOTE — BHH Counselor (Signed)
Faxed to [facility]. All other facilities at capacity at this time.   Bedelia Person, M.S., LPCA, [place], [facility] Licensed Professional Counselor Associate  Triage Specialist  [facility]  Therapeutic Triage Services Phone: [phone] Fax: [phone]

## 2014-10-16 NOTE — BHH Counselor (Signed)
Being Reviewed at [facility] for possible placement.

## 2014-10-17 ENCOUNTER — Encounter (HOSPITAL_COMMUNITY): Payer: Self-pay | Admitting: *Deleted

## 2014-10-17 ENCOUNTER — Inpatient Hospital Stay (HOSPITAL_COMMUNITY)
Admission: AD | Admit: 2014-10-17 | Discharge: 2014-10-22 | DRG: 885 | Disposition: A | Discharge status: Home / Self Care | Payer: Federal, State, Local not specified - Other | Source: Intra-hospital | Attending: Psychiatry | Admitting: Psychiatry

## 2014-10-17 DIAGNOSIS — F411 Generalized anxiety disorder: Secondary | ICD-10-CM | POA: Diagnosis present

## 2014-10-17 DIAGNOSIS — F603 Borderline personality disorder: Secondary | ICD-10-CM | POA: Diagnosis present

## 2014-10-17 DIAGNOSIS — I10 Essential (primary) hypertension: Secondary | ICD-10-CM | POA: Diagnosis present

## 2014-10-17 DIAGNOSIS — F17200 Nicotine dependence, unspecified, uncomplicated: Secondary | ICD-10-CM | POA: Diagnosis present

## 2014-10-17 DIAGNOSIS — F1320 Sedative, hypnotic or anxiolytic dependence, uncomplicated: Secondary | ICD-10-CM | POA: Diagnosis present

## 2014-10-17 DIAGNOSIS — R45850 Homicidal ideations: Secondary | ICD-10-CM | POA: Diagnosis present

## 2014-10-17 DIAGNOSIS — G4700 Insomnia, unspecified: Secondary | ICD-10-CM | POA: Diagnosis present

## 2014-10-17 DIAGNOSIS — F39 Unspecified mood [affective] disorder: Secondary | ICD-10-CM | POA: Diagnosis not present

## 2014-10-17 DIAGNOSIS — R45851 Suicidal ideations: Secondary | ICD-10-CM | POA: Diagnosis present

## 2014-10-17 DIAGNOSIS — F3160 Bipolar disorder, current episode mixed, unspecified: Secondary | ICD-10-CM | POA: Diagnosis present

## 2014-10-17 DIAGNOSIS — F339 Major depressive disorder, recurrent, unspecified: Secondary | ICD-10-CM | POA: Diagnosis not present

## 2014-10-17 DIAGNOSIS — F840 Autistic disorder: Secondary | ICD-10-CM | POA: Diagnosis not present

## 2014-10-17 DIAGNOSIS — F3112 Bipolar disorder, current episode manic without psychotic features, moderate: Secondary | ICD-10-CM | POA: Diagnosis present

## 2014-10-17 DIAGNOSIS — F338 Other recurrent depressive disorders: Secondary | ICD-10-CM | POA: Diagnosis not present

## 2014-10-17 DIAGNOSIS — F319 Bipolar disorder, unspecified: Secondary | ICD-10-CM | POA: Diagnosis present

## 2014-10-17 DIAGNOSIS — K3184 Gastroparesis: Secondary | ICD-10-CM | POA: Diagnosis present

## 2014-10-17 DIAGNOSIS — F259 Schizoaffective disorder, unspecified: Secondary | ICD-10-CM | POA: Diagnosis present

## 2014-10-17 DIAGNOSIS — F419 Anxiety disorder, unspecified: Secondary | ICD-10-CM | POA: Diagnosis present

## 2014-10-17 MED ORDER — METOPROLOL SUCCINATE ER 100 MG PO TB24
100.0000 mg | ORAL_TABLET | Freq: Every day | ORAL | Status: DC
  Administered 2014-10-18 – 2014-10-22 (×5): 100 mg via ORAL
  Filled 2014-10-17 (×7): qty 1

## 2014-10-17 MED ORDER — BISACODYL 5 MG PO TBEC
5.0000 mg | DELAYED_RELEASE_TABLET | Freq: Every day | ORAL | Status: DC | PRN
  Administered 2014-10-20 – 2014-10-21 (×2): 5 mg via ORAL
  Filled 2014-10-17 (×2): qty 1

## 2014-10-17 MED ORDER — AMOXICILLIN 500 MG PO CAPS
500.0000 mg | ORAL_CAPSULE | Freq: Two times a day (BID) | ORAL | Status: DC
Start: 2014-10-17 — End: 2014-10-22
  Administered 2014-10-17 – 2014-10-22 (×10): 500 mg via ORAL
  Filled 2014-10-17: qty 10
  Filled 2014-10-17 (×8): qty 1
  Filled 2014-10-17: qty 2
  Filled 2014-10-17 (×2): qty 1
  Filled 2014-10-17: qty 10
  Filled 2014-10-17 (×2): qty 1
  Filled 2014-10-17: qty 2

## 2014-10-17 MED ORDER — DIPHENHYDRAMINE HCL 25 MG PO CAPS
50.0000 mg | ORAL_CAPSULE | Freq: Four times a day (QID) | ORAL | Status: DC | PRN
  Administered 2014-10-17 – 2014-10-20 (×4): 50 mg via ORAL
  Filled 2014-10-17 (×4): qty 2

## 2014-10-17 MED ORDER — POLYETHYLENE GLYCOL 3350 17 G PO PACK: 17.0000 g | PACK | Freq: Every day | ORAL | Status: DC | PRN

## 2014-10-17 MED ORDER — CLONIDINE HCL 0.2 MG PO TABS
0.2000 mg | ORAL_TABLET | Freq: Every day | ORAL | Status: DC
  Filled 2014-10-17: qty 1

## 2014-10-17 MED ORDER — MIRTAZAPINE 30 MG PO TABS
30.0000 mg | ORAL_TABLET | Freq: Every day | ORAL | Status: DC
  Administered 2014-10-17 – 2014-10-19 (×3): 30 mg via ORAL
  Filled 2014-10-17: qty 1
  Filled 2014-10-17: qty 2
  Filled 2014-10-17 (×3): qty 1
  Filled 2014-10-17: qty 2

## 2014-10-17 MED ORDER — QUETIAPINE FUMARATE 300 MG PO TABS
300.0000 mg | ORAL_TABLET | Freq: Every day | ORAL | Status: DC
  Administered 2014-10-17: 300 mg via ORAL
  Filled 2014-10-17 (×4): qty 1

## 2014-10-17 MED ORDER — CLONIDINE HCL 0.2 MG PO TABS
0.2000 mg | ORAL_TABLET | Freq: Every day | ORAL | Status: DC
  Administered 2014-10-17 – 2014-10-21 (×5): 0.2 mg via ORAL
  Filled 2014-10-17 (×2): qty 1
  Filled 2014-10-17: qty 28
  Filled 2014-10-17: qty 1
  Filled 2014-10-17: qty 2
  Filled 2014-10-17 (×2): qty 1
  Filled 2014-10-17: qty 2
  Filled 2014-10-17: qty 1

## 2014-10-17 MED ORDER — MAGNESIUM HYDROXIDE 400 MG/5ML PO SUSP: 30.0000 mL | Freq: Every day | ORAL | Status: DC | PRN

## 2014-10-17 MED ORDER — AMLODIPINE BESYLATE 5 MG PO TABS
5.0000 mg | ORAL_TABLET | Freq: Every day | ORAL | Status: DC
  Administered 2014-10-18 – 2014-10-22 (×5): 5 mg via ORAL
  Filled 2014-10-17 (×8): qty 1

## 2014-10-17 MED ORDER — HYDROXYZINE PAMOATE 25 MG PO CAPS
25.0000 mg | ORAL_CAPSULE | Freq: Three times a day (TID) | ORAL | Status: DC | PRN
  Filled 2014-10-17: qty 1

## 2014-10-17 MED ORDER — MAGNESIUM HYDROXIDE 400 MG/5ML PO SUSP
30.0000 mL | Freq: Every day | ORAL | Status: DC | PRN
Start: 2014-10-17 — End: 2014-10-22
  Administered 2014-10-18 – 2014-10-19 (×2): 30 mL via ORAL
  Filled 2014-10-17 (×2): qty 30

## 2014-10-17 MED ORDER — DIVALPROEX SODIUM 500 MG PO DR TAB
500.0000 mg | DELAYED_RELEASE_TABLET | Freq: Every day | ORAL | Status: DC
  Administered 2014-10-17 – 2014-10-18 (×2): 500 mg via ORAL
  Filled 2014-10-17 (×5): qty 1

## 2014-10-17 MED ORDER — ALUM & MAG HYDROXIDE-SIMETH 200-200-20 MG/5ML PO SUSP
30.0000 mL | ORAL | Status: DC | PRN
  Administered 2014-10-19: 30 mL via ORAL
  Filled 2014-10-17: qty 30

## 2014-10-17 MED ORDER — ACETAMINOPHEN 325 MG PO TABS
650.0000 mg | ORAL_TABLET | Freq: Four times a day (QID) | ORAL | Status: DC | PRN
  Administered 2014-10-19: 650 mg via ORAL
  Filled 2014-10-17 (×2): qty 2

## 2014-10-17 NOTE — ED Notes (Signed)
TTS talked w/ the pt again and he has decided to go to [facility] for admission and can go after 1900
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
## 2014-10-17 NOTE — ED Notes (Signed)
Report called to [place], [facility], pending [facility] transport.

## 2014-10-17 NOTE — ED Notes (Signed)
EDP updated and will eval.

## 2014-10-17 NOTE — Progress Notes (Signed)
Admission Note:  Patient is a 49 year old male who presents voluntarily in no acute distress for the treatment of insomnia, suicide ideation and Depression. Patient stated " I have not been able to sleep since Oct., I need physical help. I take Seroquel and trazodone every night for sleep". Patient also stated " I have too many medical issues to take care off and it's part of my visit today. I hope the Dr will listen to me". Patient appears anxious. Speech rapid and disorganized. Easily redirectable. Patient was calm and cooperative with admission process. Pt denies pain, SI/HI/AVH at this time; verbally contracted for safety. Patient reports history of hypertension, MDD and bipolar.   A: Skin assessed, no abnormalities noted. No tattoo, wounds, bruises noted. POC and unit policies explained and understanding verbalized. Consents obtained. Food and fluids offered.  R: Pt had no additional questions or concerns.
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
## 2014-10-17 NOTE — ED Notes (Signed)
Up on the phone 

## 2014-10-17 NOTE — ED Notes (Signed)
Pt has been evaluated by EDP

## 2014-10-17 NOTE — ED Notes (Signed)
Up tot he bathroom to shower and change scrubs 

## 2014-10-17 NOTE — ED Provider Notes (Signed)
Patient concerned for mouth pain and thrush. No intra-oral plaque like lesions noted. No concern for thrush. Stable to go to [facility].  1. Suicidal ideation   2. Cough      Evelina Bucy, MD 10/17/14 [phone]

## 2014-10-17 NOTE — ED Notes (Signed)
Pt has decided that he wants to go home and not be admitted

## 2014-10-17 NOTE — Tx Team (Signed)
Initial Interdisciplinary Treatment Plan   PATIENT STRESSORS: Health problems Medication change or noncompliance Substance abuse   PATIENT STRENGTHS: Capable of independent living Armed forces logistics/support/administrative officer Motivation for treatment/growth Supportive family/friends   PROBLEM LIST: Problem List/Patient Goals Date to be addressed Date deferred Reason deferred Estimated date of resolution  Suicidal Ideation      Depression      Anxiety      "I need physical help"                                     DISCHARGE CRITERIA:  Ability to meet basic life and health needs Improved stabilization in mood, thinking, and/or behavior Motivation to continue treatment in a less acute level of care Verbal commitment to aftercare and medication compliance  PRELIMINARY DISCHARGE PLAN: Attend PHP/IOP Outpatient therapy Return to previous living arrangement  PATIENT/FAMIILY INVOLVEMENT: This treatment plan has been presented to and reviewed with the patient, Jeremiah [name], and/or family member.  The patient and family have been given the opportunity to ask questions and make suggestions.  [name] 10/17/2014, 11:34 PM

## 2014-10-17 NOTE — ED Notes (Signed)
tts into talk w/ pt

## 2014-10-17 NOTE — ED Notes (Signed)
In the day room

## 2014-10-17 NOTE — ED Notes (Signed)
Pt AAO x 3, no distress noted, calm & cooperative, remains passive SI, monitoring for safety, Q 15 min checks in effect.  Pending [facility] transport and report.

## 2014-10-18 DIAGNOSIS — F3112 Bipolar disorder, current episode manic without psychotic features, moderate: Principal | ICD-10-CM | POA: Diagnosis present

## 2014-10-18 DIAGNOSIS — R45851 Suicidal ideations: Secondary | ICD-10-CM

## 2014-10-18 DIAGNOSIS — G4700 Insomnia, unspecified: Secondary | ICD-10-CM

## 2014-10-18 DIAGNOSIS — F3160 Bipolar disorder, current episode mixed, unspecified: Secondary | ICD-10-CM | POA: Diagnosis present

## 2014-10-18 DIAGNOSIS — R45850 Homicidal ideations: Secondary | ICD-10-CM

## 2014-10-18 LAB — VALPROIC ACID LEVEL: Valproic Acid Lvl: 17 ug/mL — ABNORMAL LOW (ref 50.0–100.0)

## 2014-10-18 MED ORDER — ALPRAZOLAM 0.5 MG PO TABS
1.0000 mg | ORAL_TABLET | Freq: Four times a day (QID) | ORAL | Status: DC | PRN
  Filled 2014-10-18 (×2): qty 2

## 2014-10-18 MED ORDER — CLONIDINE HCL 0.2 MG PO TABS
0.2000 mg | ORAL_TABLET | Freq: Every day | ORAL | Status: DC
  Administered 2014-10-19 – 2014-10-22 (×4): 0.2 mg via ORAL
  Filled 2014-10-18 (×6): qty 1

## 2014-10-18 MED ORDER — ALPRAZOLAM 0.5 MG PO TABS
1.0000 mg | ORAL_TABLET | Freq: Three times a day (TID) | ORAL | Status: DC | PRN
Start: 2014-10-18 — End: 2014-10-20
  Administered 2014-10-18 – 2014-10-20 (×4): 1 mg via ORAL
  Filled 2014-10-18 (×4): qty 2

## 2014-10-18 MED ORDER — OLANZAPINE 10 MG PO TBDP
10.0000 mg | ORAL_TABLET | Freq: Every day | ORAL | Status: DC
  Administered 2014-10-18: 10 mg via ORAL
  Filled 2014-10-18 (×4): qty 1

## 2014-10-18 MED ORDER — NICOTINE 21 MG/24HR TD PT24
21.0000 mg | MEDICATED_PATCH | Freq: Every day | TRANSDERMAL | Status: DC
  Administered 2014-10-18 – 2014-10-22 (×5): 21 mg via TRANSDERMAL
  Filled 2014-10-18 (×5): qty 1
  Filled 2014-10-18: qty 14
  Filled 2014-10-18: qty 1

## 2014-10-18 NOTE — BHH Counselor (Signed)
Attempted to do Psychosocial Assessment with patient, but he is too psychotic/delusional and focused on need to be out of the hospital in order to get the help he needs, states he is dying quickly and just needs out of here.  Wants to proceed with PSA but cannot focus on questions.  Selmer Dominion, LCSW 10/18/2014, 4:19 PM
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
## 2014-10-18 NOTE — Progress Notes (Addendum)
Jeremiah [name]  is hesitant first thing this morning....when he met this nurse.Marland KitchenMarland KitchenHe is fixated on his bowels....saying he hasn't had a BM " since last March" and asking for his clonidine and " my xanax". He paces back and forth in front of the counter. HE can't sit still nor can he concentrate.  HE admits this to this nurse.He completed his daily assessment and on it he wrote  He denied SI and he rated his depression, hopelessness and anxiety " 10/10/8, respectively and he said " I don't know how long I'll be here this time..."   A He takes his medications but is somatically focused...talking most of the day... And all the times he interacts with this nurse, eventually he talks about some type of somatic issue. While he is standing at counter...awaiting getting his noontime medications..he is asking for clonidine and xanax and then he drops to the floor and EASES his entire body down onto the floor and he sits on the floor and says" Im ok..Im  ok...". This nurse obtained pt's vital signs immediately while staff sat with pt. NP MA in to assess pt and he spoke with her, asking her for his clonidine and xanax and then pt was asissted to get up and go to  Dayroom ( which he did) and then after he spoke to NP, the xanax and clonidine were  ordered. He received a prn xanax afterwards and stated he had relief immediately.   R POC cont. Addendum: MD has decreased prn xanax from QID to TID and pt scheduled for labs.

## 2014-10-18 NOTE — BHH Group Notes (Signed)
Jeremiah [name] Group Notes:  (Clinical Social Work)  10/18/2014  [facility] Group Notes:  (Clinical Social Work)  10/18/2014  11:00AM-12:00PM  Summary of Progress/Problems:  The main focus of today's process group was to listen to a variety of genres of music and to identify that different types of music provoke different responses.  The patient then was able to identify personally what was soothing for them, as well as energizing.  Handouts were used to record feelings evoked, as well as how patient can personally use this knowledge in sleep habits, with depression, and with other symptoms.  The patient expressed understanding of concepts, as well as knowledge of how each type of music affected him/her and how this can be used at home as a wellness/recovery tool.  His affect became somewhat more relaxed as he listened to music.  Type of Therapy:  Music Therapy   Participation Level:  Active  Participation Quality:  Attentive and Sharing  Affect:  Blunted and Anxious  Cognitive:  Confused  Insight:  Improving  Engagement in Therapy:  Engaged  Modes of Intervention:   Activity, Exploration  Selmer Dominion, LCSW 10/18/2014

## 2014-10-18 NOTE — Progress Notes (Signed)
Patient oriented to unit policies and medications administered tonight.  Patient is concerned he will not sleep tonight.

## 2014-10-18 NOTE — Progress Notes (Signed)
[facility] Notes:  (Nursing/MHT/Case Management/Adjunct)  Date:  10/18/2014  Time:  8:38 PM  Type of Therapy:  Psychoeducational Skills  Participation Level:  Active  Participation Quality:  Drowsy  Affect:  Flat  Cognitive:  Appropriate  Insight:  Lacking  Engagement in Group:  Developing/Improving  Modes of Intervention:  Education  Summary of Progress/Problems: The patient was slow to respond in group, but spoke after this author waited. He states that he is unable to rest at this time and that he had some visitors this evening. He also indicated that he enjoyed attending today's music group. As a theme for the day, his coping skill will involve listening to music and possibly engaging in exercise.   Archie Balboa S 10/18/2014, 8:38 PM

## 2014-10-18 NOTE — [facility] Suicide Risk Assessment (Addendum)
[facility] Admission Suicide Risk Assessment   Nursing information obtained from:    Demographic factors:    Current Mental Status:    Loss Factors:    Historical Factors:    Risk Reduction Factors:    Total Time spent with patient: 45 minutes Principal Problem: Bipolar 1 disorder Diagnosis:   Patient Active Problem List   Diagnosis Date Noted  . Bipolar 1 disorder, manic, moderate [F31.12] 10/18/2014  . Bipolar 1 disorder [F31.9] 10/17/2014  . Mood disorder [F39] 10/16/2014  . Suicidal ideation [R45.851]   . Anxiety disorder [F41.9] 09/12/2014     Continued Clinical Symptoms:  Alcohol Use Disorder Identification Test Final Score (AUDIT): 15 The "Alcohol Use Disorders Identification Test", Guidelines for Use in Primary Care, Second Edition.  World Pharmacologist [facility]). Score between 0-7:  no or low risk or alcohol related problems. Score between 8-15:  moderate risk of alcohol related problems. Score between 16-19:  high risk of alcohol related problems. Score 20 or above:  warrants further diagnostic evaluation for alcohol dependence and treatment.   CLINICAL FACTORS:   Severe Anxiety and/or Agitation Panic Attacks Bipolar Disorder:   Depressive phase Obsessive-Compulsive Disorder More than one psychiatric diagnosis   Musculoskeletal: Strength & Muscle Tone: within normal limits Gait & Station: normal Patient leans: N/A  Psychiatric Specialty Exam: Physical Exam  Review of Systems  Constitutional: Positive for weight loss and malaise/fatigue.  HENT: Positive for hearing loss.        Pressure bilateral  Eyes: Positive for blurred vision.  Respiratory:       Pack   Cardiovascular: Negative.   Gastrointestinal: Positive for constipation.  Genitourinary: Positive for dysuria.  Musculoskeletal: Positive for myalgias, back pain, joint pain and neck pain.  Skin: Positive for itching.  Neurological: Positive for dizziness, tremors, weakness and headaches.   Endo/Heme/Allergies: Negative.   Psychiatric/Behavioral: Positive for depression and substance abuse. The patient is nervous/anxious and has insomnia.     Blood pressure 115/88, pulse 84, temperature 97.7 F (36.5 C), temperature source Oral, resp. rate 20, height 5\' 5"  (1.651 m), weight 78.019 kg (172 lb), SpO2 100 %.Body mass index is 28.62 kg/(m^2).  General Appearance: Fairly Groomed  Engineer, water::  Fair  Speech:  Clear and Coherent  Volume:  fluctuates  Mood:  Anxious, Depressed, Dysphoric and Hopeless  Affect:  Depressed and anxious worried  Thought Process:  Coherent and Goal Directed  Orientation:  Full (Time, Place, and Person)  Thought Content:  symptoms events worries concerns somatically focused, Somatic delusions?   Suicidal Thoughts:  No  Homicidal Thoughts:  No  Memory:  Immediate;   Fair Recent;   Fair Remote;   Fair  Judgement:  Fair  Insight:  Lacking  Psychomotor Activity:  Restlessness  Concentration:  Fair  Recall:  [employer] of Knowledge:Fair  Language: Fair  Akathisia:  No  Handed:  Right  AIMS (if indicated):     Assets:  Desire for Improvement  Sleep:     Cognition: WNL  ADL's:  Intact     COGNITIVE FEATURES THAT CONTRIBUTE TO RISK:  Closed-mindedness, Polarized thinking and Thought constriction (tunnel vision)    SUICIDE RISK:   Moderate:  49 Y/O male who was admitted after he initially endorsed suicidal ideas. He has not been sleeping, endorses  multiple physical symptoms still thinks he has cancer (remains somatically focused trough the interview) He admits to several stressors like her son having a relationship with his ex GF, not having a stable  place to live, not having transportation or insurance to pursue treatment. He was taking Xanax 2 mg that he cut in half and use up to QID. States he was told by his MD he will never be able to get off the Xanax. States he wants help does not want to die. He also states that he was receiving  Testosterone IM and he felt great. After it was D/C he has not felt well. There is information in the chart from previous assessments and he has been diagnosed with  Generalized Anxiety,  Asperger's, Bipolar Depressed, Bipolar Manic, Somatization Disorder, OCD.  PLAN OF CARE: Supportive approach/coping skills                              Anxiety; will continue the Xanax as he is not going to go off and he still has a current prescription for it. Will decrease to TID rather than QID                               Will continue the Remeron that can help with sleep depression and anxiety                              Somatic Complains; will reassess for the possibility of them being psychotic in nature                               Mood instability; will optimize his response to Depakote/Seroquel                               Will get collateral information                               Will order testosterone levels Medical Decision Making:  Review of Psycho-Social Stressors (1), Review or order clinical lab tests (1), Review of Medication Regimen & Side Effects (2) and Review of New Medication or Change in Dosage (2)  I certify that inpatient services furnished can reasonably be expected to improve the patient's condition.   [place] A 10/18/2014, 2:29 PM

## 2014-10-18 NOTE — H&P (Signed)
Psychiatric Admission Assessment Adult  Patient Identification: Jeremiah [name] MRN:  [id number] Date of Evaluation:  10/18/2014 Chief Complaint:  Schizoaffective Disorder Principal Diagnosis: Bipolar 1 disorder Diagnosis:   Patient Active Problem List   Diagnosis Date Noted  . Bipolar 1 disorder, manic, moderate [F31.12] 10/18/2014  . Bipolar 1 disorder [F31.9] 10/17/2014  . Mood disorder [F39] 10/16/2014  . Suicidal ideation [R45.851]   . Anxiety disorder [F41.9] 09/12/2014   History of Present Illness: Jeremiah [name], 49 yo male, presented initially at the ED with chief complaint of depression, suicidal ideation, and severe insomnia. The patient has a past medical history of hypertension, gastroparesis, bipolar disorder, borderline personality disorder and major depression.  He was last admitted to  [facility] in Apr 2016 for similar symptoms.   Patient states that ever since she was taken off of his injectable testosterone in September.  He has had severe difficulty sleeping and gone without sleep in 3 weeks. He states he was taken off of this medication due to prostatitis. He states that he has taken Trazodone, Seroquel,, Unisom, Benadryl and other over-the-counter medications for sleep.  He reports he has averaged no sleep in 3 weeks time.  He also reports low energy and wants "to end his life."  Patient endorses erectile dysfunction, discovered that is girlfriend is now sleeping with his son.  He states that he sometimes thinks about killing them, but does not have the energy to do it.  Does not endorse AVH.    He was witnessed by staff today apparently unsteady.  He was safely eased to the floor.  This NP examined him and there is no apparent injury and patient has no complaints.  Staff will make patient high risk for falls status.    Elements:  Location:  Depression, Homicidal ideation. Quality:  anger, hopeless, anxious. Severity:  severe. Context:  see HPI. Associated  Signs/Symptoms: Depression Symptoms:  depressed mood, (Hypo) Manic Symptoms:  Labiality of Mood, Anxiety Symptoms:  Excessive Worry, Psychotic Symptoms:  NA PTSD Symptoms: NA Total Time spent with patient: 30 minutes  Past Medical History:  Past Medical History  Diagnosis Date  . Hypertension   . Rectal fistula   . Prostatitis   . Diverticulosis   . Gastroparesis   . Bipolar disorder   . Borderline personality disorder   . Major depression     Past Surgical History  Procedure Laterality Date  . Lumbar spine surgery    . Colonoscopy    . Colonoscopy with propofol N/A 10/12/2014    Procedure: COLONOSCOPY WITH PROPOFOL;  Surgeon: Hulen Luster, MD;  Location: [facility] ENDOSCOPY;  Service: Gastroenterology;  Laterality: N/A;   Family History: History reviewed. No pertinent family history. Social History:  History  Alcohol Use No    Comment: every day or every other day (2-4  12oz cans)     History  Drug Use No    History   Social History  . Marital Status: Single    Spouse Name: N/A  . Number of Children: N/A  . Years of Education: N/A   Social History Main Topics  . Smoking status: Light Tobacco Smoker  . Smokeless tobacco: Not on file  . Alcohol Use: No     Comment: every day or every other day (2-4  12oz cans)  . Drug Use: No  . Sexual Activity: Not on file   Other Topics Concern  . None   Social History Narrative   Additional Social History:  Musculoskeletal: Strength & Muscle Tone: within normal limits Gait & Station: normal Patient leans: N/A  Psychiatric Specialty Exam: Physical Exam  Vitals reviewed.   Review of Systems  Constitutional: Negative for fever.  Cardiovascular: Negative for chest pain.  Neurological: Negative for headaches.  Psychiatric/Behavioral: Positive for depression. The patient is nervous/anxious.     Blood pressure 115/88, pulse 84, temperature 97.7 F (36.5 C), temperature source Oral, resp. rate 20, height _0  (1.651  m), weight 78.019 kg (172 lb), SpO2 100 %.Body mass index is 28.62 kg/(m^2).  General Appearance: Casual  Eye Contact::  Fair  Speech:  Normal Rate  Volume:  Normal  Mood:  Anxious  Affect:  Appropriate  Thought Process:  Coherent  Orientation:  Full (Time, Place, and Person)  Thought Content:  Rumination  Suicidal Thoughts:  No  Homicidal Thoughts:  No  Memory:  Immediate;   Fair Recent;   Fair Remote;   Fair  Judgement:  Negative  Insight:  Fair  Psychomotor Activity:  Increased  Concentration:  Good  Recall:  Good  Fund of Knowledge:Good  Language: Good  Akathisia:  Negative  Handed:  Right  AIMS (if indicated):     Assets:  Desire for Improvement Resilience Social Support  ADL's:  Intact  Cognition: WNL  Sleep:   5 hrs   Risk to Self: Is patient at risk for suicide?: Yes Risk to Others:   Prior Inpatient Therapy:   Prior Outpatient Therapy:    Alcohol Screening: 1. How often do you have a drink containing alcohol?: 4 or more times a week 2. How many drinks containing alcohol do you have on a typical day when you are drinking?: 7, 8, or 9 3. How often do you have six or more drinks on one occasion?: Weekly Preliminary Score: 6 4. How often during the last year have you found that you were not able to stop drinking once you had started?: Less than monthly 5. How often during the last year have you failed to do what was normally expected from you becasue of drinking?: Less than monthly 6. How often during the last year have you needed a first drink in the morning to get yourself going after a heavy drinking session?: Less than monthly 7. How often during the last year have you had a feeling of guilt of remorse after drinking?: Less than monthly 8. How often during the last year have you been unable to remember what happened the night before because you had been drinking?: Less than monthly 9. Have you or someone else been injured as a result of your drinking?: No 10.  Has a relative or friend or a doctor or another health worker been concerned about your drinking or suggested you cut down?: No Alcohol Use Disorder Identification Test Final Score (AUDIT): 15 Brief Intervention: Yes  Allergies:   Allergies  Allergen Reactions  . Ciprofloxacin Other (See Comments)    GI distress  . Milk-Related Compounds Other (See Comments)    GI problems  . Pepto-Bismol [Bismuth Subsalicylate] Other (See Comments)    "Bad reaction"  . Trazodone And Nefazodone Other (See Comments)    Burn tongue and causes blisters  . Sulfur Other (See Comments)    GI distress    Lab Results: No results found for this or any previous visit (from the past 48 hour(s)). Current Medications: Current Facility-Administered Medications  Medication Dose Route Frequency Provider Last Rate Last Dose  . acetaminophen (TYLENOL) tablet 650 mg  650 mg Oral Q6H PRN Harriet Butte, NP      . alum & mag hydroxide-simeth (MAALOX/MYLANTA) 200-200-20 MG/5ML suspension 30 mL  30 mL Oral Q4H PRN Harriet Butte, NP      . amLODipine (NORVASC) tablet 5 mg  5 mg Oral Daily Harriet Butte, NP   5 mg at 10/18/14 0917  . amoxicillin (AMOXIL) capsule 500 mg  500 mg Oral BID Harriet Butte, NP   500 mg at 10/18/14 0917  . bisacodyl (DULCOLAX) EC tablet 5 mg  5 mg Oral Daily PRN Harriet Butte, NP      . cloNIDine (CATAPRES) tablet 0.2 mg  0.2 mg Oral QHS Harriet Butte, NP   0.2 mg at 10/17/14 2329  . diphenhydrAMINE (BENADRYL) capsule 50 mg  50 mg Oral Q6H PRN Harriet Butte, NP   50 mg at 10/17/14 2329  . divalproex (DEPAKOTE) DR tablet 500 mg  500 mg Oral QHS Harriet Butte, NP   500 mg at 10/17/14 2334  . hydrOXYzine (VISTARIL) capsule 25 mg  25 mg Oral TID PRN Harriet Butte, NP      . magnesium hydroxide (MILK OF MAGNESIA) suspension 30 mL  30 mL Oral Daily PRN Harriet Butte, NP   30 mL at 10/18/14 0918  . magnesium hydroxide (MILK OF MAGNESIA) suspension 30 mL  30 mL Oral Daily PRN Harriet Butte, NP      . metoprolol succinate (TOPROL-XL) 24 hr tablet 100 mg  100 mg Oral Daily Harriet Butte, NP   100 mg at 10/18/14 0917  . mirtazapine (REMERON) tablet 30 mg  30 mg Oral QHS Harriet Butte, NP   30 mg at 10/17/14 2329  . nicotine (NICODERM CQ - dosed in mg/24 hours) patch 21 mg  21 mg Transdermal Daily Ursula Alert, MD   21 mg at 10/18/14 0918  . polyethylene glycol (MIRALAX / GLYCOLAX) packet 17 g  17 g Oral Daily PRN Harriet Butte, NP      . QUEtiapine (SEROQUEL) tablet 300 mg  300 mg Oral QHS Harriet Butte, NP   300 mg at 10/17/14 2329   PTA Medications: Prescriptions prior to admission  Medication Sig Dispense Refill Last Dose  . ALPRAZolam (XANAX) 1 MG tablet Take 1-2 mg by mouth 3 (three) times daily as needed for anxiety (anxiety).    10/15/2014 at Unknown time  . amLODipine (NORVASC) 5 MG tablet Take 1 tablet (5 mg total) by mouth daily.   10/15/2014 at Unknown time  . amoxicillin (AMOXIL) 500 MG tablet Take 500 mg by mouth 2 (two) times daily.   10/14/2014 at Unknown time  . bisacodyl (DULCOLAX) 5 MG EC tablet Take 5 mg by mouth daily as needed for mild constipation or moderate constipation (constipation).    Past Month at Unknown time  . calcium carbonate (TUMS EX) 750 MG chewable tablet Chew 1 tablet by mouth daily as needed for heartburn (heartburn).   unknown at unknown time  . cloNIDine (CATAPRES) 0.2 MG tablet Take 1 tablet (0.2 mg total) by mouth 2 (two) times daily. (Patient taking differently: Take 0.2 mg by mouth daily. )   10/15/2014 at 1200  . diphenhydrAMINE (BENADRYL) 25 MG tablet Take 50 mg by mouth every 6 (six) hours as needed for sleep (sleep).   10/14/2014 at Unknown time  . divalproex (DEPAKOTE) 500 MG DR tablet Take 1 tablet (500 mg total) by mouth at bedtime. (Patient  not taking: Reported on 10/15/2014)   Completed Course at Unknown time  . Doxylamine Succinate, Sleep, (SLEEP AID PO) Take 2 tablets by mouth daily as needed (sleep).   10/14/2014 at  Unknown time  . hydrOXYzine (VISTARIL) 25 MG capsule Take 1 capsule (25 mg total) by mouth 3 (three) times daily as needed for anxiety. 45 capsule 0 Past Week at Unknown time  . magnesium hydroxide (MILK OF MAGNESIA) 400 MG/5ML suspension Take 30 mLs by mouth daily as needed for mild constipation (constipation).   Past Week at Unknown time  . metoprolol succinate (TOPROL-XL) 100 MG 24 hr tablet Take 1 tablet (100 mg total) by mouth daily. Take with or immediately following a meal.   10/15/2014 at 1200  . mirtazapine (REMERON) 30 MG tablet Take 1 tablet (30 mg total) by mouth at bedtime. (Patient not taking: Reported on 10/15/2014)   Completed Course at Unknown time  . polyethylene glycol (MIRALAX / GLYCOLAX) packet Take 17 g by mouth daily as needed for mild constipation (constipation).   Past Month at Unknown time  . QUEtiapine (SEROQUEL) 300 MG tablet Take 1 tablet (300 mg total) by mouth at bedtime. 30 tablet 0 Past Week at Unknown time  . thiamine 100 MG tablet Take 100 mg by mouth daily.   10/15/2014 at Unknown time  . traZODone (DESYREL) 100 MG tablet Take 200 mg by mouth at bedtime.   Past Week at Unknown time  . zolpidem (AMBIEN CR) 12.5 MG CR tablet Take 1 tablet (12.5 mg total) by mouth at bedtime as needed for sleep. 30 tablet 1 Past Week at Unknown time  . [DISCONTINUED] fluconazole (DIFLUCAN) 100 MG tablet Take 1 tablet (100 mg total) by mouth daily. (Patient not taking: Reported on 10/15/2014) 13 tablet 0 Not Taking at Unknown time    Previous Psychotropic Medications: Yes   Substance Abuse History in the last 12 months:  Yes.    Consequences of Substance Abuse: NA  Results for orders placed or performed during the hospital encounter of 10/15/14 (from the past 72 hour(s))  Acetaminophen level     Status: Abnormal   Collection Time: 10/15/14  4:52 PM  Result Value Ref Range   Acetaminophen (Tylenol), Serum <10 (L) 10 - 30 ug/mL    Comment:        THERAPEUTIC CONCENTRATIONS  VARY SIGNIFICANTLY. A RANGE OF 10-30 ug/mL MAY BE AN EFFECTIVE CONCENTRATION FOR MANY PATIENTS. HOWEVER, SOME ARE BEST TREATED AT CONCENTRATIONS OUTSIDE THIS RANGE. ACETAMINOPHEN CONCENTRATIONS >150 ug/mL AT 4 HOURS AFTER INGESTION AND >50 ug/mL AT 12 HOURS AFTER INGESTION ARE OFTEN ASSOCIATED WITH TOXIC REACTIONS.   CBC     Status: None   Collection Time: 10/15/14  4:52 PM  Result Value Ref Range   WBC 5.6 4.0 - 10.5 K/uL   RBC 4.55 4.22 - 5.81 MIL/uL   Hemoglobin 13.4 13.0 - 17.0 g/dL   HCT 39.0 39.0 - 52.0 %   MCV 85.7 78.0 - 100.0 fL   MCH 29.5 26.0 - 34.0 pg   MCHC 34.4 30.0 - 36.0 g/dL   RDW 12.6 11.5 - 15.5 %   Platelets 359 150 - 400 K/uL  Comprehensive metabolic panel     Status: Abnormal   Collection Time: 10/15/14  4:52 PM  Result Value Ref Range   Sodium 138 135 - 145 mmol/L   Potassium 4.6 3.5 - 5.1 mmol/L   Chloride 103 101 - 111 mmol/L   CO2 26 22 - 32 mmol/L  Glucose, Bld 115 (H) 65 - 99 mg/dL   BUN 6 6 - 20 mg/dL   Creatinine, Ser 0.92 0.61 - 1.24 mg/dL   Calcium 9.5 8.9 - 10.3 mg/dL   Total Protein 6.9 6.5 - 8.1 g/dL   Albumin 4.2 3.5 - 5.0 g/dL   AST 21 15 - 41 U/L   ALT 22 17 - 63 U/L   Alkaline Phosphatase 53 38 - 126 U/L   Total Bilirubin 1.0 0.3 - 1.2 mg/dL   GFR calc non Af Amer >60 >60 mL/min   GFR calc Af Amer >60 >60 mL/min    Comment: (NOTE) The eGFR has been calculated using the CKD EPI equation. This calculation has not been validated in all clinical situations. eGFR's persistently <60 mL/min signify possible Chronic Kidney Disease.    Anion gap 9 5 - 15  Ethanol (ETOH)     Status: None   Collection Time: 10/15/14  4:52 PM  Result Value Ref Range   Alcohol, Ethyl (B) <5 <5 mg/dL    Comment:        LOWEST DETECTABLE LIMIT FOR SERUM ALCOHOL IS 11 mg/dL FOR MEDICAL PURPOSES ONLY   Salicylate level     Status: None   Collection Time: 10/15/14  4:52 PM  Result Value Ref Range   Salicylate Lvl <0.0 2.8 - 30.0 mg/dL  Urine Drug  Screen     Status: Abnormal   Collection Time: 10/15/14  6:16 PM  Result Value Ref Range   Opiates NONE DETECTED NONE DETECTED   Cocaine NONE DETECTED NONE DETECTED   Benzodiazepines POSITIVE (A) NONE DETECTED   Amphetamines NONE DETECTED NONE DETECTED   Tetrahydrocannabinol NONE DETECTED NONE DETECTED   Barbiturates NONE DETECTED NONE DETECTED    Comment:        DRUG SCREEN FOR MEDICAL PURPOSES ONLY.  IF CONFIRMATION IS NEEDED FOR ANY PURPOSE, NOTIFY LAB WITHIN 5 DAYS.        LOWEST DETECTABLE LIMITS FOR URINE DRUG SCREEN Drug Class       Cutoff (ng/mL) Amphetamine      1000 Barbiturate      200 Benzodiazepine   174 Tricyclics       944 Opiates          300 Cocaine          300 THC              50   Urinalysis, Routine w reflex microscopic (not at [facility])     Status: None   Collection Time: 10/15/14  6:16 PM  Result Value Ref Range   Color, Urine YELLOW YELLOW   APPearance CLEAR CLEAR   Specific Gravity, Urine 1.009 1.005 - 1.030   pH 6.5 5.0 - 8.0   Glucose, UA NEGATIVE NEGATIVE mg/dL   Hgb urine dipstick NEGATIVE NEGATIVE   Bilirubin Urine NEGATIVE NEGATIVE   Ketones, ur NEGATIVE NEGATIVE mg/dL   Protein, ur NEGATIVE NEGATIVE mg/dL   Urobilinogen, UA 0.2 0.0 - 1.0 mg/dL   Nitrite NEGATIVE NEGATIVE   Leukocytes, UA NEGATIVE NEGATIVE    Comment: MICROSCOPIC NOT DONE ON URINES WITH NEGATIVE PROTEIN, BLOOD, LEUKOCYTES, NITRITE, OR GLUCOSE <1000 mg/dL.    Observation Level/Precautions:  15 minute checks  Laboratory:  per ED  Psychotherapy:  group  Medications:  As needed  Consultations:  As needed  Discharge Concerns: Safety   Estimated LOS:  Other:     Psychological Evaluations: Yes   Treatment Plan Summary: Admit for crisis management and mood  stabilization. Medication management to re-stabilize current mood symptoms Group counseling sessions for coping skills Medical consults as needed Review and reinstate any pertinent home medications for other health  problems   Medical Decision Making:  Review of Psycho-Social Stressors (1), Discuss test with performing physician (1), Review and summation of old records (2), Review of Medication Regimen & Side Effects (2) and Review of New Medication or Change in Dosage (2)  I certify that inpatient services furnished can reasonably be expected to improve the patient's condition.   Freda Munro May Agustin AGNP-BC 5/29/201610:11 AM I personally assessed the patient, reviewed the physical exam and labs and formulated the treatment plan Geralyn Flash A. Sabra Heck, M.D.

## 2014-10-19 ENCOUNTER — Other Ambulatory Visit: Payer: Self-pay

## 2014-10-19 DIAGNOSIS — F411 Generalized anxiety disorder: Secondary | ICD-10-CM | POA: Diagnosis present

## 2014-10-19 DIAGNOSIS — F339 Major depressive disorder, recurrent, unspecified: Secondary | ICD-10-CM

## 2014-10-19 DIAGNOSIS — F840 Autistic disorder: Secondary | ICD-10-CM | POA: Diagnosis present

## 2014-10-19 MED ORDER — DIVALPROEX SODIUM ER 500 MG PO TB24
500.0000 mg | ORAL_TABLET | Freq: Every day | ORAL | Status: DC
  Administered 2014-10-19 – 2014-10-21 (×3): 500 mg via ORAL
  Filled 2014-10-19 (×6): qty 1

## 2014-10-19 MED ORDER — DIVALPROEX SODIUM 500 MG PO DR TAB
750.0000 mg | DELAYED_RELEASE_TABLET | Freq: Every day | ORAL | Status: DC
  Filled 2014-10-19: qty 1

## 2014-10-19 MED ORDER — OLANZAPINE 5 MG PO TBDP
5.0000 mg | ORAL_TABLET | Freq: Every day | ORAL | Status: DC
  Filled 2014-10-19 (×3): qty 1

## 2014-10-19 MED ORDER — DIVALPROEX SODIUM 500 MG PO DR TAB
500.0000 mg | DELAYED_RELEASE_TABLET | Freq: Every day | ORAL | Status: DC
  Filled 2014-10-19 (×2): qty 1

## 2014-10-19 NOTE — Progress Notes (Signed)
The focus of this group is to help patients review their daily goal of treatment and discuss progress on daily workbooks. Pt attended the evening group session but responded minimally to discussion prompts from the [facility]. Pt initially asked if he could skip group to pace the hallway, but eventually decided to attend. When asked how his day was, Pt reported having an okay day, the highlight of which was having lasagna for dinner. Pt reported having no additional needs from Nursing Staff this evening. Jeremiah [name] mentioned his goal for the week was to get discharged. Pt appeared distracted in group.

## 2014-10-19 NOTE — Progress Notes (Signed)
D: Pt's mood is depressed. Eye contact is fair. Pt denies SI/HI/AVH. Pt attended group this evening and has been appropriate and cooperative.  A: Support given. Verbalization encouraged. Pt encouraged to come to staff for any concerns. Medications given as prescribed. R: Pt is receptive. No complaints of pain or discomfort at this time. Q15 min safety checks maintained. Pt remains safe on the unit. Will continue to monitor.

## 2014-10-19 NOTE — Progress Notes (Signed)
[facility] MD Progress Note  10/19/2014 4:18 PM Jeremiah [name]  MRN:  [id number] Subjective:  Patient states " I have a bad tooth ace, I have been trying to get help for that , I also have a lot of anxiety sx.'  Objective: Patient seen and chart reviewed.Discussed patient with treatment team.   Patient with hx of anxiety,depression as well as autism spectrum do , presented with worsening anxiety, depression. Pt today continues to have mood lability, anxiety issues. He is focussed on his tooth ache - on amoxicillin.  Pt also with autism spectrum do ( per review of EHR). Pt is at baseline a limited historian- will continue to observe pt, continue medications. Per staff pt is compliant on medications. No disruptive issues noted on the unit.   Principal Problem: MDD (major depressive disorder), recurrent episode, with atypical features Diagnosis:   Patient Active Problem List   Diagnosis Date Noted  . MDD (major depressive disorder), recurrent episode, with atypical features [F33.9] 10/19/2014  . GAD (generalized anxiety disorder) [F41.1] 10/19/2014  . Autism spectrum disorder [F84.0] 10/19/2014  . Tooth ache [K08.8] 10/19/2014  . Suicidal ideation [R45.851]    Total Time spent with patient: 30 minutes   Past Medical History:  Past Medical History  Diagnosis Date  . Hypertension   . Rectal fistula   . Prostatitis   . Diverticulosis   . Gastroparesis   . Bipolar disorder   . Borderline personality disorder   . Major depression     Past Surgical History  Procedure Laterality Date  . Lumbar spine surgery    . Colonoscopy    . Colonoscopy with propofol N/A 10/12/2014    Procedure: COLONOSCOPY WITH PROPOFOL;  Surgeon: Hulen Luster, MD;  Location: [facility] ENDOSCOPY;  Service: Gastroenterology;  Laterality: N/A;   Family History: History reviewed. No pertinent family history. Social History:  History  Alcohol Use No    Comment: every day or every other day (2-4  12oz cans)     History   Drug Use No    History   Social History  . Marital Status: Single    Spouse Name: N/A  . Number of Children: N/A  . Years of Education: N/A   Social History Main Topics  . Smoking status: Light Tobacco Smoker  . Smokeless tobacco: Not on file  . Alcohol Use: No     Comment: every day or every other day (2-4  12oz cans)  . Drug Use: No  . Sexual Activity: Not on file   Other Topics Concern  . None   Social History Narrative   Additional History:    Sleep: Fair  Appetite:  Poor    Musculoskeletal: Strength & Muscle Tone: within normal limits Gait & Station: normal Patient leans: N/A   Psychiatric Specialty Exam: Physical Exam  Review of Systems  Psychiatric/Behavioral: Positive for depression. The patient is nervous/anxious and has insomnia.   All other systems reviewed and are negative.   Blood pressure 112/86, pulse 61, temperature 98 F (36.7 C), temperature source Oral, resp. rate 16, height 5\' 5"  (1.651 m), weight 78.019 kg (172 lb), SpO2 100 %.Body mass index is 28.62 kg/(m^2).  General Appearance: Fairly Groomed  Engineer, water::  Poor  Speech:  Normal Rate  Volume:  Normal  Mood:  Anxious and Depressed  Affect:  Congruent  Thought Process:  Irrelevant  Orientation:  Full (Time, Place, and Person)  Thought Content:  Rumination  Suicidal Thoughts:  No  Homicidal Thoughts:  No  Memory:  Immediate;   Fair Recent;   Fair Remote;   Fair  Judgement:  Fair  Insight:  Shallow  Psychomotor Activity:  Restlessness  Concentration:  Poor  Recall:  [employer] of Knowledge:Fair  Language: Fair  Akathisia:  No  Handed:  Right  AIMS (if indicated):     Assets:  Desire for Improvement  ADL's:  Intact  Cognition: WNL  Sleep:  Number of Hours: 6     Current Medications: Current Facility-Administered Medications  Medication Dose Route Frequency Provider Last Rate Last Dose  . acetaminophen (TYLENOL) tablet 650 mg  650 mg Oral Q6H PRN Harriet Butte, NP       . ALPRAZolam Duanne Moron) tablet 1 mg  1 mg Oral TID PRN Nicholaus Bloom, MD   1 mg at 10/19/14 0949  . alum & mag hydroxide-simeth (MAALOX/MYLANTA) 200-200-20 MG/5ML suspension 30 mL  30 mL Oral Q4H PRN Harriet Butte, NP      . amLODipine (NORVASC) tablet 5 mg  5 mg Oral Daily Harriet Butte, NP   5 mg at 10/19/14 0827  . amoxicillin (AMOXIL) capsule 500 mg  500 mg Oral BID Harriet Butte, NP   500 mg at 10/19/14 2683  . bisacodyl (DULCOLAX) EC tablet 5 mg  5 mg Oral Daily PRN Harriet Butte, NP      . cloNIDine (CATAPRES) tablet 0.2 mg  0.2 mg Oral QHS Harriet Butte, NP   0.2 mg at 10/18/14 2105  . cloNIDine (CATAPRES) tablet 0.2 mg  0.2 mg Oral Daily Nicholaus Bloom, MD   0.2 mg at 10/19/14 0827  . diphenhydrAMINE (BENADRYL) capsule 50 mg  50 mg Oral Q6H PRN Harriet Butte, NP   50 mg at 10/18/14 2216  . divalproex (DEPAKOTE) DR tablet 500 mg  500 mg Oral QHS Kemoni Ortega, MD      . hydrOXYzine (VISTARIL) capsule 25 mg  25 mg Oral TID PRN Harriet Butte, NP      . magnesium hydroxide (MILK OF MAGNESIA) suspension 30 mL  30 mL Oral Daily PRN Harriet Butte, NP   30 mL at 10/18/14 0918  . magnesium hydroxide (MILK OF MAGNESIA) suspension 30 mL  30 mL Oral Daily PRN Harriet Butte, NP      . metoprolol succinate (TOPROL-XL) 24 hr tablet 100 mg  100 mg Oral Daily Harriet Butte, NP   100 mg at 10/19/14 0827  . mirtazapine (REMERON) tablet 30 mg  30 mg Oral QHS Harriet Butte, NP   30 mg at 10/18/14 2105  . nicotine (NICODERM CQ - dosed in mg/24 hours) patch 21 mg  21 mg Transdermal Daily Ursula Alert, MD   21 mg at 10/19/14 0831  . OLANZapine zydis (ZYPREXA) disintegrating tablet 10 mg  10 mg Oral QHS Nicholaus Bloom, MD   10 mg at 10/18/14 2105  . polyethylene glycol (MIRALAX / GLYCOLAX) packet 17 g  17 g Oral Daily PRN Harriet Butte, NP        Lab Results:  Results for orders placed or performed during the hospital encounter of 10/17/14 (from the past 48 hour(s))  Valproic acid  level     Status: Abnormal   Collection Time: 10/18/14  6:33 AM  Result Value Ref Range   Valproic Acid Lvl 17 (L) 50.0 - 100.0 ug/mL    Comment: Performed at [facility]    Physical Findings: AIMS:  , ,  ,  ,  CIWA:    COWS:     Assessment:Pt with hx of depression as well as anxiety, will continue medication management.    Treatment Plan Summary: Daily contact with patient to assess and evaluate symptoms and progress in treatment and Medication management Will continue Zyprexa, but will reduce to 5 mg po qhs for mood lability Will continue Depakote 500 mg po qhs, change to ER - pt has been on it for a long time. Will continue Remeron 30 mg po qhs for affective sx as well as sleep. Will continue Xanax 1 mg po tid prn for anxiety sx. Continue Amoxicillin 500 mg po bid for tooth ache. CSW will work on disposition. Reviewed labs - will order EKG, HB A1C, LIPID PANEL.   Medical Decision Making:  Review of Psycho-Social Stressors (1), Review or order clinical lab tests (1), Review of Last Therapy Session (1), Review of Medication Regimen & Side Effects (2) and Review of New Medication or Change in Dosage (2)     Stephan Draughn MD 10/19/2014, 4:18 PM

## 2014-10-19 NOTE — BHH Group Notes (Signed)
[place] LCSW Group Therapy 10/19/2014  1:15 pm  Type of Therapy: Group Therapy Participation Level: Active  Participation Quality: Attentive, Sharing and Supportive  Affect: Depressed and Flat  Cognitive: Alert and Oriented  Insight: Developing/Improving and Engaged  Engagement in Therapy: Developing/Improving and Engaged  Modes of Intervention: Clarification, Confrontation, Discussion, Education, Exploration,  Limit-setting, Orientation, Problem-solving, Rapport Building, Art therapist, Socialization and Support  Summary of Progress/Problems: Pt identified obstacles faced currently and processed barriers involved in overcoming these obstacles. Pt identified steps necessary for overcoming these obstacles and explored motivation (internal and external) for facing these difficulties head on. Pt further identified one area of concern in their lives and chose a goal to focus on for today. Patient shared that things have "gone downhill" for the past 8 months. 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 Patient reports that he does have a safe place to stay and family support. Transportation to appointments has also been identified as another obstacle in addition to ongoing dental problems. CSW and other group members provided patient with emotional support and encouragement.  Tilden Fossa, MSW, [place] Worker [facility] [phone]

## 2014-10-19 NOTE — Progress Notes (Signed)
D: Pt has anxious affect and mood.  Pt was pacing the hall at the beginning of the shift.  He reports his goal today was "to find out what's going on in my head."  Pt reports blurred vision, reporting "it's just like I ain't seeing clearly."  Pt denies SI/HI, denies hallucinations.  Pt has been visible in milieu interacting with peers and staff.  Pt attended evening group.   A: Introduced self to pt.  Met with pt 1:1 and provided support and encouragement.  Actively listened to pt.  PRN medication administered for pain, sleep, and indigestion. R: Pt refused HS Zyprexa, reporting that he thinks the Zyprexa is causing him to have blurred vision.  Pt was compliant with all other medications.  Pt verbally contracts for safety.  Will continue to monitor and assess.

## 2014-10-19 NOTE — Progress Notes (Signed)
D: Patient continues to talk about his "bowel problems" saying "I have not had a BM since March and I have been using Xanax, and clonidine for sleep". Patient denies pain, SI, AH/VH at this time. Patient reports insomnia and kept coming to this writer asking for the "best sleeping pill" for him to take.  A: Support and encouragement offered to patient. Due medications given as ordered. Every 15 minutes check for safety maintained at all times. Will continue to monitor patient. R: Patient remains appropriate.

## 2014-10-19 NOTE — Plan of Care (Signed)
Problem: Diagnosis: Increased Risk For Suicide Attempt Goal: STG-Patient Will Attend All Groups On The Unit Outcome: Progressing Pt attended evening group on 10/19/14.

## 2014-10-20 DIAGNOSIS — F1320 Sedative, hypnotic or anxiolytic dependence, uncomplicated: Secondary | ICD-10-CM | POA: Diagnosis present

## 2014-10-20 LAB — TSH: TSH: 1.648 u[IU]/mL (ref 0.350–4.500)

## 2014-10-20 LAB — LIPID PANEL
Cholesterol: 164 mg/dL (ref 0–200)
HDL: 34 mg/dL — ABNORMAL LOW (ref >40)
LDL CALC: 77 mg/dL (ref 0–99)
TRIGLYCERIDES: 266 mg/dL — AB (ref <150)
Total CHOL/HDL Ratio: 4.8 RATIO
VLDL: 53 mg/dL — AB (ref 0–40)

## 2014-10-20 LAB — TESTOSTERONE: TESTOSTERONE: 336 ng/dL — AB (ref 348–1197)

## 2014-10-20 MED ORDER — DOXEPIN HCL 10 MG PO CAPS
10.0000 mg | ORAL_CAPSULE | Freq: Every day | ORAL | Status: DC
  Administered 2014-10-20: 10 mg via ORAL
  Filled 2014-10-20 (×2): qty 1

## 2014-10-20 MED ORDER — ALPRAZOLAM 0.5 MG PO TABS
0.5000 mg | ORAL_TABLET | Freq: Three times a day (TID) | ORAL | Status: DC | PRN
Start: 2014-10-20 — End: 2014-10-21
  Administered 2014-10-20 – 2014-10-21 (×3): 0.5 mg via ORAL
  Filled 2014-10-20 (×4): qty 1

## 2014-10-20 MED ORDER — GABAPENTIN 100 MG PO CAPS
200.0000 mg | ORAL_CAPSULE | Freq: Three times a day (TID) | ORAL | Status: DC
  Administered 2014-10-20 – 2014-10-22 (×7): 200 mg via ORAL
  Filled 2014-10-20 (×12): qty 2

## 2014-10-20 MED ORDER — TESTOSTERONE 50 MG/5GM (1%) TD GEL
5.0000 g | Freq: Every day | TRANSDERMAL | Status: DC
  Administered 2014-10-20 – 2014-10-21 (×2): 5 g via TRANSDERMAL
  Filled 2014-10-20 (×2): qty 5

## 2014-10-20 MED ORDER — MAGNESIUM CITRATE PO SOLN
1.0000 | Freq: Once | ORAL | Status: AC
Start: 2014-10-20 — End: 2014-10-20
  Administered 2014-10-20: 1 via ORAL

## 2014-10-20 MED ORDER — MIRTAZAPINE 15 MG PO TABS
45.0000 mg | ORAL_TABLET | Freq: Every day | ORAL | Status: DC
  Administered 2014-10-20 – 2014-10-21 (×2): 45 mg via ORAL
  Filled 2014-10-20: qty 42
  Filled 2014-10-20 (×3): qty 1

## 2014-10-20 NOTE — Tx Team (Signed)
Interdisciplinary Treatment Plan Update (Adult)  Date:  10/20/2014   Time Reviewed:  8:22 AM   Progress in Treatment: Attending groups: Yes. Participating in groups:  Yes. Taking medication as prescribed:  Yes. Tolerating medication:  Yes. Family/Significant othe contact made:  No Patient understands diagnosis:  Yes  As evidenced by seeking help with poor sleep Discussing patient identified problems/goals with staff:  Yes, see initial care plan. Medical problems stabilized or resolved:  Yes. Denies suicidal/homicidal ideation: Yes. Issues/concerns per patient self-inventory:  No. Other:  New problem(s) identified:  Discharge Plan or Barriers: return home, follow up outpt  Reason for Continuation of Hospitalization: Depression Medication stabilization Other; describe Poor sleep  Comments:  Jeremiah [name], 49 yo male, presented initially at the ED with chief complaint of depression, suicidal ideation, and severe insomnia. The patient has a past medical history of hypertension, gastroparesis, bipolar disorder, borderline personality disorder and major depression. He was last admitted to [facility] in Apr 2016 for similar symptoms. Patient states that ever since she was taken off of his injectable testosterone in September. He has had severe difficulty sleeping and gone without sleep in 3 weeks. He states he was taken off of this medication due to prostatitis. He states that he has taken Trazodone, Seroquel,, Unisom, Benadryl and other over-the-counter medications for sleep. He reports he has averaged no sleep in 3 weeks time.  Depakote, Neurontin, Remeron trial  Estimated length of stay: 1-3 days  New goal(s):  Review of initial/current patient goals per problem list:     Attendees: Patient:  10/20/2014 8:22 AM   Family:   10/20/2014 8:22 AM   Physician:  Ursula Alert, MD 10/20/2014 8:22 AM   Nursing:   Marcella Dubs, RN 10/20/2014 8:22 AM   CSW:    Roque Lias, LCSW   10/20/2014 8:22  AM   Other:  10/20/2014 8:22 AM   Other:   10/20/2014 8:22 AM   Other:  Lars Pinks, Nurse CM 10/20/2014 8:22 AM   Other:  Lucinda Dell, [facility] 10/20/2014 8:22 AM   Other:  Norberto Sorenson, [place]  10/20/2014 8:22 AM   Other:  10/20/2014 8:22 AM   Other:  10/20/2014 8:22 AM   Other:  10/20/2014 8:22 AM   Other:  10/20/2014 8:22 AM   Other:  10/20/2014 8:22 AM   Other:   10/20/2014 8:22 AM    Scribe for Treatment Team:   Trish Mage, 10/20/2014 8:22 AM

## 2014-10-20 NOTE — BHH Group Notes (Signed)
[place] LCSW Group Therapy  10/20/2014 , 1:40 PM   Type of Therapy:  Group Therapy  Participation Level:  Active  Participation Quality:  Attentive  Affect:  Appropriate  Cognitive:  Alert  Insight:  Improving  Engagement in Therapy:  Engaged  Modes of Intervention:  Discussion, Exploration and Socialization  Summary of Progress/Problems: Today's group focused on the term Diagnosis.  Participants were asked to define the term, and then pronounce whether it is a negative, positive or neutral term.  Camdon was a minimal participant, but stayed the entire time, and responded when asked direct questions.  Stated that he used to be out-going, but has drawn into a shell since he has encountered financial stress and lost most of the things he had, including a house, car and other belongings.  Stated that he has learned to be patient, and practices patience on a daily basis.  Brought up his tooth, and another patient provided him with information relevant to dental care.  Roque Lias B 10/20/2014 , 1:40 PM

## 2014-10-20 NOTE — Progress Notes (Addendum)
[facility] MD Progress Note  10/20/2014 2:24 PM Jeremiah [name]  MRN:  [id number] Subjective:  Patient states " I feel the medications are working  Objective: Patient seen and chart reviewed.Discussed patient with treatment team.   Patient with hx of anxiety,depression as well as autism spectrum do , presented with worsening anxiety, depression.  Pt today is less anxious , less depressed, more organized than the previous days. Pt continues to struggle with pain, from his tooth , which is a stressor for him. Pt also with sleep issues , would like to try another sleep aid. Discussed several options with patient . Pt would like a trial of Doxepin. Discussed risk of being on BZD for long term. Pt reported being prescribed xanax by Dr.Charles his PCP. Pt to taper himself off of it gradually. Discussed starting gabapentin to assist with this . Per staff pt is compliant on medications. No disruptive issues noted on the unit.   Principal Problem: MDD (major depressive disorder), recurrent episode, with atypical features Diagnosis:   Patient Active Problem List   Diagnosis Date Noted  . 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 H/O autism spectrum disorder [Z86.59] 10/20/2014  . MDD (major depressive disorder), recurrent episode, with atypical features [F33.9] 10/19/2014  . GAD (generalized anxiety disorder) [F41.1] 10/19/2014  . Tooth ache [K08.8] 10/19/2014  . Suicidal ideation [R45.851]    Total Time spent with patient: 30 minutes   Past Medical History:  Past Medical History  Diagnosis Date  . Hypertension   . Rectal fistula   . Prostatitis   . Diverticulosis   . Gastroparesis   . Bipolar disorder   . Borderline personality disorder   . Major depression     Past Surgical History  Procedure Laterality Date  . Lumbar spine surgery    . Colonoscopy    . Colonoscopy with propofol N/A 10/12/2014    Procedure: COLONOSCOPY WITH PROPOFOL;  Surgeon: Hulen Luster, MD;   Location: [facility] ENDOSCOPY;  Service: Gastroenterology;  Laterality: N/A;   Family History: History reviewed. No pertinent family history. Social History:  History  Alcohol Use No    Comment: every day or every other day (2-4  12oz cans)     History  Drug Use No    History   Social History  . Marital Status: Single    Spouse Name: N/A  . Number of Children: N/A  . Years of Education: N/A   Social History Main Topics  . Smoking status: Light Tobacco Smoker  . Smokeless tobacco: Not on file  . Alcohol Use: No     Comment: every day or every other day (2-4  12oz cans)  . Drug Use: No  . Sexual Activity: Not on file   Other Topics Concern  . None   Social History Narrative   Additional History:    Sleep: Poor  Appetite:  Fair    Musculoskeletal: Strength & Muscle Tone: within normal limits Gait & Station: normal Patient leans: N/A   Psychiatric Specialty Exam: Physical Exam  Review of Systems  Neurological: Positive for headaches (tooth ache).  Psychiatric/Behavioral: Positive for depression. The patient is nervous/anxious and has insomnia.   All other systems reviewed and are negative.   Blood pressure 113/85, pulse 62, temperature 97.7 F (36.5 C), temperature source Oral, resp. rate 18, height 5\' 5"  (1.651 m), weight 78.019 kg (172 lb), SpO2 100 %.Body mass index is 28.62 kg/(m^2).  General Appearance: Fairly Groomed  Engineer, water::  [place]  Speech:  Normal Rate  Volume:  Normal  Mood:  Anxious and Depressed improving  Affect:  Congruent  Thought Process:  Goal Directed  Orientation:  Full (Time, Place, and Person)  Thought Content:  Rumination  Suicidal Thoughts:  No  Homicidal Thoughts:  No  Memory:  Immediate;   Fair Recent;   Fair Remote;   Fair  Judgement:  Fair  Insight:  Shallow  Psychomotor Activity:  Normal  Concentration:  Fair  Recall:  [employer] of Knowledge:Fair  Language: Fair  Akathisia:  No  Handed:  Right  AIMS (if indicated):      Assets:  Desire for Improvement  ADL's:  Intact  Cognition: WNL  Sleep:  Number of Hours: 6     Current Medications: Current Facility-Administered Medications  Medication Dose Route Frequency Provider Last Rate Last Dose  . acetaminophen (TYLENOL) tablet 650 mg  650 mg Oral Q6H PRN Harriet Butte, NP   650 mg at 10/19/14 2006  . ALPRAZolam Duanne Moron) tablet 0.5 mg  0.5 mg Oral TID PRN Ursula Alert, MD   0.5 mg at 10/20/14 1328  . alum & mag hydroxide-simeth (MAALOX/MYLANTA) 200-200-20 MG/5ML suspension 30 mL  30 mL Oral Q4H PRN Harriet Butte, NP   30 mL at 10/19/14 2007  . amLODipine (NORVASC) tablet 5 mg  5 mg Oral Daily Harriet Butte, NP   5 mg at 10/20/14 0814  . amoxicillin (AMOXIL) capsule 500 mg  500 mg Oral BID Harriet Butte, NP   500 mg at 10/20/14 0814  . bisacodyl (DULCOLAX) EC tablet 5 mg  5 mg Oral Daily PRN Harriet Butte, NP   5 mg at 10/20/14 0867  . cloNIDine (CATAPRES) tablet 0.2 mg  0.2 mg Oral QHS Harriet Butte, NP   0.2 mg at 10/19/14 2104  . cloNIDine (CATAPRES) tablet 0.2 mg  0.2 mg Oral Daily Nicholaus Bloom, MD   0.2 mg at 10/20/14 6195  . diphenhydrAMINE (BENADRYL) capsule 50 mg  50 mg Oral Q6H PRN Harriet Butte, NP   50 mg at 10/19/14 2107  . divalproex (DEPAKOTE ER) 24 hr tablet 500 mg  500 mg Oral QHS Ursula Alert, MD   500 mg at 10/19/14 2107  . doxepin (SINEQUAN) capsule 10 mg  10 mg Oral QHS Tinika Bucknam, MD      . gabapentin (NEURONTIN) capsule 200 mg  200 mg Oral TID Ursula Alert, MD   200 mg at 10/20/14 1203  . hydrOXYzine (VISTARIL) capsule 25 mg  25 mg Oral TID PRN Harriet Butte, NP      . magnesium citrate solution 1 Bottle  1 Bottle Oral Once Lenice Koper, MD      . magnesium hydroxide (MILK OF MAGNESIA) suspension 30 mL  30 mL Oral Daily PRN Harriet Butte, NP   30 mL at 10/19/14 1708  . magnesium hydroxide (MILK OF MAGNESIA) suspension 30 mL  30 mL Oral Daily PRN Harriet Butte, NP      . metoprolol succinate (TOPROL-XL) 24 hr  tablet 100 mg  100 mg Oral Daily Harriet Butte, NP   100 mg at 10/20/14 0814  . mirtazapine (REMERON) tablet 45 mg  45 mg Oral QHS Pantelis Elgersma, MD      . nicotine (NICODERM CQ - dosed in mg/24 hours) patch 21 mg  21 mg Transdermal Daily Ursula Alert, MD   21 mg at 10/20/14 0814  . polyethylene glycol (MIRALAX /  GLYCOLAX) packet 17 g  17 g Oral Daily PRN Harriet Butte, NP      . testosterone (ANDROGEL) 50 MG/5GM (1%) gel 5 g  5 g Transdermal Daily Ursula Alert, MD        Lab Results:  Results for orders placed or performed during the hospital encounter of 10/17/14 (from the past 48 hour(s))  Testosterone     Status: Abnormal   Collection Time: 10/19/14  6:15 AM  Result Value Ref Range   Testosterone 336 (L) 348 - 1197 ng/dL   Comment, Testosterone Comment     Comment: (NOTE) Adult male reference interval is based on a population of lean males up to 48 years old. Performed At: [facility] [place], [place] [id number] Lindon Romp MD [id number] Performed at [facility]   Lipid panel     Status: Abnormal   Collection Time: 10/20/14  6:35 AM  Result Value Ref Range   Cholesterol 164 0 - 200 mg/dL   Triglycerides 266 (H) <150 mg/dL   HDL 34 (L) >40 mg/dL   Total CHOL/HDL Ratio 4.8 RATIO   VLDL 53 (H) 0 - 40 mg/dL   LDL Cholesterol 77 0 - 99 mg/dL    Comment:        Total Cholesterol/HDL:CHD Risk Coronary Heart Disease Risk Table                     Men   Women  1/2 Average Risk   3.4   3.3  Average Risk       5.0   4.4  2 X Average Risk   9.6   7.1  3 X Average Risk  23.4   11.0        Use the calculated Patient Ratio above and the CHD Risk Table to determine the patient's CHD Risk.        ATP III CLASSIFICATION (LDL):  <100     mg/dL   Optimal  100-129  mg/dL   Near or Above                    Optimal  130-159  mg/dL   Borderline  160-189  mg/dL   High  >190     mg/dL   Very High Performed at [facility]   TSH     Status: None   Collection Time: 10/20/14  6:35 AM  Result Value Ref Range   TSH 1.648 0.350 - 4.500 uIU/mL    Comment: Performed at [facility]    Physical Findings: AIMS:  , ,  ,  ,    CIWA:    COWS:     Assessment:Pt with hx of depression as well as anxiety, presented with worsening mood sx. Pt continues to struggle with insomnia, will continue medication management.    Treatment Plan Summary: Daily contact with patient to assess and evaluate symptoms and progress in treatment and Medication management Will discontinue Zyprexa,since patient states he never had any AH/VH/paranoia.  Will continue Depakote 500 mg po qhs, change to ER - pt has been on it for a long time. Will increase Remeron to 45  mg po qhs for affective sx as well as sleep. Start Doxepin 10 mg po qhs for sleep. Will reduce Xanax to 0.5 mg po tid prn for anxiety sx. I have reviewed  controlled substance database- pt was being prescribed xanax 2 mg po bid by  Dr.Phillip Juanda Crumble. Patient to be started on Gabapentin 200 mg po tid for anxiety sx. Continue Amoxicillin 500 mg po bid for tooth ache. CSW will work on disposition. Reviewed labs - EKG-NSR, Lipid panel - abnormal- will call dietician consult. TSH -WBL. Testosterone level - low - will start testosterone gel for topical application. Pt to follow up with PMD for follow up.   Medical Decision Making:  Review of Psycho-Social Stressors (1), Review or order clinical lab tests (1), Review of Last Therapy Session (1), Review of Medication Regimen & Side Effects (2) and Review of New Medication or Change in Dosage (2)     Jury Caserta MD 10/20/2014, 2:24 PM

## 2014-10-20 NOTE — Progress Notes (Signed)
Patient ID: Jeremiah [name], male   DOB: 24-May-1965, 49 y.o.   MRN: [id number]  Pt currently presents with a flat affect and depressed behavior. Pt reports increasing thoughts "of medical problems and worrying about everything in my life." Pt requests interventions for worrying post discharge. Pt states "Will it ever go away." Pt complains of constipation.   Pt provided with medications per providers orders. Pt's labs and vitals were monitored throughout the day. Pt supported emotionally and encouraged to express concerns and questions. Pt educated on medications. Pt given magnesium citrate to help withy constipation per Md orders.   Pt's safety ensured with 15 minute and environmental checks. Pt currently denies SI/HI and A/V hallucinations. Pt verbally agrees to seek staff if SI/HI or A/VH occurs and to consult with staff before acting on these thoughts. Pt had a bowel movement this afternoon.  Pt to be discharged tomorrow.

## 2014-10-20 NOTE — [facility] Suicide Risk Assessment (Signed)
Jeremiah [name] INPATIENT:  Family/Significant Other Suicide Prevention Education  Suicide Prevention Education:  Education Completed; Jeremiah [name], sister, [phone]  has been identified by the patient as the family member/significant other with whom the patient will be residing, and identified as the person(s) who will aid the patient in the event of a mental health crisis (suicidal ideations/suicide attempt).  With written consent from the patient, the family member/significant other has been provided the following suicide prevention education, prior to the and/or following the discharge of the patient.  The suicide prevention education provided includes the following:  Suicide risk factors  Suicide prevention and interventions  National Suicide Hotline telephone number  [facility] assessment telephone number  [facility] and/or Residential Mobile Crisis Unit telephone number  Request made of family/significant other to:  Remove weapons (e.g., guns, rifles, knives), all items previously/currently identified as safety concern.    Remove drugs/medications (over-the-counter, prescriptions, illicit drugs), all items previously/currently identified as a safety concern.  The family member/significant other verbalizes understanding of the suicide prevention education information provided.  The family member/significant other agrees to remove the items of safety concern listed above.  Jeremiah [name] 10/20/2014, 3:56 PM

## 2014-10-21 LAB — SEX HORMONE BINDING GLOBULIN: Sex Hormone Binding: 45.2 nmol/L (ref 16.5–55.9)

## 2014-10-21 LAB — TESTOSTERONE, FREE: TESTOSTERONE FREE: 4.8 pg/mL — AB (ref 6.8–21.5)

## 2014-10-21 LAB — HEMOGLOBIN A1C
HEMOGLOBIN A1C: 5.7 % — AB (ref 4.8–5.6)
Mean Plasma Glucose: 117 mg/dL

## 2014-10-21 MED ORDER — ALPRAZOLAM 0.5 MG PO TABS
1.0000 mg | ORAL_TABLET | Freq: Three times a day (TID) | ORAL | Status: DC | PRN
  Administered 2014-10-21 – 2014-10-22 (×3): 1 mg via ORAL
  Filled 2014-10-21 (×3): qty 2

## 2014-10-21 MED ORDER — ZOLPIDEM TARTRATE 10 MG PO TABS
10.0000 mg | ORAL_TABLET | Freq: Every day | ORAL | Status: DC
  Administered 2014-10-21: 10 mg via ORAL
  Filled 2014-10-21: qty 1

## 2014-10-21 MED ORDER — HYDROXYZINE HCL 25 MG PO TABS
25.0000 mg | ORAL_TABLET | Freq: Three times a day (TID) | ORAL | Status: DC | PRN
  Administered 2014-10-21: 25 mg via ORAL
  Filled 2014-10-21: qty 1

## 2014-10-21 MED ORDER — GABAPENTIN 300 MG PO CAPS
300.0000 mg | ORAL_CAPSULE | Freq: Every day | ORAL | Status: DC
  Administered 2014-10-21: 300 mg via ORAL
  Filled 2014-10-21 (×3): qty 1

## 2014-10-21 MED ORDER — HYDROXYZINE HCL 50 MG PO TABS
100.0000 mg | ORAL_TABLET | Freq: Every evening | ORAL | Status: DC | PRN
  Administered 2014-10-21: 100 mg via ORAL
  Filled 2014-10-21: qty 2
  Filled 2014-10-21: qty 28

## 2014-10-21 NOTE — Plan of Care (Signed)
Problem: Ineffective individual coping Goal: STG: Pt will be able to identify effective and ineffective STG: Pt will be able to identify effective and ineffective coping patterns  Outcome: Progressing Pt has been attending groups on the unit
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
## 2014-10-21 NOTE — Progress Notes (Signed)
[facility] MD Progress Note  10/21/2014 2:43 PM LEONDRO CORYELL  MRN:  [id number] Subjective:  Patient states " I did not sleep at all last night, I feel very anxious . I am not doing well without my xanax. Why did you decrease my dose. I have tried a lot of other medications for sleep- elavil, seroquel, trazodone , restoril, ambien."   Objective: Patient seen and chart reviewed.Discussed patient with treatment team.   Patient with hx of anxiety,depression as well as autism spectrum do , presented with worsening anxiety, depression.  Pt today appears to be anxious , restless and also had sleep difficulty last night. Pt reports being on several medications in the past for sleep issues - some of them he cannot afford, some of them he has failed. Discussed starting a trial of Ambien tonight . Pt agrees with plan.  Per staff pt was very anxious , labile and did not sleep last night. Pt continues to be very somatic , talks about having multiple somatic complaints , wondering whether he has some GI disorder.  No disruptive issues noted on the unit.   Principal Problem: MDD (major depressive disorder), recurrent episode, with atypical features Diagnosis:   Patient Active Problem List   Diagnosis Date Noted  . 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 H/O autism spectrum disorder [Z86.59] 10/20/2014  . MDD (major depressive disorder), recurrent episode, with atypical features [F33.9] 10/19/2014  . GAD (generalized anxiety disorder) [F41.1] 10/19/2014  . Tooth ache [K08.8] 10/19/2014  . Suicidal ideation [R45.851]    Total Time spent with patient: 30 minutes   Past Medical History:  Past Medical History  Diagnosis Date  . Hypertension   . Rectal fistula   . Prostatitis   . Diverticulosis   . Gastroparesis   . Bipolar disorder   . Borderline personality disorder   . Major depression     Past Surgical History  Procedure Laterality Date  . Lumbar spine surgery    .  Colonoscopy    . Colonoscopy with propofol N/A 10/12/2014    Procedure: COLONOSCOPY WITH PROPOFOL;  Surgeon: Hulen Luster, MD;  [facility];  Service: Gastroenterology;  Laterality: N/A;   Family History: History reviewed. No pertinent family history. Social History:  History  Alcohol Use No    Comment: every day or every other day (2-4  12oz cans)     History  Drug Use No    History   Social History  . Marital Status: Single    Spouse Name: N/A  . Number of Children: N/A  . Years of Education: N/A   Social History Main Topics  . Smoking status: Light Tobacco Smoker  . Smokeless tobacco: Not on file  . Alcohol Use: No     Comment: every day or every other day (2-4  12oz cans)  . Drug Use: No  . Sexual Activity: Not on file   Other Topics Concern  . None   Social History Narrative   Additional History:    Sleep: Poor  Appetite:  Fair    Musculoskeletal: Strength & Muscle Tone: within normal limits Gait & Station: normal Patient leans: N/A   Psychiatric Specialty Exam: Physical Exam  Review of Systems  Psychiatric/Behavioral: The patient is nervous/anxious and has insomnia.   All other systems reviewed and are negative.   Blood pressure 134/98, pulse 70, temperature 97.9 F (36.6 C), temperature source Oral, resp. rate 20, height 5\' 5"  (1.651 m), weight 78.019 kg (  172 lb), SpO2 100 %.Body mass index is 28.62 kg/(m^2).  General Appearance: Fairly Groomed  Engineer, water::  Fair  Speech:  Normal Rate  Volume:  Normal  Mood:  Anxious and Depressed   Affect:  Congruent  Thought Process:  Goal Directed  Orientation:  Full (Time, Place, and Person)  Thought Content:  Rumination  Suicidal Thoughts:  No  Homicidal Thoughts:  No  Memory:  Immediate;   Fair Recent;   Fair Remote;   Fair  Judgement:  Fair  Insight:  Shallow  Psychomotor Activity:  Restlessness  Concentration:  Fair  Recall:  [employer] of Knowledge:Fair  Language: Fair  Akathisia:   No  Handed:  Right  AIMS (if indicated):     Assets:  Desire for Improvement  ADL's:  Intact  Cognition: WNL  Sleep:  Number of Hours: 6     Current Medications: Current Facility-Administered Medications  Medication Dose Route Frequency Provider Last Rate Last Dose  . acetaminophen (TYLENOL) tablet 650 mg  650 mg Oral Q6H PRN Harriet Butte, NP   650 mg at 10/19/14 2006  . ALPRAZolam (XANAX) tablet 1 mg  1 mg Oral TID PRN Ursula Alert, MD      . alum & mag hydroxide-simeth (MAALOX/MYLANTA) 200-200-20 MG/5ML suspension 30 mL  30 mL Oral Q4H PRN Harriet Butte, NP   30 mL at 10/19/14 2007  . amLODipine (NORVASC) tablet 5 mg  5 mg Oral Daily Harriet Butte, NP   5 mg at 10/21/14 0914  . amoxicillin (AMOXIL) capsule 500 mg  500 mg Oral BID Harriet Butte, NP   500 mg at 10/21/14 0915  . bisacodyl (DULCOLAX) EC tablet 5 mg  5 mg Oral Daily PRN Harriet Butte, NP   5 mg at 10/21/14 1157  . cloNIDine (CATAPRES) tablet 0.2 mg  0.2 mg Oral QHS Harriet Butte, NP   0.2 mg at 10/20/14 2108  . cloNIDine (CATAPRES) tablet 0.2 mg  0.2 mg Oral Daily Nicholaus Bloom, MD   0.2 mg at 10/21/14 0914  . diphenhydrAMINE (BENADRYL) capsule 50 mg  50 mg Oral Q6H PRN Harriet Butte, NP   50 mg at 10/20/14 2108  . divalproex (DEPAKOTE ER) 24 hr tablet 500 mg  500 mg Oral QHS Ursula Alert, MD   500 mg at 10/20/14 2108  . gabapentin (NEURONTIN) capsule 200 mg  200 mg Oral TID Ursula Alert, MD   200 mg at 10/21/14 1154  . gabapentin (NEURONTIN) capsule 300 mg  300 mg Oral QHS Tashay Bozich, MD      . hydrOXYzine (ATARAX/VISTARIL) tablet 100 mg  100 mg Oral QHS PRN Zavion Sleight, MD      . magnesium hydroxide (MILK OF MAGNESIA) suspension 30 mL  30 mL Oral Daily PRN Harriet Butte, NP   30 mL at 10/19/14 1708  . magnesium hydroxide (MILK OF MAGNESIA) suspension 30 mL  30 mL Oral Daily PRN Harriet Butte, NP      . metoprolol succinate (TOPROL-XL) 24 hr tablet 100 mg  100 mg Oral Daily Harriet Butte,  NP   100 mg at 10/21/14 0915  . mirtazapine (REMERON) tablet 45 mg  45 mg Oral QHS Ursula Alert, MD   45 mg at 10/20/14 2107  . nicotine (NICODERM CQ - dosed in mg/24 hours) patch 21 mg  21 mg Transdermal Daily Ursula Alert, MD   21 mg at 10/21/14 0914  . polyethylene glycol (MIRALAX /  GLYCOLAX) packet 17 g  17 g Oral Daily PRN Harriet Butte, NP      . testosterone (ANDROGEL) 50 MG/5GM (1%) gel 5 g  5 g Transdermal Daily Kayleann Mccaffery, MD   5 g at 10/21/14 0800  . zolpidem (AMBIEN) tablet 10 mg  10 mg Oral QHS Ursula Alert, MD        Lab Results:  Results for orders placed or performed during the hospital encounter of 10/17/14 (from the past 48 hour(s))  Lipid panel     Status: Abnormal   Collection Time: 10/20/14  6:35 AM  Result Value Ref Range   Cholesterol 164 0 - 200 mg/dL   Triglycerides 266 (H) <150 mg/dL   HDL 34 (L) >40 mg/dL   Total CHOL/HDL Ratio 4.8 RATIO   VLDL 53 (H) 0 - 40 mg/dL   LDL Cholesterol 77 0 - 99 mg/dL    Comment:        Total Cholesterol/HDL:CHD Risk Coronary Heart Disease Risk Table                     Men   Women  1/2 Average Risk   3.4   3.3  Average Risk       5.0   4.4  2 X Average Risk   9.6   7.1  3 X Average Risk  23.4   11.0        Use the calculated Patient Ratio above and the CHD Risk Table to determine the patient's CHD Risk.        ATP III CLASSIFICATION (LDL):  <100     mg/dL   Optimal  100-129  mg/dL   Near or Above                    Optimal  130-159  mg/dL   Borderline  160-189  mg/dL   High  >190     mg/dL   Very High Performed at [facility]   Hemoglobin A1c     Status: Abnormal   Collection Time: 10/20/14  6:35 AM  Result Value Ref Range   Hgb A1c MFr Bld 5.7 (H) 4.8 - 5.6 %    Comment: (NOTE)         Pre-diabetes: 5.7 - 6.4         Diabetes: >6.4         Glycemic control for adults with diabetes: <7.0    Mean Plasma Glucose 117 mg/dL    Comment: (NOTE) Performed At: [facility] [place], [place] [id number] Lindon Romp MD [id number] Performed at [facility]   TSH     Status: None   Collection Time: 10/20/14  6:35 AM  Result Value Ref Range   TSH 1.648 0.350 - 4.500 uIU/mL    Comment: Performed at [facility]    Physical Findings: AIMS:  , ,  ,  ,    CIWA:    COWS:     Assessment:Pt with hx of depression as well as anxiety, presented with worsening mood sx. Pt continues to struggle with insomnia, as well as appears very anxious and restless. Discussed changing his xanax dose back to what he was on yesterday. Will add a new sleep aid. will continue medication management.    Treatment Plan Summary: Daily contact with patient to assess and evaluate symptoms and progress in treatment and Medication management Will continue Depakote 500 mg  po qhs, change to ER - pt has been on it for a long time. Will continue Remeron 45  mg po qhs for affective sx as well as sleep. DC doxepin for lack of efficacy. Will start Ambien 10 mg po qhs for sleep. Will add Vistaril 100 mg po qhs prn for sleep as needed. Will restart his Xanax to 1 mg po tid prn for anxiety sx.Pt is not doing well on the dose reduction.  I have reviewed [place] controlled substance database- pt was being prescribed xanax 2 mg po bid by Dr.Phillip Juanda Crumble. Will continue Gabapentin 200 mg po tid for anxiety sx. Will add Gabapentin 300 mg po qhs for anxiety around bedtime. Continue Amoxicillin 500 mg po bid for tooth ache. CSW will work on disposition. Dietician consult for abnormal lipid panel. Testosterone level - low - will continue testosterone gel for topical application. Pt to follow up with PMD for follow up.   Medical Decision Making:  Review of Psycho-Social Stressors (1), Review or order clinical lab tests (1), Review of Last Therapy Session (1), Review of Medication Regimen & Side Effects (2) and Review of New Medication or Change in Dosage  (2)     Abundio Teuscher MD 10/21/2014, 2:43 PM

## 2014-10-21 NOTE — Progress Notes (Signed)
D: Pt has anxious affect and mood.  Pt has been somatically preoccupied.  Pt reported "I've been disoriented.  My vision ain't too good.  I've been tired."  Pt is oriented to person, place, time, situation.  Pt reports he is supposed to discharge tomorrow "but I'm not ready.  I'm just not feeling like energized or anything.  I'd really like to get feeling better.  I feel like this tooth could be causing an infection, I keep eating and I feel bloated, maybe I need to weighed."  Pt reports he isn't ready for discharge because "I don't feel no happiness at all, I know I can't work, I'm just so down, I'm just not myself, I'm not looking forward to going home, I just moved in with my sister."  Pt denies SI/HI, denies hallucinations, denies pain.  Pt has been visible in milieu interacting with peers and staff appropriately.  Pt attended evening group.   A: Met with pt 1:1 and provided support and encouragement.  Actively listened to pt.  Medications administered per order.  PRN medication administered for anxiety and sleep. R: Pt is compliant with medications.  Pt verbally contracts for safety.  Will continue to monitor and assess.

## 2014-10-21 NOTE — Progress Notes (Addendum)
Patient ID: ERYK BEAVERS, male   DOB: [birth year], 49 y.o.   MRN: [id number]   Pt currently presents with a flat affect and anxious behavior. Pt is somatic and complains of insomnia throughout the day. Pt reports being "tired but I can't sleep" today.   Pt provided with medications per providers orders. Pt's labs and vitals were monitored throughout the day. Pt supported emotionally and encouraged to express concerns and questions. Pt educated on medications. Pt given alternative stress therapy techniques.   Pt's safety ensured with 15 minute and environmental checks. Pt currently denies SI/HI and A/V hallucinations. Pt verbally agrees to seek staff if SI/HI or A/VH occurs and to consult with staff before acting on these thoughts. Pt is redirectable but requires a lot of nursing interventions to maintain control.  Pt able to return demonstrate alternative therapy relaxation techniques and did report some relief.

## 2014-10-21 NOTE — [facility] Group Notes (Signed)
[facility] LCSW Aftercare Discharge Planning Group Note   10/21/2014 2:41 PM  Participation Quality:  Invited.  Chose to not attend.  "I need to get some rest.  I didn't sleep last night."    [place], Barbaraann Rondo B

## 2014-10-22 DIAGNOSIS — F1320 Sedative, hypnotic or anxiolytic dependence, uncomplicated: Secondary | ICD-10-CM

## 2014-10-22 DIAGNOSIS — F411 Generalized anxiety disorder: Secondary | ICD-10-CM

## 2014-10-22 DIAGNOSIS — F840 Autistic disorder: Secondary | ICD-10-CM

## 2014-10-22 DIAGNOSIS — F338 Other recurrent depressive disorders: Secondary | ICD-10-CM

## 2014-10-22 MED ORDER — ZOLPIDEM TARTRATE 10 MG PO TABS: 10.0000 mg | ORAL_TABLET | Freq: Every day | ORAL | Status: DC

## 2014-10-22 MED ORDER — MIRTAZAPINE 45 MG PO TABS: 45.0000 mg | ORAL_TABLET | Freq: Every day | ORAL | Status: DC

## 2014-10-22 MED ORDER — ALPRAZOLAM 1 MG PO TABS: 1.0000 mg | ORAL_TABLET | Freq: Three times a day (TID) | ORAL | Status: DC | PRN

## 2014-10-22 MED ORDER — GABAPENTIN 100 MG PO CAPS: 200.0000 mg | ORAL_CAPSULE | Freq: Three times a day (TID) | ORAL | Status: DC

## 2014-10-22 MED ORDER — DIVALPROEX SODIUM ER 500 MG PO TB24
500.0000 mg | ORAL_TABLET | Freq: Every day | ORAL | Status: DC
  Filled 2014-10-22: qty 14

## 2014-10-22 MED ORDER — AMLODIPINE BESYLATE 5 MG PO TABS: 5.0000 mg | ORAL_TABLET | Freq: Every day | ORAL | Status: DC

## 2014-10-22 MED ORDER — GABAPENTIN 300 MG PO CAPS: 300.0000 mg | ORAL_CAPSULE | Freq: Every day | ORAL | Status: DC

## 2014-10-22 MED ORDER — DIVALPROEX SODIUM ER 500 MG PO TB24: 500.0000 mg | ORAL_TABLET | Freq: Every day | ORAL | Status: DC

## 2014-10-22 MED ORDER — GABAPENTIN 300 MG PO CAPS
300.0000 mg | ORAL_CAPSULE | Freq: Every day | ORAL | Status: DC
  Filled 2014-10-22: qty 14

## 2014-10-22 MED ORDER — GABAPENTIN 100 MG PO CAPS
200.0000 mg | ORAL_CAPSULE | Freq: Three times a day (TID) | ORAL | Status: DC
  Filled 2014-10-22: qty 84

## 2014-10-22 MED ORDER — MIRTAZAPINE 15 MG PO TABS: 45.0000 mg | ORAL_TABLET | Freq: Every day | ORAL | Status: DC

## 2014-10-22 MED ORDER — METOPROLOL SUCCINATE ER 100 MG PO TB24
100.0000 mg | ORAL_TABLET | Freq: Every day | ORAL | Status: DC
  Filled 2014-10-22: qty 14

## 2014-10-22 MED ORDER — NICOTINE 21 MG/24HR TD PT24: 21.0000 mg | MEDICATED_PATCH | Freq: Every day | TRANSDERMAL | Status: DC

## 2014-10-22 MED ORDER — CLONIDINE HCL 0.2 MG PO TABS: 0.2000 mg | ORAL_TABLET | Freq: Every day | ORAL | Status: DC

## 2014-10-22 MED ORDER — AMOXICILLIN 500 MG PO CAPS: 500.0000 mg | ORAL_CAPSULE | Freq: Two times a day (BID) | ORAL | Status: DC

## 2014-10-22 MED ORDER — AMLODIPINE BESYLATE 5 MG PO TABS
5.0000 mg | ORAL_TABLET | Freq: Every day | ORAL | Status: DC
  Filled 2014-10-22: qty 14

## 2014-10-22 MED ORDER — HYDROXYZINE HCL 50 MG PO TABS: 100.0000 mg | ORAL_TABLET | Freq: Every evening | ORAL | Status: DC | PRN

## 2014-10-22 MED ORDER — CLONIDINE HCL 0.2 MG PO TABS
0.2000 mg | ORAL_TABLET | Freq: Two times a day (BID) | ORAL | Status: DC
  Filled 2014-10-22: qty 28

## 2014-10-22 MED ORDER — METOPROLOL SUCCINATE ER 100 MG PO TB24: 100.0000 mg | ORAL_TABLET | Freq: Every day | ORAL | Status: DC

## 2014-10-22 MED ORDER — TESTOSTERONE 50 MG/5GM (1%) TD GEL: 5.0000 g | Freq: Every day | TRANSDERMAL | Status: DC

## 2014-10-22 NOTE — Progress Notes (Signed)
D: After introduction the writer asked pt about his day. Pt complained of insomnia stating that he's only had 2 hours of sleep in several days. Pt informed the writer that Lorrin Mais was added to his sleep regimen. Writer discussed pt's pending discharge. Pt stated he plans to stay with his sister. Stated, "things have hectically changed for me". Pt stated that he's not able to drive or work at this time.   A: Pt was given scheduled hs meds as well as requested prn. Support and encouragement was offered. 15 min checks continued for safety.  R: Pt remains safe.

## 2014-10-22 NOTE — Plan of Care (Signed)
Problem: Food- and Nutrition-Related Knowledge Deficit (NB-1.1) Goal: Nutrition education Formal process to instruct or train a patient/client in a skill or to impart knowledge to help patients/clients voluntarily manage or modify food choices and eating behavior to maintain or improve health. Outcome: Completed/Met Date Met:  10/22/14 Nutrition Education Note  RD consulted for nutrition education regarding hyperlipidemia.  Lipid Panel     Component Value Date/Time    CHOL 164 10/20/2014 0635    TRIG 266* 10/20/2014 0635    HDL 34* 10/20/2014 0635    CHOLHDL 4.8 10/20/2014 0635    VLDL 53* 10/20/2014 0635    LDLCALC 77 10/20/2014 0635    RD provided "High Triglycerides Nutrition Therapy" handout from the Academy of Nutrition and Dietetics. Reviewed patient's dietary recall. Provided examples on ways to decrease sugar and fat intake in diet. Discouraged intake of processed foods and consumption of sugar-sweetened beverages. Encouraged fresh fruits and vegetables as well as whole grain sources of carbohydrates to maximize fiber intake. Teach back method used.  Expect fair compliance as pt seemed preoccupied with upcoming discharge. Pt states he does not feel ready for discharge d/t where he will be staying. Pt reports poor quality water (states it is green) where he will live.  Body mass index is 28.62 kg/(m^2). Pt meets criteria for overweight based on current BMI.  Diet Order: Diet regular Room service appropriate?: Yes; Fluid consistency:: Thin Pt is also offered choice of unit snacks mid-morning and mid-afternoon.  Pt is eating as desired.   Labs and medications reviewed. No further nutrition interventions warranted at this time. If additional nutrition issues arise, please re-consult RD.  Clayton Bibles, MS, RD, LDN Pager: [phone] After Hours Pager: [phone]

## 2014-10-22 NOTE — Progress Notes (Signed)
  [facility] Adult Case Management Discharge Plan :  Will you be returning to the same living situation after discharge:  Yes,  home with sister At discharge, do you have transportation home?: Yes,  family Do you have the ability to pay for your medications: Yes,  mental health  Release of information consent forms completed and in the chart;  Patient's signature needed at discharge.  Patient to Follow up at: Follow-up Information    Follow up with [facility].   Why:  Go to the walk-in clinic M-F between 8 and 11AM for your hospital follow up appointment   Contact information:   [place] [phone]      Follow up with [facility] On 10/28/2014.   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 (Cigarettes, Smokeless Tobacco, Cigars, and/or Pipes): Yes  Has patient been referred to the Quitline?: Yes, faxed on 10/22/14    Jeremiah [name] 10/22/2014, 1:05 PM

## 2014-10-22 NOTE — BHH Suicide Risk Assessment (Signed)
[facility] Discharge Suicide Risk Assessment   Demographic Factors:  Male and Caucasian  Total Time spent with patient: 30 minutes  Musculoskeletal: Strength & Muscle Tone: within normal limits Gait & Station: normal Patient leans: N/A  Psychiatric Specialty Exam: Physical Exam  Review of Systems  Neurological: Positive for headaches (tooth ache).  Psychiatric/Behavioral: The patient is nervous/anxious (stable).   All other systems reviewed and are negative.   Blood pressure 102/84, pulse 66, temperature 98.1 F (36.7 C), temperature source Oral, resp. rate 18, height 5\' 5"  (1.651 m), weight 78.019 kg (172 lb), SpO2 100 %.Body mass index is 28.62 kg/(m^2).  General Appearance: Casual  Eye Contact::  Fair  Speech:  Normal Rate409  Volume:  Normal  Mood:  Anxious  Affect:  Congruent  Thought Process:  Coherent  Orientation:  Full (Time, Place, and Person)  Thought Content:  WDL  Suicidal Thoughts:  No   Homicidal Thoughts:  No  Memory:  Immediate;   Fair Recent;   Fair Remote;   Fair  Judgement:  Fair  Insight:  Fair  Psychomotor Activity:  Normal  Concentration:  Fair  Recall:  [employer] of Knowledge:Fair  Language: Fair  Akathisia:  No  Handed:  Right  AIMS (if indicated):     Assets:  Communication Skills Desire for Improvement  Sleep:  Number of Hours: 6  Cognition: WNL  ADL's:  Intact   Have you used any form of tobacco in the last 30 days? (Cigarettes, Smokeless Tobacco, Cigars, and/or Pipes): Yes  Has this patient used any form of tobacco in the last 30 days? (Cigarettes, Smokeless Tobacco, Cigars, and/or Pipes) Yes, Prescription given nicotine patch.  Mental Status Per Nursing Assessment::   On Admission:     Current Mental Status by Physician: pt denied SI/HI/AH/VH- patient anxious about going home since he has no job and does not want to depend on his sister too long. Pt also feels that he has sexual problems due to low testosterone and if that cannot be  fixed , he does not want to live. However patient after education and support about follow up with out patient provider for his sexual issues ( on 10/28/14) - calmed down and voiced understanding that he will try to take care of his problems , and follow up with after care as scheduled.  Loss Factors: NA  Historical Factors: Impulsivity  Risk Reduction Factors:   Positive therapeutic relationship  Continued Clinical Symptoms:  Alcohol/Substance Abuse/Dependencies Previous Psychiatric Diagnoses and Treatments  Cognitive Features That Contribute To Risk:  Polarized thinking    Suicide Risk:  Minimal: No identifiable suicidal ideation.  Patients presenting with no risk factors but with morbid ruminations; may be classified as minimal risk based on the severity of the depressive symptoms  Principal Problem: MDD (major depressive disorder), recurrent episode, with atypical features Discharge Diagnoses:  Patient Active Problem List   Diagnosis Date Noted  . 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 H/O autism spectrum disorder [Z86.59] 10/20/2014  . MDD (major depressive disorder), recurrent episode, with atypical features [F33.9] 10/19/2014  . GAD (generalized anxiety disorder) [F41.1] 10/19/2014  . Tooth ache [K08.8] 10/19/2014  . Suicidal ideation [R45.851]     Follow-up Information    Follow up with [facility].   Why:  Go to the walk-in clinic M-F between 8 and 11AM for your hospital follow up appointment   Contact information:   [place] [phone]  Follow up with [facility] On 10/28/2014.   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

## 2014-10-22 NOTE — Progress Notes (Signed)
Pt is D/C home. Pt denies SI/HI/AV. Pt follow-up and medications were reviewed and pt verbalized understanding. Pt pleasant and cooperative. Pt belongings were returned.

## 2014-10-22 NOTE — Discharge Summary (Signed)
Physician Discharge Summary Note  Patient:  Jeremiah [name] is an 49 y.o., male MRN:  [id number] DOB:  Mar 04, 1966 Patient phone:  [phone] (home)  Patient address:    [place] [address],  Total Time spent with patient: 45 minutes  Date of Admission:  10/17/2014 Date of Discharge: 10/22/14  Reason for Admission:   Jeremiah [name], 49 yo male, presented initially at the ED with chief complaint of depression, suicidal ideation, and severe insomnia. The patient has a past medical history of hypertension, gastroparesis, bipolar disorder, borderline personality disorder and major depression. He was last admitted to [facility] in Apr 2016 for similar symptoms. Patient states that ever since she was taken off of his injectable testosterone in September. He has had severe difficulty sleeping and gone without sleep in 3 weeks. He states he was taken off of this medication due to prostatitis. He states that he has taken Trazodone, Seroquel,, Unisom, Benadryl and other over-the-counter medications for sleep. He reports he has averaged no sleep in 3 weeks time.  He also reports low energy and wants "to end his life." Patient endorses erectile dysfunction, discovered that is girlfriend is now sleeping with his son. He states that he sometimes thinks about killing them, but does not have the energy to do it. Does not endorse AVH.   He was witnessed by staff today apparently unsteady. He was safely eased to the floor. This NP examined him and there is no apparent injury and patient has no complaints. Staff will make patient high risk for falls status.  Principal Problem: MDD (major depressive disorder), recurrent episode, with atypical features Discharge Diagnoses: Patient Active Problem List   Diagnosis Date Noted  . 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 H/O autism spectrum disorder [Z86.59] 10/20/2014  . MDD (major depressive disorder), recurrent  episode, with atypical features [F33.9] 10/19/2014  . GAD (generalized anxiety disorder) [F41.1] 10/19/2014  . Tooth ache [K08.8] 10/19/2014  . Suicidal ideation [R45.851]     Musculoskeletal: Strength & Muscle Tone: within normal limits Gait & Station: normal Patient leans: N/A  Psychiatric Specialty Exam: Physical Exam  Review of Systems  Psychiatric/Behavioral: Positive for depression. Negative for suicidal ideas. The patient is nervous/anxious.   All other systems reviewed and are negative.   Blood pressure 102/84, pulse 66, temperature 98.1 F (36.7 C), temperature source Oral, resp. rate 18, height 5\' 5"  (1.651 m), weight 78.019 kg (172 lb), SpO2 100 %.Body mass index is 28.62 kg/(m^2).    See MD PSE within the [facility]RA  Have you used any form of tobacco in the last 30 days? (Cigarettes, Smokeless Tobacco, Cigars, and/or Pipes): Yes  Has this patient used any form of tobacco in the last 30 days? (Cigarettes, Smokeless Tobacco, Cigars, and/or Pipes) Yes, A prescription for an FDA-approved tobacco cessation medication was offered at discharge and pt accepted.   Past Medical History:  Past Medical History  Diagnosis Date  . Hypertension   . Rectal fistula   . Prostatitis   . Diverticulosis   . Gastroparesis   . Bipolar disorder   . Borderline personality disorder   . Major depression     Past Surgical History  Procedure Laterality Date  . Lumbar spine surgery    . Colonoscopy    . Colonoscopy with propofol N/A 10/12/2014    Procedure: COLONOSCOPY WITH PROPOFOL;  Surgeon: Hulen Luster, MD;  Location: [facility] ENDOSCOPY;  Service: Gastroenterology;  Laterality: N/A;   Family History:  History reviewed. No pertinent family history. Social History:  History  Alcohol Use No    Comment: every day or every other day (2-4  12oz cans)     History  Drug Use No    History   Social History  . Marital Status: Single    Spouse Name: N/A  . Number of Children: N/A  . Years of  Education: N/A   Social History Main Topics  . Smoking status: Light Tobacco Smoker  . Smokeless tobacco: Not on file  . Alcohol Use: No     Comment: every day or every other day (2-4  12oz cans)  . Drug Use: No  . Sexual Activity: Not on file   Other Topics Concern  . None   Social History Narrative    Risk to Self: Is patient at risk for suicide?: Yes What has been your use of drugs/alcohol within the last 12 months?: Drank a little bit through the week - denies abuse Risk to Others:   Prior Inpatient Therapy:   Prior Outpatient Therapy:    Level of Care:  OP  Hospital Course:   Jeremiah [name] was admitted for MDD (major depressive disorder), recurrent episode, with atypical features , with psychosis and crisis management.  Pt was treated discharged with the medications listed below under Medication List.  Medical problems were identified and treated as needed. Home medications were restarted as appropriate.  Improvement was monitored by observation and Jeremiah [name]'s daily report of symptom reduction.  Emotional and mental status was monitored by daily self-inventory reports completed by Jeremiah [name] and clinical staff.         Jeremiah [name] was evaluated by the treatment team for stability and plans for continued recovery upon discharge. Jeremiah [name] 's motivation was an integral factor for scheduling further treatment. Employment, transportation, bed availability, health status, family support, and any pending legal issues were also considered during hospital stay. Pt was offered further treatment options upon discharge including but not limited to Residential, Intensive Outpatient, and Outpatient treatment.  Jeremiah [name] will follow up with the services as listed below under Follow Up Information.     Upon completion of this admission the patient was both mentally and medically stable for discharge denying suicidal/homicidal ideation, auditory/visual/tactile  hallucinations, delusional thoughts and paranoia.    Consults:  None  Significant Diagnostic Studies:  labs: Triglycerides 266 (H), Hgb A1C 5.7 (borderline) Testosterone 336ng/dl (L), UDS positive for benzodiazepines (prescribed xanax)  Discharge Vitals:   Blood pressure 102/84, pulse 66, temperature 98.1 F (36.7 C), temperature source Oral, resp. rate 18, height 5\' 5"  (1.651 m), weight 78.019 kg (172 lb), SpO2 100 %. Body mass index is 28.62 kg/(m^2). Lab Results:   Results for orders placed or performed during the hospital encounter of 10/17/14 (from the past 72 hour(s))  Lipid panel     Status: Abnormal   Collection Time: 10/20/14  6:35 AM  Result Value Ref Range   Cholesterol 164 0 - 200 mg/dL   Triglycerides 266 (H) <150 mg/dL   HDL 34 (L) >40 mg/dL   Total CHOL/HDL Ratio 4.8 RATIO   VLDL 53 (H) 0 - 40 mg/dL   LDL Cholesterol 77 0 - 99 mg/dL    Comment:        Total Cholesterol/HDL:CHD Risk Coronary Heart Disease Risk Table                     Men  Women  1/2 Average Risk   3.4   3.3  Average Risk       5.0   4.4  2 X Average Risk   9.6   7.1  3 X Average Risk  23.4   11.0        Use the calculated Patient Ratio above and the CHD Risk Table to determine the patient's CHD Risk.        ATP III CLASSIFICATION (LDL):  <100     mg/dL   Optimal  100-129  mg/dL   Near or Above                    Optimal  130-159  mg/dL   Borderline  160-189  mg/dL   High  >190     mg/dL   Very High Performed at [facility]   Hemoglobin A1c     Status: Abnormal   Collection Time: 10/20/14  6:35 AM  Result Value Ref Range   Hgb A1c MFr Bld 5.7 (H) 4.8 - 5.6 %    Comment: (NOTE)         Pre-diabetes: 5.7 - 6.4         Diabetes: >6.4         Glycemic control for adults with diabetes: <7.0    Mean Plasma Glucose 117 mg/dL    Comment: (NOTE) Performed At: [facility] [place], [place] [id number] Lindon Romp MD [id number] Performed at [facility]   TSH     Status: None   Collection Time: 10/20/14  6:35 AM  Result Value Ref Range   TSH 1.648 0.350 - 4.500 uIU/mL    Comment: Performed at [facility]    Physical Findings: AIMS: Facial and Oral Movements Muscles of Facial Expression: None, normal Lips and Perioral Area: None, normal Jaw: None, normal Tongue: None, normal,Extremity Movements Upper (arms, wrists, hands, fingers): None, normal Lower (legs, knees, ankles, toes): None, normal, Trunk Movements Neck, shoulders, hips: None, normal, Overall Severity Severity of abnormal movements (highest score from questions above): None, normal Incapacitation due to abnormal movements: None, normal Patient's awareness of abnormal movements (rate only patient's report): No Awareness,    CIWA:    COWS:      See Psychiatric Specialty Exam and Suicide Risk Assessment completed by Attending Physician prior to discharge.  Discharge destination:  Home  Is patient on multiple antipsychotic therapies at discharge:  No   Has Patient had three or more failed trials of antipsychotic monotherapy by history:  No   Recommended Plan for Multiple Antipsychotic Therapies: NA     Medication List    STOP taking these medications        amoxicillin 500 MG tablet  Commonly known as:  AMOXIL  Replaced by:  amoxicillin 500 MG capsule     diphenhydrAMINE 25 MG tablet  Commonly known as:  BENADRYL     divalproex 500 MG DR tablet  Commonly known as:  DEPAKOTE  Replaced by:  divalproex 500 MG 24 hr tablet     hydrOXYzine 25 MG capsule  Commonly known as:  VISTARIL     magnesium hydroxide 400 MG/5ML suspension  Commonly known as:  MILK OF MAGNESIA     QUEtiapine 300 MG tablet  Commonly known as:  SEROQUEL     SLEEP AID PO     thiamine 100 MG tablet     traZODone 100 MG tablet  Commonly  known as:  DESYREL     zolpidem 12.5 MG CR tablet  Commonly known as:  AMBIEN CR  Replaced by:   zolpidem 10 MG tablet      TAKE these medications      Indication   ALPRAZolam 1 MG tablet  Commonly known as:  XANAX  Take 1 tablet (1 mg total) by mouth 3 (three) times daily as needed for anxiety.   Indication:  Feeling Anxious     amLODipine 5 MG tablet  Commonly known as:  NORVASC  Take 1 tablet (5 mg total) by mouth daily.   Indication:  High Blood Pressure     amoxicillin 500 MG capsule  Commonly known as:  AMOXIL  Take 1 capsule (500 mg total) by mouth 2 (two) times daily.   Indication:  dental abcess     bisacodyl 5 MG EC tablet  Commonly known as:  DULCOLAX  Take 5 mg by mouth daily as needed for mild constipation or moderate constipation (constipation).      calcium carbonate 750 MG chewable tablet  Commonly known as:  TUMS EX  Chew 1 tablet by mouth daily as needed for heartburn (heartburn).      cloNIDine 0.2 MG tablet  Commonly known as:  CATAPRES  Take 1 tablet (0.2 mg total) by mouth at bedtime.   Indication:  High Blood Pressure     cloNIDine 0.2 MG tablet  Commonly known as:  CATAPRES  Take 1 tablet (0.2 mg total) by mouth daily.   Indication:  High Blood Pressure     divalproex 500 MG 24 hr tablet  Commonly known as:  DEPAKOTE ER  Take 1 tablet (500 mg total) by mouth at bedtime.   Indication:  mood stabilization     gabapentin 100 MG capsule  Commonly known as:  NEURONTIN  Take 2 capsules (200 mg total) by mouth 3 (three) times daily.   Indication:  mood stabilization     gabapentin 300 MG capsule  Commonly known as:  NEURONTIN  Take 1 capsule (300 mg total) by mouth at bedtime.   Indication:  insomnia     hydrOXYzine 50 MG tablet  Commonly known as:  ATARAX/VISTARIL  Take 2 tablets (100 mg total) by mouth at bedtime as needed (sleep).   Indication:  insomnia     metoprolol succinate 100 MG 24 hr tablet  Commonly known as:  TOPROL-XL  Take 1 tablet (100 mg total) by mouth daily. Take with or immediately following a meal.   Indication:   High Blood Pressure     mirtazapine 45 MG tablet  Commonly known as:  REMERON  Take 1 tablet (45 mg total) by mouth at bedtime.   Indication:  Trouble Sleeping, Major Depressive Disorder     nicotine 21 mg/24hr patch  Commonly known as:  NICODERM CQ - dosed in mg/24 hours  Place 1 patch (21 mg total) onto the skin daily.   Indication:  Nicotine Addiction     polyethylene glycol packet  Commonly known as:  MIRALAX / GLYCOLAX  Take 17 g by mouth daily as needed for mild constipation (constipation).      testosterone 50 MG/5GM (1%) Gel  Commonly known as:  ANDROGEL  Place 5 g onto the skin daily.   Indication:  Deficient Activity of Testis or Ovary     zolpidem 10 MG tablet  Commonly known as:  AMBIEN  Take 1 tablet (10 mg total) by mouth at bedtime.   Indication:  Trouble Sleeping           Follow-up Information    Follow up with [facility].   Contact information:   [address] [address] [phone]      Follow up with [facility]    . Schedule an appointment as soon as possible for a visit today.   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  Comments:   Take all medications as prescribed. Keep all follow-up appointments as scheduled.  Do not consume alcohol or use illegal drugs while on prescription medications. Report any adverse effects from your medications to your primary care provider promptly.  In the event of recurrent symptoms or worsening symptoms, call 911, a crisis hotline, or go to the nearest emergency department for evaluation.   Total Discharge Time: Greater than 30 minutes  Signed: Benjamine Mola, FNP-BC  10/22/2014, 9:51 AM

## 2014-10-24 LAB — TESTOSTERONE, % FREE: Testosterone-% Free: 1.4 % — ABNORMAL LOW (ref 0.2–0.7)

## 2014-10-25 ENCOUNTER — Telehealth: Payer: Self-pay | Admitting: Family Medicine

## 2014-10-26 NOTE — Telephone Encounter (Signed)
This is not our patient.

## 2014-10-28 NOTE — Telephone Encounter (Signed)
I have submitted a ticket to [facility]

## 2014-10-31 DIAGNOSIS — G8929 Other chronic pain: Secondary | ICD-10-CM | POA: Insufficient documentation

## 2014-10-31 DIAGNOSIS — R51 Headache: Secondary | ICD-10-CM | POA: Insufficient documentation

## 2014-10-31 DIAGNOSIS — I10 Essential (primary) hypertension: Secondary | ICD-10-CM | POA: Insufficient documentation

## 2014-10-31 DIAGNOSIS — Z720 Tobacco use: Secondary | ICD-10-CM | POA: Insufficient documentation

## 2014-10-31 DIAGNOSIS — R102 Pelvic and perineal pain: Secondary | ICD-10-CM | POA: Insufficient documentation

## 2014-10-31 DIAGNOSIS — Z79899 Other long term (current) drug therapy: Secondary | ICD-10-CM | POA: Insufficient documentation

## 2014-10-31 DIAGNOSIS — K5900 Constipation, unspecified: Secondary | ICD-10-CM | POA: Insufficient documentation

## 2014-10-31 NOTE — ED Notes (Signed)
Pt presents to ER stating he has been having bloating after meals. Pt states "when I take a laxative only the laxative comes out." Pt states "the pain is in my bone." Pt talking and then pausing for long periods of time, looking around room. NAD.

## 2014-11-01 ENCOUNTER — Emergency Department
Admission: EM | Admit: 2014-11-01 | Discharge: 2014-11-01 | Disposition: A | Discharge status: Home / Self Care | Payer: Self-pay | Attending: Student | Admitting: Student

## 2014-11-01 ENCOUNTER — Emergency Department: Payer: Self-pay

## 2014-11-01 DIAGNOSIS — G8929 Other chronic pain: Secondary | ICD-10-CM

## 2014-11-01 DIAGNOSIS — N509 Disorder of male genital organs, unspecified: Secondary | ICD-10-CM

## 2014-11-01 DIAGNOSIS — K5900 Constipation, unspecified: Secondary | ICD-10-CM

## 2014-11-01 DIAGNOSIS — R140 Abdominal distension (gaseous): Secondary | ICD-10-CM

## 2014-11-01 LAB — CBC WITH DIFFERENTIAL/PLATELET
Basophils Absolute: 0.1 10*3/uL (ref 0–0.1)
Basophils Relative: 2 %
Eosinophils Absolute: 0.2 10*3/uL (ref 0–0.7)
Eosinophils Relative: 2 %
HCT: 40.6 % (ref 40.0–52.0)
Hemoglobin: 14.1 g/dL (ref 13.0–18.0)
LYMPHS ABS: 3.3 10*3/uL (ref 1.0–3.6)
LYMPHS PCT: 41 %
MCH: 30.3 pg (ref 26.0–34.0)
MCHC: 34.8 g/dL (ref 32.0–36.0)
MCV: 87.2 fL (ref 80.0–100.0)
MONO ABS: 0.7 10*3/uL (ref 0.2–1.0)
MONOS PCT: 9 %
NEUTROS ABS: 3.8 10*3/uL (ref 1.4–6.5)
NEUTROS PCT: 46 %
Platelets: 412 10*3/uL (ref 150–440)
RBC: 4.66 MIL/uL (ref 4.40–5.90)
RDW: 12.8 % (ref 11.5–14.5)
WBC: 8.1 10*3/uL (ref 3.8–10.6)

## 2014-11-01 LAB — COMPREHENSIVE METABOLIC PANEL
ALT: 19 U/L (ref 17–63)
AST: 22 U/L (ref 15–41)
Albumin: 4.4 g/dL (ref 3.5–5.0)
Alkaline Phosphatase: 69 U/L (ref 38–126)
Anion gap: 9 (ref 5–15)
BILIRUBIN TOTAL: 0.6 mg/dL (ref 0.3–1.2)
BUN: 10 mg/dL (ref 6–20)
CALCIUM: 9.4 mg/dL (ref 8.9–10.3)
CHLORIDE: 105 mmol/L (ref 101–111)
CO2: 24 mmol/L (ref 22–32)
Creatinine, Ser: 0.8 mg/dL (ref 0.61–1.24)
GFR calc Af Amer: >60 mL/min (ref >60)
GFR calc non Af Amer: >60 mL/min (ref >60)
GLUCOSE: 107 mg/dL — AB (ref 65–99)
Potassium: 3.7 mmol/L (ref 3.5–5.1)
Sodium: 138 mmol/L (ref 135–145)
TOTAL PROTEIN: 7.3 g/dL (ref 6.5–8.1)

## 2014-11-01 LAB — URINALYSIS COMPLETE WITH MICROSCOPIC (ARMC ONLY)
BACTERIA UA: NONE SEEN
BILIRUBIN URINE: NEGATIVE
Glucose, UA: NEGATIVE mg/dL
Hgb urine dipstick: NEGATIVE
Ketones, ur: NEGATIVE mg/dL
Leukocytes, UA: NEGATIVE
Nitrite: NEGATIVE
PROTEIN: NEGATIVE mg/dL
SPECIFIC GRAVITY, URINE: 1.006 (ref 1.005–1.030)
pH: 7 (ref 5.0–8.0)

## 2014-11-01 NOTE — ED Notes (Signed)
MD at bedside to assess pt. RN present. Pt reports symptoms for several months. Notified MD that he is "hurting to bone" in groin area. After assessment, pt informed by MD that he will be given instructions to f/u with primary care MD. Pt states, "But can you tell me why I am dying?" MD informed pt that all test results are normal and that pt should f/u with primary care MD for further evaluation. Pt states, "But I cannot concentrate. I either have cancer or a bowel stroke". MD again reinforced that test results are normal and pt is to f/u with primary care MD. Pt in no acute distress.

## 2014-11-01 NOTE — ED Notes (Signed)
Pt returned from xray. Per xray staff, pt refused xray, stating he has already had too many xrays. MD notified.

## 2014-11-01 NOTE — ED Notes (Signed)
Patient transported to X-ray 

## 2014-11-01 NOTE — ED Notes (Signed)
After protocols completed, RN had to repeatedly ask pt to go back into the lobby. Pt continued to sit in bench and state his bottom hurt too bad to go back to the lobby. RN gave pt multiple warm blankets to sit on and for comfort. RN again had to ask pt multiple times to leave the triage room. Pt in NAD.

## 2014-11-01 NOTE — ED Provider Notes (Signed)
[facility] Emergency Department Provider Note  ____________________________________________  Time seen: Approximately 3:56 AM  I have reviewed the triage vital signs and the nursing notes.   HISTORY  Chief Complaint Bloated    HPI DMARIUS REEDER is a 49 y.o. male with history of depression, bipolar disorder, personality disorder, gastroparesis, hypertension, severe insomnia, who presents to the ER because he "wants to know why I'm dying". He has multiple chronic complaints. For one, he feels "bloated" after eating. He reports that he is not able to have a bowel movement without a laxative. His last movement bowel movement occurred yesterday, he continues to pass flatus. No blood in his stools. Additionally he is having pain in the area between his scrotum and his rectum that "goes down to the bone". This has been ongoing for 3 months and is constant. Additionally he is concerned that he is having "bowel strokes". He is worried that he is "dying of cancer" though he has never been diagnosed with cancer. He has chronic headaches. He has had poor concentration for several months. He has not been able to drive for several months. Symptoms are chronic for several months. Current severity is moderate for all of them. No modifying factors.   Past Medical History  Diagnosis Date  . Hypertension   . Rectal fistula   . Prostatitis   . Diverticulosis   . Gastroparesis   . Bipolar disorder   . Borderline personality disorder   . Major depression     Patient Active Problem List   Diagnosis Date Noted  . 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 H/O autism spectrum disorder 10/20/2014  . MDD (major depressive disorder), recurrent episode, with atypical features 10/19/2014  . GAD (generalized anxiety disorder) 10/19/2014  . Tooth ache 10/19/2014  . Suicidal ideation     Past Surgical History  Procedure Laterality Date  . Lumbar spine surgery    .  Colonoscopy    . Colonoscopy with propofol N/A 10/12/2014    Procedure: COLONOSCOPY WITH PROPOFOL;  Surgeon: Hulen Luster, MD;  Location: [facility] ENDOSCOPY;  Service: Gastroenterology;  Laterality: N/A;    Current Outpatient Rx  Name  Route  Sig  Dispense  Refill  . ALPRAZolam (XANAX) 1 MG tablet   Oral   Take 1 tablet (1 mg total) by mouth 3 (three) times daily as needed for anxiety.   30 tablet   0   . amLODipine (NORVASC) 5 MG tablet   Oral   Take 1 tablet (5 mg total) by mouth daily.   30 tablet   0   . bisacodyl (DULCOLAX) 5 MG EC tablet   Oral   Take 5 mg by mouth daily as needed for mild constipation or moderate constipation (constipation).          . calcium carbonate (TUMS EX) 750 MG chewable tablet   Oral   Chew 1 tablet by mouth daily as needed for heartburn (heartburn).         . cloNIDine (CATAPRES) 0.2 MG tablet   Oral   Take 1 tablet (0.2 mg total) by mouth at bedtime. Patient taking differently: Take 0.2 mg by mouth 2 (two) times daily.    30 tablet   0   . divalproex (DEPAKOTE ER) 500 MG 24 hr tablet   Oral   Take 1 tablet (500 mg total) by mouth at bedtime.   30 tablet   0   . gabapentin (NEURONTIN) 100 MG capsule  Oral   Take 2 capsules (200 mg total) by mouth 3 (three) times daily.   180 capsule   0   . gabapentin (NEURONTIN) 300 MG capsule   Oral   Take 1 capsule (300 mg total) by mouth at bedtime.   30 capsule   0   . hydrOXYzine (ATARAX/VISTARIL) 50 MG tablet   Oral   Take 2 tablets (100 mg total) by mouth at bedtime as needed (sleep).   60 tablet   0   . magnesium hydroxide (MILK OF MAGNESIA) 400 MG/5ML suspension   Oral   Take 30 mLs by mouth daily as needed for mild constipation.         . metoprolol succinate (TOPROL-XL) 100 MG 24 hr tablet   Oral   Take 1 tablet (100 mg total) by mouth daily. Take with or immediately following a meal.   30 tablet   0   . mirtazapine (REMERON) 45 MG tablet   Oral   Take 1 tablet (45 mg  total) by mouth at bedtime.   30 tablet   0   . polyethylene glycol (MIRALAX / GLYCOLAX) packet   Oral   Take 17 g by mouth daily as needed for mild constipation (constipation).         Marland Kitchen zolpidem (AMBIEN) 10 MG tablet   Oral   Take 1 tablet (10 mg total) by mouth at bedtime.   30 tablet   0   . amoxicillin (AMOXIL) 500 MG capsule   Oral   Take 1 capsule (500 mg total) by mouth 2 (two) times daily. Patient not taking: Reported on 11/01/2014   8 capsule   0   . cloNIDine (CATAPRES) 0.2 MG tablet   Oral   Take 1 tablet (0.2 mg total) by mouth daily.   30 tablet   0   . nicotine (NICODERM CQ - DOSED IN MG/24 HOURS) 21 mg/24hr patch   Transdermal   Place 1 patch (21 mg total) onto the skin daily.   28 patch   0   . testosterone (ANDROGEL) 50 MG/5GM (1%) GEL   Transdermal   Place 5 g onto the skin daily.   1 Tube   0     Pt to followup with [facility] for hypog ...     Allergies Ciprofloxacin; Milk-related compounds; Pepto-bismol; Trazodone and nefazodone; and Sulfur  No family history on file.  Social History History  Substance Use Topics  . Smoking status: Light Tobacco Smoker  . Smokeless tobacco: Not on file  . Alcohol Use: No     Comment: every day or every other day (2-4  12oz cans)    Review of Systems Constitutional: No fever/chills Eyes: No visual changes. ENT: No sore throat. Cardiovascular: Denies chest pain. Respiratory: Denies shortness of breath. Gastrointestinal: + abdominal bloating.  No nausea, no vomiting.  No diarrhea.  + constipation. Genitourinary: Negative for dysuria. Musculoskeletal: Negative for back pain. Skin: Negative for rash. Neurological: Positive for headaches, negative for focal weakness or numbness.  10-point ROS otherwise negative.  ____________________________________________   PHYSICAL EXAM:  VITAL SIGNS: ED Triage Vitals  Enc Vitals Group     BP 10/31/14 2351 116/84 mmHg     Pulse Rate 10/31/14  2351 55     Resp 10/31/14 2351 20     Temp 10/31/14 2351 98 F (36.7 C)     Temp Source 10/31/14 2351 Oral     SpO2 10/31/14 2351 96 %  Weight 10/31/14 2351 200 lb (90.719 kg)     Height 10/31/14 2351 5\' 7"  (1.702 m)     Head Cir --      Peak Flow --      Pain Score 10/31/14 2352 10     Pain Loc --      Pain Edu? --      Excl. in [place]? --     Constitutional: Alert and oriented. Anxious- appearing but in no acute distress. Eyes: Conjunctivae are normal. PERRL. EOMI. Head: Atraumatic. Nose: No congestion/rhinnorhea. Mouth/Throat: Mucous membranes are moist.  Oropharynx non-erythematous. Neck: No stridor.   Cardiovascular: Normal rate, regular rhythm. Grossly normal heart sounds.  Good peripheral circulation. Respiratory: Normal respiratory effort.  No retractions. Lungs CTAB. Gastrointestinal: Soft and nontender. No distention. No abdominal bruits. No CVA tenderness. Genitourinary: Penis and descended testicles without edema, erythema, tenderness. Musculoskeletal: No lower extremity tenderness nor edema.  No joint effusions. Neurologic:  Normal speech and language. No gross focal neurologic deficits are appreciated. Speech is normal. No gait instability. Skin:  Skin is warm, dry and intact. No rash noted. Psychiatric: Mood and affect are normal. Speech and behavior are normal.  ____________________________________________   LABS (all labs ordered are listed, but only abnormal results are displayed)  Labs Reviewed  COMPREHENSIVE METABOLIC PANEL - Abnormal; Notable for the following:    Glucose, Bld 107 (*)    All other components within normal limits  URINALYSIS COMPLETEWITH MICROSCOPIC ([facility] ONLY) - Abnormal; Notable for the following:    Color, Urine STRAW (*)    APPearance CLEAR (*)    Squamous Epithelial / LPF 0-5 (*)    All other components within normal limits  CBC WITH DIFFERENTIAL/PLATELET   ____________________________________________  EKG  ED ECG REPORT I,  Joanne Gavel, the attending physician, personally viewed and interpreted this ECG.   Date: 11/01/2014  EKG Time: 23:59  Rate: 53  Rhythm: sinus bradycardia  Axis: Normal  Intervals:none  ST&T Change: No acute ST segment elevation  ____________________________________________  RADIOLOGY  none ____________________________________________   PROCEDURES  Procedure(s) performed: None  Critical Care performed: No  ____________________________________________   INITIAL IMPRESSION / ASSESSMENT AND PLAN / ED COURSE  Pertinent labs & imaging results that were available during my care of the patient were reviewed by me and considered in my medical decision making (see chart for details).   ----------------------------------------- 4:34 AM on 11/01/2014 -----------------------------------------  Orbie Pyo is a 49 y.o. male with history of depression, bipolar disorder, personality disorder, gastroparesis, hypertension, severe insomnia, who presents to the ER because he "wants to know why I'm dying". He is complaining of poor concentration, concerned that he had a bowel stroke 3 months ago, perineal pain "down to the bone" which has been constant for 3 months. He has pain when he pushes on his perineal area but no pain or tenderness or abnormality when I push. The area in question is far from his rectum and I doubt perirectal abscess. On exam, he is anxious-appearing. Mildly bradycardic which he has been in the past but otherwise, vital signs are stable, he is afebrile. He has a completely benign physical examination. I had an extensive conversation with the patient regarding his chronic medical problems and need for close PCP follow-up. He is perseverating on the fact that he is dying. I attempted to reassure him but he is not reassured. On chart review, he has seen multiple medical providers with physical complaints, however no one has really been able to  discover an organic etiology  of these complaints.  I had offered an x-ray of his abdomen to eval stool burden however he declined this because of his concern for radiation exposure and his report that he has had multiple x-rays in the past. He has no SI, no HI, no audiovisual hallucination. I've encouraged him to follow up with his PCP. DC with return precautions. ____________________________________________   FINAL CLINICAL IMPRESSION(S) / ED DIAGNOSES  Final diagnoses:  Bloating  Constipation, unspecified constipation type  Perineal pain in male, chronic      Joanne Gavel, MD 11/01/14 [phone]

## 2014-11-10 ENCOUNTER — Emergency Department: Payer: Self-pay

## 2014-11-10 ENCOUNTER — Encounter: Payer: Self-pay | Admitting: Emergency Medicine

## 2014-11-10 ENCOUNTER — Emergency Department
Admission: EM | Admit: 2014-11-10 | Discharge: 2014-11-10 | Disposition: A | Discharge status: Home / Self Care | Payer: Self-pay | Attending: Emergency Medicine | Admitting: Emergency Medicine

## 2014-11-10 DIAGNOSIS — K5900 Constipation, unspecified: Secondary | ICD-10-CM | POA: Insufficient documentation

## 2014-11-10 DIAGNOSIS — F603 Borderline personality disorder: Secondary | ICD-10-CM | POA: Insufficient documentation

## 2014-11-10 DIAGNOSIS — I10 Essential (primary) hypertension: Secondary | ICD-10-CM | POA: Insufficient documentation

## 2014-11-10 DIAGNOSIS — K08109 Complete loss of teeth, unspecified cause, unspecified class: Secondary | ICD-10-CM | POA: Insufficient documentation

## 2014-11-10 DIAGNOSIS — K029 Dental caries, unspecified: Secondary | ICD-10-CM | POA: Insufficient documentation

## 2014-11-10 DIAGNOSIS — Z79899 Other long term (current) drug therapy: Secondary | ICD-10-CM | POA: Insufficient documentation

## 2014-11-10 DIAGNOSIS — F319 Bipolar disorder, unspecified: Secondary | ICD-10-CM | POA: Insufficient documentation

## 2014-11-10 LAB — BASIC METABOLIC PANEL
Anion gap: 10 (ref 5–15)
BUN: 8 mg/dL (ref 6–20)
CO2: 25 mmol/L (ref 22–32)
Calcium: 9.7 mg/dL (ref 8.9–10.3)
Chloride: 100 mmol/L — ABNORMAL LOW (ref 101–111)
Creatinine, Ser: 0.94 mg/dL (ref 0.61–1.24)
Glucose, Bld: 144 mg/dL — ABNORMAL HIGH (ref 65–99)
POTASSIUM: 3.6 mmol/L (ref 3.5–5.1)
Sodium: 135 mmol/L (ref 135–145)

## 2014-11-10 LAB — CBC
HCT: 38.5 % — ABNORMAL LOW (ref 40.0–52.0)
HEMOGLOBIN: 13.5 g/dL (ref 13.0–18.0)
MCH: 30.3 pg (ref 26.0–34.0)
MCHC: 35 g/dL (ref 32.0–36.0)
MCV: 86.5 fL (ref 80.0–100.0)
Platelets: 396 10*3/uL (ref 150–440)
RBC: 4.45 MIL/uL (ref 4.40–5.90)
RDW: 12.7 % (ref 11.5–14.5)
WBC: 6.8 10*3/uL (ref 3.8–10.6)

## 2014-11-10 MED ORDER — PENICILLIN V POTASSIUM 500 MG PO TABS: 500.0000 mg | ORAL_TABLET | Freq: Four times a day (QID) | ORAL | Status: DC

## 2014-11-10 NOTE — ED Notes (Signed)
After triage, pt states he stopped taking his psychiatric meds several months ago, however after further investigation he stated he was taking meds,  Pt confused when speaking, states " I am not the man I used to be"  Also reports "if you can not figure out why I am weak I might want to hurt myself, but I don't want to say that because I "  Pt vague when speaking, reports psychiatric medications make him feel worse than today so that is why he stopped taking them.  Then states he is still taking them but that he was referring to trazadone.  Requesting to be checked for "AIDS" and "cancer".  When asked why he wants to be tested, states " because that is why I could be weak, I might have AIDS."  Pt also reports abscessed tooth in right side of mouth, reports "I need some abx for that, that doctor should have given me some"  Charge nurse made aware of pt.

## 2014-11-10 NOTE — ED Notes (Signed)
Pt to ed with c/o generalized weakness, "white tongue", nausea and pain after eating.  Pt also reports difficulty sleeping and not resting at night.

## 2014-11-10 NOTE — Discharge Instructions (Signed)
You were seen in the emergency department today for abdominal discomfort that we believe is a result of constipation.  We recommend that you use one or more of the following over-the-counter medications in the order described:   1)  Colace (or Dulcolax) 100 mg:  This is a stool softener, and you may take it once or twice a day as needed. 2)  Senna tablets:  This is a bowel stimulant that will help "push" out your stool. It is the next step to add after you have tried a stool softener. 3)  Miralax (powder):  This medication works by drawing additional fluid into your intestines and helps to flush out your stool.  Mix the powder with water or juice according to label instructions.  It may help if the Colace and Senna are not sufficient, but you must be sure to use the recommended amount of water or juice when you mix up the powder. Remember that narcotic pain medications are constipating, so avoid them or minimize their use.  Drink plenty of fluids.  Please return to the Emergency Department immediately if you develop new or worsening symptoms that concern you, such as (but not limited to) fever > 101 degrees, severe abdominal pain, or persistent vomiting.  In the meantime, we also recommend he follow up with both Dr. Allen Norris and your primary care provider for further recommendations and managing your symptoms.  We also provided a prescription for penicillin for your possible dental infection.  It is important that you follow-up with your dentist at the next available opportunity for additional evaluation.  Constipation Constipation is when a person:  Poops (has a bowel movement) less than 3 times a week.  Has a hard time pooping.  Has poop that is dry, hard, or bigger than normal. HOME CARE   Eat foods with a lot of fiber in them. This includes fruits, vegetables, beans, and whole grains such as brown rice.  Avoid fatty foods and foods with a lot of sugar. This includes french fries, hamburgers,  cookies, candy, and soda.  If you are not getting enough fiber from food, take products with added fiber in them (supplements).  Drink enough fluid to keep your pee (urine) clear or pale yellow.  Exercise on a regular basis, or as told by your doctor.  Go to the restroom when you feel like you need to poop. Do not hold it.  Only take medicine as told by your doctor. Do not take medicines that help you poop (laxatives) without talking to your doctor first. GET HELP RIGHT AWAY IF:   You have bright red blood in your poop (stool).  Your constipation lasts more than 4 days or gets worse.  You have belly (abdominal) or butt (rectal) pain.  You have thin poop (as thin as a pencil).  You lose weight, and it cannot be explained. MAKE SURE YOU:   Understand these instructions.  Will watch your condition.  Will get help right away if you are not doing well or get worse. Document Released: 10/25/2007 Document Revised: 05/13/2013 Document Reviewed: 02/17/2013 [facility] Patient Information 2015 [place], [place]. This information is not intended to replace advice given to you by your health care provider. Make sure you discuss any questions you have with your health care provider.

## 2014-11-10 NOTE — ED Provider Notes (Signed)
[facility] Emergency Department Provider Note  ____________________________________________  Time seen: Approximately 4:53 PM  I have reviewed the triage vital signs and the nursing notes.   HISTORY  Chief Complaint Weakness    HPI TRUE Jeremiah [name] is a 49 y.o. male with an extensive history of psychiatric illness that includes anxiolytics dependence, autism spectrum disorder, major depressive disorder, generalized anxiety disorder, borderline personality disorder, and bipolar disorder, as well as a past medical history that may include gastroparesis and diverticulosis who presents complaining of weakness that has been worsening over several months and an inability to eat.  He has been seen in this emergency department several times for similar symptoms, has been evaluated as an outpatient by Dr. Candace Cruise with gastroenterology, and has been at [facility] for both medical and psychiatric reasons recently.  He reports that his symptoms are worse after he eats and that he is not able to have a normal bowel movement without taking a laxity of.  He continues to pass gas but reports that "sometimes just the milk of magnesia comes out "when he has a bowel movement.  He is concerned about his heart rate, his blood pressure, and states "I mean, I know I am dying".  He denies any abdominal pain and states it is mostly just problem after he eats.  He also states that he recently had a tooth pulled and is having some foul tasting drainage from that tooth and is convinced that he has an infection.     Past Medical History  Diagnosis Date  . Hypertension   . Rectal fistula   . Prostatitis   . Diverticulosis   . Gastroparesis   . Bipolar disorder   . Borderline personality disorder   . Major depression     Patient Active Problem List   Diagnosis Date Noted  . 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 H/O autism spectrum disorder 10/20/2014  . MDD  (major depressive disorder), recurrent episode, with atypical features 10/19/2014  . GAD (generalized anxiety disorder) 10/19/2014  . Tooth ache 10/19/2014  . Suicidal ideation     Past Surgical History  Procedure Laterality Date  . Lumbar spine surgery    . Colonoscopy    . Colonoscopy with propofol N/A 10/12/2014    Procedure: COLONOSCOPY WITH PROPOFOL;  Surgeon: Hulen Luster, MD;  Location: [facility] ENDOSCOPY;  Service: Gastroenterology;  Laterality: N/A;    Current Outpatient Rx  Name  Route  Sig  Dispense  Refill  . ALPRAZolam (XANAX) 1 MG tablet   Oral   Take 1 tablet (1 mg total) by mouth 3 (three) times daily as needed for anxiety.   30 tablet   0   . amLODipine (NORVASC) 5 MG tablet   Oral   Take 1 tablet (5 mg total) by mouth daily.   30 tablet   0   . amoxicillin (AMOXIL) 500 MG capsule   Oral   Take 1 capsule (500 mg total) by mouth 2 (two) times daily. Patient not taking: Reported on 11/01/2014   8 capsule   0   . bisacodyl (DULCOLAX) 5 MG EC tablet   Oral   Take 5 mg by mouth daily as needed for mild constipation or moderate constipation (constipation).          . calcium carbonate (TUMS EX) 750 MG chewable tablet   Oral   Chew 1 tablet by mouth daily as needed for heartburn (heartburn).         Marland Kitchen  cloNIDine (CATAPRES) 0.2 MG tablet   Oral   Take 1 tablet (0.2 mg total) by mouth at bedtime. Patient taking differently: Take 0.2 mg by mouth 2 (two) times daily.    30 tablet   0   . cloNIDine (CATAPRES) 0.2 MG tablet   Oral   Take 1 tablet (0.2 mg total) by mouth daily.   30 tablet   0   . divalproex (DEPAKOTE ER) 500 MG 24 hr tablet   Oral   Take 1 tablet (500 mg total) by mouth at bedtime.   30 tablet   0   . gabapentin (NEURONTIN) 100 MG capsule   Oral   Take 2 capsules (200 mg total) by mouth 3 (three) times daily.   180 capsule   0   . gabapentin (NEURONTIN) 300 MG capsule   Oral   Take 1 capsule (300 mg total) by mouth at bedtime.   30  capsule   0   . hydrOXYzine (ATARAX/VISTARIL) 50 MG tablet   Oral   Take 2 tablets (100 mg total) by mouth at bedtime as needed (sleep).   60 tablet   0   . magnesium hydroxide (MILK OF MAGNESIA) 400 MG/5ML suspension   Oral   Take 30 mLs by mouth daily as needed for mild constipation.         . metoprolol succinate (TOPROL-XL) 100 MG 24 hr tablet   Oral   Take 1 tablet (100 mg total) by mouth daily. Take with or immediately following a meal.   30 tablet   0   . mirtazapine (REMERON) 45 MG tablet   Oral   Take 1 tablet (45 mg total) by mouth at bedtime.   30 tablet   0   . nicotine (NICODERM CQ - DOSED IN MG/24 HOURS) 21 mg/24hr patch   Transdermal   Place 1 patch (21 mg total) onto the skin daily.   28 patch   0   . penicillin v potassium (VEETID) 500 MG tablet   Oral   Take 1 tablet (500 mg total) by mouth 4 (four) times daily.   40 tablet   0   . polyethylene glycol (MIRALAX / GLYCOLAX) packet   Oral   Take 17 g by mouth daily as needed for mild constipation (constipation).         Marland Kitchen testosterone (ANDROGEL) 50 MG/5GM (1%) GEL   Transdermal   Place 5 g onto the skin daily.   1 Tube   0     Pt to followup with [facility] for hypog ...   . zolpidem (AMBIEN) 10 MG tablet   Oral   Take 1 tablet (10 mg total) by mouth at bedtime.   30 tablet   0     Allergies Ciprofloxacin; Milk-related compounds; Pepto-bismol; Trazodone and nefazodone; and Sulfur  Family History  Problem Relation Age of Onset  . Stroke Mother     Social History History  Substance Use Topics  . Smoking status: Never Smoker   . Smokeless tobacco: Not on file  . Alcohol Use: No     Comment: every day or every other day (2-4  12oz cans)    Review of Systems Constitutional: No fever/chills Eyes: No visual changes. ENT: No sore throat. Cardiovascular: Denies chest pain. Respiratory: Denies shortness of breath. Gastrointestinal: No abdominal pain.  +Constipation.   States "I can't eat" Genitourinary: Negative for dysuria. Musculoskeletal: Negative for back pain. Skin: Negative for rash. Neurological: Negative for  headaches, focal weakness or numbness.  Endorses generalized weakness  10-point ROS otherwise negative.  ____________________________________________   PHYSICAL EXAM:  ED Triage Vitals  Enc Vitals Group     BP 11/10/14 1541 110/84 mmHg     Pulse Rate 11/10/14 1541 48     Resp 11/10/14 1541 10     Temp --      Temp src --      SpO2 11/10/14 1541 100 %     Weight --      Height --      Head Cir --      Peak Flow --      Pain Score 11/10/14 1241 8     Pain Loc --      Pain Edu? --      Excl. in [place]? --       Constitutional: Alert and oriented but anxious.  Mildly disheveled.  No acute distress Eyes: Conjunctivae are normal. PERRL. EOMI. Head: Atraumatic. Nose: No congestion/rhinnorhea. Mouth/Throat: Mucous membranes are moist.  Oropharynx non-erythematous.  Easily scraped white plaque on tongue.  Tooth #6 is missing and there is minimal surrounding erythema, no swelling, no fluctuance, possibly some minor discharge from the socket which the patient states is "draining".  However, he has no evidence of ANUG, dry socket, Ludwig's, nor acute intraoral abscess/infection.  He has poor dentition and chronic caries. Neck: No stridor.   Cardiovascular: Normal rate, regular rhythm. Grossly normal heart sounds.  Good peripheral circulation. Respiratory: Normal respiratory effort.  No retractions. Lungs CTAB. Gastrointestinal: Soft and nontender. No distention. No abdominal bruits. No CVA tenderness. Musculoskeletal: No lower extremity tenderness nor edema.  No joint effusions. Neurologic:  Normal speech and language. No gross focal neurologic deficits are appreciated. Speech is normal. Skin:  Skin is warm, dry and intact. No rash noted.   ____________________________________________   LABS (all labs ordered are listed, but only  abnormal results are displayed)  Labs Reviewed  CBC - Abnormal; Notable for the following:    HCT 38.5 (*)    All other components within normal limits  BASIC METABOLIC PANEL - Abnormal; Notable for the following:    Chloride 100 (*)    Glucose, Bld 144 (*)    All other components within normal limits   ____________________________________________  EKG  ED ECG REPORT I, Ryken Paschal, the attending physician, personally viewed and interpreted this ECG.  Date: 11/10/2014 EKG Time: 13:05 Rate: 64 Rhythm: normal sinus rhythm QRS Axis: normal Intervals: normal ST/T Wave abnormalities: normal Conduction Disutrbances: none Narrative Interpretation: unremarkable  ____________________________________________  RADIOLOGY  I personally viewed and evaluated these images as part of my medical decision making.   Dg Chest 2 View  10/15/2014   CLINICAL DATA:  Cough.  EXAM: CHEST  2 VIEW  COMPARISON:  09/11/2014  FINDINGS: The cardiomediastinal silhouette is unremarkable.  There is no evidence of focal airspace disease, pulmonary edema, suspicious pulmonary nodule/mass, pleural effusion, or pneumothorax. No acute bony abnormalities are identified.  IMPRESSION: No active cardiopulmonary disease.   Electronically Signed   By: Margarette Canada M.D.   On: 10/15/2014 19:50   Dg Abd 2 Views  11/10/2014   CLINICAL DATA:  Three-month history of Abdominal pain and constipation.  EXAM: ABDOMEN - 2 VIEW  COMPARISON:  CT scan 10/02/2014  FINDINGS: The lung bases are clear. There is moderate air and stool throughout the colon and some air-filled loops of small bowel. No findings for obstruction an or perforation. The soft tissue shadows  are maintained. No worrisome calcifications. The bony structures are unremarkable.  IMPRESSION: Unremarkable bowel gas pattern. No findings for obstruction or perforation.   Electronically Signed   By: Marijo Sanes M.D.   On: 11/10/2014 17:56     ____________________________________________   PROCEDURES  Procedure(s) performed: None  Critical Care performed: No ____________________________________________   INITIAL IMPRESSION / ASSESSMENT AND PLAN / ED COURSE  Pertinent labs & imaging results that were available during my care of the patient were reviewed by me and considered in my medical decision making (see chart for details).   The patient has no evidence of an emergent medical issue today.  He has normal vitals, unremarkable labs, and non-emergent abdominal imaging notable only for a moderate stool burden.  He has no tenderness to palpation.  I believe his psychiatric illness is the largest contributor to his symptoms, though he may have some medication-related constipation as well.  I gave him my usual and customary constipation instructions and return precautions.  I also provided him with a prescription for PenVK for his dental caries and recommended close follow up with his dentist.  ____________________________________________  FINAL CLINICAL IMPRESSION(S) / ED DIAGNOSES  Final diagnoses:  Constipation  Dental caries      NEW MEDICATIONS STARTED DURING THIS VISIT:  Discharge Medication List as of 11/10/2014  6:12 PM    START taking these medications   Details  penicillin v potassium (VEETID) 500 MG tablet Take 1 tablet (500 mg total) by mouth 4 (four) times daily., Starting 11/10/2014, Until Discontinued, Print         Hinda Kehr, MD 11/11/14 [phone]

## 2014-11-10 NOTE — ED Notes (Signed)
Pt is unable to state the month or president.

## 2014-11-13 ENCOUNTER — Other Ambulatory Visit: Payer: Self-pay | Admitting: *Deleted

## 2014-11-13 ENCOUNTER — Encounter: Payer: Self-pay | Admitting: *Deleted

## 2014-11-17 ENCOUNTER — Ambulatory Visit: Payer: Self-pay | Admitting: Urology

## 2014-11-17 ENCOUNTER — Encounter: Payer: Self-pay | Admitting: Urology

## 2014-11-19 ENCOUNTER — Telehealth: Payer: Self-pay | Admitting: Emergency Medicine

## 2014-11-19 ENCOUNTER — Ambulatory Visit (INDEPENDENT_AMBULATORY_CARE_PROVIDER_SITE_OTHER): Payer: Self-pay | Admitting: Urology

## 2014-11-19 ENCOUNTER — Encounter: Payer: Self-pay | Admitting: Urology

## 2014-11-19 VITALS — BP 116/80 | HR 53 | Resp 18 | Ht 67.0 in | Wt 167.9 lb

## 2014-11-19 DIAGNOSIS — N5319 Other ejaculatory dysfunction: Secondary | ICD-10-CM

## 2014-11-19 DIAGNOSIS — R312 Other microscopic hematuria: Secondary | ICD-10-CM

## 2014-11-19 DIAGNOSIS — N528 Other male erectile dysfunction: Secondary | ICD-10-CM

## 2014-11-19 DIAGNOSIS — N508 Other specified disorders of male genital organs: Secondary | ICD-10-CM

## 2014-11-19 DIAGNOSIS — R4689 Other symptoms and signs involving appearance and behavior: Secondary | ICD-10-CM

## 2014-11-19 DIAGNOSIS — N529 Male erectile dysfunction, unspecified: Secondary | ICD-10-CM

## 2014-11-19 DIAGNOSIS — R4189 Other symptoms and signs involving cognitive functions and awareness: Secondary | ICD-10-CM

## 2014-11-19 DIAGNOSIS — N411 Chronic prostatitis: Secondary | ICD-10-CM

## 2014-11-19 DIAGNOSIS — F919 Conduct disorder, unspecified: Secondary | ICD-10-CM

## 2014-11-19 DIAGNOSIS — R3129 Other microscopic hematuria: Secondary | ICD-10-CM

## 2014-11-19 LAB — URINALYSIS, COMPLETE
Bilirubin, UA: NEGATIVE
GLUCOSE, UA: NEGATIVE
KETONES UA: NEGATIVE
Leukocytes, UA: NEGATIVE
Nitrite, UA: NEGATIVE
PH UA: 7 (ref 5.0–7.5)
PROTEIN UA: NEGATIVE
Specific Gravity, UA: 1.015 (ref 1.005–1.030)
Urobilinogen, Ur: 0.2 mg/dL (ref 0.2–1.0)

## 2014-11-19 LAB — MICROSCOPIC EXAMINATION

## 2014-11-19 LAB — BLADDER SCAN AMB NON-IMAGING

## 2014-11-19 NOTE — Progress Notes (Signed)
11/19/2014 2:26 PM   Jeremiah [name] [birth year] [id number]  Referring provider: Steele Sizer, MD [address] [place], [place] [address]  Chief Complaint  Patient presents with  . Prostatitis  . Follow-up    HPI: [name] is a 48 year old white male with a history of chronic prostatitis who presents today complaining of ejaculatory disorders.  He states he has the feelings of climax but nothing comes out. This is very disturbing to him. He believes this started after he had an episode of malignant hypertension several months ago.  Patient's prostatitis has been treated with antibiotics in the past, but he would experience gastrointestinal side effects and would not continue the medication.  He was also referred for physical therapy. But because he is self-pay, he could not undergo the therapy.  Patient also suffers from schizophrenia and bipolar disease making his treatment of chronic prostatitis more difficult.  He denies any curvature of penis with erections or penile pain with erections.  He is experiencing ED with difficulty maintaining erections.   His IPSS score is 12/2, which is moderate LUTS.  His PVR is 49 mL.    He is also concerned about his recent decline in cognitive function. He states his memory is waning and he attributes this also to the episode of malignant hypertension suffered several months ago. He is requesting a referral to neurology.   PMH: Past Medical History  Diagnosis Date  . Hypertension   . Rectal fistula   . Dysuria   . Diverticulosis   . Gastroparesis   . Borderline personality disorder   . Major depression   . Schizophrenia   . Hypokalemia   . Microscopic hematuria   . External hemorrhoid   . Over weight   . Nicotine addiction   . Difficulty urinating     Surgical History: Past Surgical History  Procedure Laterality Date  . Lumbar spine surgery    . Colonoscopy    . Colonoscopy with propofol N/A 10/12/2014   Procedure: COLONOSCOPY WITH PROPOFOL;  Surgeon: Hulen Luster, MD;  Location: [facility] ENDOSCOPY;  Service: Gastroenterology;  Laterality: N/A;  . Tonsillectomy      Home Medications:    Medication List       This list is accurate as of: 11/19/14  2:26 PM.  Always use your most recent med list.               ALPRAZolam 1 MG tablet  Commonly known as:  XANAX  Take 1 tablet (1 mg total) by mouth 3 (three) times daily as needed for anxiety.     amLODipine 5 MG tablet  Commonly known as:  NORVASC  Take 1 tablet (5 mg total) by mouth daily.     amoxicillin 500 MG capsule  Commonly known as:  AMOXIL  Take 1 capsule (500 mg total) by mouth 2 (two) times daily.     bisacodyl 5 MG EC tablet  Commonly known as:  DULCOLAX  Take 5 mg by mouth daily as needed for mild constipation or moderate constipation (constipation).     calcium carbonate 750 MG chewable tablet  Commonly known as:  TUMS EX  Chew 1 tablet by mouth daily as needed for heartburn (heartburn).     cloNIDine 0.2 MG tablet  Commonly known as:  CATAPRES  Take 1 tablet (0.2 mg total) by mouth at bedtime.     divalproex 500 MG 24 hr tablet  Commonly known as:  DEPAKOTE ER  Take  1 tablet (500 mg total) by mouth at bedtime.     gabapentin 100 MG capsule  Commonly known as:  NEURONTIN  Take 2 capsules (200 mg total) by mouth 3 (three) times daily.     gabapentin 300 MG capsule  Commonly known as:  NEURONTIN  Take 1 capsule (300 mg total) by mouth at bedtime.     hydrOXYzine 50 MG tablet  Commonly known as:  ATARAX/VISTARIL  Take 2 tablets (100 mg total) by mouth at bedtime as needed (sleep).     magnesium hydroxide 400 MG/5ML suspension  Commonly known as:  MILK OF MAGNESIA  Take 30 mLs by mouth daily as needed for mild constipation.     metoprolol succinate 100 MG 24 hr tablet  Commonly known as:  TOPROL-XL  Take 1 tablet (100 mg total) by mouth daily. Take with or immediately following a meal.     mirtazapine 45 MG  tablet  Commonly known as:  REMERON  Take 1 tablet (45 mg total) by mouth at bedtime.     polyethylene glycol packet  Commonly known as:  MIRALAX / GLYCOLAX  Take 17 g by mouth daily as needed for mild constipation (constipation).     testosterone 50 MG/5GM (1%) Gel  Commonly known as:  ANDROGEL  Place 5 g onto the skin daily.     zolpidem 10 MG tablet  Commonly known as:  AMBIEN  Take 1 tablet (10 mg total) by mouth at bedtime.        Allergies:  Allergies  Allergen Reactions  . Diflucan [Fluconazole] Diarrhea  . Ciprofloxacin Other (See Comments)    GI distress  . Milk-Related Compounds Other (See Comments)    GI problems  . Pepto-Bismol [Bismuth Subsalicylate] Other (See Comments)    "Bad reaction"  . Trazodone And Nefazodone Other (See Comments)    Burn tongue and causes blisters  . Sulfur Other (See Comments)    GI distress     Family History: Family History  Problem Relation Age of Onset  . Stroke Mother   . Stroke Maternal Grandmother   . Diabetes Mellitus II Sister     Social History:  reports that he quit smoking about 3 months ago. His smoking use included Cigarettes. He does not have any smokeless tobacco history on file. He reports that he drinks alcohol. He reports that he does not use illicit drugs.  ROS: Urological Symptom Review  Patient is experiencing the following symptoms: Erection problems (male only)   Review of Systems  Gastrointestinal (upper)  : Negative for upper GI symptoms  Gastrointestinal (lower) : Constipation  Constitutional : Negative for symptoms  Skin: Negative for skin symptoms  Eyes: Blurred vision  Ear/Nose/Throat : Negative for Ear/Nose/Throat symptoms  Hematologic/Lymphatic: Negative for Hematologic/Lymphatic symptoms  Cardiovascular : Negative for cardiovascular symptoms  Respiratory : Negative for respiratory symptoms  Endocrine: Negative for endocrine symptoms  Musculoskeletal: Negative for  musculoskeletal symptoms  Neurological: Negative for neurological symptoms  Psychologic: Depression Bipolar diease and schophrenia   Physical Exam: BP 116/80 mmHg  Pulse 53  Resp 18  Ht 5\' 7"  (1.702 m)  Wt 167 lb 14.4 oz (76.159 kg)  BMI 26.29 kg/m2  GU: Patient with normal phallus.  Urethral meatus is patent.  No penile discharge. No penile lesions or rashes. Scrotum without lesions, cysts, rashes and/or edema.  Testicles are located scrotally bilaterally. No masses are appreciated in the testicles. Left and right epididymis are normal. Rectal: Patient with  normal sphincter  tone. Perineum without scarring or rashes. No rectal masses are appreciated. Prostate is approximately 45 grams, no nodules are appreciated, tender, not boggy.  Seminal vesicles are normal.   Laboratory Data: Results for orders placed or performed in visit on 11/19/14  Microscopic Examination  Result Value Ref Range   WBC, UA 0-5 0 -  5 /hpf   RBC, UA 3-10 (A) 0 -  2 /hpf   Epithelial Cells (non renal) 0-10 0 - 10 /hpf   Renal Epithel, UA 0-10 (A) None seen /hpf   Casts Present (A) None seen /lpf   Cast Type Hyaline casts N/A   Bacteria, UA Few None seen/Few  Urinalysis, Complete  Result Value Ref Range   Specific Gravity, UA 1.015 1.005 - 1.030   pH, UA 7.0 5.0 - 7.5   Color, UA Yellow Yellow   Appearance Ur Clear Clear   Leukocytes, UA Negative Negative   Protein, UA Negative Negative/Trace   Glucose, UA Negative Negative   Ketones, UA Negative Negative   RBC, UA Trace (A) Negative   Bilirubin, UA Negative Negative   Urobilinogen, Ur 0.2 0.2 - 1.0 mg/dL   Nitrite, UA Negative Negative   Microscopic Examination See below:   BLADDER SCAN AMB NON-IMAGING  Result Value Ref Range   Scan Result 18ml    Lab Results  Component Value Date   WBC 6.8 11/10/2014   HGB 13.5 11/10/2014   HCT 38.5* 11/10/2014   MCV 86.5 11/10/2014   PLT 396 11/10/2014    Lab Results  Component Value Date    CREATININE 0.94 11/10/2014    No results found for: PSA  Lab Results  Component Value Date   TESTOSTERONE 336* 10/19/2014    Lab Results  Component Value Date   HGBA1C 5.7* 10/20/2014    Urinalysis    Component Value Date/Time   COLORURINE STRAW* 11/01/2014 0010   APPEARANCEUR CLEAR* 11/01/2014 0010   LABSPEC 1.006 11/01/2014 0010   PHURINE 7.0 11/01/2014 0010   GLUCOSEU NEGATIVE 11/01/2014 0010   HGBUR NEGATIVE 11/01/2014 0010   BILIRUBINUR NEGATIVE 11/01/2014 0010   KETONESUR NEGATIVE 11/01/2014 0010   PROTEINUR NEGATIVE 11/01/2014 0010   UROBILINOGEN 0.2 10/15/2014 1816   NITRITE NEGATIVE 11/01/2014 0010   LEUKOCYTESUR NEGATIVE 11/01/2014 0010    Pertinent Imaging:  Assessment & Plan:    1.  Ejaculatory disorder:  I reassured the patient that this is not abnormal finding in men as they grow older and there prostate enlarged. He is still very distressed over this condition.  2. Chronic prostatitis:  Patient could not be evaluated or treated through physical therapy due to his lack of insurance. He cannot tolerate antibiotics as they cause GI distress. Alpha-blocker's may cause further ejaculatory disorders, so they are not an option for this patient.  The medications, finasteride and dutasteride, may also cause sexual side effects.  Patient is very fixated his sexual performance at this time.  I also do not want to prescribed Elavil due to patient's complicated psych history and would defer that to his psychiatrist.  His IPSS score is 12/2.  His PVR is 49 mL.  His baseline PSA was 0.5 ng/mL on 07/23/2013.  3. Changes in cognitive function:  Patient would like a referral to a neurologist because he states these changes have occurred since his episode of malignant hypertension. He is worried about damage to his brain due to that episode of hypertension. - Urinalysis, Complete - BLADDER SCAN AMB NON-IMAGING  4. Microscopic hematuria:  Patient was found to have 3-10 RBC's  per high-power field on today's urinalysis. He will percent in 2 weeks for a follow-up UA.  5. Erectile dysfunction:  Patient is unable to afford PDE 5 inhibitors. He is requesting a more affordable option for his erectile dysfunction. We discussed intracavernosal injections and he would like to investigate if this is an affordable option for him. We have called in a test dose of Tr[facility] and [facility] for [facility]. He will contact us if he wants to pursue a Trimex test dose.  No Follow-up on file.  Zara Council, [facility] [address], [place], [place] [address] [phone]

## 2014-11-19 NOTE — Telephone Encounter (Signed)
Would you call in the Trimix test dose for this patient?

## 2014-11-19 NOTE — ED Notes (Signed)
Pt called asking for different dose of ambien because he says the 10 mg works better.  Pt has multiple visits this month, and i cannot see that the rx was written on last visit.  i asked about pcp andpateint says dr Ancil Boozer wants his psych doctor to write him ambien rx.  He says his psych doctor in changing over to a different doctor and he can't get appt for weeks.  I explained that he should go to [facility] as a walk in and that they were open every day.  Pt says he hasnt slept and has no ride.  I told him about the [facility] bus.  He said he is in [place] and the bus does not come there.  I told him he should try to get a ride tomorrow.  He kept asking me to call [facility] to change the med.  I told him again that he needs to go to [facility] or call his pcp for instructions.

## 2014-11-19 NOTE — ED Notes (Signed)
Pt called asking for different dose of ambien because he says the 10 mg works better.  Pt has multiple visits this month, and i cannot see that the rx was written on last visit.  i asked about pcp andpateint says dr Ancil Boozer wants his psych doctor to write him ambien rx.  He says his psych doctor in changing over to a different doctor and he can't get appt for weeks.  I explained that he should go to [facility] as a walk in and that they were open every day.  Pt says he hasnt slept and has no ride.  I told him about the [facility] bus.  He said he is in [place] and the bus does not come there.  I told him he should try to get a ride tomorrow.  He kept asking me to call [facility] to change the med.  I told him again that he needs to go to [facility] or call his pcp for instructions.

## 2014-11-20 NOTE — Telephone Encounter (Signed)
Medication called into [facility].  

## 2014-11-22 ENCOUNTER — Telehealth: Payer: Self-pay | Admitting: Urology

## 2014-11-22 DIAGNOSIS — R3129 Other microscopic hematuria: Secondary | ICD-10-CM

## 2014-11-22 NOTE — Telephone Encounter (Signed)
Mr. Woodin is down for a PSA draw on 11/26/2014.  We need to change that to an UA to check for micro heme.

## 2014-11-24 NOTE — Telephone Encounter (Signed)
Orders have been put in and appt changed. Cw,lpn

## 2014-11-25 ENCOUNTER — Telehealth: Payer: Self-pay | Admitting: Urology

## 2014-11-25 NOTE — Telephone Encounter (Signed)
Received a fax from Leola with the Physical Therapy Department.  She states "patient's phone has been disconnected."

## 2014-11-26 ENCOUNTER — Emergency Department
Admission: EM | Admit: 2014-11-26 | Discharge: 2014-11-26 | Disposition: A | Discharge status: Home / Self Care | Payer: Self-pay | Attending: Emergency Medicine | Admitting: Emergency Medicine

## 2014-11-26 ENCOUNTER — Other Ambulatory Visit: Payer: Self-pay

## 2014-11-26 ENCOUNTER — Encounter: Payer: Self-pay | Admitting: *Deleted

## 2014-11-26 DIAGNOSIS — Z79899 Other long term (current) drug therapy: Secondary | ICD-10-CM | POA: Insufficient documentation

## 2014-11-26 DIAGNOSIS — R3129 Other microscopic hematuria: Secondary | ICD-10-CM

## 2014-11-26 DIAGNOSIS — F419 Anxiety disorder, unspecified: Secondary | ICD-10-CM | POA: Insufficient documentation

## 2014-11-26 DIAGNOSIS — G8929 Other chronic pain: Secondary | ICD-10-CM | POA: Insufficient documentation

## 2014-11-26 DIAGNOSIS — R1012 Left upper quadrant pain: Secondary | ICD-10-CM | POA: Insufficient documentation

## 2014-11-26 DIAGNOSIS — K5900 Constipation, unspecified: Secondary | ICD-10-CM | POA: Insufficient documentation

## 2014-11-26 DIAGNOSIS — Z87891 Personal history of nicotine dependence: Secondary | ICD-10-CM | POA: Insufficient documentation

## 2014-11-26 DIAGNOSIS — R531 Weakness: Secondary | ICD-10-CM | POA: Insufficient documentation

## 2014-11-26 DIAGNOSIS — R195 Other fecal abnormalities: Secondary | ICD-10-CM | POA: Insufficient documentation

## 2014-11-26 DIAGNOSIS — I10 Essential (primary) hypertension: Secondary | ICD-10-CM | POA: Insufficient documentation

## 2014-11-26 LAB — BASIC METABOLIC PANEL
Anion gap: 13 (ref 5–15)
BUN: 5 mg/dL — ABNORMAL LOW (ref 6–20)
CO2: 22 mmol/L (ref 22–32)
CREATININE: 0.92 mg/dL (ref 0.61–1.24)
Calcium: 9.5 mg/dL (ref 8.9–10.3)
Chloride: 93 mmol/L — ABNORMAL LOW (ref 101–111)
GFR calc Af Amer: >60 mL/min (ref >60)
GFR calc non Af Amer: >60 mL/min (ref >60)
Glucose, Bld: 105 mg/dL — ABNORMAL HIGH (ref 65–99)
POTASSIUM: 3.5 mmol/L (ref 3.5–5.1)
Sodium: 128 mmol/L — ABNORMAL LOW (ref 135–145)

## 2014-11-26 LAB — URINALYSIS, COMPLETE
Bilirubin, UA: NEGATIVE
Glucose, UA: NEGATIVE
Ketones, UA: NEGATIVE
LEUKOCYTES UA: NEGATIVE
Nitrite, UA: NEGATIVE
PROTEIN UA: NEGATIVE
Specific Gravity, UA: 1.01 (ref 1.005–1.030)
Urobilinogen, Ur: 0.2 mg/dL (ref 0.2–1.0)
pH, UA: 7 (ref 5.0–7.5)

## 2014-11-26 LAB — MICROSCOPIC EXAMINATION: BACTERIA UA: NONE SEEN

## 2014-11-26 LAB — CBC WITH DIFFERENTIAL/PLATELET
BASOS PCT: 1 %
Basophils Absolute: 0 10*3/uL (ref 0–0.1)
Eosinophils Absolute: 0 10*3/uL (ref 0–0.7)
Eosinophils Relative: 1 %
HCT: 38.9 % — ABNORMAL LOW (ref 40.0–52.0)
Hemoglobin: 13.6 g/dL (ref 13.0–18.0)
LYMPHS PCT: 24 %
Lymphs Abs: 1.5 10*3/uL (ref 1.0–3.6)
MCH: 29.9 pg (ref 26.0–34.0)
MCHC: 35 g/dL (ref 32.0–36.0)
MCV: 85.2 fL (ref 80.0–100.0)
Monocytes Absolute: 0.6 10*3/uL (ref 0.2–1.0)
Monocytes Relative: 9 %
NEUTROS ABS: 4.3 10*3/uL (ref 1.4–6.5)
NEUTROS PCT: 65 %
Platelets: 339 10*3/uL (ref 150–440)
RBC: 4.56 MIL/uL (ref 4.40–5.90)
RDW: 12.2 % (ref 11.5–14.5)
WBC: 6.5 10*3/uL (ref 3.8–10.6)

## 2014-11-26 LAB — URINALYSIS COMPLETE WITH MICROSCOPIC (ARMC ONLY)
BACTERIA UA: NONE SEEN
BILIRUBIN URINE: NEGATIVE
GLUCOSE, UA: NEGATIVE mg/dL
KETONES UR: NEGATIVE mg/dL
Leukocytes, UA: NEGATIVE
Nitrite: NEGATIVE
PH: 6 (ref 5.0–8.0)
Protein, ur: NEGATIVE mg/dL
SPECIFIC GRAVITY, URINE: 1.006 (ref 1.005–1.030)

## 2014-11-26 LAB — LIPASE, BLOOD: Lipase: 38 U/L (ref 22–51)

## 2014-11-26 MED ORDER — METOCLOPRAMIDE HCL 5 MG PO TABS: 5.0000 mg | ORAL_TABLET | Freq: Three times a day (TID) | ORAL | Status: DC

## 2014-11-26 NOTE — ED Notes (Signed)
Pt. Reports experiencing left side abdominal pain x months. Pt reports nausea and diarrhea but denies vomiting. Pt alert and oriented. Described increase weakness over the last few days.

## 2014-11-26 NOTE — ED Provider Notes (Signed)
[facility] Emergency Department Provider Note  ____________________________________________  Time seen:    1700  I have reviewed the triage vital signs and the nursing notes.   HISTORY  Chief Complaint Abdominal Pain and Weakness     HPI HEMI CHACKO is a 49 y.o. male reports that he has been having abdominal issues and difficulties with bowel movements for 4 months.  He reports that when he, the food cannot move through. He feels discomfort after he eats starting in the left upper quadrant and extending down into the left abdomen.  He has seen his regular doctor, Dr. Ancil Boozer, a gastroenterologist, Dr. Candace Cruise, and is also been seen at [facility] for this complaint.  Patient reports that in order to have a bowel movement he needs she use milk of magnesia. He describes small narrow stools.   The patient did undergo colonoscopy approximately a month ago.  Patient reports that he cannot eat because of the discomfort and he is getting weak.   Past Medical History  Diagnosis Date  . Hypertension   . Rectal fistula   . Dysuria   . Diverticulosis   . Gastroparesis   . Borderline personality disorder   . Major depression   . Schizophrenia   . Hypokalemia   . Microscopic hematuria   . External hemorrhoid   . Over weight   . Nicotine addiction   . Difficulty urinating     Patient Active Problem List   Diagnosis Date Noted  . Cognitive and behavioral changes 11/22/2014  . Microscopic hematuria 11/22/2014  . Disorder of ejaculation 11/22/2014  . 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 H/O autism spectrum disorder 10/20/2014  . MDD (major depressive disorder), recurrent episode, with atypical features 10/19/2014  . GAD (generalized anxiety disorder) 10/19/2014  . Tooth ache 10/19/2014  . Suicidal ideation   . Benign essential HTN 08/27/2014  . Clinical depression 05/16/2014  .  Affective bipolar disorder 12/05/2013  . Decreased potassium in the blood 12/05/2013  . 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 Prostatitis 12/05/2013  . Dementia praecox 12/05/2013    Past Surgical History  Procedure Laterality Date  . Lumbar spine surgery    . Colonoscopy    . Colonoscopy with propofol N/A 10/12/2014    Procedure: COLONOSCOPY WITH PROPOFOL;  Surgeon: Hulen Luster, MD;  Location: [facility] ENDOSCOPY;  Service: Gastroenterology;  Laterality: N/A;  . Tonsillectomy      Current Outpatient Rx  Name  Route  Sig  Dispense  Refill  . ALPRAZolam (XANAX) 1 MG tablet   Oral   Take 1 tablet (1 mg total) by mouth 3 (three) times daily as needed for anxiety.   30 tablet   0   . amLODipine (NORVASC) 5 MG tablet   Oral   Take 1 tablet (5 mg total) by mouth daily.   30 tablet   0   . amoxicillin (AMOXIL) 500 MG capsule   Oral   Take 1 capsule (500 mg total) by mouth 2 (two) times daily. Patient not taking: Reported on 11/01/2014   8 capsule   0   . bisacodyl (DULCOLAX) 5 MG EC tablet   Oral   Take 5 mg by mouth daily as needed for mild constipation or moderate constipation (constipation).          . calcium carbonate (TUMS EX) 750 MG chewable tablet   Oral   Chew 1 tablet by mouth daily  as needed for heartburn (heartburn).         . cloNIDine (CATAPRES) 0.2 MG tablet   Oral   Take 1 tablet (0.2 mg total) by mouth at bedtime. Patient taking differently: Take 0.2 mg by mouth 2 (two) times daily.    30 tablet   0   . divalproex (DEPAKOTE ER) 500 MG 24 hr tablet   Oral   Take 1 tablet (500 mg total) by mouth at bedtime. Patient not taking: Reported on 11/19/2014   30 tablet   0   . gabapentin (NEURONTIN) 100 MG capsule   Oral   Take 2 capsules (200 mg total) by mouth 3 (three) times daily. Patient not taking: Reported on 11/19/2014   180 capsule   0   . gabapentin (NEURONTIN) 300 MG capsule   Oral   Take 1 capsule (300 mg total) by mouth at bedtime. Patient  not taking: Reported on 11/19/2014   30 capsule   0   . hydrOXYzine (ATARAX/VISTARIL) 50 MG tablet   Oral   Take 2 tablets (100 mg total) by mouth at bedtime as needed (sleep).   60 tablet   0   . magnesium hydroxide (MILK OF MAGNESIA) 400 MG/5ML suspension   Oral   Take 30 mLs by mouth daily as needed for mild constipation.         . metoprolol succinate (TOPROL-XL) 100 MG 24 hr tablet   Oral   Take 1 tablet (100 mg total) by mouth daily. Take with or immediately following a meal.   30 tablet   0   . mirtazapine (REMERON) 45 MG tablet   Oral   Take 1 tablet (45 mg total) by mouth at bedtime. Patient not taking: Reported on 11/19/2014   30 tablet   0   . polyethylene glycol (MIRALAX / GLYCOLAX) packet   Oral   Take 17 g by mouth daily as needed for mild constipation (constipation).         Marland Kitchen testosterone (ANDROGEL) 50 MG/5GM (1%) GEL   Transdermal   Place 5 g onto the skin daily. Patient not taking: Reported on 11/19/2014   1 Tube   0     Pt to followup with [facility] for hypog ...   . EXPIRED: zolpidem (AMBIEN) 10 MG tablet   Oral   Take 1 tablet (10 mg total) by mouth at bedtime.   30 tablet   0     Allergies Diflucan; Ciprofloxacin; Milk-related compounds; Pepto-bismol; Trazodone and nefazodone; and Sulfur  Family History  Problem Relation Age of Onset  . Stroke Mother   . Stroke Maternal Grandmother   . Diabetes Mellitus II Sister     Social History History  Substance Use Topics  . Smoking status: Former Smoker    Types: Cigarettes    Quit date: 08/19/2014  . Smokeless tobacco: Not on file  . Alcohol Use: 0.0 oz/week    0 Standard drinks or equivalent per week     Comment: every day or every other day (2-4  12oz cans) moderate use    Review of Systems  Constitutional: Negative for fever. ENT: Negative for sore throat. Cardiovascular: Negative for chest pain. Patient is concerned about episodes of bradycardia. Respiratory:  Negative for shortness of breath. Gastrointestinal: Notable for abdominal pain. No vomiting or diarrhea. Constipation, low caliber stools. See history of present illness Genitourinary: Negative for dysuria. Musculoskeletal: No myalgias or injuries. Skin: Negative for rash. Neurological: Negative for headaches Psychological:  No change per patient. Noted history of autism spectrum disorder.  10-point ROS otherwise negative.  ____________________________________________   PHYSICAL EXAM:  VITAL SIGNS: ED Triage Vitals  Enc Vitals Group     BP 11/26/14 1539 124/88 mmHg     Pulse Rate 11/26/14 1539 63     Resp 11/26/14 1539 20     Temp 11/26/14 1539 98.1 F (36.7 C)     Temp Source 11/26/14 1539 Oral     SpO2 11/26/14 1539 99 %     Weight 11/26/14 1539 165 lb (74.844 kg)     Height 11/26/14 1539 5\' 7"  (1.702 m)     Head Cir --      Peak Flow --      Pain Score 11/26/14 1540 8     Pain Loc --      Pain Edu? --      Excl. in [place]? --     Constitutional:  Alert. Odd affect. Very focused on providing a great deal of history from the past 4 months.  ENT   Head: Normocephalic and atraumatic.   Nose: No congestion/rhinnorhea. Cardiovascular: Normal rate at 60, regular rhythm, no murmur noted Respiratory:  Normal respiratory effort, no tachypnea.    Breath sounds are clear and equal bilaterally.  Gastrointestinal: Benign exam. Patient takes a great deal of time and she into his left abdomen saying he can feel a problem there. there has no tenderness. There are no masses noted on my exam. Normal bowel sounds. Back: No muscle spasm, no tenderness, no CVA tenderness. Musculoskeletal: No deformity noted. Nontender with normal range of motion in all extremities.  No noted edema. Neurologic:  Normal speech and language. No gross focal neurologic deficits are appreciated.  Skin:  Skin is warm, dry. No rash noted. Psychiatric: Affect with some essence of anxiety regarding this subacute  or chronic abdominal issue. ____________________________________________    LABS (pertinent positives/negatives)  Labs Reviewed  CBC WITH DIFFERENTIAL/PLATELET - Abnormal; Notable for the following:    HCT 38.9 (*)    All other components within normal limits  BASIC METABOLIC PANEL - Abnormal; Notable for the following:    Sodium 128 (*)    Chloride 93 (*)    Glucose, Bld 105 (*)    BUN 5 (*)    All other components within normal limits  URINALYSIS COMPLETEWITH MICROSCOPIC ([facility] ONLY) - Abnormal; Notable for the following:    Color, Urine YELLOW (*)    APPearance CLEAR (*)    Hgb urine dipstick 1+ (*)    Squamous Epithelial / LPF 0-5 (*)    All other components within normal limits  LIPASE, BLOOD     ____________________________________________   INITIAL IMPRESSION / ASSESSMENT AND PLAN / ED COURSE  Pertinent labs & imaging results that were available during my care of the patient were reviewed by me and considered in my medical decision making (see chart for details).  Physically well-appearing 49 year old male with an odd affect and a history of heart test expect from disorder.  The patient has a benign abdomen and overall normal blood tests. While he reports he is not able to eat, he has no ketones present in urine. His sodium is little low at 128, but the remainder of his blood tests appear normal.  I am referring the patient back to his gastroenterologist, Dr. Candace Cruise, and to his primary physician, Dr. Ancil Boozer.  I have counseled him to return the emergency department if his pain is worsening or if  he has other urgent concerns.  ____________________________________________   FINAL CLINICAL IMPRESSION(S) / ED DIAGNOSES  Final diagnoses:  Abdominal pain, chronic, left upper quadrant      Ahmed Prima, MD 11/26/14 1725

## 2014-11-26 NOTE — Discharge Instructions (Signed)
It is not clear what is been causing your 4 months of abdominal discomfort and bowel dysfunction. Follow-up with the gastroenterologist, Dr. Candace Cruise, and with your regular doctor.  Turn to the emergency department if you're having worsening pain or other urgent concerns.  Abdominal Pain Many things can cause abdominal pain. Usually, abdominal pain is not caused by a disease and will improve without treatment. It can often be observed and treated at home. Your health care provider will do a physical exam and possibly order blood tests and X-rays to help determine the seriousness of your pain. However, in many cases, more time must pass before a clear cause of the pain can be found. Before that point, your health care provider may not know if you need more testing or further treatment. HOME CARE INSTRUCTIONS  Monitor your abdominal pain for any changes. The following actions may help to alleviate any discomfort you are experiencing:  Only take over-the-counter or prescription medicines as directed by your health care provider.  Do not take laxatives unless directed to do so by your health care provider.  Try a clear liquid diet (broth, tea, or water) as directed by your health care provider. Slowly move to a bland diet as tolerated. SEEK MEDICAL CARE IF:  You have unexplained abdominal pain.  You have abdominal pain associated with nausea or diarrhea.  You have pain when you urinate or have a bowel movement.  You experience abdominal pain that wakes you in the night.  You have abdominal pain that is worsened or improved by eating food.  You have abdominal pain that is worsened with eating fatty foods.  You have a fever. SEEK IMMEDIATE MEDICAL CARE IF:   Your pain does not go away within 2 hours.  You keep throwing up (vomiting).  Your pain is felt only in portions of the abdomen, such as the right side or the left lower portion of the abdomen.  You pass bloody or black tarry  stools. MAKE SURE YOU:  Understand these instructions.   Will watch your condition.   Will get help right away if you are not doing well or get worse.  Document Released: 02/15/2005 Document Revised: 05/13/2013 Document Reviewed: 01/15/2013 [facility] Patient Information 2015 [place], [place]. This information is not intended to replace advice given to you by your health care provider. Make sure you discuss any questions you have with your health care provider.

## 2014-11-26 NOTE — ED Notes (Signed)
Pt to triage via wheelchair.  Pt states he has abd pain after eating.  Pain on left side of abdomen.  No vomiting or diarrhea.  Pt reports feeling weak.  No difficulty urinating.  Pt alert.

## 2014-11-27 ENCOUNTER — Telehealth: Payer: Self-pay

## 2014-11-27 DIAGNOSIS — R3129 Other microscopic hematuria: Secondary | ICD-10-CM

## 2014-11-27 NOTE — Telephone Encounter (Signed)
-----   Message from Nori Riis, PA-C sent at 11/26/2014  5:16 PM EDT ----- Patient still has micro heme.  He has had numerous CT scans.  Schedule a RUS.

## 2014-11-27 NOTE — Telephone Encounter (Signed)
LMOM

## 2014-11-30 NOTE — Telephone Encounter (Signed)
-----   Message from Nori Riis, PA-C sent at 11/26/2014  5:16 PM EDT ----- Patient still has micro heme.  He has had numerous CT scans.  Schedule a RUS.

## 2014-11-30 NOTE — Telephone Encounter (Signed)
LMOM

## 2014-11-30 NOTE — Telephone Encounter (Signed)
Pt returned call and was had some questions. Pt c/o painful ejaculation that was "dry". Per Dr. Jeffie Pollock pt was advised to get RUS and once we get the results we will go from there. Pt had to be talked down several times but eventually agreed to this plan. RUS orders have been placed. Cw,lpn

## 2014-12-01 ENCOUNTER — Telehealth: Payer: Self-pay | Admitting: Urology

## 2014-12-01 DIAGNOSIS — R3129 Other microscopic hematuria: Secondary | ICD-10-CM

## 2014-12-01 NOTE — Telephone Encounter (Signed)
[place] order needs to be changed to US Renal and signed off.

## 2014-12-02 ENCOUNTER — Other Ambulatory Visit: Payer: Self-pay | Admitting: Family Medicine

## 2014-12-02 ENCOUNTER — Telehealth: Payer: Self-pay | Admitting: Urology

## 2014-12-02 DIAGNOSIS — R3129 Other microscopic hematuria: Secondary | ICD-10-CM

## 2014-12-02 NOTE — Telephone Encounter (Signed)
I have never seen this patient and keep receiving notes from him.  I am not his pcp. Thank you

## 2014-12-02 NOTE — Telephone Encounter (Signed)
done

## 2014-12-02 NOTE — Telephone Encounter (Signed)
Patient called stating he thinks that he is retaining fluid. He states that he has had about a gallon of water today and only urinated a small amount. I explained that our office was closed since it was 5pm and to go to the ER so that they could see how much urine was in his bladder and place a catheter if neccessary. He stated that his problem was not with peeing and he knew that his bladder was empty but his belly was bigger than yesterday and he thinks he is holding fluid in his belly not his bladder. I instructed again to go to the ER for evaluation or to contact PCP, urgent care, or acute care since this would need evaluation. This information was repeated a few times with the patient, he was very concerned. Patient stated that he was previously on HCTZ but is no longer taking. Patient has had multiple correspondence with our office the past week or two and is having a renal u/s done next week and will follow up with [facility] to review results a few days later. Patient has showed paranoid behavior during these correspondences and seems to be concerned with different things during each encounter. After speaking with Dr. Erlene Quan in regards to this she would like for PCP to be aware this is going on to see if he needs to be seen for evaluation. I will forward this message to Dr. Ancil Boozer to make her aware.

## 2014-12-08 ENCOUNTER — Ambulatory Visit
Admission: RE | Admit: 2014-12-08 | Discharge: 2014-12-08 | Disposition: A | Discharge status: Home / Self Care | Payer: Self-pay | Source: Ambulatory Visit | Attending: Urology | Admitting: Urology

## 2014-12-08 DIAGNOSIS — R312 Other microscopic hematuria: Secondary | ICD-10-CM | POA: Insufficient documentation

## 2014-12-08 DIAGNOSIS — R109 Unspecified abdominal pain: Secondary | ICD-10-CM | POA: Insufficient documentation

## 2014-12-09 ENCOUNTER — Telehealth: Payer: Self-pay

## 2014-12-09 NOTE — Telephone Encounter (Signed)
Spoke with pt wife, Abigail Butts, made aware of RUS results. Abigail Butts voiced understanding.

## 2014-12-09 NOTE — Telephone Encounter (Signed)
-----   Message from Hollice Espy, MD sent at 12/08/2014  5:55 PM EDT ----- Please let patient know renal ultrasound is normal.  Hollice Espy, MD

## 2014-12-11 ENCOUNTER — Telehealth: Payer: Self-pay | Admitting: Urology

## 2014-12-11 NOTE — Telephone Encounter (Signed)
Spoke with pt in reference to pain described as 10/10. Pt stated that the blood flow to his pelvic region has been cut off. Nurse made pt aware if the pain is that bad and he feels as though the blood flow is cut off then he should go to the ER. Pt declined stating "they have red flagged me and dont take me seriously". Pt requested an angiogram stating this would make his life easier. Nurse made pt aware per [facility] an angiogram is not medically necessary therefore insurance would not pay for it. Pt stated "thats ok [place] will eventually get their money". Nurse noted pt has an appt with La[facility] on Monday. Nurse made pt aware of Monday's appt. Pt voiced understanding. Nurse offered pt to go to the ER again. Pt declined once again. Nurse made pt aware he would have to wait until appt because [facility] wanted to do an exam before ordering any test. Pt voiced understanding.

## 2014-12-11 NOTE — Telephone Encounter (Signed)
Jeremiah [name] called saying he's having severe prostate pain. The level on a scale of 1-10 is a "10 times 10" per the pt. He believes it may be a possible blood flow problem and is wondering if he can have an Angiogram. His blood pressure is also increased. He'd like a phone call ASAP if possible. Pt ph# [phone] Thank you.

## 2014-12-14 ENCOUNTER — Ambulatory Visit (INDEPENDENT_AMBULATORY_CARE_PROVIDER_SITE_OTHER): Payer: Self-pay | Admitting: Urology

## 2014-12-14 ENCOUNTER — Encounter: Payer: Self-pay | Admitting: Urology

## 2014-12-14 ENCOUNTER — Encounter: Payer: Self-pay | Admitting: Emergency Medicine

## 2014-12-14 ENCOUNTER — Emergency Department
Admission: EM | Admit: 2014-12-14 | Discharge: 2014-12-14 | Disposition: A | Discharge status: Home / Self Care | Payer: Self-pay | Attending: Emergency Medicine | Admitting: Emergency Medicine

## 2014-12-14 VITALS — BP 83/55 | HR 64 | Ht 67.0 in | Wt 158.1 lb

## 2014-12-14 DIAGNOSIS — R45851 Suicidal ideations: Secondary | ICD-10-CM

## 2014-12-14 DIAGNOSIS — F32A Depression, unspecified: Secondary | ICD-10-CM

## 2014-12-14 DIAGNOSIS — R312 Other microscopic hematuria: Secondary | ICD-10-CM

## 2014-12-14 DIAGNOSIS — Z79899 Other long term (current) drug therapy: Secondary | ICD-10-CM | POA: Insufficient documentation

## 2014-12-14 DIAGNOSIS — Z791 Long term (current) use of non-steroidal anti-inflammatories (NSAID): Secondary | ICD-10-CM | POA: Insufficient documentation

## 2014-12-14 DIAGNOSIS — F339 Major depressive disorder, recurrent, unspecified: Secondary | ICD-10-CM

## 2014-12-14 DIAGNOSIS — F329 Major depressive disorder, single episode, unspecified: Secondary | ICD-10-CM | POA: Insufficient documentation

## 2014-12-14 DIAGNOSIS — N529 Male erectile dysfunction, unspecified: Secondary | ICD-10-CM | POA: Insufficient documentation

## 2014-12-14 DIAGNOSIS — N528 Other male erectile dysfunction: Secondary | ICD-10-CM

## 2014-12-14 DIAGNOSIS — R3129 Other microscopic hematuria: Secondary | ICD-10-CM

## 2014-12-14 DIAGNOSIS — F489 Nonpsychotic mental disorder, unspecified: Secondary | ICD-10-CM

## 2014-12-14 DIAGNOSIS — N411 Chronic prostatitis: Secondary | ICD-10-CM

## 2014-12-14 DIAGNOSIS — Z87891 Personal history of nicotine dependence: Secondary | ICD-10-CM | POA: Insufficient documentation

## 2014-12-14 DIAGNOSIS — I10 Essential (primary) hypertension: Secondary | ICD-10-CM | POA: Insufficient documentation

## 2014-12-14 HISTORY — DX: Cerebral infarction, unspecified: I63.9

## 2014-12-14 LAB — MICROSCOPIC EXAMINATION
Bacteria, UA: NONE SEEN
EPITHELIAL CELLS (NON RENAL): NONE SEEN /HPF (ref 0–10)
WBC, UA: NONE SEEN /hpf (ref 0–?)

## 2014-12-14 LAB — CBC
HEMATOCRIT: 39.7 % — AB (ref 40.0–52.0)
Hemoglobin: 13.7 g/dL (ref 13.0–18.0)
MCH: 29.7 pg (ref 26.0–34.0)
MCHC: 34.6 g/dL (ref 32.0–36.0)
MCV: 85.9 fL (ref 80.0–100.0)
PLATELETS: 340 10*3/uL (ref 150–440)
RBC: 4.62 MIL/uL (ref 4.40–5.90)
RDW: 12.1 % (ref 11.5–14.5)
WBC: 5.2 10*3/uL (ref 3.8–10.6)

## 2014-12-14 LAB — COMPREHENSIVE METABOLIC PANEL
ALBUMIN: 4.3 g/dL (ref 3.5–5.0)
ALT: 16 U/L — AB (ref 17–63)
AST: 24 U/L (ref 15–41)
Alkaline Phosphatase: 52 U/L (ref 38–126)
Anion gap: 8 (ref 5–15)
BILIRUBIN TOTAL: 0.5 mg/dL (ref 0.3–1.2)
BUN: 7 mg/dL (ref 6–20)
CALCIUM: 9.6 mg/dL (ref 8.9–10.3)
CHLORIDE: 100 mmol/L — AB (ref 101–111)
CO2: 25 mmol/L (ref 22–32)
CREATININE: 1.04 mg/dL (ref 0.61–1.24)
GFR calc Af Amer: >60 mL/min (ref >60)
GLUCOSE: 110 mg/dL — AB (ref 65–99)
Potassium: 3.4 mmol/L — ABNORMAL LOW (ref 3.5–5.1)
SODIUM: 133 mmol/L — AB (ref 135–145)
Total Protein: 6.9 g/dL (ref 6.5–8.1)

## 2014-12-14 LAB — URINALYSIS, COMPLETE
BILIRUBIN UA: NEGATIVE
Glucose, UA: NEGATIVE
KETONES UA: NEGATIVE
LEUKOCYTES UA: NEGATIVE
Nitrite, UA: NEGATIVE
PH UA: 7 (ref 5.0–7.5)
Protein, UA: NEGATIVE
SPEC GRAV UA: 1.01 (ref 1.005–1.030)
Urobilinogen, Ur: 0.2 mg/dL (ref 0.2–1.0)

## 2014-12-14 LAB — ETHANOL: Alcohol, Ethyl (B): <5 mg/dL (ref <5)

## 2014-12-14 LAB — SALICYLATE LEVEL: Salicylate Lvl: <4 mg/dL (ref 2.8–30.0)

## 2014-12-14 LAB — ACETAMINOPHEN LEVEL: Acetaminophen (Tylenol), Serum: <10 ug/mL — ABNORMAL LOW (ref 10–30)

## 2014-12-14 NOTE — Progress Notes (Signed)
12/14/2014 11:11 AM   Jeremiah [name] 1966/04/14 [id number]  Referring provider: Steele Sizer, MD [address] [place], [place] [address]  Chief Complaint  Patient presents with  . Follow-up    pt f/u diagnostic procedure. Pt reports been off all his psych meds x 47mth.     HPI: Jeremiah [name] is 49 year old white male with ED and microscopic hematuria who present today for RUS results.  The RUS was performed due the patient's persistent microscopic hematuria.  He has had numerous CT scans of the abdomen recently, that is why CT urogram was not pursued.   The renal ultrasound was within normal limits. Patient stated that he knew the renal ultrasound would be normal and wanted to discuss his erectile dysfunction and prostate pain.  He has been very distressed over his chronic prostate pain.  He states that he feels like someone is cutting him with a knife in his prostate. He is not had a bowel movement in 10 days.    He also feels that he has a "clot" blocking the blood flow to his intestines which is causing him to have ED.  I explained to him that his ED was most likely the result of his years of heavy smoking.  He was not satisfied with this explanation.  He feels that the ED started right after his HTN crisis.  I agreed that this definitely added to the damage of his microvasculature to the penis.    He voiced his disbelief that someone his age would have ED and not respond to PDE5-inhibitors.  I stated that about 40% of men in their 75's will experience ED and that with his past history of heavy smoking, he is at greater risk for this.    We had discussed the use of TriMix at his last visit.  He has no interest in this treatment.  He then stated he would kill himself because of the ED.  In patient's words, " I'll kill myself if I have to keep living this way and I'll do it."     PMH: Past Medical History  Diagnosis Date  . Hypertension   . Rectal fistula   . Dysuria     . Diverticulosis   . Gastroparesis   . Borderline personality disorder   . Major depression   . Schizophrenia   . Hypokalemia   . Microscopic hematuria   . External hemorrhoid   . Over weight   . Nicotine addiction   . Difficulty urinating     Surgical History: Past Surgical History  Procedure Laterality Date  . Lumbar spine surgery    . Colonoscopy    . Colonoscopy with propofol N/A 10/12/2014    Procedure: COLONOSCOPY WITH PROPOFOL;  Surgeon: Hulen Luster, MD;  Location[facility] ENDOSCOPY;  Service: Gastroenterology;  Laterality: N/A;  . Tonsillectomy      Home Medications:    Medication List       This list is accurate as of: 12/14/14 11:11 AM.  Always use your most recent med list.               ALPRAZolam 1 MG tablet  Commonly known as:  XANAX  Take 1 tablet (1 mg total) by mouth 3 (three) times daily as needed for anxiety.     amLODipine 5 MG tablet  Commonly known as:  NORVASC  Take 1 tablet (5 mg total) by mouth daily.     amoxicillin 500 MG capsule  Commonly known as:  AMOXIL  Take 1 capsule (500 mg total) by mouth 2 (two) times daily.     bisacodyl 5 MG EC tablet  Commonly known as:  DULCOLAX  Take 5 mg by mouth daily as needed for mild constipation or moderate constipation (constipation).     calcium carbonate 750 MG chewable tablet  Commonly known as:  TUMS EX  Chew 1 tablet by mouth daily as needed for heartburn (heartburn).     cloNIDine 0.2 MG tablet  Commonly known as:  CATAPRES  Take 1 tablet (0.2 mg total) by mouth at bedtime.     divalproex 500 MG 24 hr tablet  Commonly known as:  DEPAKOTE ER  Take 1 tablet (500 mg total) by mouth at bedtime.     gabapentin 100 MG capsule  Commonly known as:  NEURONTIN  Take 2 capsules (200 mg total) by mouth 3 (three) times daily.     gabapentin 300 MG capsule  Commonly known as:  NEURONTIN  Take 1 capsule (300 mg total) by mouth at bedtime.     hydrOXYzine 50 MG tablet  Commonly known as:   ATARAX/VISTARIL  Take 2 tablets (100 mg total) by mouth at bedtime as needed (sleep).     magnesium hydroxide 400 MG/5ML suspension  Commonly known as:  MILK OF MAGNESIA  Take 30 mLs by mouth daily as needed for mild constipation.     metoCLOPramide 5 MG tablet  Commonly known as:  REGLAN  Take 1 tablet (5 mg total) by mouth 3 (three) times daily before meals.     metoprolol succinate 100 MG 24 hr tablet  Commonly known as:  TOPROL-XL  Take 1 tablet (100 mg total) by mouth daily. Take with or immediately following a meal.     mirtazapine 45 MG tablet  Commonly known as:  REMERON  Take 1 tablet (45 mg total) by mouth at bedtime.     polyethylene glycol packet  Commonly known as:  MIRALAX / GLYCOLAX  Take 17 g by mouth daily as needed for mild constipation (constipation).     testosterone 50 MG/5GM (1%) Gel  Commonly known as:  ANDROGEL  Place 5 g onto the skin daily.     zolpidem 10 MG tablet  Commonly known as:  AMBIEN  Take 1 tablet (10 mg total) by mouth at bedtime.     zolpidem 12.5 MG CR tablet  Commonly known as:  AMBIEN CR  Take 12.5 mg by mouth at bedtime as needed. for sleep        Allergies:  Allergies  Allergen Reactions  . Diflucan [Fluconazole] Diarrhea  . Ciprofloxacin Other (See Comments)    GI distress  . Milk-Related Compounds Other (See Comments)    GI problems  . Pepto-Bismol [Bismuth Subsalicylate] Other (See Comments)    "Bad reaction"  . Trazodone And Nefazodone Other (See Comments)    Burn tongue and causes blisters  . Sulfur Other (See Comments)    GI distress     Family History: Family History  Problem Relation Age of Onset  . Stroke Mother   . Stroke Maternal Grandmother   . Diabetes Mellitus II Sister     Social History:  reports that he quit smoking about 3 months ago. His smoking use included Cigarettes. He does not have any smokeless tobacco history on file. He reports that he does not drink alcohol or use illicit  drugs.  ROS: UROLOGY Frequent Urination?: No Hard to postpone urination?: No  Burning/pain with urination?: Yes Get up at night to urinate?: No Leakage of urine?: No Urine stream starts and stops?: No Trouble starting stream?: No Do you have to strain to urinate?: No Blood in urine?: No Urinary tract infection?: No Sexually transmitted disease?: No Injury to kidneys or bladder?: No Painful intercourse?: No Weak stream?: Yes Erection problems?: No Penile pain?: No  Gastrointestinal Nausea?: No Vomiting?: No Indigestion/heartburn?: No Diarrhea?: No Constipation?: Yes  Constitutional Fever: No Night sweats?: No Weight loss?: Yes Fatigue?: Yes  Skin Skin rash/lesions?: No Itching?: Yes  Eyes Blurred vision?: Yes Double vision?: No  Ears/Nose/Throat Sore throat?: No Sinus problems?: No  Hematologic/Lymphatic Swollen glands?: No Easy bruising?: No  Cardiovascular Leg swelling?: No Chest pain?: No  Respiratory Cough?: No Shortness of breath?: No  Endocrine Excessive thirst?: No  Musculoskeletal Back pain?: Yes Joint pain?: Yes  Neurological Headaches?: Yes Dizziness?: No  Psychologic Depression?: No Anxiety?: No  Physical Exam: BP 83/55 mmHg  Pulse 64  Ht 5\' 7"  (1.702 m)  Wt 158 lb 1.6 oz (71.714 kg)  BMI 24.76 kg/m2   Laboratory Data: Lab Results  Component Value Date   WBC 6.5 11/26/2014   HGB 13.6 11/26/2014   HCT 38.9* 11/26/2014   MCV 85.2 11/26/2014   PLT 339 11/26/2014    Lab Results  Component Value Date   CREATININE 0.92 11/26/2014    No results found for: PSA  Lab Results  Component Value Date   TESTOSTERONE 336* 10/19/2014    Lab Results  Component Value Date   HGBA1C 5.7* 10/20/2014    Urinalysis    Component Value Date/Time   COLORURINE YELLOW* 11/26/2014 1543   COLORURINE Colorless 08/20/2014 2122   APPEARANCEUR CLEAR* 11/26/2014 1543   APPEARANCEUR Clear 08/20/2014 2122   LABSPEC 1.006 11/26/2014  1543   LABSPEC 1.002 08/20/2014 2122   PHURINE 6.0 11/26/2014 1543   PHURINE 7.0 08/20/2014 2122   GLUCOSEU Negative 11/26/2014 1543   GLUCOSEU NEGATIVE 11/26/2014 1543   GLUCOSEU Negative 08/20/2014 2122   HGBUR 1+* 11/26/2014 1543   HGBUR 2+ 08/20/2014 2122   BILIRUBINUR Negative 11/26/2014 [place] NEGATIVE 11/26/2014 1543   BILIRUBINUR Negative 08/20/2014 2122   KETONESUR NEGATIVE 11/26/2014 1543   KETONESUR Negative 08/20/2014 2122   PROTEINUR NEGATIVE 11/26/2014 1543   PROTEINUR Negative 08/20/2014 2122   UROBILINOGEN 0.2 10/15/2014 1816   NITRITE Negative 11/26/2014 1543   NITRITE NEGATIVE 11/26/2014 1543   NITRITE Negative 08/20/2014 2122   LEUKOCYTESUR Negative 11/26/2014 1543   LEUKOCYTESUR NEGATIVE 11/26/2014 1543   LEUKOCYTESUR Negative 08/20/2014 2122    Pertinent Imaging: CLINICAL DATA: Microhematuria. Left flank pain.  EXAM: RENAL / URINARY TRACT ULTRASOUND COMPLETE  COMPARISON: CT 10/02/2014.  FINDINGS: Right Kidney:  Length: 11.5 cm. Echogenicity within normal limits. No mass or hydronephrosis visualized.  Left Kidney:  Length: 11.1 cm. Echogenicity within normal limits. No mass or hydronephrosis visualized.  Bladder:  Appears normal for degree of bladder distention. Bilateral ureteral jets visualized.  IMPRESSION: Normal exam.   Electronically Signed  By: Marcello Moores Register  On: 12/08/2014 17:01   Assessment & Plan:    1. Microscopic hematuria:   Patient has had multiple CT's recently and a cystoscopy last year for a hematuria work up.  His workup did not find any GU pathology.  He is still having microscopic hematuria today.  We will refer to nephrology for further workup.   - Urinalysis, Complete  2. Suicidal threats:   Patient has been off his psychotropic  medications for 1 month. During our office exam, the patient made threats that he would kill himself.  He also made threats to the front desk staff about  killing himself.   At that point, I asked the patient to return to his exam room as he was trying to leave the office. He then said he was just kidding about his threats. I then emphasized to the patient that I must take his threats seriously and we must get him the appropriate help he needs.   He then continued to walk out of the office and the staff and myself asked him to return and he refused.  He then returned to the office within a few minutes and agreed to go to the hospital by EMS for further management of his suicidal ideations.  He was then carried to [facility] ED by EMS.    3. Chronic prostatitis: Patient could not be evaluated or treated through physical therapy due to his lack of insurance. He cannot tolerate antibiotics as they cause GI distress. Alpha-blocker's may cause further ejaculatory disorders, so they are not an option for this patient. The medications, finasteride and dutasteride, may also cause sexual side effects. Patient is very fixated his sexual performance at this time. I also do not want to prescribed Elavil due to patient's complicated psych history and would defer that to his psychiatrist. His IPSS score is 12/2. His PVR is 49 mL. His baseline PSA was 0.5 ng/mL on 07/23/2013.  5. Erectile dysfunction: Patient stated at today's visit that Cialis did not work for him.  We have called in a test dose of TriMix to [facility] for the patient. He is not interested in TriMix at this time.   No Follow-up on file.  Zara Council, [facility] [address], [place], [place] [address] [phone]

## 2014-12-14 NOTE — Progress Notes (Signed)
LCSW went to meet and greet patient after consulting ED physician. This patient did disclose that he is unemployed and living in a very dusty room  provided by his sister. He reports he has medical issues and has not slept for 1 1/2 years and since October has not pooped and no one knows what's wrong with him. After some questioning his doctor has sent him to a sleep clinic but since he couldn't take pills while there he chose not to participate. He also disclosed that he has had several tests for his stomach and bowels and still no one knows what's wrong with him. did injectable steroids a year and half ago. He was asked if he was homicidal or suicidal. Patient stated he was not but had enough over the counter sleep aids to assist with sleep but they dont work.   LCSW clarified with patient and stated I would be back with some resources and felt that he would benefit from some talk therapy in the future. Patient agreed.

## 2014-12-14 NOTE — Progress Notes (Signed)
LCSW met with patient and provided brochures. Patient became verbally aggressive and started yelling at [facility], 2 police, psychiatrist and attending nurse All staff attended to patient and he will now be discharged from ED.

## 2014-12-14 NOTE — Consult Note (Signed)
[facility] Face-to-Face Psychiatry Consult   Reason for Consult:  Consult for 49 year old man with a history of mood symptoms and possible autism. Concern about possible suicidality2 Referring Physician:  Corky Downs Patient Identification: Jeremiah [name] MRN:  [id number] Principal Diagnosis: MDD (major depressive disorder), recurrent episode, with atypical features Diagnosis:   Patient Active Problem List   Diagnosis Date Noted  . Suicide threat or attempt [F48.9] 12/14/2014  . Cognitive and behavioral changes [R41.89, F91.9] 11/22/2014  . Microscopic hematuria [R31.2] 11/22/2014  . Disorder of ejaculation [N50.8] 11/22/2014  . 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 H/O autism spectrum disorder [Z86.59] 10/20/2014  . MDD (major depressive disorder), recurrent episode, with atypical features [F33.9] 10/19/2014  . GAD (generalized anxiety disorder) [F41.1] 10/19/2014  . Tooth ache [K08.8] 10/19/2014  . Suicidal ideation [R45.851]   . Benign essential HTN [I10] 08/27/2014  . Clinical depression [F32.9] 05/16/2014  . 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 Decreased potassium in the blood [E87.6] 12/05/2013  . 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 Prostatitis [N41.9] 12/05/2013  . 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 male patient admitted with "I haven't had a bowel movement in 10 days".  HPI:  Information from the patient and the chart. Patient was at a urology appointment today to be evaluated for erectile dysfunction. Apparently he got upset and make comments about how he "didn't want to be here". Patient says they misinterpreted it. He denies that he has any suicidal intent or plan. He admits that his mood stays frustrated and down much of the time. He complains of insomnia and claiming that he only sleeps 2  hours a night and this is been going on since last October. He has several other medical problems that he is upset about most particularly his constipation. He denies auditory or visual hallucinations. He thing that sounds like specific paranoia. Says that he is still taking medicine prescribed by his psychiatrist in [place] which is primarily Xanax and Ativan.  Past psychiatric history: This patient has had psychiatric admissions in the past and has also been evaluated by me in consult in the past. My impression of him is the same as it is now which is that he is very peculiar in his interaction style. He takes things to extremes, reacts in ways that seem socially inappropriate, becomes very agitated over what sound like fairly manageable problems. He has struck me in the past as being either autistic spectrum or having a personality disorder. He does not have a history of actual suicide attempts in the past. He's been treated with a variety of medicines including anti-psychotics antidepressives and anxiolytics with only mild improvement in symptoms.  Social history: Currently living with his sister and her family. Not working.  Medical history: Chronic constipation. Erectile dysfunction. He tends to have a lot of of relatively minor medical problems that he gets fixated on.  Family history: Noncontributory  Substance abuse history: Patient says that he is not currently drinking and that that's a big problem for him. He says that ever since he's been constipated he's not able to drink beer anymore which used to be a thing that he really valued. [place] that he is abusing any other drugs.  Current medication: Says he takes Xanax 1 mg 3 times a day and Ambien 5-10 mg at night so  that he takes high blood pressure medicine HPI Elements:   Quality:  Frustration irritability dysphoria. Severity:  Moderate and chronic. Not actually intending to kill himself. Timing:  Worse over the last couple  weeks. Duration:  Chronic issue. Context:  Multiple medical problems also not working and it sounds like he is not really satisfied in his social situation..  Past Medical History:  Past Medical History  Diagnosis Date  . Hypertension   . Rectal fistula   . Dysuria   . Diverticulosis   . Gastroparesis   . Borderline personality disorder   . Major depression   . Schizophrenia   . Hypokalemia   . Microscopic hematuria   . External hemorrhoid   . Over weight   . Nicotine addiction   . Difficulty urinating   . Asthma   . Stroke     Past Surgical History  Procedure Laterality Date  . Lumbar spine surgery    . Colonoscopy    . Colonoscopy with propofol N/A 10/12/2014    Procedure: COLONOSCOPY WITH PROPOFOL;  Surgeon: Hulen Luster, MD;  Location: [facility] ENDOSCOPY;  Service: Gastroenterology;  Laterality: N/A;  . Tonsillectomy     Family History:  Family History  Problem Relation Age of Onset  . Stroke Mother   . Stroke Maternal Grandmother   . Diabetes Mellitus II Sister    Social History:  History  Alcohol Use No    Comment: every day or every other day (2-4  12oz cans) moderate use     History  Drug Use No    History   Social History  . Marital Status: Single    Spouse Name: N/A  . Number of Children: N/A  . Years of Education: N/A   Social History Main Topics  . Smoking status: Former Smoker    Types: Cigarettes    Quit date: 08/19/2014  . Smokeless tobacco: Not on file  . Alcohol Use: No     Comment: every day or every other day (2-4  12oz cans) moderate use  . Drug Use: No  . Sexual Activity: Not on file   Other Topics Concern  . None   Social History Narrative   Additional Social History:                          Allergies:   Allergies  Allergen Reactions  . Diflucan [Fluconazole] Diarrhea  . Ciprofloxacin Other (See Comments)    GI distress  . Milk-Related Compounds Other (See Comments)    GI problems  . Pepto-Bismol [Bismuth  Subsalicylate] Other (See Comments)    "Bad reaction"  . Trazodone And Nefazodone Other (See Comments)    Burn tongue and causes blisters  . Sulfur Other (See Comments)    GI distress     Labs:  Results for orders placed or performed during the hospital encounter of 12/14/14 (from the past 48 hour(s))  Comprehensive metabolic panel     Status: Abnormal   Collection Time: 12/14/14 12:10 PM  Result Value Ref Range   Sodium 133 (L) 135 - 145 mmol/L   Potassium 3.4 (L) 3.5 - 5.1 mmol/L   Chloride 100 (L) 101 - 111 mmol/L   CO2 25 22 - 32 mmol/L   Glucose, Bld 110 (H) 65 - 99 mg/dL   BUN 7 6 - 20 mg/dL   Creatinine, Ser 1.04 0.61 - 1.24 mg/dL   Calcium 9.6 8.9 - 10.3 mg/dL   Total  Protein 6.9 6.5 - 8.1 g/dL   Albumin 4.3 3.5 - 5.0 g/dL   AST 24 15 - 41 U/L   ALT 16 (L) 17 - 63 U/L   Alkaline Phosphatase 52 38 - 126 U/L   Total Bilirubin 0.5 0.3 - 1.2 mg/dL   GFR calc non Af Amer >60 >60 mL/min   GFR calc Af Amer >60 >60 mL/min    Comment: (NOTE) The eGFR has been calculated using the CKD EPI equation. This calculation has not been validated in all clinical situations. eGFR's persistently <60 mL/min signify possible Chronic Kidney Disease.    Anion gap 8 5 - 15  Ethanol (ETOH)     Status: None   Collection Time: 12/14/14 12:10 PM  Result Value Ref Range   Alcohol, Ethyl (B) <5 <5 mg/dL    Comment:        LOWEST DETECTABLE LIMIT FOR SERUM ALCOHOL IS 5 mg/dL FOR MEDICAL PURPOSES ONLY   Salicylate level     Status: None   Collection Time: 12/14/14 12:10 PM  Result Value Ref Range   Salicylate Lvl <9.6 2.8 - 30.0 mg/dL  Acetaminophen level     Status: Abnormal   Collection Time: 12/14/14 12:10 PM  Result Value Ref Range   Acetaminophen (Tylenol), Serum <10 (L) 10 - 30 ug/mL    Comment:        THERAPEUTIC CONCENTRATIONS VARY SIGNIFICANTLY. A RANGE OF 10-30 ug/mL MAY BE AN EFFECTIVE CONCENTRATION FOR MANY PATIENTS. HOWEVER, SOME ARE BEST TREATED AT CONCENTRATIONS  OUTSIDE THIS RANGE. ACETAMINOPHEN CONCENTRATIONS >150 ug/mL AT 4 HOURS AFTER INGESTION AND >50 ug/mL AT 12 HOURS AFTER INGESTION ARE OFTEN ASSOCIATED WITH TOXIC REACTIONS.   CBC     Status: Abnormal   Collection Time: 12/14/14 12:10 PM  Result Value Ref Range   WBC 5.2 3.8 - 10.6 K/uL   RBC 4.62 4.40 - 5.90 MIL/uL   Hemoglobin 13.7 13.0 - 18.0 g/dL   HCT 39.7 (L) 40.0 - 52.0 %   MCV 85.9 80.0 - 100.0 fL   MCH 29.7 26.0 - 34.0 pg   MCHC 34.6 32.0 - 36.0 g/dL   RDW 12.1 11.5 - 14.5 %   Platelets 340 150 - 440 K/uL    Vitals: Blood pressure 122/69, pulse 57, temperature 98.2 F (36.8 C), temperature source Oral, resp. rate 18, height _0  (1.702 m), weight 70.308 kg (155 lb), SpO2 98 %.  Risk to Self: Is patient at risk for suicide?: Yes Risk to Others:   Prior Inpatient Therapy:   Prior Outpatient Therapy:    No current facility-administered medications for this encounter.   Current Outpatient Prescriptions  Medication Sig Dispense Refill  . ALPRAZolam (XANAX) 1 MG tablet Take 1 tablet (1 mg total) by mouth 3 (three) times daily as needed for anxiety. 30 tablet 0  . amLODipine (NORVASC) 5 MG tablet Take 1 tablet (5 mg total) by mouth daily. 30 tablet 0  . amoxicillin (AMOXIL) 500 MG capsule Take 1 capsule (500 mg total) by mouth 2 (two) times daily. (Patient not taking: Reported on 11/01/2014) 8 capsule 0  . bisacodyl (DULCOLAX) 5 MG EC tablet Take 5 mg by mouth daily as needed for mild constipation or moderate constipation (constipation).     . calcium carbonate (TUMS EX) 750 MG chewable tablet Chew 1 tablet by mouth daily as needed for heartburn (heartburn).    . cloNIDine (CATAPRES) 0.2 MG tablet Take 1 tablet (0.2 mg total) by mouth at bedtime. (Patient  taking differently: Take 0.2 mg by mouth 2 (two) times daily. ) 30 tablet 0  . divalproex (DEPAKOTE ER) 500 MG 24 hr tablet Take 1 tablet (500 mg total) by mouth at bedtime. (Patient not taking: Reported on 12/14/2014) 30  tablet 0  . gabapentin (NEURONTIN) 100 MG capsule Take 2 capsules (200 mg total) by mouth 3 (three) times daily. (Patient not taking: Reported on 11/19/2014) 180 capsule 0  . gabapentin (NEURONTIN) 300 MG capsule Take 1 capsule (300 mg total) by mouth at bedtime. (Patient not taking: Reported on 11/19/2014) 30 capsule 0  . hydrOXYzine (ATARAX/VISTARIL) 50 MG tablet Take 2 tablets (100 mg total) by mouth at bedtime as needed (sleep). 60 tablet 0  . magnesium hydroxide (MILK OF MAGNESIA) 400 MG/5ML suspension Take 30 mLs by mouth daily as needed for mild constipation.    . metoCLOPramide (REGLAN) 5 MG tablet Take 1 tablet (5 mg total) by mouth 3 (three) times daily before meals. (Patient not taking: Reported on 12/14/2014) 16 tablet 0  . metoprolol succinate (TOPROL-XL) 100 MG 24 hr tablet Take 1 tablet (100 mg total) by mouth daily. Take with or immediately following a meal. 30 tablet 0  . mirtazapine (REMERON) 45 MG tablet Take 1 tablet (45 mg total) by mouth at bedtime. (Patient not taking: Reported on 11/19/2014) 30 tablet 0  . polyethylene glycol (MIRALAX / GLYCOLAX) packet Take 17 g by mouth daily as needed for mild constipation (constipation).    Marland Kitchen testosterone (ANDROGEL) 50 MG/5GM (1%) GEL Place 5 g onto the skin daily. (Patient not taking: Reported on 11/19/2014) 1 Tube 0  . zolpidem (AMBIEN CR) 12.5 MG CR tablet Take 12.5 mg by mouth at bedtime as needed. for sleep  1  . zolpidem (AMBIEN) 10 MG tablet Take 1 tablet (10 mg total) by mouth at bedtime. 30 tablet 0    Musculoskeletal: Strength & Muscle Tone: within normal limits Gait & Station: normal Patient leans: N/A  Psychiatric Specialty Exam: Physical Exam  Constitutional: He appears well-developed and well-nourished.  HENT:  Head: Normocephalic and atraumatic.  Eyes: Conjunctivae are normal. Pupils are equal, round, and reactive to light.  Neck: Normal range of motion.  Cardiovascular: Normal heart sounds.   Respiratory: Effort  normal.  GI: Soft.  Musculoskeletal: Normal range of motion.  Neurological: He is alert.  Skin: Skin is warm and dry.  Psychiatric: His speech is normal and behavior is normal. Thought content normal. His mood appears anxious. His affect is not labile. Cognition and memory are normal. He expresses impulsivity. He exhibits a depressed mood.  Patient presents as a casually groomed gentleman who is mostly cooperative with the interview. Eye contact is intermittent psychomotor activity normal. No evidence of acute delusions or hallucinations. Denies current suicidal or homicidal intent    Review of Systems  Constitutional: Negative.        Complains of insomnia  HENT: Negative.   Eyes: Negative.   Respiratory: Negative.   Cardiovascular: Negative.   Gastrointestinal: Positive for constipation.  Genitourinary:       Patient is complaining of erectile dysfunction  Musculoskeletal: Negative.   Skin: Negative.   Neurological: Negative.   Psychiatric/Behavioral: Positive for depression. Negative for suicidal ideas, hallucinations, memory loss and substance abuse. The patient is nervous/anxious and has insomnia.     Blood pressure 122/69, pulse 57, temperature 98.2 F (36.8 C), temperature source Oral, resp. rate 18, height _0  (1.702 m), weight 70.308 kg (155 lb), SpO2 98 %.Body mass index  is 24.27 kg/(m^2).  General Appearance: Casual  Eye Contact::  Minimal  Speech:  Normal Rate  Volume:  Increased  Mood:  Dysphoric  Affect:  Inappropriate  Thought Process:  Circumstantial  Orientation:  Full (Time, Place, and Person)  Thought Content:  Negative  Suicidal Thoughts:  No  Homicidal Thoughts:  No  Memory:  Immediate;   Good Recent;   Good Remote;   Fair  Judgement:  Impaired  Insight:  Shallow  Psychomotor Activity:  Normal  Concentration:  Fair  Recall:  [employer] of Knowledge:Fair  Language: Good  Akathisia:  No  Handed:  Right  AIMS (if indicated):     Assets:   Communication Skills Desire for Improvement Physical Health Resilience Social Support  ADL's:  Intact  Cognition: WNL  Sleep:      Medical Decision Making: Established Problem, Stable/Improving (1), Review of Psycho-Social Stressors (1), Review or order clinical lab tests (1), Review of Medication Regimen & Side Effects (2) and Review of New Medication or Change in Dosage (2)  Treatment Plan Summary: Plan Patient does not require psychiatric hospitalization. He Artie has outpatient treatment in place. There is no other problem that we are likely to be able to intervene with in the emergency room that is going to get much better. Patient was encouraged to continue following up with his outpatient therapist and psychiatrist and wished good fortune in following up with his medical complaints. Case discussed with emergency room doctor. Labs reviewed. Nothing requiring further treatment. He can be released home at this point  Plan:  Patient does not meet criteria for psychiatric inpatient admission. Disposition: Discharged from emergency room follow-up with his usual doctor in [place] 12/14/2014 3:22 PM

## 2014-12-14 NOTE — Discharge Instructions (Signed)

## 2014-12-14 NOTE — ED Notes (Signed)
Pt discharged home after verbalizing understanding of discharge instructions; nad noted. 

## 2014-12-14 NOTE — ED Provider Notes (Signed)
[facility] Emergency Department Provider Note  ____________________________________________  Time seen: On arrival, via EMS  I have reviewed the triage vital signs and the nursing notes.   HISTORY  Chief Complaint Suicidal    HPI Jeremiah [name] is a 49 y.o. male who presents from urologist's office because he stated he was depressed because of erectile dysfunction. He told them he didn't want to be here anymore but he admits to me that he is not serious and he was "kidding"     Past Medical History  Diagnosis Date  . Hypertension   . Rectal fistula   . Dysuria   . Diverticulosis   . Gastroparesis   . Borderline personality disorder   . Major depression   . Schizophrenia   . Hypokalemia   . Microscopic hematuria   . External hemorrhoid   . Over weight   . Nicotine addiction   . Difficulty urinating   . Asthma   . Stroke     Patient Active Problem List   Diagnosis Date Noted  . Suicide threat or attempt 12/14/2014  . Cognitive and behavioral changes 11/22/2014  . Microscopic hematuria 11/22/2014  . Disorder of ejaculation 11/22/2014  . 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 H/O autism spectrum disorder 10/20/2014  . MDD (major depressive disorder), recurrent episode, with atypical features 10/19/2014  . GAD (generalized anxiety disorder) 10/19/2014  . Tooth ache 10/19/2014  . Suicidal ideation   . Benign essential HTN 08/27/2014  . Clinical depression 05/16/2014  . Affective bipolar disorder 12/05/2013  . Decreased potassium in the blood 12/05/2013  . 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 Prostatitis 12/05/2013  . Dementia praecox 12/05/2013    Past Surgical History  Procedure Laterality Date  . Lumbar spine surgery    . Colonoscopy    . Colonoscopy with propofol N/A 10/12/2014    Procedure: COLONOSCOPY WITH PROPOFOL;  Surgeon: Hulen Luster, MD;  Location: [facility]  ENDOSCOPY;  Service: Gastroenterology;  Laterality: N/A;  . Tonsillectomy      Current Outpatient Rx  Name  Route  Sig  Dispense  Refill  . ALPRAZolam (XANAX) 1 MG tablet   Oral   Take 1 tablet (1 mg total) by mouth 3 (three) times daily as needed for anxiety.   30 tablet   0   . amLODipine (NORVASC) 5 MG tablet   Oral   Take 1 tablet (5 mg total) by mouth daily.   30 tablet   0   . amoxicillin (AMOXIL) 500 MG capsule   Oral   Take 1 capsule (500 mg total) by mouth 2 (two) times daily. Patient not taking: Reported on 11/01/2014   8 capsule   0   . bisacodyl (DULCOLAX) 5 MG EC tablet   Oral   Take 5 mg by mouth daily as needed for mild constipation or moderate constipation (constipation).          . calcium carbonate (TUMS EX) 750 MG chewable tablet   Oral   Chew 1 tablet by mouth daily as needed for heartburn (heartburn).         . cloNIDine (CATAPRES) 0.2 MG tablet   Oral   Take 1 tablet (0.2 mg total) by mouth at bedtime. Patient taking differently: Take 0.2 mg by mouth 2 (two) times daily.    30 tablet   0   . divalproex (DEPAKOTE ER) 500 MG 24 hr tablet   Oral  Take 1 tablet (500 mg total) by mouth at bedtime. Patient not taking: Reported on 12/14/2014   30 tablet   0   . gabapentin (NEURONTIN) 100 MG capsule   Oral   Take 2 capsules (200 mg total) by mouth 3 (three) times daily. Patient not taking: Reported on 11/19/2014   180 capsule   0   . gabapentin (NEURONTIN) 300 MG capsule   Oral   Take 1 capsule (300 mg total) by mouth at bedtime. Patient not taking: Reported on 11/19/2014   30 capsule   0   . hydrOXYzine (ATARAX/VISTARIL) 50 MG tablet   Oral   Take 2 tablets (100 mg total) by mouth at bedtime as needed (sleep).   60 tablet   0   . magnesium hydroxide (MILK OF MAGNESIA) 400 MG/5ML suspension   Oral   Take 30 mLs by mouth daily as needed for mild constipation.         . metoCLOPramide (REGLAN) 5 MG tablet   Oral   Take 1 tablet  (5 mg total) by mouth 3 (three) times daily before meals. Patient not taking: Reported on 12/14/2014   16 tablet   0   . metoprolol succinate (TOPROL-XL) 100 MG 24 hr tablet   Oral   Take 1 tablet (100 mg total) by mouth daily. Take with or immediately following a meal.   30 tablet   0   . mirtazapine (REMERON) 45 MG tablet   Oral   Take 1 tablet (45 mg total) by mouth at bedtime. Patient not taking: Reported on 11/19/2014   30 tablet   0   . polyethylene glycol (MIRALAX / GLYCOLAX) packet   Oral   Take 17 g by mouth daily as needed for mild constipation (constipation).         Marland Kitchen testosterone (ANDROGEL) 50 MG/5GM (1%) GEL   Transdermal   Place 5 g onto the skin daily. Patient not taking: Reported on 11/19/2014   1 Tube   0     Pt to followup with [facility] for hypog ...   . zolpidem (AMBIEN CR) 12.5 MG CR tablet   Oral   Take 12.5 mg by mouth at bedtime as needed. for sleep      1   . EXPIRED: zolpidem (AMBIEN) 10 MG tablet   Oral   Take 1 tablet (10 mg total) by mouth at bedtime.   30 tablet   0     Allergies Diflucan; Ciprofloxacin; Milk-related compounds; Pepto-bismol; Trazodone and nefazodone; and Sulfur  Family History  Problem Relation Age of Onset  . Stroke Mother   . Stroke Maternal Grandmother   . Diabetes Mellitus II Sister     Social History History  Substance Use Topics  . Smoking status: Former Smoker    Types: Cigarettes    Quit date: 08/19/2014  . Smokeless tobacco: Not on file  . Alcohol Use: No     Comment: every day or every other day (2-4  12oz cans) moderate use    Review of Systems  Constitutional: Negative for fever. Eyes: Negative for visual changes. ENT: Negative for sore throat Cardiovascular: Negative for chest pain. Respiratory: Negative for shortness of breath. Gastrointestinal: Negative for abdominal pain, vomiting and diarrhea. Positive for constipation Genitourinary: Negative for dysuria. Positive for  erectile dysfunction Musculoskeletal: Negative for back pain. Skin: Negative for rash. Neurological: Negative for headaches or focal weakness Psychiatric: Positive for depression  10-point ROS otherwise negative.  ____________________________________________  PHYSICAL EXAM:  VITAL SIGNS: ED Triage Vitals  Enc Vitals Group     BP 12/14/14 1202 122/69 mmHg     Pulse Rate 12/14/14 1202 57     Resp 12/14/14 1202 18     Temp 12/14/14 1202 98.2 F (36.8 C)     Temp Source 12/14/14 1202 Oral     SpO2 12/14/14 1202 98 %     Weight 12/14/14 1202 155 lb (70.308 kg)     Height 12/14/14 1202 5\' 7"  (1.702 m)     Head Cir --      Peak Flow --      Pain Score 12/14/14 1203 10     Pain Loc --      Pain Edu? --      Excl. in [place]? --      Constitutional: Alert and oriented. Well appearing and in no distress. Eyes: Conjunctivae are normal.  ENT   Head: Normocephalic and atraumatic.   Mouth/Throat: Mucous membranes are moist. Cardiovascular: Normal rate, regular rhythm. Normal and symmetric distal pulses are present in all extremities. No murmurs, rubs, or gallops. Respiratory: Normal respiratory effort without tachypnea nor retractions. Breath sounds are clear and equal bilaterally.  Gastrointestinal: Soft and non-tender in all quadrants. No distention. There is no CVA tenderness. No concern for bowel obstruction Genitourinary: deferred Musculoskeletal: Nontender with normal range of motion in all extremities. No lower extremity tenderness nor edema. Neurologic:  Normal speech and language. No gross focal neurologic deficits are appreciated. Skin:  Skin is warm, dry and intact. No rash noted. Psychiatric: Mood and affect are normal. Patient exhibits appropriate insight and judgment.  ____________________________________________    LABS (pertinent positives/negatives)  Labs Reviewed  COMPREHENSIVE METABOLIC PANEL - Abnormal; Notable for the following:    Sodium 133 (*)     Potassium 3.4 (*)    Chloride 100 (*)    Glucose, Bld 110 (*)    ALT 16 (*)    All other components within normal limits  ACETAMINOPHEN LEVEL - Abnormal; Notable for the following:    Acetaminophen (Tylenol), Serum <10 (*)    All other components within normal limits  CBC - Abnormal; Notable for the following:    HCT 39.7 (*)    All other components within normal limits  ETHANOL  SALICYLATE LEVEL  URINE DRUG SCREEN, QUALITATIVE ([facility] ONLY)    ____________________________________________   EKG  None  ____________________________________________    RADIOLOGY I have personally reviewed any xrays that were ordered on this patient: None  ____________________________________________   PROCEDURES  Procedure(s) performed: none  Critical Care performed: none  ____________________________________________   INITIAL IMPRESSION / ASSESSMENT AND PLAN / ED COURSE  Pertinent labs & imaging results that were available during my care of the patient were reviewed by me and considered in my medical decision making (see chart for details).  Patient denies suicidality to me. However I will have psychiatry evaluate him.  Dr. Weber Cooks of psychiatry evaluating patient in the Emergency Department.  ----------------------------------------- 3:29 PM on 12/14/2014 -----------------------------------------  Dr. Weber Cooks has cleared the patient for discharge  ____________________________________________   FINAL CLINICAL IMPRESSION(S) / ED DIAGNOSES  Final diagnoses:  Depression     Lavonia Drafts, MD 12/14/14 1529

## 2014-12-14 NOTE — ED Notes (Signed)
Pt via ems from neurologist's office. He told staff at that office that he was not sleeping and that they weren't doing anything for him and that he "would be better off not here." (per pt). EMS reports that he voiced SI to office staff and said he had a plan. Pt is alert & oriented with NAD noted.

## 2014-12-30 ENCOUNTER — Other Ambulatory Visit: Payer: Self-pay | Admitting: Student

## 2014-12-30 DIAGNOSIS — M5441 Lumbago with sciatica, right side: Secondary | ICD-10-CM

## 2014-12-30 DIAGNOSIS — M5412 Radiculopathy, cervical region: Secondary | ICD-10-CM

## 2014-12-30 DIAGNOSIS — M542 Cervicalgia: Secondary | ICD-10-CM

## 2015-01-06 ENCOUNTER — Ambulatory Visit
Admission: RE | Admit: 2015-01-06 | Discharge: 2015-01-06 | Disposition: A | Discharge status: Home / Self Care | Payer: Self-pay | Source: Ambulatory Visit | Attending: Student | Admitting: Student

## 2015-01-06 DIAGNOSIS — M542 Cervicalgia: Secondary | ICD-10-CM

## 2015-01-06 DIAGNOSIS — M5127 Other intervertebral disc displacement, lumbosacral region: Secondary | ICD-10-CM | POA: Insufficient documentation

## 2015-01-06 DIAGNOSIS — M5022 Other cervical disc displacement, mid-cervical region: Secondary | ICD-10-CM | POA: Insufficient documentation

## 2015-01-06 DIAGNOSIS — M5412 Radiculopathy, cervical region: Secondary | ICD-10-CM

## 2015-01-06 DIAGNOSIS — M5441 Lumbago with sciatica, right side: Secondary | ICD-10-CM

## 2015-01-06 MED ORDER — GADOBENATE DIMEGLUMINE 529 MG/ML IV SOLN
15.0000 mL | Freq: Once | INTRAVENOUS | Status: AC | PRN
  Administered 2015-01-06: 14 mL via INTRAVENOUS

## 2015-01-09 ENCOUNTER — Emergency Department (HOSPITAL_COMMUNITY)
Admission: EM | Admit: 2015-01-09 | Discharge: 2015-01-09 | Disposition: A | Discharge status: Home / Self Care | Payer: Self-pay | Attending: Emergency Medicine | Admitting: Emergency Medicine

## 2015-01-09 ENCOUNTER — Emergency Department (HOSPITAL_COMMUNITY): Payer: Self-pay

## 2015-01-09 ENCOUNTER — Encounter (HOSPITAL_COMMUNITY): Payer: Self-pay

## 2015-01-09 DIAGNOSIS — Z8673 Personal history of transient ischemic attack (TIA), and cerebral infarction without residual deficits: Secondary | ICD-10-CM | POA: Insufficient documentation

## 2015-01-09 DIAGNOSIS — W1839XD Other fall on same level, subsequent encounter: Secondary | ICD-10-CM | POA: Insufficient documentation

## 2015-01-09 DIAGNOSIS — S060X0D Concussion without loss of consciousness, subsequent encounter: Secondary | ICD-10-CM | POA: Insufficient documentation

## 2015-01-09 DIAGNOSIS — I10 Essential (primary) hypertension: Secondary | ICD-10-CM | POA: Insufficient documentation

## 2015-01-09 DIAGNOSIS — Z79899 Other long term (current) drug therapy: Secondary | ICD-10-CM | POA: Insufficient documentation

## 2015-01-09 DIAGNOSIS — E663 Overweight: Secondary | ICD-10-CM | POA: Insufficient documentation

## 2015-01-09 DIAGNOSIS — F322 Major depressive disorder, single episode, severe without psychotic features: Secondary | ICD-10-CM | POA: Insufficient documentation

## 2015-01-09 DIAGNOSIS — Z87891 Personal history of nicotine dependence: Secondary | ICD-10-CM | POA: Insufficient documentation

## 2015-01-09 DIAGNOSIS — Z8719 Personal history of other diseases of the digestive system: Secondary | ICD-10-CM | POA: Insufficient documentation

## 2015-01-09 DIAGNOSIS — J45909 Unspecified asthma, uncomplicated: Secondary | ICD-10-CM | POA: Insufficient documentation

## 2015-01-09 MED ORDER — DIPHENHYDRAMINE HCL 25 MG PO CAPS
50.0000 mg | ORAL_CAPSULE | Freq: Once | ORAL | Status: DC
  Filled 2015-01-09: qty 2

## 2015-01-09 MED ORDER — KETOROLAC TROMETHAMINE 60 MG/2ML IM SOLN
60.0000 mg | Freq: Once | INTRAMUSCULAR | Status: DC
  Filled 2015-01-09: qty 2

## 2015-01-09 MED ORDER — DIPHENHYDRAMINE HCL 50 MG/ML IJ SOLN
25.0000 mg | Freq: Once | INTRAMUSCULAR | Status: AC
  Administered 2015-01-09: 25 mg via INTRAVENOUS
  Filled 2015-01-09: qty 1

## 2015-01-09 MED ORDER — METOCLOPRAMIDE HCL 10 MG PO TABS
10.0000 mg | ORAL_TABLET | Freq: Once | ORAL | Status: DC
  Filled 2015-01-09: qty 1

## 2015-01-09 MED ORDER — METOCLOPRAMIDE HCL 5 MG/ML IJ SOLN
10.0000 mg | Freq: Once | INTRAMUSCULAR | Status: AC
  Administered 2015-01-09: 10 mg via INTRAVENOUS
  Filled 2015-01-09: qty 2

## 2015-01-09 MED ORDER — KETOROLAC TROMETHAMINE 30 MG/ML IJ SOLN
30.0000 mg | Freq: Once | INTRAMUSCULAR | Status: AC
  Administered 2015-01-09: 30 mg via INTRAVENOUS
  Filled 2015-01-09: qty 1

## 2015-01-09 NOTE — ED Notes (Signed)
Pt verbalized understanding of d/c instructions and has no further questions.  

## 2015-01-09 NOTE — Discharge Instructions (Signed)
Concussion Jeremiah [name], your CT scan was negative.  You likely have a concussion.  Take motrin or tylenol as needed for your headache.  See a primary care doctor within 3 days for close follow up.  If symptoms worsen, come back to the ED immediately.  Thank you. A concussion is a brain injury. It is caused by:  A hit to the head.  A quick and sudden movement (jolt) of the head or neck. A concussion is usually not life threatening. Even so, it can cause serious problems. If you had a concussion before, you may have concussion-like problems after a hit to your head. HOME CARE General Instructions  Follow your doctor's directions carefully.  Take medicines only as told by your doctor.  Only take medicines your doctor says are safe.  Do not drink alcohol until your doctor says it is okay. Alcohol and some drugs can slow down healing. They can also put you at risk for further injury.  If you are having trouble remembering things, write them down.  Try to do one thing at a time if you get distracted easily. For example, do not watch TV while making dinner.  Talk to your family members or close friends when making important decisions.  Follow up with your doctor as told.  Watch your symptoms. Tell others to do the same. Serious problems can sometimes happen after a concussion. Older adults are more likely to have these problems.  Tell your teachers, school nurse, school counselor, coach, Product/process development scientist, or work Freight forwarder about your concussion. Tell them about what you can or cannot do. They should watch to see if:  It gets even harder for you to pay attention or concentrate.  It gets even harder for you to remember things or learn new things.  You need more time than normal to finish things.  You become annoyed (irritable) more than before.  You are not able to deal with stress as well.  You have more problems than before.  Rest. Make sure you:  Get plenty of sleep at  night.  Go to sleep early.  Go to bed at the same time every day. Try to wake up at the same time.  Rest during the day.  Take naps when you feel tired.  Limit activities where you have to think a lot or concentrate. These include:  Doing homework.  Doing work related to a job.  Watching TV.  Using the computer. Returning To Your Regular Activities Return to your normal activities slowly, not all at once. You must give your body and brain enough time to heal.   Do not play sports or do other athletic activities until your doctor says it is okay.  Ask your doctor when you can drive, ride a bicycle, or work other vehicles or machines. Never do these things if you feel dizzy.  Ask your doctor about when you can return to work or school. Preventing Another Concussion It is very important to avoid another brain injury, especially before you have healed. In rare cases, another injury can lead to permanent brain damage, brain swelling, or death. The risk of this is greatest during the first 7-10 days after your injury. Avoid injuries by:   Wearing a seat belt when riding in a car.  Not drinking too much alcohol.  Avoiding activities that could lead to a second concussion (such as contact sports).  Wearing a helmet when doing activities like:  Biking.  Skiing.  Skateboarding.  Skating.  Making your home safer by:  Removing things from the floor or stairways that could make you trip.  Using grab bars in bathrooms and handrails by stairs.  Placing non-slip mats on floors and in bathtubs.  Improve lighting in dark areas. GET HELP IF:  It gets even harder for you to pay attention or concentrate.  It gets even harder for you to remember things or learn new things.  You need more time than normal to finish things.  You become annoyed (irritable) more than before.  You are not able to deal with stress as well.  You have more problems than before.  You have  problems keeping your balance.  You are not able to react quickly when you should. Get help if you have any of these problems for more than 2 weeks:   Lasting (chronic) headaches.  Dizziness or trouble balancing.  Feeling sick to your stomach (nausea).  Seeing (vision) problems.  Being affected by noises or light more than normal.  Feeling sad, low, down in the dumps, blue, gloomy, or empty (depressed).  Mood changes (mood swings).  Feeling of fear or nervousness about what may happen (anxiety).  Feeling annoyed.  Memory problems.  Problems concentrating or paying attention.  Sleep problems.  Feeling tired all the time. GET HELP RIGHT AWAY IF:   You have bad headaches or your headaches get worse.  You have weakness (even if it is in one hand, leg, or part of the face).  You have loss of feeling (numbness).  You feel off balance.  You keep throwing up (vomiting).  You feel tired.  One black center of your eye (pupil) is larger than the other.  You twitch or shake violently (convulse).  Your speech is not clear (slurred).  You are more confused, easily angered (agitated), or annoyed than before.  You have more trouble resting than before.  You are unable to recognize people or places.  You have neck pain.  It is difficult to wake you up.  You have unusual behavior changes.  You pass out (lose consciousness). MAKE SURE YOU:   Understand these instructions.  Will watch your condition.  Will get help right away if you are not doing well or get worse. Document Released: 04/26/2009 Document Revised: 09/22/2013 Document Reviewed: 11/28/2012 [facility] Patient Information 2015 [place], [place]. This information is not intended to replace advice given to you by your health care provider. Make sure you discuss any questions you have with your health care provider.

## 2015-01-09 NOTE — ED Provider Notes (Signed)
CSN: [id number]     Arrival date & time 01/09/15  0025 History  This chart was scribed for Everlene Balls, MD by Evelene Croon, ED Scribe. This patient was seen in room B18C/B18C and the patient's care was started 12:50AM.     Chief Complaint  Patient presents with  . Headache   The history is provided by the patient. No language interpreter was used.     HPI Comments:  Jeremiah [name] is a 49 y.o. male who presents to the Emergency Department complaining of worsening HA for the last few days. Pt describes his symptom as a pressure. He notes the HA originally started a few weeks ago after a fall in the bathroom. He states his leg gave way and he landed on his chin.He reports associated mild neck and chin pain. He was not evaluated after the fall. Pt has taken tylenol and advil without relief. He denies vomiting tonight and recent fever.    Past Medical History  Diagnosis Date  . Hypertension   . Rectal fistula   . Dysuria   . Diverticulosis   . Gastroparesis   . Borderline personality disorder   . Major depression   . Schizophrenia   . Hypokalemia   . Microscopic hematuria   . External hemorrhoid   . Over weight   . Nicotine addiction   . Difficulty urinating   . Asthma   . Stroke    Past Surgical History  Procedure Laterality Date  . Lumbar spine surgery    . Colonoscopy    . Colonoscopy with propofol N/A 10/12/2014    Procedure: COLONOSCOPY WITH PROPOFOL;  Surgeon: Hulen Luster, MD;  Location: [facility] ENDOSCOPY;  Service: Gastroenterology;  Laterality: N/A;  . Tonsillectomy     Family History  Problem Relation Age of Onset  . Stroke Mother   . Stroke Maternal Grandmother   . Diabetes Mellitus II Sister    Social History  Substance Use Topics  . Smoking status: Former Smoker    Types: Cigarettes    Quit date: 08/19/2014  . Smokeless tobacco: None  . Alcohol Use: No     Comment: last drink was in march 2016    Review of Systems  A complete 10 system review of systems  was obtained and all systems are negative except as noted in the HPI and PMH.    Allergies  Diflucan; Ciprofloxacin; Milk-related compounds; Pepto-bismol; Trazodone and nefazodone; and Sulfur  Home Medications   Prior to Admission medications   Medication Sig Start Date End Date Taking? Authorizing Provider  ALPRAZolam Duanne Moron) 1 MG tablet Take 1 tablet (1 mg total) by mouth 3 (three) times daily as needed for anxiety. 10/22/14   Benjamine Mola, FNP  amLODipine (NORVASC) 5 MG tablet Take 1 tablet (5 mg total) by mouth daily. 10/22/14   Benjamine Mola, FNP  amoxicillin (AMOXIL) 500 MG capsule Take 1 capsule (500 mg total) by mouth 2 (two) times daily. Patient not taking: Reported on 11/01/2014 10/22/14   Benjamine Mola, FNP  bisacodyl (DULCOLAX) 5 MG EC tablet Take 5 mg by mouth daily as needed for mild constipation or moderate constipation (constipation).     Historical Provider, MD  calcium carbonate (TUMS EX) 750 MG chewable tablet Chew 1 tablet by mouth daily as needed for heartburn (heartburn).    Historical Provider, MD  cloNIDine (CATAPRES) 0.2 MG tablet Take 1 tablet (0.2 mg total) by mouth at bedtime. Patient taking differently: Take 0.2 mg by  mouth 2 (two) times daily.  10/22/14   Benjamine Mola, FNP  divalproex (DEPAKOTE ER) 500 MG 24 hr tablet Take 1 tablet (500 mg total) by mouth at bedtime. Patient not taking: Reported on 12/14/2014 10/22/14   Benjamine Mola, FNP  gabapentin (NEURONTIN) 100 MG capsule Take 2 capsules (200 mg total) by mouth 3 (three) times daily. Patient not taking: Reported on 11/19/2014 10/22/14   Benjamine Mola, FNP  gabapentin (NEURONTIN) 300 MG capsule Take 1 capsule (300 mg total) by mouth at bedtime. Patient not taking: Reported on 11/19/2014 10/22/14   Benjamine Mola, FNP  hydrOXYzine (ATARAX/VISTARIL) 50 MG tablet Take 2 tablets (100 mg total) by mouth at bedtime as needed (sleep). 10/22/14   Benjamine Mola, FNP  magnesium hydroxide (MILK OF MAGNESIA) 400 MG/5ML suspension  Take 30 mLs by mouth daily as needed for mild constipation.    Historical Provider, MD  metoCLOPramide (REGLAN) 5 MG tablet Take 1 tablet (5 mg total) by mouth 3 (three) times daily before meals. Patient not taking: Reported on 12/14/2014 11/26/14   Ahmed Prima, MD  metoprolol succinate (TOPROL-XL) 100 MG 24 hr tablet Take 1 tablet (100 mg total) by mouth daily. Take with or immediately following a meal. 10/22/14   Benjamine Mola, FNP  mirtazapine (REMERON) 45 MG tablet Take 1 tablet (45 mg total) by mouth at bedtime. Patient not taking: Reported on 11/19/2014 10/22/14   Benjamine Mola, FNP  polyethylene glycol (MIRALAX / Floria Raveling) packet Take 17 g by mouth daily as needed for mild constipation (constipation).    Historical Provider, MD  testosterone (ANDROGEL) 50 MG/5GM (1%) GEL Place 5 g onto the skin daily. Patient not taking: Reported on 11/19/2014 10/22/14   Benjamine Mola, FNP  zolpidem (AMBIEN CR) 12.5 MG CR tablet Take 12.5 mg by mouth at bedtime as needed. for sleep 11/19/14   Historical Provider, MD  zolpidem (AMBIEN) 10 MG tablet Take 1 tablet (10 mg total) by mouth at bedtime. 10/22/14 11/21/14  Elyse Jarvis Withrow, FNP   BP 141/97 mmHg  Pulse 56  Temp(Src) 98.1 F (36.7 C) (Oral)  Resp 22  Ht 5\' 6"  (1.676 m)  Wt 155 lb (70.308 kg)  BMI 25.03 kg/m2  SpO2 100% Physical Exam  Constitutional: He is oriented to person, place, and time. Vital signs are normal. He appears well-developed and well-nourished.  Non-toxic appearance. He does not appear ill. No distress.  HENT:  Head: Normocephalic and atraumatic.  Nose: Nose normal.  Mouth/Throat: Oropharynx is clear and moist. No oropharyngeal exudate.  Eyes: Conjunctivae and EOM are normal. Pupils are equal, round, and reactive to light. No scleral icterus.  Neck: Normal range of motion. Neck supple. No tracheal deviation, no edema, no erythema and normal range of motion present. No thyroid mass and no thyromegaly present.  Cardiovascular: Normal  rate, regular rhythm, S1 normal, S2 normal, normal heart sounds, intact distal pulses and normal pulses.  Exam reveals no gallop and no friction rub.   No murmur heard. Pulses:      Radial pulses are 2+ on the right side, and 2+ on the left side.       Dorsalis pedis pulses are 2+ on the right side, and 2+ on the left side.  Pulmonary/Chest: Effort normal and breath sounds normal. No respiratory distress. He has no wheezes. He has no rhonchi. He has no rales.  Abdominal: Soft. Normal appearance and bowel sounds are normal. He exhibits no distension, no  ascites and no mass. There is no hepatosplenomegaly. There is no tenderness. There is no rebound, no guarding and no CVA tenderness.  Musculoskeletal: Normal range of motion. He exhibits no edema or tenderness.  Lymphadenopathy:    He has no cervical adenopathy.  Neurological: He is alert and oriented to person, place, and time. He has normal strength. No cranial nerve deficit or sensory deficit.  Skin: Skin is warm, dry and intact. No petechiae and no rash noted. He is not diaphoretic. No erythema. No pallor.  Psychiatric: He has a normal mood and affect. His behavior is normal. Judgment normal.  Nursing note and vitals reviewed.   ED Course  Procedures   DIAGNOSTIC STUDIES:  Oxygen Saturation is 98% on RA, normal by my interpretation.    COORDINATION OF CARE:  12:54 AM Discussed treatment plan with pt at bedside and pt agreed to plan.  Labs Review Labs Reviewed - No data to display  Imaging Review Ct Head Wo Contrast  01/09/2015   CLINICAL DATA:  Fall 1 week ago with severe headache.  EXAM: CT HEAD WITHOUT CONTRAST  TECHNIQUE: Contiguous axial images were obtained from the base of the skull through the vertex without intravenous contrast.  COMPARISON:  08/20/2014  FINDINGS: Skull and Sinuses:Negative for fracture or destructive process. The mastoids, middle ears, and imaged paranasal sinuses are clear.  Orbits: No acute abnormality.   Brain: No evidence of acute infarction, hemorrhage, hydrocephalus, or mass lesion/mass effect.  IMPRESSION: Normal head CT.   Electronically Signed   By: Monte Fantasia M.D.   On: 01/09/2015 02:41   I have personally reviewed and evaluated these images and lab results as part of my medical decision-making.   EKG Interpretation   Date/Time:  Saturday January 09 2015 00:34:57 EDT Ventricular Rate:  52 PR Interval:  208 QRS Duration: 102 QT Interval:  440 QTC Calculation: 409 R Axis:   50 Text Interpretation:  Sinus bradycardia with 1st degree A-V block  Confirmed by Glynn Octave [phone]) on 01/09/2015 12:38:23 AM      MDM   Final diagnoses:  None   Patient presents to the emergency department for headache. He states this after a fall that occurred one week ago. He was never evaluated. Due to the severity of how he states his headache is, I will obtain CT scan for evaluation. I doubt any serious intercranial pathology. His neurological exam is completely normal. Patient was given Toradol, Reglan, Benadryl for his headache. He is likely suffering from a concussion.  CT scan of his head is negative.  Patient appears well and in no acute distress. His last signs were within his normal limits and is safe for discharge.  I personally performed the services described in this documentation, which was scribed in my presence. The recorded information has been reviewed and is accurate.     Everlene Balls, MD 01/09/15 [phone]

## 2015-01-09 NOTE — ED Notes (Addendum)
Per EMS, pt presents with Headache(head pressure) for weeks, pt states that he had a fall weeks ago. Pt states that he was seen by an orthopedic dr and was told he needed to see a neurologist due to herniated disc suffered from fall. Pt states he has had insomnia. Pt had some difficulty answering questions initially on scene but quickly came around and states that he has been having some confusion with the head pressure. GCS 15, pt ambulatory. EMS VS Pulse 80, 145/70, RR 16, Spo2 100, CBG 122

## 2015-01-09 NOTE — ED Notes (Signed)
Pt verbalized understanding of d/c instructions and no further questions. Pt stable and NAD.  

## 2015-01-16 DIAGNOSIS — I10 Essential (primary) hypertension: Secondary | ICD-10-CM

## 2015-01-16 HISTORY — DX: Essential (primary) hypertension: I10

## 2015-01-18 DIAGNOSIS — M542 Cervicalgia: Secondary | ICD-10-CM | POA: Insufficient documentation

## 2015-01-27 ENCOUNTER — Ambulatory Visit: Payer: Self-pay | Admitting: Urology

## 2015-02-04 ENCOUNTER — Ambulatory Visit: Payer: Self-pay | Admitting: Urology

## 2015-02-09 ENCOUNTER — Emergency Department (HOSPITAL_COMMUNITY)
Admission: EM | Admit: 2015-02-09 | Discharge: 2015-02-09 | Disposition: A | Discharge status: Home / Self Care | Payer: Self-pay | Attending: Emergency Medicine | Admitting: Emergency Medicine

## 2015-02-09 ENCOUNTER — Encounter (HOSPITAL_COMMUNITY): Payer: Self-pay

## 2015-02-09 DIAGNOSIS — F209 Schizophrenia, unspecified: Secondary | ICD-10-CM | POA: Insufficient documentation

## 2015-02-09 DIAGNOSIS — F411 Generalized anxiety disorder: Secondary | ICD-10-CM | POA: Insufficient documentation

## 2015-02-09 DIAGNOSIS — E663 Overweight: Secondary | ICD-10-CM | POA: Insufficient documentation

## 2015-02-09 DIAGNOSIS — Z79899 Other long term (current) drug therapy: Secondary | ICD-10-CM | POA: Insufficient documentation

## 2015-02-09 DIAGNOSIS — Z8719 Personal history of other diseases of the digestive system: Secondary | ICD-10-CM | POA: Insufficient documentation

## 2015-02-09 DIAGNOSIS — F329 Major depressive disorder, single episode, unspecified: Secondary | ICD-10-CM | POA: Insufficient documentation

## 2015-02-09 DIAGNOSIS — I10 Essential (primary) hypertension: Secondary | ICD-10-CM | POA: Insufficient documentation

## 2015-02-09 DIAGNOSIS — F1310 Sedative, hypnotic or anxiolytic abuse, uncomplicated: Secondary | ICD-10-CM | POA: Insufficient documentation

## 2015-02-09 DIAGNOSIS — J45909 Unspecified asthma, uncomplicated: Secondary | ICD-10-CM | POA: Insufficient documentation

## 2015-02-09 DIAGNOSIS — Z87891 Personal history of nicotine dependence: Secondary | ICD-10-CM | POA: Insufficient documentation

## 2015-02-09 DIAGNOSIS — Z8673 Personal history of transient ischemic attack (TIA), and cerebral infarction without residual deficits: Secondary | ICD-10-CM | POA: Insufficient documentation

## 2015-02-09 LAB — COMPREHENSIVE METABOLIC PANEL
ALK PHOS: 61 U/L (ref 38–126)
ALT: 22 U/L (ref 17–63)
AST: 37 U/L (ref 15–41)
Albumin: 4.9 g/dL (ref 3.5–5.0)
Anion gap: 12 (ref 5–15)
BILIRUBIN TOTAL: 1.3 mg/dL — AB (ref 0.3–1.2)
CALCIUM: 9.8 mg/dL (ref 8.9–10.3)
CO2: 20 mmol/L — ABNORMAL LOW (ref 22–32)
Chloride: 94 mmol/L — ABNORMAL LOW (ref 101–111)
Creatinine, Ser: 0.68 mg/dL (ref 0.61–1.24)
GFR calc Af Amer: >60 mL/min (ref >60)
GFR calc non Af Amer: >60 mL/min (ref >60)
Glucose, Bld: 144 mg/dL — ABNORMAL HIGH (ref 65–99)
Potassium: 2.9 mmol/L — ABNORMAL LOW (ref 3.5–5.1)
Sodium: 126 mmol/L — ABNORMAL LOW (ref 135–145)
TOTAL PROTEIN: 7.8 g/dL (ref 6.5–8.1)

## 2015-02-09 LAB — CBC
HEMATOCRIT: 36.7 % — AB (ref 39.0–52.0)
Hemoglobin: 13.5 g/dL (ref 13.0–17.0)
MCH: 29.3 pg (ref 26.0–34.0)
MCHC: 36.8 g/dL — AB (ref 30.0–36.0)
MCV: 79.8 fL (ref 78.0–100.0)
Platelets: 453 10*3/uL — ABNORMAL HIGH (ref 150–400)
RBC: 4.6 MIL/uL (ref 4.22–5.81)
RDW: 12 % (ref 11.5–15.5)
WBC: 10.3 10*3/uL (ref 4.0–10.5)

## 2015-02-09 LAB — ACETAMINOPHEN LEVEL: Acetaminophen (Tylenol), Serum: <10 ug/mL — ABNORMAL LOW (ref 10–30)

## 2015-02-09 LAB — RAPID URINE DRUG SCREEN, HOSP PERFORMED
Amphetamines: NOT DETECTED
BARBITURATES: NOT DETECTED
Benzodiazepines: POSITIVE — AB
Cocaine: NOT DETECTED
Opiates: NOT DETECTED
Tetrahydrocannabinol: NOT DETECTED

## 2015-02-09 LAB — SALICYLATE LEVEL: Salicylate Lvl: <4 mg/dL (ref 2.8–30.0)

## 2015-02-09 LAB — ETHANOL: Alcohol, Ethyl (B): <5 mg/dL (ref <5)

## 2015-02-09 NOTE — ED Provider Notes (Signed)
CSN: [id number]     Arrival date & time 02/09/15  0121 History  This chart was scribed for Jola Schmidt, MD by Helane Gunther, ED Scribe. This patient was seen in room WHALB/WHALB and the patient's care was started at 3:31 AM.    Chief Complaint  Patient presents with  . Manic Behavior   The history is provided by the patient. No language interpreter was used.   HPI Comments:  Jeremiah [name] is a 49 y.o. male who presents to the Emergency Department complaining of manic behavior onset earlier today. He also c/o blue discoloration of his fingertips at 5 PM (resolved after taking a Xanax), inability to sleep more than 2 hours at a time (onset in October of 2015) even with medication prescribed by his PCP, constipation, and HA. He notes that he was seen in the ED for constipation in March when his BP was 209/139. He states he lives with his brother. He reports he is unable to afford his PCP and does not have any means of transportation.  He is not homicidal or suicidal.  He also reports some constipation.  Past Medical History  Diagnosis Date  . Hypertension   . Rectal fistula   . Dysuria   . Diverticulosis   . Gastroparesis   . Borderline personality disorder   . Major depression   . Schizophrenia   . Hypokalemia   . Microscopic hematuria   . External hemorrhoid   . Over weight   . Nicotine addiction   . Difficulty urinating   . Asthma   . Stroke    Past Surgical History  Procedure Laterality Date  . Lumbar spine surgery    . Colonoscopy    . Colonoscopy with propofol N/A 10/12/2014    Procedure: COLONOSCOPY WITH PROPOFOL;  Surgeon: Hulen Luster, MD;  Location: [facility] ENDOSCOPY;  Service: Gastroenterology;  Laterality: N/A;  . Tonsillectomy     Family History  Problem Relation Age of Onset  . Stroke Mother   . Stroke Maternal Grandmother   . Diabetes Mellitus II Sister    Social History  Substance Use Topics  . Smoking status: Former Smoker    Types: Cigarettes    Quit date:  08/19/2014  . Smokeless tobacco: None  . Alcohol Use: No     Comment: last drink was in march 2016    Review of Systems  All other systems reviewed and are negative.  A complete 10 system review of systems was obtained and all systems are negative except as noted in the HPI and PMH.   Allergies  Diflucan; Ciprofloxacin; Milk-related compounds; Pepto-bismol; Trazodone and nefazodone; and Sulfur  Home Medications   Prior to Admission medications   Medication Sig Start Date End Date Taking? Authorizing Provider  ALPRAZolam Duanne Moron) 1 MG tablet Take 1 tablet (1 mg total) by mouth 3 (three) times daily as needed for anxiety. 10/22/14  Yes Benjamine Mola, FNP  amLODipine (NORVASC) 5 MG tablet Take 1 tablet (5 mg total) by mouth daily. 10/22/14  Yes Benjamine Mola, FNP  bisacodyl (DULCOLAX) 5 MG EC tablet Take 5 mg by mouth daily as needed for mild constipation or moderate constipation (constipation).    Yes Historical Provider, MD  cloNIDine (CATAPRES) 0.2 MG tablet Take 1 tablet (0.2 mg total) by mouth at bedtime. Patient taking differently: Take 0.2 mg by mouth 2 (two) times daily.  10/22/14  Yes Benjamine Mola, FNP  divalproex (DEPAKOTE ER) 500 MG 24 hr tablet  Take 1 tablet (500 mg total) by mouth at bedtime. 10/22/14  Yes Benjamine Mola, FNP  hydrOXYzine (ATARAX/VISTARIL) 50 MG tablet Take 2 tablets (100 mg total) by mouth at bedtime as needed (sleep). 10/22/14  Yes Benjamine Mola, FNP  magnesium hydroxide (MILK OF MAGNESIA) 400 MG/5ML suspension Take 30 mLs by mouth daily as needed for mild constipation.   Yes Historical Provider, MD  Melatonin 10 MG TABS Take 10 mg by mouth at bedtime.   Yes Historical Provider, MD  metoprolol succinate (TOPROL-XL) 100 MG 24 hr tablet Take 1 tablet (100 mg total) by mouth daily. Take with or immediately following a meal. Patient taking differently: Take 50 mg by mouth daily. Take with or immediately following a meal. 10/22/14  Yes Benjamine Mola, FNP  sertraline  (ZOLOFT) 100 MG tablet Take 100 mg by mouth daily.   Yes Historical Provider, MD  zolpidem (AMBIEN) 10 MG tablet Take 1 tablet (10 mg total) by mouth at bedtime. 10/22/14 01/09/15  Elyse Jarvis Withrow, FNP   BP 138/96 mmHg  Pulse 104  Temp(Src) 98.2 F (36.8 C) (Oral)  Resp 18  SpO2 100% Physical Exam  Constitutional: He is oriented to person, place, and time. He appears well-developed and well-nourished.  HENT:  Head: Normocephalic.  Eyes: EOM are normal.  Neck: Normal range of motion.  Pulmonary/Chest: Effort normal.  Abdominal: He exhibits no distension.  Musculoskeletal: Normal range of motion.  Neurological: He is alert and oriented to person, place, and time.  Psychiatric: He has a normal mood and affect. Judgment normal.  Nursing note and vitals reviewed.   ED Course  Procedures  DIAGNOSTIC STUDIES: Oxygen Saturation is 100% on RA, normal by my interpretation.    COORDINATION OF CARE: 3:37 AM - Discussed plans to discharge. Will refer to a PCP. Pt advised of plan for treatment and pt agrees.  Labs Review Labs Reviewed  COMPREHENSIVE METABOLIC PANEL - Abnormal; Notable for the following:    Sodium 126 (*)    Potassium 2.9 (*)    Chloride 94 (*)    CO2 20 (*)    Glucose, Bld 144 (*)    BUN <5 (*)    Total Bilirubin 1.3 (*)    All other components within normal limits  ACETAMINOPHEN LEVEL - Abnormal; Notable for the following:    Acetaminophen (Tylenol), Serum <10 (*)    All other components within normal limits  CBC - Abnormal; Notable for the following:    HCT 36.7 (*)    MCHC 36.8 (*)    Platelets 453 (*)    All other components within normal limits  URINE RAPID DRUG SCREEN, HOSP PERFORMED - Abnormal; Notable for the following:    Benzodiazepines POSITIVE (*)    All other components within normal limits  ETHANOL  SALICYLATE LEVEL    Imaging Review No results found. I have personally reviewed and evaluated these images and lab results as part of my medical  decision-making.   EKG Interpretation None      MDM   Final diagnoses:  None    Medical screening examination completed.  No life-threatening emergency.  Discharge home in good condition.  He's been referred to [facility] for ongoing mental health needs.   I personally performed the services described in this documentation, which was scribed in my presence. The recorded information has been reviewed and is accurate.      Jola Schmidt, MD 02/09/15 [phone]

## 2015-02-09 NOTE — Discharge Instructions (Signed)
°Emergency Department Resource Guide °1) Find a Doctor and Pay Out of Pocket °Although you won't have to find out who is covered by your insurance plan, it is a good idea to ask around and get recommendations. You will then need to call the office and see if the doctor you have chosen will accept you as a new patient and what types of options they offer for patients who are self-pay. Some doctors offer discounts or will set up payment plans for their patients who do not have insurance, but you will need to ask so you aren't surprised when you get to your appointment. ° °2) Contact Your Local Health Department °Not all health departments have doctors that can see patients for sick visits, but many do, so it is worth a call to see if yours does. If you don't know where your local health department is, you can check in your phone book. The CDC also has a tool to help you locate your state's health department, and many state websites also have listings of all of their local health departments. ° °3) Find a Walk-in Clinic °If your illness is not likely to be very severe or complicated, you may want to try a walk in clinic. These are popping up all over the country in pharmacies, drugstores, and shopping centers. They're usually staffed by nurse practitioners or physician assistants that have been trained to treat common illnesses and complaints. They're usually fairly quick and inexpensive. However, if you have serious medical issues or chronic medical problems, these are probably not your best option. ° °No Primary Care Doctor: °- Call [facility] at  832-8000 - they can help you locate a primary care doctor that  accepts your insurance, provides certain services, etc. °- Physician Referral Service- 1-800-533-3463 ° °Chronic Pain Problems: °Organization         Address  Phone   Notes  °[facility]  (336) 297-2271 Patients need to be referred by their primary care doctor.  ° °Medication  Assistance: °Organization         Address  Phone   Notes  °[place] [facility] [address] °[place], [place] [address] (336) 641-8030 --Must be a resident of [place] °-- Must have NO insurance coverage whatsoever (no Medicaid/ Medicare, etc.) °-- The pt. MUST have a primary care doctor that directs their care regularly and follows them in the community °  °[facility]  (866) 331-1348   °[employer]  (888) 892-1162   ° °Agencies that provide inexpensive medical care: °Organization         Address  Phone   Notes  °[facility]  (336) 832-8035   °[facility]    (336) 832-7272   °[facility] [address] °[place], [place] [address] (336) 832-4777   °[facility] [address], °[place] (336) 271-4999   °[facility]    (336) 373-0678   °[facility]    (336) 272-1050   °[facility] ° [address], [place] Phone:  (336) 832-4444, Fax:  (336) 832-4440 Hours of Operation:  9 am - 6 pm, M-F.  Also accepts Medicaid/Medicare and self-pay.  °[facility] ° [address], [place] Phone: (336) 832-3150, Fax: (336) 832-3151. Hours of Operation:  8:30 am - 5:30 pm, M-F.  Also accepts Medicaid and self-pay.  °[facility] [place] [address], [place] Phone: (336) 878-6027   °[facility] [address], [place], [place] (336)723-1848, Ext. 123 Mondays & Thursdays: 7-9 AM.  First 15 patients are seen on a first come, first serve basis. °  ° °Medicaid-accepting [place] Providers: ° °Organization         Address  Phone   Notes  °[facility] [address], [place] (336) 641-2100 Also accepts self-pay patients.  °[facility] [address], [place] ° (336) 856-9996   °[facility] [address], [place]  (336) 288-8857   °[facility] [address] [place] [address], [place] (336) 299-7000   °Veita Bland [address], [place]  ° (336) 373-1557 Only accepts [place] Access Medicaid patients after they have their name applied to their card.  ° °Self-Pay (no insurance) in [place]: ° °Organization         Address  Phone   Notes  °Sickle Cell Patients, [facility] [address], [place] (336) 832-1970   °[facility] [address], [place] (336) 832-4400   °[facility] [place] [facility] ° [address], [place] (336) 992-4800   °[facility]/Dr. Osei-Bonsu ° [address] [place] [address], [place] or [address], [place] (336) 841-8500 Phone number for both [place] and [place] locations is the same.  °[facility] [address], [place] (336) 299-0000   °[facility] [address] [place] [address], [place] or [address] (336) 852-7530 °(336) 878-2260   °[facility] [address], [place] (336) 350-1642, phone; (336) 294-5005, fax Sees patients 1st and 3rd Saturday of every month.  Must not qualify for public or private insurance (i.e. Medicaid, Medicare, [place] Health Choice, Veterans' Benefits) • Household income should be no more than 200% of the poverty level •The clinic cannot treat you if you are pregnant or think you are pregnant • Sexually transmitted diseases are not treated at the clinic.  ° ° °Dental Care: °Organization         Address  Phone  Notes  °[place] [facility] [address], [place] (336) 641-6152 Accepts children up to age 21 who are enrolled in Medicaid or [place] Health Choice; pregnant women with a Medicaid card; and children who have applied for Medicaid or [place] Health Choice, but were declined, whose parents can pay a reduced fee at time of service.  °[place]  [facility] [place]  [address], [place] (336) 641-7733 Accepts children up to age 21 who are enrolled in Medicaid or [place] Health Choice; pregnant women with a Medicaid card; and children who have applied for Medicaid or [place] Health Choice, but were declined, whose parents can pay a reduced fee at time of service.  °[facility] ° [address], [place] (336) 641-4533 Patients are seen by appointment only. Walk-ins are not accepted. [facility] will see patients 18 years of age and older. °Monday - Tuesday (8am-5pm) °Most Wednesdays (8:30-5pm) °$30 per visit, cash only  °[facility] ° [address], [place] (336) 641-4533 Patients are seen by appointment only. Walk-ins are not accepted. [facility] will see patients 18 years of age and older. °One   Wednesday Evening (Monthly: Volunteer Based).  $30 per visit, cash only  °[facility]  (919) 537-3737 for adults; Children under age 4, call Graduate Pediatric Dentistry at (919) [phone]. Children aged 4-14, please call (919) [phone] to request a pediatric application. ° Dental services are provided in all areas of dental care including fillings, crowns and bridges, complete and partial dentures, implants, gum treatment, root canals, and extractions. Preventive care is also provided. Treatment is provided to both adults and children. °Patients are selected via a lottery and there is often a waiting list. °  °[facility] [address], °[place] ° (336) 763-8833 [email or web address] °  °[facility] [address], [place], [place] (336)723-1848, Ext. 123 Second and Fourth Thursday of each month, opens at 6:30 AM; Clinic ends at 9 AM.  Patients are seen on a first-come first-served basis, and a limited number are seen during each clinic.  ° °[facility] ° [address], [place], [place] (336) 723-7904    Eligibility Requirements °You must have lived in [place], [place], or [place] counties for at least the last three months. °  You cannot be eligible for state or federal sponsored healthcare insurance, including Veterans Administration, Medicaid, or Medicare. °  You generally cannot be eligible for healthcare insurance through your employer.  °  How to apply: °Eligibility screenings are held every Tuesday and Wednesday afternoon from 1:00 pm until 4:00 pm. You do not need an appointment for the interview!  °[facility] [address], [place], [place] 336-631-2330   °[place] [facility]  336-342-8273   °[facility]  336-703-3100   °[facility]  336-570-6415   ° °Behavioral Health Resources in the Community: °Intensive Outpatient Programs °Organization         Address  Phone  Notes  °[facility] [address], [place], [place] 336-878-6098   °[facility] [address], [place], [place] 336-832-9800   °[facility] [address], [place], [place] ° 336-882-2125   °[facility] [address],  °[place], [place] 1-800-853-5163 or 336-641-4981   °Substance Abuse Resources °Organization         Address  Phone  Notes  °[facility]  336-882-2125   °[facility]  336-784-9470   °[facility]  336-285-9073   °[facility]  336-845-3988   °Residential & Outpatient Substance Abuse Program  1-800-659-3381   °Psychological Services °Organization         Address  Phone  Notes  °[facility]  336- 832-9600   °[facility]  336- 378-7881   °[facility] [address], [place] 1-800-853-5163 or 336-641-4981   ° °Mobile Crisis Teams °Organization         Address  Phone  Notes  °[facility], Mobile Crisis Care Unit  1-877-626-1772   °Assertive °[facility] ° [address]  [place], [place] 336-834-9664   °Sharon DeEsch [address] °[place] 336-554-5454   ° °Self-Help/Support Groups °Organization         Address  Phone             Notes  °[facility] [facility] - variety of support groups  336- 373-1402 Call for more information  °Narcotics Anonymous (NA), [facility] [address], °[place]  2 meetings at this location  ° °  Residential Treatment Programs °Organization         Address  Phone  Notes  °[facility] [address],    °[place]  1-866-801-8205   °[facility] ° [address], [place], [place] 704-293-8524   °[facility] [address], [place] 336-845-3988 Admissions: 8am-3pm M-F  °[facility] [address]    °[place], [place] 336-841-1104   °[facility] [address], [place], [place] 336-379-7146   °[facility] [address]  °[place], [place] 336-285-9073   °[facility] - Intensive Outpatient [address], [place], [place] 336-852-3033   °[facility] [address]  °[place], [place] 1-877-615-2722 or 336-784-9470   °[facility] [address] [place], [place] 336-227-7417 Accepts Medicaid  °[facility] [address]  °[place] 1-800-659-3381 Substance Abuse/Addiction Treatment  ° °[place] Behavioral Health Resources °Organization         Address  Phone  Notes  °[facility]  (888) 581-9988   °Julie Brannon, PhD [address] [place], [place]   (336) 349-5553 or (336) 951-0000   °[facility]   [address] °[place], [place] (336) 349-4454   °[facility] [address], [place], [place] (336) 342-8316 Insurance/Medicaid/sponsorship through [facility]  °[facility] [address]                                    [place], [place] (336) 342-8316 Therapy/tele-psych/case    °[facility] [address]  ° [place],  (336) 349-2233    °Dr. Arfeen  (336) 349-4544   °[facility] [place]  [employer] [place] [facility] 1) [address], [place] °2) [address], [place] °3)  [address], [place] (336) 349-3220 °(336) 342-7768 ° °(336) 342-8140   °[place] [facility] (336) 342-1394 or (336) 342-3537 (After Hours)    ° ° °

## 2015-02-09 NOTE — ED Notes (Signed)
Pt states he hasn't slept in three days and he took a xanax in front of EMS, he lives with his bother or brother in law and he states that he hasn't been taking his meds.

## 2015-02-09 NOTE — ED Notes (Addendum)
Multiple complaints:   -Notice fingertips turning blue earlier today/ yesterday around 5p -Neck discomfort -insomnia -no bowel movement in several weeks  Pt states sx's are better after taking xanax which are prescribed psychiartrist.

## 2015-02-26 ENCOUNTER — Ambulatory Visit (INDEPENDENT_AMBULATORY_CARE_PROVIDER_SITE_OTHER): Payer: Self-pay | Admitting: Urology

## 2015-02-26 ENCOUNTER — Encounter: Payer: Self-pay | Admitting: Urology

## 2015-02-26 VITALS — BP 102/70 | HR 65 | Ht 67.0 in | Wt 155.5 lb

## 2015-02-26 DIAGNOSIS — K5909 Other constipation: Secondary | ICD-10-CM

## 2015-02-26 DIAGNOSIS — N411 Chronic prostatitis: Secondary | ICD-10-CM

## 2015-02-26 MED ORDER — POLYETHYLENE GLYCOL 3350 17 G PO PACK: 17.0000 g | PACK | Freq: Every day | ORAL | Status: DC

## 2015-02-26 MED ORDER — SULFAMETHOXAZOLE-TRIMETHOPRIM 800-160 MG PO TABS: 1.0000 | ORAL_TABLET | Freq: Once | ORAL | Status: DC

## 2015-02-26 NOTE — Progress Notes (Signed)
02/26/2015 10:47 AM   Jeremiah [name] Feb 17, 1966 [id number]  Referring provider: Steele Sizer, MD [address] [place], [place] [address]  Chief Complaint  Patient presents with  . Follow-up    pt complains that since the cystoscopy not able to sleep, dysuria and bladder flank pain.     HPI: Known to [place] with chronic prostatitis think he understands and has his problem but he has so many other problems that are overwhelming him mentally. These are mostly imagines problems. We had institutionalized him before for suicidal ideation.      PMH: Past Medical History  Diagnosis Date  . Hypertension   . Rectal fistula   . Dysuria   . Diverticulosis   . Gastroparesis   . Borderline personality disorder   . Major depression ([place])   . Schizophrenia ([place])   . Hypokalemia   . Microscopic hematuria   . External hemorrhoid   . Over weight   . Nicotine addiction   . Difficulty urinating   . Asthma   . Stroke ([place])   . Benign essential HTN 08/27/2014  . BP (high blood pressure) 01/16/2015    Surgical History: Past Surgical History  Procedure Laterality Date  . Lumbar spine surgery    . Colonoscopy    . Colonoscopy with propofol N/A 10/12/2014    Procedure: COLONOSCOPY WITH PROPOFOL;  Surgeon: Hulen Luster, MD;  [facility];  Service: Gastroenterology;  Laterality: N/A;  . Tonsillectomy      Home Medications:    Medication List       This list is accurate as of: 02/26/15 10:47 AM.  Always use your most recent med list.               ALPRAZolam 1 MG tablet  Commonly known as:  XANAX  Take 1 tablet (1 mg total) by mouth 3 (three) times daily as needed for anxiety.     amLODipine 5 MG tablet  Commonly known as:  NORVASC  Take 1 tablet (5 mg total) by mouth daily.     bisacodyl 5 MG EC tablet  Commonly known as:  DULCOLAX  Take 5 mg by mouth daily as needed for mild constipation or moderate constipation (constipation).     cloNIDine 0.2  MG tablet  Commonly known as:  CATAPRES  Take 1 tablet (0.2 mg total) by mouth at bedtime.     divalproex 500 MG 24 hr tablet  Commonly known as:  DEPAKOTE ER  Take 1 tablet (500 mg total) by mouth at bedtime.     hydrOXYzine 50 MG tablet  Commonly known as:  ATARAX/VISTARIL  Take 2 tablets (100 mg total) by mouth at bedtime as needed (sleep).     magnesium hydroxide 400 MG/5ML suspension  Commonly known as:  MILK OF MAGNESIA  Take 30 mLs by mouth daily as needed for mild constipation.     Melatonin 10 MG Tabs  Take 10 mg by mouth at bedtime.     metoprolol succinate 100 MG 24 hr tablet  Commonly known as:  TOPROL-XL  Take 1 tablet (100 mg total) by mouth daily. Take with or immediately following a meal.     polyethylene glycol packet  Commonly known as:  MIRALAX  Take 17 g by mouth daily.     sertraline 100 MG tablet  Commonly known as:  ZOLOFT  Take 100 mg by mouth daily.     sulfamethoxazole-trimethoprim 800-160 MG tablet  Commonly  known as:  BACTRIM DS,SEPTRA DS  Take 1 tablet by mouth once.     zolpidem 10 MG tablet  Commonly known as:  AMBIEN  Take 1 tablet (10 mg total) by mouth at bedtime.        Allergies:  Allergies  Allergen Reactions  . Diflucan [Fluconazole] Diarrhea  . Ciprofloxacin Other (See Comments)    GI distress  . Milk-Related Compounds Other (See Comments)    GI problems  . Pepto-Bismol [Bismuth Subsalicylate] Other (See Comments)    "Bad reaction"  . Trazodone And Nefazodone Other (See Comments)    Burn tongue and causes blisters  . Sulfur Other (See Comments)    GI distress     Family History: Family History  Problem Relation Age of Onset  . Stroke Mother   . Stroke Maternal Grandmother   . Diabetes Mellitus II Sister     Social History:  reports that he quit smoking about 6 months ago. His smoking use included Cigarettes. He does not have any smokeless tobacco history on file. He reports that he does not drink alcohol or use  illicit drugs.  ROS: UROLOGY Frequent Urination?: No Hard to postpone urination?: No Burning/pain with urination?: No Get up at night to urinate?: No Leakage of urine?: No Urine stream starts and stops?: No Trouble starting stream?: No Do you have to strain to urinate?: No Blood in urine?: No Urinary tract infection?: No Sexually transmitted disease?: No Painful intercourse?: No Weak stream?: No Erection problems?: No Penile pain?: No  Gastrointestinal Nausea?: No Vomiting?: No Indigestion/heartburn?: No Diarrhea?: No Constipation?: Yes  Constitutional Fever: No Night sweats?: No Weight loss?: Yes Fatigue?: Yes  Skin Skin rash/lesions?: No Itching?: No  Eyes Blurred vision?: Yes Double vision?: No  Ears/Nose/Throat Sore throat?: No Sinus problems?: No  Hematologic/Lymphatic Swollen glands?: No Easy bruising?: No  Cardiovascular Leg swelling?: No Chest pain?: No  Respiratory Cough?: No Shortness of breath?: No  Endocrine Excessive thirst?: No  Musculoskeletal Back pain?: No Joint pain?: No  Neurological Headaches?: Yes Dizziness?: No  Psychologic Depression?: No Anxiety?: No  Physical Exam: BP 102/70 mmHg  Pulse 65  Ht 5\' 7"  (1.702 m)  Wt 155 lb 8 oz (70.534 kg)  BMI 24.35 kg/m2  Constitutional:  Alert and oriented, No acute distress. HEENT: [place] AT, moist mucus membranes.  Trachea midline, no masses. Cardiovascular: No clubbing, cyanosis, or edema. Respiratory: Normal respiratory effort, no increased work of breathing. GI: Abdomen is soft, nontender, nondistended, no abdominal masses GU: No CVA tenderness. Chronic prostatitis Skin: No rashes, bruises or suspicious lesions. Lymph: No cervical or inguinal adenopathy. Neurologic: Grossly intact, no focal deficits, moving all 4 extremities. Psychiatric: Normal mood and affect.  Laboratory Data: Lab Results  Component Value Date   WBC 10.3 02/09/2015   HGB 13.5 02/09/2015   HCT  36.7* 02/09/2015   MCV 79.8 02/09/2015   PLT 453* 02/09/2015    Lab Results  Component Value Date   CREATININE 0.68 02/09/2015    No results found for: PSA  Lab Results  Component Value Date   TESTOSTERONE 336* 10/19/2014    Lab Results  Component Value Date   HGBA1C 5.7* 10/20/2014    Urinalysis    Component Value Date/Time   COLORURINE YELLOW* 11/26/2014 1543   COLORURINE Colorless 08/20/2014 2122   APPEARANCEUR CLEAR* 11/26/2014 1543   APPEARANCEUR Clear 08/20/2014 2122   LABSPEC 1.006 11/26/2014 1543   LABSPEC 1.002 08/20/2014 2122   PHURINE 6.0 11/26/2014 1543  PHURINE 7.0 08/20/2014 2122   GLUCOSEU Negative 12/14/2014 1017   GLUCOSEU Negative 08/20/2014 2122   HGBUR 1+* 11/26/2014 1543   HGBUR 2+ 08/20/2014 2122   BILIRUBINUR Negative 12/14/2014 [place] NEGATIVE 11/26/2014 1543   BILIRUBINUR Negative 08/20/2014 2122   [place] 11/26/2014 1543   KETONESUR Negative 08/20/2014 2122   PROTEINUR NEGATIVE 11/26/2014 1543   PROTEINUR Negative 08/20/2014 2122   UROBILINOGEN 0.2 10/15/2014 1816   NITRITE Negative 12/14/2014 1017   NITRITE NEGATIVE 11/26/2014 1543   NITRITE Negative 08/20/2014 2122   LEUKOCYTESUR Negative 12/14/2014 1017   LEUKOCYTESUR NEGATIVE 11/26/2014 1543   LEUKOCYTESUR Negative 08/20/2014 2122    Pertinent Imaging: None  2 renalsAssessment & Plan:  Patient has the urologic complaint chronic prostatitis. He has a confabulation of probably 20 other complaints which I will not take time to go through and this definitely is not taking his and I psychotropic drugs and needs to have a family practitioner see him in organize his healthcare. He has an overwhelming number of problems that are probably nonexistent except in his own monitoring. We will continue to take care of his chronic prostatitis and unfortunately have to listen to this lesion of complaints. Placed him on sulfamethoxazole and will see him again in 6  sulfamethoxazole will be for a two-month period with 1 phenol.  1. Chronic prostatitis Unchanged but patient has not been on any antibiotics for some time. I placed him on sulfamethoxazole today as he has chronic cystitis with urgency frequency dysuria pain on the end of his penis - sulfamethoxazole-trimethoprim (BACTRIM DS,SEPTRA DS) 800-160 MG tablet; Take 1 tablet by mouth once.  Dispense: 60 tablet; Refill: 1  2.ther constipation ordered MiraLAX  - polyethylene glycol (MIRALAX) packet; Take 17 g by mouth daily.  Dispense: 14 each; Refill: 0   No Follow-up on file.  Collier Flowers, [facility] [address], [place], [place] [address] [phone]

## 2015-03-29 ENCOUNTER — Emergency Department
Admission: EM | Admit: 2015-03-29 | Discharge: 2015-03-30 | Disposition: A | Discharge status: Other Acute Inpatient Hospital | Payer: Self-pay | Attending: Emergency Medicine | Admitting: Emergency Medicine

## 2015-03-29 ENCOUNTER — Encounter: Payer: Self-pay | Admitting: Emergency Medicine

## 2015-03-29 DIAGNOSIS — F1310 Sedative, hypnotic or anxiolytic abuse, uncomplicated: Secondary | ICD-10-CM | POA: Insufficient documentation

## 2015-03-29 DIAGNOSIS — Z79899 Other long term (current) drug therapy: Secondary | ICD-10-CM | POA: Insufficient documentation

## 2015-03-29 DIAGNOSIS — Z792 Long term (current) use of antibiotics: Secondary | ICD-10-CM | POA: Insufficient documentation

## 2015-03-29 DIAGNOSIS — Z87891 Personal history of nicotine dependence: Secondary | ICD-10-CM | POA: Insufficient documentation

## 2015-03-29 DIAGNOSIS — K5900 Constipation, unspecified: Secondary | ICD-10-CM | POA: Insufficient documentation

## 2015-03-29 DIAGNOSIS — R4789 Other speech disturbances: Secondary | ICD-10-CM | POA: Insufficient documentation

## 2015-03-29 DIAGNOSIS — I10 Essential (primary) hypertension: Secondary | ICD-10-CM | POA: Insufficient documentation

## 2015-03-29 DIAGNOSIS — F329 Major depressive disorder, single episode, unspecified: Secondary | ICD-10-CM | POA: Insufficient documentation

## 2015-03-29 LAB — CBC
HCT: 39.7 % — ABNORMAL LOW (ref 40.0–52.0)
Hemoglobin: 13.8 g/dL (ref 13.0–18.0)
MCH: 29.7 pg (ref 26.0–34.0)
MCHC: 34.8 g/dL (ref 32.0–36.0)
MCV: 85.3 fL (ref 80.0–100.0)
PLATELETS: 391 10*3/uL (ref 150–440)
RBC: 4.65 MIL/uL (ref 4.40–5.90)
RDW: 13.6 % (ref 11.5–14.5)
WBC: 5.5 10*3/uL (ref 3.8–10.6)

## 2015-03-29 LAB — COMPREHENSIVE METABOLIC PANEL
ALK PHOS: 64 U/L (ref 38–126)
ALT: 21 U/L (ref 17–63)
AST: 20 U/L (ref 15–41)
Albumin: 4.7 g/dL (ref 3.5–5.0)
Anion gap: 7 (ref 5–15)
BILIRUBIN TOTAL: 0.4 mg/dL (ref 0.3–1.2)
BUN: 11 mg/dL (ref 6–20)
CALCIUM: 10.2 mg/dL (ref 8.9–10.3)
CO2: 28 mmol/L (ref 22–32)
CREATININE: 0.8 mg/dL (ref 0.61–1.24)
Chloride: 98 mmol/L — ABNORMAL LOW (ref 101–111)
Glucose, Bld: 106 mg/dL — ABNORMAL HIGH (ref 65–99)
Potassium: 4.4 mmol/L (ref 3.5–5.1)
Sodium: 133 mmol/L — ABNORMAL LOW (ref 135–145)
TOTAL PROTEIN: 7.2 g/dL (ref 6.5–8.1)

## 2015-03-29 LAB — URINE DRUG SCREEN, QUALITATIVE (ARMC ONLY)
Amphetamines, Ur Screen: NOT DETECTED
Barbiturates, Ur Screen: NOT DETECTED
Benzodiazepine, Ur Scrn: POSITIVE — AB
CANNABINOID 50 NG, UR ~~LOC~~: NOT DETECTED
COCAINE METABOLITE, UR ~~LOC~~: NOT DETECTED
MDMA (ECSTASY) UR SCREEN: NOT DETECTED
Methadone Scn, Ur: NOT DETECTED
Opiate, Ur Screen: NOT DETECTED
PHENCYCLIDINE (PCP) UR S: NOT DETECTED
TRICYCLIC, UR SCREEN: NOT DETECTED

## 2015-03-29 LAB — VALPROIC ACID LEVEL: Valproic Acid Lvl: <10 ug/mL — ABNORMAL LOW (ref 50.0–100.0)

## 2015-03-29 LAB — SALICYLATE LEVEL

## 2015-03-29 LAB — ACETAMINOPHEN LEVEL: Acetaminophen (Tylenol), Serum: <10 ug/mL — ABNORMAL LOW (ref 10–30)

## 2015-03-29 LAB — ETHANOL

## 2015-03-29 MED ORDER — DIPHENHYDRAMINE HCL 25 MG PO CAPS
50.0000 mg | ORAL_CAPSULE | Freq: Every day | ORAL | Status: DC
  Administered 2015-03-29: 50 mg via ORAL
  Filled 2015-03-29: qty 2

## 2015-03-29 MED ORDER — ALPRAZOLAM 0.5 MG PO TABS
1.0000 mg | ORAL_TABLET | Freq: Three times a day (TID) | ORAL | Status: DC
  Administered 2015-03-29: 1 mg via ORAL
  Filled 2015-03-29: qty 2

## 2015-03-29 NOTE — ED Notes (Signed)
[facility] is consulting at this time 

## 2015-03-29 NOTE — ED Provider Notes (Signed)
[facility] Emergency Department Provider Note  ____________________________________________  Time seen: Approximately 626 PM  I have reviewed the triage vital signs and the nursing notes.   HISTORY  Chief Complaint Mental Health Problem    HPI Jeremiah [name] is a 49 y.o. male who is presenting with a mental health problem. The patient has multiple complaints. He says he has lost 100 like he points. Also saying that he can't sleep. Sitting is also lost a lot of weight. The patient rambles on about multiple issues including that he does not have a place to stay right now and has been staying with his sister and then the next girlfriend. He does not confirm or deny suicidal ideation. Says he thinks is unsafe for him to stay where he is staying right now because he feels like he is being threatened by other people who is living with. Denying any pain.   Past Medical History  Diagnosis Date  . Hypertension   . Rectal fistula   . Dysuria   . Diverticulosis   . Gastroparesis   . Borderline personality disorder   . Major depression ([place])   . Schizophrenia ([place])   . Hypokalemia   . Microscopic hematuria   . External hemorrhoid   . Over weight   . Nicotine addiction   . Difficulty urinating   . Asthma   . Stroke ([place])   . Benign essential HTN 08/27/2014  . BP (high blood pressure) 01/16/2015    Patient Active Problem List   Diagnosis Date Noted  . Homelessness 03/29/2015  . Chronic constipation 03/29/2015  . 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 CN (constipation) 01/18/2015  . Cervical pain 01/18/2015  . BP (high blood pressure) 01/16/2015  . Suicide threat or attempt 12/14/2014  . Cognitive and behavioral changes 11/22/2014  . Microscopic hematuria 11/22/2014  . Disorder of ejaculation 11/22/2014  . 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 H/O autism spectrum disorder 10/20/2014   . 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 GAD (generalized anxiety disorder) 10/19/2014  . Tooth ache 10/19/2014  . Suicidal ideation   . Benign essential HTN 08/27/2014  . Clinical depression 05/16/2014  . Affective bipolar disorder ([place]) 12/05/2013  . Decreased potassium in the blood 12/05/2013  . 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 Prostatitis 12/05/2013  . 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 Bipolar affective disorder ([place]) 12/05/2013  . Schizophrenia ([place]) 12/05/2013    Past Surgical History  Procedure Laterality Date  . Lumbar spine surgery    . Colonoscopy    . Colonoscopy with propofol N/A 10/12/2014    Procedure: COLONOSCOPY WITH PROPOFOL;  Surgeon: Hulen Luster, MD;  [facility]: [facility] ENDOSCOPY;  Service: Gastroenterology;  Laterality: N/A;  . Tonsillectomy      Current Outpatient Rx  Name  Route  Sig  Dispense  Refill  . ALPRAZolam (XANAX) 1 MG tablet   Oral   Take 1 tablet (1 mg total) by mouth 3 (three) times daily as needed for anxiety.   30 tablet   0   . amLODipine (NORVASC) 5 MG tablet   Oral   Take 1 tablet (5 mg total) by mouth daily.   30 tablet   0   . bisacodyl (DULCOLAX) 5 MG EC tablet   Oral   Take 5 mg by mouth daily as needed for mild constipation or moderate constipation (constipation).          Marland Kitchen  cloNIDine (CATAPRES) 0.2 MG tablet   Oral   Take 1 tablet (0.2 mg total) by mouth at bedtime. Patient not taking: Reported on 02/26/2015   30 tablet   0   . divalproex (DEPAKOTE ER) 500 MG 24 hr tablet   Oral   Take 1 tablet (500 mg total) by mouth at bedtime. Patient not taking: Reported on 02/26/2015   30 tablet   0   . hydrOXYzine (ATARAX/VISTARIL) 50 MG tablet   Oral   Take 2 tablets (100 mg total) by mouth at bedtime as needed (sleep).   60 tablet   0   . magnesium hydroxide (MILK OF MAGNESIA) 400 MG/5ML suspension   Oral   Take 30 mLs by mouth daily as needed for mild  constipation.         . Melatonin 10 MG TABS   Oral   Take 10 mg by mouth at bedtime.         . metoprolol succinate (TOPROL-XL) 100 MG 24 hr tablet   Oral   Take 1 tablet (100 mg total) by mouth daily. Take with or immediately following a meal. Patient taking differently: Take 50 mg by mouth daily. Take with or immediately following a meal.   30 tablet   0   . polyethylene glycol (MIRALAX) packet   Oral   Take 17 g by mouth daily.   14 each   0   . sertraline (ZOLOFT) 100 MG tablet   Oral   Take 100 mg by mouth daily.         Marland Kitchen sulfamethoxazole-trimethoprim (BACTRIM DS,SEPTRA DS) 800-160 MG tablet   Oral   Take 1 tablet by mouth once.   60 tablet   1   . EXPIRED: zolpidem (AMBIEN) 10 MG tablet   Oral   Take 1 tablet (10 mg total) by mouth at bedtime.   30 tablet   0     Allergies Diflucan; Ciprofloxacin; Milk-related compounds; Pepto-bismol; Trazodone and nefazodone; and Sulfur  Family History  Problem Relation Age of Onset  . Stroke Mother   . Stroke Maternal Grandmother   . Diabetes Mellitus II Sister     Social History Social History  Substance Use Topics  . Smoking status: Former Smoker    Types: Cigarettes    Quit date: 08/19/2014  . Smokeless tobacco: None  . Alcohol Use: No     Comment: last drink was in march 2016    Review of Systems Constitutional: No fever/chills Eyes: No visual changes. ENT: No sore throat. Cardiovascular: Denies chest pain. Respiratory: Denies shortness of breath. Gastrointestinal: No abdominal pain.  No nausea, no vomiting.  No diarrhea.  Says he has had intermittent constipation since April.  Genitourinary: Negative for dysuria. Musculoskeletal: Negative for back pain. Skin: Negative for rash. Neurological: Negative for headaches, focal weakness or numbness.  10-point ROS otherwise negative.  ____________________________________________   PHYSICAL EXAM:  VITAL SIGNS: ED Triage Vitals  Enc Vitals  Group     BP 03/29/15 1518 134/98 mmHg     Pulse Rate 03/29/15 1518 79     Resp 03/29/15 1518 18     Temp 03/29/15 1518 98.3 F (36.8 C)     Temp Source 03/29/15 1518 Oral     SpO2 03/29/15 1518 100 %     Weight 03/29/15 1518 155 lb (70.308 kg)     Height 03/29/15 1518 5\' 7"  (1.702 m)     Head Cir --      Peak  Flow --      Pain Score 03/29/15 1519 0     Pain Loc --      Pain Edu? --      Excl. in [place]? --     Constitutional: Alert and oriented. Well appearing and in no acute distress. Eyes: Conjunctivae are normal. PERRL. EOMI. Head: Atraumatic. Nose: No congestion/rhinnorhea. Mouth/Throat: Mucous membranes are moist.  Oropharynx non-erythematous. Neck: No stridor.   Cardiovascular: Normal rate, regular rhythm. Grossly normal heart sounds.  Good peripheral circulation. Respiratory: Normal respiratory effort.  No retractions. Lungs CTAB. Gastrointestinal: Soft and nontender. No distention. No abdominal bruits. No CVA tenderness. Musculoskeletal: No lower extremity tenderness nor edema.  No joint effusions. Neurologic:  Normal speech and language. No gross focal neurologic deficits are appreciated. No gait instability. Skin:  Skin is warm, dry and intact. No rash noted. Psychiatric: Pressured speech and tangential.  ____________________________________________   LABS (all labs ordered are listed, but only abnormal results are displayed)  Labs Reviewed  COMPREHENSIVE METABOLIC PANEL - Abnormal; Notable for the following:    Sodium 133 (*)    Chloride 98 (*)    Glucose, Bld 106 (*)    All other components within normal limits  ACETAMINOPHEN LEVEL - Abnormal; Notable for the following:    Acetaminophen (Tylenol), Serum <10 (*)    All other components within normal limits  CBC - Abnormal; Notable for the following:    HCT 39.7 (*)    All other components within normal limits  URINE DRUG SCREEN, QUALITATIVE ([facility] ONLY) - Abnormal; Notable for the following:    Benzodiazepine,  Ur Scrn POSITIVE (*)    All other components within normal limits  VALPROIC ACID LEVEL - Abnormal; Notable for the following:    Valproic Acid Lvl <10 (*)    All other components within normal limits  ETHANOL  SALICYLATE LEVEL   ____________________________________________  EKG   ____________________________________________  RADIOLOGY   ____________________________________________   PROCEDURES    ____________________________________________   INITIAL IMPRESSION / ASSESSMENT AND PLAN / ED COURSE  Pertinent labs & imaging results that were available during my care of the patient were reviewed by me and considered in my medical decision making (see chart for details).  ----------------------------------------- 6:29 PM on 03/29/2015 -----------------------------------------  Patient has been seen and evaluated by psychiatry and Dr. Weber Cooks is recommending admission. ____________________________________________   FINAL CLINICAL IMPRESSION(S) / ED DIAGNOSES  Depressive disorder.    Orbie Pyo, MD 03/29/15 [id number]

## 2015-03-29 NOTE — ED Notes (Signed)
Pt given meal tray and extra drink.

## 2015-03-29 NOTE — ED Notes (Signed)

## 2015-03-29 NOTE — BH Assessment (Signed)
Assessment Note  Jeremiah [name] is an 49 y.o. male. Pt presented with somatic symptoms thinking he hasn't used the bathroom since April 2016. Pt stated if he eats enough at the hospital than he will be able to use the bathroom. Pt speech was pressured. Pt stated his mood was depressed and his affect was happy. 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 Pt stated he is in his final stages of dying and if people cannot see that they are really stupid. Pt denies having any mental health diagnosis, but his chart states he Major Depression, Borderline Personality Disorder, and Schizophrenia.   Diagnosis:   Past Medical History:  Past Medical History  Diagnosis Date  . Hypertension   . Rectal fistula   . Dysuria   . Diverticulosis   . Gastroparesis   . Borderline personality disorder   . Major depression ([place])   . Schizophrenia ([place])   . Hypokalemia   . Microscopic hematuria   . External hemorrhoid   . Over weight   . Nicotine addiction   . Difficulty urinating   . Asthma   . Stroke ([place])   . Benign essential HTN 08/27/2014  . BP (high blood pressure) 01/16/2015    Past Surgical History  Procedure Laterality Date  . Lumbar spine surgery    . Colonoscopy    . Colonoscopy with propofol N/A 10/12/2014    Procedure: COLONOSCOPY WITH PROPOFOL;  Surgeon: Hulen Luster, MD;  Location: [facility] ENDOSCOPY;  Service: Gastroenterology;  Laterality: N/A;  . Tonsillectomy      Family History:  Family History  Problem Relation Age of Onset  . Stroke Mother   . Stroke Maternal Grandmother   . Diabetes Mellitus II Sister     Social History:  reports that he quit smoking about 7 months ago. His smoking use included Cigarettes. He does not have any smokeless tobacco history on file. He reports that he does not drink alcohol or use illicit drugs.  Additional Social History:  Alcohol / Drug Use Pain Medications:  None reported Prescriptions: none reported Over the Counter: none reported  CIWA: CIWA-Ar BP: (!) 134/98 mmHg Pulse Rate: 79 COWS:    Allergies:  Allergies  Allergen Reactions  . Diflucan [Fluconazole] Diarrhea  . Ciprofloxacin Other (See Comments)    GI distress  . Milk-Related Compounds Other (See Comments)    GI problems  . Pepto-Bismol [Bismuth Subsalicylate] Other (See Comments)    "Bad reaction"  . Trazodone And Nefazodone Other (See Comments)    Burn tongue and causes blisters  . Sulfur Other (See Comments)    GI distress     Home Medications:  (Not in a hospital admission)  OB/GYN Status:  No LMP for male patient.  General Assessment Data Location of Assessment: [facility] ED TTS Assessment: In system Is this a Tele or Face-to-Face Assessment?: Face-to-Face Is this an Initial Assessment or a Re-assessment for this encounter?: Initial Assessment Marital status: Divorced [place] name: n/a Is patient pregnant?: No Pregnancy Status: No Living Arrangements: Other (Comment) (homeless) Can pt return to current living arrangement?: Yes Admission Status: Involuntary Is patient capable of signing voluntary admission?: No Referral Source: Other Insurance type: None     Crisis Care Plan Living Arrangements: Other (Comment) (homeless) Name of Psychiatrist: None Name of Therapist: None  Education Status Is patient currently in school?: No Current Grade: N/a  Highest grade of school patient has completed: 12 Name of school: None  Risk to self with the past 6 months Suicidal Ideation: Yes-Currently Present Has patient been a risk to self within the past 6 months prior to admission? : Yes Suicidal Intent: No Has patient had any suicidal intent within the past 6 months prior to admission? : No Is patient at risk for suicide?: No Suicidal Plan?: No Has patient had any suicidal plan within the past 6 months prior to admission? : No Access to Means: No What has been  your use of drugs/alcohol within the last 12 months?: None reported Previous Attempts/Gestures: No How many times?: 0 Other Self Harm Risks: None reported Triggers for Past Attempts: None known Intentional Self Injurious Behavior: None Family Suicide History: Unable to assess Recent stressful life event(s): Job Loss, Financial Problems, Conflict (Comment) Persecutory voices/beliefs?: Yes Depression: Yes Substance abuse history and/or treatment for substance abuse?: No  Risk to Others within the past 6 months Homicidal Ideation: No Does patient have any lifetime risk of violence toward others beyond the six months prior to admission? : Unknown Thoughts of Harm to Others: No Current Homicidal Intent: No Current Homicidal Plan: No Access to Homicidal Means: No Identified Victim: None reported History of harm to others?: No Assessment of Violence: None Noted Violent Behavior Description: None Does patient have access to weapons?: No Criminal Charges Pending?: No Does patient have a court date: No Is patient on probation?: No  Psychosis Hallucinations: None noted Delusions: Somatic  Mental Status Report Appearance/Hygiene: Bizarre, Body odor, Disheveled, Poor hygiene Eye Contact: Good Motor Activity: Unremarkable Speech: Pressured, Rapid Level of Consciousness: Alert Mood: Depressed Affect: Inconsistent with thought content Anxiety Level: None Thought Processes: Circumstantial Judgement: Impaired Orientation: Person, Place Obsessive Compulsive Thoughts/Behaviors: None  Cognitive Functioning Concentration: Poor Memory: Recent Impaired, Remote Impaired IQ: Average Insight: Poor Impulse Control: Fair Appetite: Good Weight Loss: 0 Weight Gain: 0 Sleep: No Change Total Hours of Sleep: 8 Vegetative Symptoms: None  ADLScreening [facility] Assessment Services) Patient's cognitive ability adequate to safely complete daily activities?: Yes Patient able to express need for  assistance with ADLs?: Yes Independently performs ADLs?: Yes (appropriate for developmental age)  Prior Inpatient Therapy Prior Inpatient Therapy: No  Prior Outpatient Therapy Prior Outpatient Therapy: No Does patient have an ACCT team?: Unknown Does patient have Intensive In-House Services?  : Unknown Does patient have [facility] services? : Unknown Does patient have P4CC services?: Unknown  ADL Screening (condition at time of admission) Patient's cognitive ability adequate to safely complete daily activities?: Yes Patient able to express need for assistance with ADLs?: Yes Independently performs ADLs?: Yes (appropriate for developmental age)       Abuse/Neglect Assessment (Assessment to be complete while patient is alone) Physical Abuse: Denies Verbal Abuse: Denies Sexual Abuse: Denies Exploitation of patient/patient's resources: Denies Self-Neglect: Denies Values / Beliefs Cultural Requests During Hospitalization: None Spiritual Requests During Hospitalization: None   Advance Directives (For Healthcare) Does patient have an advance directive?: No Would patient like information on creating an advanced directive?: Yes - Educational materials given    Additional Information 1:1 In Past 12 Months?: No CIRT Risk: No Elopement Risk: No     Disposition:  Disposition Initial Assessment Completed for this Encounter: Yes Disposition of Patient: Other dispositions Other disposition(s): Other (Comment) (Psych Consult)  On Site Evaluation by:   Reviewed with Physician:    Kara Mead Malon Siddall 03/29/2015 6:25 PM

## 2015-03-29 NOTE — ED Notes (Signed)
Patient states "I've lost 100 IQ points"  Brought in by [employer].  Denies SI/HI.  Denies hallucinations, states "I'm not hearing my inner voice like I should be"  "Everybody hears voices, don't they?  As for seeing things, everyone has an imagination and sees things or else they would be blind."

## 2015-03-29 NOTE — ED Notes (Signed)
Pt. Noted in room. No complaints or concerns voiced. No distress or abnormal behavior noted. Will continue to monitor with security cameras. Q 15 minute rounds continue. 

## 2015-03-29 NOTE — ED Notes (Signed)
ENVIRONMENTAL ASSESSMENT Potentially harmful objects out of patient reach: Yes Personal belongings secured: Yes Patient dressed in hospital provided attire only: Yes Plastic bags out of patient reach: Yes Patient care equipment (cords, cables, call bells, lines, and drains) shortened, removed, or accounted for: Yes Equipment and supplies removed from bottom of stretcher: Yes Potentially toxic materials out of patient reach: Yes Sharps container removed or out of patient reach: Yes  Patient assigned to appropriate care area. Patient oriented to unit/care area: Informed that, for their safety, care areas are designed for safety and monitored by security cameras at all times; and visiting hours explained to patient. Patient verbalizes understanding, and verbal contract for safety obtained.  Meal given. Patient has not showered "in awhile" and has body odor. He was told he could take a shower after he eats.

## 2015-03-29 NOTE — ED Notes (Signed)
He is transferring to ED BHU at this time

## 2015-03-29 NOTE — ED Notes (Signed)
Pt. In shower. 

## 2015-03-29 NOTE — ED Notes (Signed)
Report received from Jeremiah Harris RN. Pt. Alert and oriented in no distress denies SI, HI, AVH and pain.  Pt. Instructed to come to me with problems or concerns.Will continue to monitor for safety via security cameras and Q 15 minute checks. 

## 2015-03-29 NOTE — ED Notes (Signed)
Pt asked and given permission to shower. New scrubs and toiletry items given to use. Understands that he is to give everything back.

## 2015-03-29 NOTE — ED Notes (Signed)
TTS consulting with patient.

## 2015-03-29 NOTE — ED Notes (Signed)
Pt. Noted sleeping in room. No complaints or concerns voiced. No distress or abnormal behavior noted. Will continue to monitor with security cameras. Q 15 minute rounds continue. 

## 2015-03-29 NOTE — ED Notes (Signed)
Patient is refusing to shower, stating he showered "last week." He was encouraged to attend to ADLs. Patient stated he would take a shower in the morning.

## 2015-03-29 NOTE — Consult Note (Signed)
[facility] Face-to-Face Psychiatry Consult   Reason for Consult:  Consult for this 49 year old man with a history of odd behavior multiple somatic symptoms mood instability who is brought to the hospital with complaints that his mood and ability to take care of himself or getting worse. Referring Physician:  schaevitz Patient Identification: Jeremiah [name] MRN:  [id number] Principal Diagnosis: MDD (major depressive disorder), recurrent episode, with atypical features [facility]) Diagnosis:   Patient Active Problem List   Diagnosis Date Noted  . Homelessness [Z59.0] 03/29/2015  . Chronic constipation [K59.00] 03/29/2015  . 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 CN (constipation) [K59.00] 01/18/2015  . Cervical pain [M54.2] 01/18/2015  . BP (high blood pressure) [I10] 01/16/2015  . Suicide threat or attempt [F48.9] 12/14/2014  . Cognitive and behavioral changes [R41.89, F91.9] 11/22/2014  . Microscopic hematuria [R31.29] 11/22/2014  . Disorder of ejaculation [N53.19] 11/22/2014  . 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 H/O autism spectrum disorder [Z86.59] 10/20/2014  . MDD (major depressive disorder), recurrent episode, with atypical features ([place]) [F33.9] 10/19/2014  . GAD (generalized anxiety disorder) [F41.1] 10/19/2014  . Tooth ache [K08.89] 10/19/2014  . Suicidal ideation [R45.851]   . Benign essential HTN [I10] 08/27/2014  . Clinical depression [F32.9] 05/16/2014  . 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 Decreased potassium in the blood [E87.6] 12/05/2013  . 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 Prostatitis [N41.9] 12/05/2013  . 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 Bipolar affective disorder ([place]) [F31.9] 12/05/2013  . Schizophrenia ([place]) [F20.9] 12/05/2013    Total Time spent with patient: 1 hour  Subjective:   Jeremiah [name] is a 49 y.o. male patient  admitted with "things are just getting so much worse for me".  HPI:  Information from the patient and the chart. Chart reviewed. Old notes reviewed. Labs reviewed. Patient was brought in here by EMS at the behest of his family. Staff who interviewed him on first presentation found him bizarre and confusing. When I spoke with the patient he immediately launched into talking about his somatic complaints. He says that he has not been able to sleep at all in months and that his bowels are completely stopped. He says he takes multiple medicines at night to try to help himself sleep none of which works. Also has tried everything for his bowels without success. His mood feels hopeless and overwhelmed. He's been having intrusive thoughts about killing himself. Denies any psychotic symptoms. He says he is still taking Xanax and Benadryl but does not taking Ambien and trazodone that was prescribed for him and isn't on any other psychiatric medicine. He has run out of places to live. He had been staying with his sister but claims that her husband has been threatening to him. He went to his ex-wife's home but apparently she is unable to take care of him and called 911.  Social history: Patient is homeless jobless and out of options. Has burned his bridges with all of his family and friends apparently. Feels unable to work.  Medical history: He has a long-standing history of multiple medical complaints most of which are never found to be diagnosable. He is always complaining of chronic severe constipation and trouble sleeping. Possible high blood pressure for real but no other known medical problems.  Substance abuse history: Patient says he is not drinking and does not use drugs. He says he dabbled in the years ago but patient's gait  completely off of them now for years.  Past Psychiatric History: Patient has a history of multiple presentations for psychiatric treatment always with unclear diagnosis. He has been  diagnosed variously as schizophreniform, bipolar, depressed and the one that I have been attributed and supported wishes autistic spectrum disorder. Sounds a key has misused pills in the past but doesn't admit to ever seriously trying to kill himself. Denies a history of violence. He has had a evaluation at [facility]. He is not sure whether he is ever actually been admitted to a hospital.  Risk to Self: Is patient at risk for suicide?: No Risk to Others:   Prior Inpatient Therapy:   Prior Outpatient Therapy:    Past Medical History:  Past Medical History  Diagnosis Date  . Hypertension   . Rectal fistula   . Dysuria   . Diverticulosis   . Gastroparesis   . Borderline personality disorder   . Major depression ([place])   . Schizophrenia ([place])   . Hypokalemia   . Microscopic hematuria   . External hemorrhoid   . Over weight   . Nicotine addiction   . Difficulty urinating   . Asthma   . Stroke ([place])   . Benign essential HTN 08/27/2014  . BP (high blood pressure) 01/16/2015    Past Surgical History  Procedure Laterality Date  . Lumbar spine surgery    . Colonoscopy    . Colonoscopy with propofol N/A 10/12/2014    Procedure: COLONOSCOPY WITH PROPOFOL;  Surgeon: Hulen Luster, MD;  Location: [facility] ENDOSCOPY;  Service: Gastroenterology;  Laterality: N/A;  . Tonsillectomy     Family History:  Family History  Problem Relation Age of Onset  . Stroke Mother   . Stroke Maternal Grandmother   . Diabetes Mellitus II Sister    Family Psychiatric  History: Patient says that his father also had some kind of disorder that made him sick in the head but he doesn't know any details. Social History:  History  Alcohol Use No    Comment: last drink was in march 2016     History  Drug Use No    Social History   Social History  . Marital Status: Single    Spouse Name: N/A  . Number of Children: N/A  . Years of Education: N/A   Social History Main Topics  . Smoking status: Former Smoker     Types: Cigarettes    Quit date: 08/19/2014  . Smokeless tobacco: None  . Alcohol Use: No     Comment: last drink was in march 2016  . Drug Use: No  . Sexual Activity: Not Asked   Other Topics Concern  . None   Social History Narrative   Additional Social History:    Pain Medications: None reported Prescriptions: none reported Over the Counter: none reported                     Allergies:   Allergies  Allergen Reactions  . Diflucan [Fluconazole] Diarrhea  . Ciprofloxacin Other (See Comments)    GI distress  . Milk-Related Compounds Other (See Comments)    GI problems  . Pepto-Bismol [Bismuth Subsalicylate] Other (See Comments)    "Bad reaction"  . Trazodone And Nefazodone Other (See Comments)    Burn tongue and causes blisters  . Sulfur Other (See Comments)    GI distress     Labs:  Results for orders placed or performed during the hospital encounter of 03/29/15 (  from the past 48 hour(s))  Comprehensive metabolic panel     Status: Abnormal   Collection Time: 03/29/15  3:26 PM  Result Value Ref Range   Sodium 133 (L) 135 - 145 mmol/L   Potassium 4.4 3.5 - 5.1 mmol/L   Chloride 98 (L) 101 - 111 mmol/L   CO2 28 22 - 32 mmol/L   Glucose, Bld 106 (H) 65 - 99 mg/dL   BUN 11 6 - 20 mg/dL   Creatinine, Ser 0.80 0.61 - 1.24 mg/dL   Calcium 10.2 8.9 - 10.3 mg/dL   Total Protein 7.2 6.5 - 8.1 g/dL   Albumin 4.7 3.5 - 5.0 g/dL   AST 20 15 - 41 U/L   ALT 21 17 - 63 U/L   Alkaline Phosphatase 64 38 - 126 U/L   Total Bilirubin 0.4 0.3 - 1.2 mg/dL   GFR calc non Af Amer >60 >60 mL/min   GFR calc Af Amer >60 >60 mL/min    Comment: (NOTE) The eGFR has been calculated using the CKD EPI equation. This calculation has not been validated in all clinical situations. eGFR's persistently <60 mL/min signify possible Chronic Kidney Disease.    Anion gap 7 5 - 15  Ethanol (ETOH)     Status: None   Collection Time: 03/29/15  3:26 PM  Result Value Ref Range   Alcohol,  Ethyl (B) <5 <5 mg/dL    Comment:        LOWEST DETECTABLE LIMIT FOR SERUM ALCOHOL IS 5 mg/dL FOR MEDICAL PURPOSES ONLY   Salicylate level     Status: None   Collection Time: 03/29/15  3:26 PM  Result Value Ref Range   Salicylate Lvl <9.3 2.8 - 30.0 mg/dL  Acetaminophen level     Status: Abnormal   Collection Time: 03/29/15  3:26 PM  Result Value Ref Range   Acetaminophen (Tylenol), Serum <10 (L) 10 - 30 ug/mL    Comment:        THERAPEUTIC CONCENTRATIONS VARY SIGNIFICANTLY. A RANGE OF 10-30 ug/mL MAY BE AN EFFECTIVE CONCENTRATION FOR MANY PATIENTS. HOWEVER, SOME ARE BEST TREATED AT CONCENTRATIONS OUTSIDE THIS RANGE. ACETAMINOPHEN CONCENTRATIONS >150 ug/mL AT 4 HOURS AFTER INGESTION AND >50 ug/mL AT 12 HOURS AFTER INGESTION ARE OFTEN ASSOCIATED WITH TOXIC REACTIONS.   CBC     Status: Abnormal   Collection Time: 03/29/15  3:26 PM  Result Value Ref Range   WBC 5.5 3.8 - 10.6 K/uL   RBC 4.65 4.40 - 5.90 MIL/uL   Hemoglobin 13.8 13.0 - 18.0 g/dL   HCT 39.7 (L) 40.0 - 52.0 %   MCV 85.3 80.0 - 100.0 fL   MCH 29.7 26.0 - 34.0 pg   MCHC 34.8 32.0 - 36.0 g/dL   RDW 13.6 11.5 - 14.5 %   Platelets 391 150 - 440 K/uL  Urine Drug Screen, Qualitative ([facility] only)     Status: Abnormal   Collection Time: 03/29/15  3:26 PM  Result Value Ref Range   Tricyclic, Ur Screen NONE DETECTED NONE DETECTED   Amphetamines, Ur Screen NONE DETECTED NONE DETECTED   MDMA (Ecstasy)Ur Screen NONE DETECTED NONE DETECTED   Cocaine Metabolite,Ur [place] NONE DETECTED NONE DETECTED   Opiate, Ur Screen NONE DETECTED NONE DETECTED   Phencyclidine (PCP) Ur S NONE DETECTED NONE DETECTED   Cannabinoid 50 Ng, Ur [place] NONE DETECTED NONE DETECTED   Barbiturates, Ur Screen NONE DETECTED NONE DETECTED   Benzodiazepine, Ur Scrn POSITIVE (A) NONE DETECTED   Methadone Scn,  Ur NONE DETECTED NONE DETECTED    Comment: (NOTE) 735  Tricyclics, urine               Cutoff 1000 ng/mL 200  Amphetamines, urine             Cutoff  1000 ng/mL 300  MDMA (Ecstasy), urine           Cutoff 500 ng/mL 400  Cocaine Metabolite, urine       Cutoff 300 ng/mL 500  Opiate, urine                   Cutoff 300 ng/mL 600  Phencyclidine (PCP), urine      Cutoff 25 ng/mL 700  Cannabinoid, urine              Cutoff 50 ng/mL 800  Barbiturates, urine             Cutoff 200 ng/mL 900  Benzodiazepine, urine           Cutoff 200 ng/mL 1000 Methadone, urine                Cutoff 300 ng/mL 1100 1200 The urine drug screen provides only a preliminary, unconfirmed 1300 analytical test result and should not be used for non-medical 1400 purposes. Clinical consideration and professional judgment should 1500 be applied to any positive drug screen result due to possible 1600 interfering substances. A more specific alternate chemical method 1700 must be used in order to obtain a confirmed analytical result.  1800 Gas chromato graphy / mass spectrometry (GC/MS) is the preferred 1900 confirmatory method.   Valproic acid level     Status: Abnormal   Collection Time: 03/29/15  4:20 PM  Result Value Ref Range   Valproic Acid Lvl <10 (L) 50.0 - 100.0 ug/mL    Comment: RESULT CONFIRMED BY MANUAL DILUTION    No current facility-administered medications for this encounter.   Current Outpatient Prescriptions  Medication Sig Dispense Refill  . ALPRAZolam (XANAX) 1 MG tablet Take 1 tablet (1 mg total) by mouth 3 (three) times daily as needed for anxiety. 30 tablet 0  . amLODipine (NORVASC) 5 MG tablet Take 1 tablet (5 mg total) by mouth daily. 30 tablet 0  . bisacodyl (DULCOLAX) 5 MG EC tablet Take 5 mg by mouth daily as needed for mild constipation or moderate constipation (constipation).     . cloNIDine (CATAPRES) 0.2 MG tablet Take 1 tablet (0.2 mg total) by mouth at bedtime. (Patient not taking: Reported on 02/26/2015) 30 tablet 0  . divalproex (DEPAKOTE ER) 500 MG 24 hr tablet Take 1 tablet (500 mg total) by mouth at bedtime. (Patient not taking:  Reported on 02/26/2015) 30 tablet 0  . hydrOXYzine (ATARAX/VISTARIL) 50 MG tablet Take 2 tablets (100 mg total) by mouth at bedtime as needed (sleep). 60 tablet 0  . magnesium hydroxide (MILK OF MAGNESIA) 400 MG/5ML suspension Take 30 mLs by mouth daily as needed for mild constipation.    . Melatonin 10 MG TABS Take 10 mg by mouth at bedtime.    . metoprolol succinate (TOPROL-XL) 100 MG 24 hr tablet Take 1 tablet (100 mg total) by mouth daily. Take with or immediately following a meal. (Patient taking differently: Take 50 mg by mouth daily. Take with or immediately following a meal.) 30 tablet 0  . polyethylene glycol (MIRALAX) packet Take 17 g by mouth daily. 14 each 0  . sertraline (ZOLOFT) 100 MG tablet Take 100 mg by mouth  daily.    . sulfamethoxazole-trimethoprim (BACTRIM DS,SEPTRA DS) 800-160 MG tablet Take 1 tablet by mouth once. 60 tablet 1  . zolpidem (AMBIEN) 10 MG tablet Take 1 tablet (10 mg total) by mouth at bedtime. 30 tablet 0    Musculoskeletal: Strength & Muscle Tone: within normal limits Gait & Station: normal Patient leans: N/A  Psychiatric Specialty Exam: Review of Systems  Constitutional: Positive for malaise/fatigue.  HENT: Negative.   Eyes: Negative.   Respiratory: Negative.   Cardiovascular: Negative.   Gastrointestinal: Positive for constipation.  Musculoskeletal: Negative.   Skin: Negative.   Neurological: Negative.   Psychiatric/Behavioral: Positive for depression and suicidal ideas. Negative for hallucinations and substance abuse. The patient is nervous/anxious and has insomnia.     Blood pressure 134/98, pulse 79, temperature 98.3 F (36.8 C), temperature source Oral, resp. rate 18, height '5\' 7"'  (1.702 m), weight 70.308 kg (155 lb), SpO2 100 %.Body mass index is 24.27 kg/(m^2).  General Appearance: Disheveled  Eye Contact::  Good  Speech:  Clear and Coherent  Volume:  Normal  Mood:  Depressed  Affect:  Congruent  Thought Process:  Tangential   Orientation:  Full (Time, Place, and Person)  Thought Content:  Obsessions, Paranoid Ideation and Rumination  Suicidal Thoughts:  Yes.  with intent/plan  Homicidal Thoughts:  No  Memory:  Immediate;   Good Recent;   Fair Remote;   Fair  Judgement:  Impaired  Insight:  Lacking  Psychomotor Activity:  Decreased  Concentration:  Fair  Recall:  [employer] of Knowledge:Fair  Language: Fair  Akathisia:  No  Handed:  Right  AIMS (if indicated):     Assets:  Communication Skills Desire for Improvement Social Support  ADL's:  Intact  Cognition: WNL  Sleep:      Treatment Plan Summary: Daily contact with patient to assess and evaluate symptoms and progress in treatment, Medication management and Plan 49 year old man who presents pretty desperate with suicidal thoughts and chronic depression. Can't sleep. Multiple somatic complaints. No safe place to stay. Most importantly he is stating suicidal ideation with thoughts of actually killing himself. He will be admitted to the psychiatric hospital. Continue Xanax the way he says he is prescribed it as well as Benadryl at night although not at the full dose he reports. No other specific medications indicated. Supportive counseling with the patient. Labs reviewed. Patient is under commitment. He can be admitted to the psychiatry ward.  Disposition: Recommend psychiatric Inpatient admission when medically cleared. Supportive therapy provided about ongoing stressors.  Raisha Brabender 03/29/2015 6:21 PM

## 2015-03-30 ENCOUNTER — Encounter: Payer: Self-pay | Admitting: *Deleted

## 2015-03-30 ENCOUNTER — Inpatient Hospital Stay
Admit: 2015-03-30 | Discharge: 2015-05-05 | DRG: 885 | Disposition: A | Discharge status: Other Acute Inpatient Hospital | Payer: No Typology Code available for payment source | Source: Intra-hospital | Attending: Psychiatry | Admitting: Psychiatry

## 2015-03-30 DIAGNOSIS — F411 Generalized anxiety disorder: Secondary | ICD-10-CM | POA: Diagnosis present

## 2015-03-30 DIAGNOSIS — Z87891 Personal history of nicotine dependence: Secondary | ICD-10-CM | POA: Diagnosis not present

## 2015-03-30 DIAGNOSIS — F333 Major depressive disorder, recurrent, severe with psychotic symptoms: Principal | ICD-10-CM

## 2015-03-30 DIAGNOSIS — Z8673 Personal history of transient ischemic attack (TIA), and cerebral infarction without residual deficits: Secondary | ICD-10-CM

## 2015-03-30 DIAGNOSIS — F840 Autistic disorder: Secondary | ICD-10-CM | POA: Diagnosis present

## 2015-03-30 DIAGNOSIS — E538 Deficiency of other specified B group vitamins: Secondary | ICD-10-CM

## 2015-03-30 DIAGNOSIS — Z823 Family history of stroke: Secondary | ICD-10-CM

## 2015-03-30 DIAGNOSIS — E8881 Metabolic syndrome: Secondary | ICD-10-CM | POA: Diagnosis present

## 2015-03-30 DIAGNOSIS — G4700 Insomnia, unspecified: Secondary | ICD-10-CM | POA: Diagnosis present

## 2015-03-30 DIAGNOSIS — Z87820 Personal history of traumatic brain injury: Secondary | ICD-10-CM

## 2015-03-30 DIAGNOSIS — N390 Urinary tract infection, site not specified: Secondary | ICD-10-CM | POA: Diagnosis present

## 2015-03-30 DIAGNOSIS — I10 Essential (primary) hypertension: Secondary | ICD-10-CM | POA: Diagnosis present

## 2015-03-30 DIAGNOSIS — Z9114 Patient's other noncompliance with medication regimen: Secondary | ICD-10-CM

## 2015-03-30 DIAGNOSIS — Z9889 Other specified postprocedural states: Secondary | ICD-10-CM

## 2015-03-30 DIAGNOSIS — Z590 Homelessness: Secondary | ICD-10-CM

## 2015-03-30 DIAGNOSIS — F609 Personality disorder, unspecified: Secondary | ICD-10-CM | POA: Diagnosis present

## 2015-03-30 DIAGNOSIS — H539 Unspecified visual disturbance: Secondary | ICD-10-CM

## 2015-03-30 DIAGNOSIS — R519 Headache, unspecified: Secondary | ICD-10-CM

## 2015-03-30 DIAGNOSIS — Z888 Allergy status to other drugs, medicaments and biological substances status: Secondary | ICD-10-CM | POA: Diagnosis not present

## 2015-03-30 DIAGNOSIS — Z882 Allergy status to sulfonamides status: Secondary | ICD-10-CM | POA: Diagnosis not present

## 2015-03-30 DIAGNOSIS — F319 Bipolar disorder, unspecified: Secondary | ICD-10-CM | POA: Diagnosis present

## 2015-03-30 DIAGNOSIS — F0390 Unspecified dementia without behavioral disturbance: Secondary | ICD-10-CM | POA: Diagnosis present

## 2015-03-30 DIAGNOSIS — R197 Diarrhea, unspecified: Secondary | ICD-10-CM | POA: Diagnosis present

## 2015-03-30 DIAGNOSIS — F450 Somatization disorder: Secondary | ICD-10-CM

## 2015-03-30 DIAGNOSIS — E291 Testicular hypofunction: Secondary | ICD-10-CM | POA: Diagnosis present

## 2015-03-30 DIAGNOSIS — R4189 Other symptoms and signs involving cognitive functions and awareness: Secondary | ICD-10-CM

## 2015-03-30 DIAGNOSIS — K5900 Constipation, unspecified: Secondary | ICD-10-CM | POA: Diagnosis present

## 2015-03-30 DIAGNOSIS — Z79899 Other long term (current) drug therapy: Secondary | ICD-10-CM | POA: Diagnosis not present

## 2015-03-30 DIAGNOSIS — R51 Headache: Secondary | ICD-10-CM

## 2015-03-30 DIAGNOSIS — R45851 Suicidal ideations: Secondary | ICD-10-CM | POA: Diagnosis present

## 2015-03-30 DIAGNOSIS — Z833 Family history of diabetes mellitus: Secondary | ICD-10-CM | POA: Diagnosis not present

## 2015-03-30 DIAGNOSIS — F1320 Sedative, hypnotic or anxiolytic dependence, uncomplicated: Secondary | ICD-10-CM | POA: Diagnosis present

## 2015-03-30 HISTORY — DX: Anxiety disorder, unspecified: F41.9

## 2015-03-30 LAB — URINALYSIS COMPLETE WITH MICROSCOPIC (ARMC ONLY)
Bilirubin Urine: NEGATIVE
Glucose, UA: NEGATIVE mg/dL
Ketones, ur: NEGATIVE mg/dL
Leukocytes, UA: NEGATIVE
Nitrite: NEGATIVE
PH: 8 (ref 5.0–8.0)
PROTEIN: NEGATIVE mg/dL
SQUAMOUS EPITHELIAL / LPF: NONE SEEN
Specific Gravity, Urine: 1.006 (ref 1.005–1.030)

## 2015-03-30 LAB — LIPID PANEL
CHOL/HDL RATIO: 3.8 ratio
Cholesterol: 166 mg/dL (ref 0–200)
HDL: 44 mg/dL (ref >40)
LDL CALC: 105 mg/dL — AB (ref 0–99)
TRIGLYCERIDES: 84 mg/dL (ref <150)
VLDL: 17 mg/dL (ref 0–40)

## 2015-03-30 LAB — TSH: TSH: 1.061 u[IU]/mL (ref 0.350–4.500)

## 2015-03-30 MED ORDER — MAGNESIUM HYDROXIDE 400 MG/5ML PO SUSP: 30.0000 mL | Freq: Every day | ORAL | Status: DC | PRN

## 2015-03-30 MED ORDER — AMLODIPINE BESYLATE 5 MG PO TABS
5.0000 mg | ORAL_TABLET | Freq: Every day | ORAL | Status: DC
  Administered 2015-04-02 – 2015-04-16 (×15): 5 mg via ORAL
  Filled 2015-03-30 (×17): qty 1

## 2015-03-30 MED ORDER — ALPRAZOLAM 1 MG PO TABS
1.0000 mg | ORAL_TABLET | Freq: Three times a day (TID) | ORAL | Status: DC
  Administered 2015-03-30 – 2015-04-02 (×12): 1 mg via ORAL
  Filled 2015-03-30 (×12): qty 1

## 2015-03-30 MED ORDER — POLYETHYLENE GLYCOL 3350 17 G PO PACK
17.0000 g | PACK | Freq: Every day | ORAL | Status: DC
  Administered 2015-03-30 – 2015-04-28 (×30): 17 g via ORAL
  Filled 2015-03-30 (×31): qty 1

## 2015-03-30 MED ORDER — CLONIDINE HCL 0.1 MG PO TABS
ORAL_TABLET | ORAL | Status: AC
  Filled 2015-03-30: qty 2

## 2015-03-30 MED ORDER — ALUM & MAG HYDROXIDE-SIMETH 200-200-20 MG/5ML PO SUSP
30.0000 mL | ORAL | Status: DC | PRN
  Administered 2015-05-01: 30 mL via ORAL
  Filled 2015-03-30: qty 30

## 2015-03-30 MED ORDER — AMLODIPINE BESYLATE 5 MG PO TABS
5.0000 mg | ORAL_TABLET | Freq: Once | ORAL | Status: AC
  Administered 2015-03-30: 5 mg via ORAL

## 2015-03-30 MED ORDER — CLONIDINE HCL 0.1 MG PO TABS: 0.2000 mg | ORAL_TABLET | Freq: Two times a day (BID) | ORAL | Status: DC

## 2015-03-30 MED ORDER — ACETAMINOPHEN 325 MG PO TABS
650.0000 mg | ORAL_TABLET | Freq: Four times a day (QID) | ORAL | Status: DC | PRN
  Administered 2015-04-05 – 2015-04-17 (×5): 650 mg via ORAL
  Filled 2015-03-30 (×7): qty 2

## 2015-03-30 MED ORDER — AMLODIPINE BESYLATE 5 MG PO TABS
ORAL_TABLET | ORAL | Status: AC
  Administered 2015-03-30: 5 mg via ORAL
  Filled 2015-03-30: qty 1

## 2015-03-30 MED ORDER — METOPROLOL SUCCINATE ER 25 MG PO TB24
100.0000 mg | ORAL_TABLET | Freq: Every day | ORAL | Status: DC
  Administered 2015-04-02 – 2015-04-11 (×9): 100 mg via ORAL
  Filled 2015-03-30 (×14): qty 4

## 2015-03-30 MED ORDER — METOPROLOL TARTRATE 25 MG PO TABS: 100.0000 mg | ORAL_TABLET | Freq: Once | ORAL | Status: DC

## 2015-03-30 MED ORDER — MAGNESIUM CITRATE PO SOLN
1.0000 | Freq: Once | ORAL | Status: AC
  Administered 2015-03-30: 1 via ORAL
  Filled 2015-03-30: qty 296

## 2015-03-30 MED ORDER — DIVALPROEX SODIUM 500 MG PO DR TAB
500.0000 mg | DELAYED_RELEASE_TABLET | Freq: Three times a day (TID) | ORAL | Status: DC
  Filled 2015-03-30: qty 1

## 2015-03-30 MED ORDER — ARIPIPRAZOLE 5 MG PO TABS
5.0000 mg | ORAL_TABLET | Freq: Every day | ORAL | Status: DC
  Administered 2015-03-30 – 2015-03-31 (×2): 5 mg via ORAL
  Filled 2015-03-30 (×2): qty 1

## 2015-03-30 MED ORDER — TEMAZEPAM 15 MG PO CAPS
30.0000 mg | ORAL_CAPSULE | Freq: Every evening | ORAL | Status: DC | PRN
  Administered 2015-03-30 – 2015-04-27 (×28): 30 mg via ORAL
  Filled 2015-03-30 (×28): qty 2

## 2015-03-30 MED ORDER — DIPHENHYDRAMINE HCL 25 MG PO CAPS
50.0000 mg | ORAL_CAPSULE | Freq: Every day | ORAL | Status: DC
  Administered 2015-03-30 – 2015-04-27 (×29): 50 mg via ORAL
  Filled 2015-03-30 (×30): qty 2

## 2015-03-30 MED ORDER — SERTRALINE HCL 25 MG PO TABS
50.0000 mg | ORAL_TABLET | Freq: Every day | ORAL | Status: DC
  Filled 2015-03-30: qty 2

## 2015-03-30 MED ORDER — DOCUSATE SODIUM 100 MG PO CAPS
200.0000 mg | ORAL_CAPSULE | Freq: Two times a day (BID) | ORAL | Status: DC
  Administered 2015-03-30 – 2015-05-05 (×62): 200 mg via ORAL
  Filled 2015-03-30 (×73): qty 2

## 2015-03-30 MED ORDER — METOPROLOL SUCCINATE ER 50 MG PO TB24
ORAL_TABLET | ORAL | Status: AC
  Administered 2015-03-30: 100 mg
  Filled 2015-03-30: qty 2

## 2015-03-30 MED ORDER — METOPROLOL SUCCINATE ER 50 MG PO TB24
100.0000 mg | ORAL_TABLET | Freq: Every day | ORAL | Status: DC
Start: 2015-03-30 — End: 2015-03-30

## 2015-03-30 MED ORDER — FLUOXETINE HCL 20 MG PO CAPS
20.0000 mg | ORAL_CAPSULE | Freq: Every day | ORAL | Status: DC
  Administered 2015-03-30 – 2015-04-02 (×4): 20 mg via ORAL
  Filled 2015-03-30 (×4): qty 1

## 2015-03-30 MED ORDER — CLONIDINE HCL 0.1 MG PO TABS
0.2000 mg | ORAL_TABLET | Freq: Two times a day (BID) | ORAL | Status: DC
  Administered 2015-03-30 – 2015-04-06 (×9): 0.2 mg via ORAL
  Filled 2015-03-30 (×16): qty 2

## 2015-03-30 NOTE — BHH Suicide Risk Assessment (Signed)
[facility] Admission Suicide Risk Assessment   Nursing information obtained from:  Patient Demographic factors:  Male, Low socioeconomic status, Unemployed, Caucasian Current Mental Status:  NA Loss Factors:  Decline in physical health Historical Factors:  NA Risk Reduction Factors:  NA Total Time spent with patient: 1 hour Principal Problem: <principal problem not specified> Diagnosis:   Patient Active Problem List   Diagnosis Date Noted  . Major depression ([place]) [F32.9] 03/30/2015  . Homelessness [Z59.0] 03/29/2015  . Chronic constipation [K59.00] 03/29/2015  . 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 CN (constipation) [K59.00] 01/18/2015  . Cervical pain [M54.2] 01/18/2015  . BP (high blood pressure) [I10] 01/16/2015  . Suicide threat or attempt [F48.9] 12/14/2014  . Cognitive and behavioral changes [R41.89, F91.9] 11/22/2014  . Microscopic hematuria [R31.29] 11/22/2014  . Disorder of ejaculation [N53.19] 11/22/2014  . 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 H/O autism spectrum disorder [Z86.59] 10/20/2014  . MDD (major depressive disorder), recurrent episode, with atypical features ([place]) [F33.9] 10/19/2014  . GAD (generalized anxiety disorder) [F41.1] 10/19/2014  . Tooth ache [K08.89] 10/19/2014  . Suicidal ideation [R45.851]   . Benign essential HTN [I10] 08/27/2014  . Clinical depression [F32.9] 05/16/2014  . 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 Decreased potassium in the blood [E87.6] 12/05/2013  . 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 Prostatitis [N41.9] 12/05/2013  . 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 Bipolar affective disorder ([place]) [F31.9] 12/05/2013  . Schizophrenia ([place]) [F20.9] 12/05/2013     Continued Clinical Symptoms:  Alcohol Use Disorder Identification Test Final Score (AUDIT): 0 The "Alcohol Use Disorders Identification Test",  Guidelines for Use in Primary Care, Second Edition.  World Pharmacologist [facility]). Score between 0-7:  no or low risk or alcohol related problems. Score between 8-15:  moderate risk of alcohol related problems. Score between 16-19:  high risk of alcohol related problems. Score 20 or above:  warrants further diagnostic evaluation for alcohol dependence and treatment.   CLINICAL FACTORS:   Depression:   Insomnia Severe   Musculoskeletal: Strength & Muscle Tone: within normal limits Gait & Station: normal Patient leans: N/A  Psychiatric Specialty Exam: I reviewed physical exam performed in the emergency room with the findings. Physical Exam  Nursing note and vitals reviewed.   Review of Systems  Gastrointestinal: Positive for constipation.  Psychiatric/Behavioral: The patient has insomnia.   All other systems reviewed and are negative.   Blood pressure 137/96, pulse 72, temperature 98 F (36.7 C), temperature source Oral, resp. rate 20, height 5\' 7"  (1.702 m), weight 67.132 kg (148 lb), SpO2 99 %.Body mass index is 23.17 kg/(m^2).  General Appearance: Disheveled  Eye Sport and exercise psychologist::  Fair  Speech:  Pressured  Volume:  Normal  Mood:  Anxious, Dysphoric and Hopeless  Affect:  Labile  Thought Process:  Disorganized  Orientation:  Full (Time, Place, and Person)  Thought Content:  Delusions and Paranoid Ideation  Suicidal Thoughts:  Yes.  with intent/plan  Homicidal Thoughts:  No  Memory:  Immediate;   Fair Recent;   Fair Remote;   Fair  Judgement:  Poor  Insight:  Lacking  Psychomotor Activity:  Normal  Concentration:  Fair  Recall:  [employer] of Knowledge:Fair  Language: Fair  Akathisia:  No  Handed:  Right  AIMS (if indicated):     Assets:  Communication Skills Desire for Improvement Resilience  Sleep:     Cognition: WNL  ADL's:  Intact     COGNITIVE FEATURES THAT CONTRIBUTE TO RISK:  None    SUICIDE RISK:   Moderate:  Frequent suicidal ideation with limited  intensity, and duration, some specificity in terms of plans, no associated intent, good self-control, limited dysphoria/symptomatology, some risk factors present, and identifiable protective factors, including available and accessible social support.  PLAN OF CARE: Hospital admission, medication management, discharge planning.  Medical Decision Making:  New problem, with additional work up planned, Review of Psycho-Social Stressors (1), Review or order clinical lab tests (1), Review of Medication Regimen & Side Effects (2) and Review of New Medication or Change in Dosage (2)   Jeremiah [name] is a 49 year old male with a history of depression and psychosis admitting floridly psychotic and depressed in the context of medication noncompliance and severe social stressors.  1. Suicidal ideation. He is able to contract for safety in the hospital.  2. Mood. The patient has been treated in the past with a combination of Depakote and Zoloft. Depakote level was low on admission we will restart Depakote for mood stabilization and Zoloft for depression.  3. Anxiety. The patient reportedly is prescribed Xanax. This was restarted in the emergency room.  4. Hypertension. We restarted metoprolol  and Norvasc.  5. Constipation. It is unclear if this is delusional. Seems like constant complaint. Will start bowel regimen.  6. Insomnia. The patient claims not to be able to sleep for months. Will offer Restoril.   7.  Disposition. To be established. He is homeless and uninsured, and without any support.   I certify that inpatient services furnished can reasonably be expected to improve the patient's condition.   Jeremiah [name] 03/30/2015, 3:01 PM

## 2015-03-30 NOTE — ED Notes (Signed)
Pt. Noted sleeping in room. No complaints or concerns voiced. No distress or abnormal behavior noted. Will continue to monitor with security cameras. Q 15 minute rounds continue. 

## 2015-03-30 NOTE — ED Notes (Signed)
BEHAVIORAL HEALTH ROUNDING Patient sleeping: No. Patient alert and oriented: yes Behavior appropriate: Yes.  ; If no, describe:  Nutrition and fluids offered: Yes  Toileting and hygiene offered: Yes  Sitter present:no Law enforcement present: yes 

## 2015-03-30 NOTE — BHH Group Notes (Signed)
[facility] LCSW Group Therapy  03/30/2015 1:47 PM  Type of Therapy:  Group Therapy  Participation Level:  Did Not Attend   Keene Breath, MSW, LCSWA 03/30/2015, 1:47 PM

## 2015-03-30 NOTE — Progress Notes (Signed)
Recreation Therapy Notes  Date: 11.08.16 Time: 3:00 pm Location: Craft Room  Group Topic: Goal Setting  Goal Area(s) Addresses:  Patient will write down at least one goal. Patient will write down at least one obstacle.  Behavioral Response: Did not attend  Intervention: Recovery Goal Chart  Activity: Patients were instructed to make a Recovery Goal Chart with goals, obstacles, the date they started working on their goals, and the date they achieved their goals.   Education: LRT educated patients on healthy ways they can celebrate reaching their goals.  Education Outcome: Patient did not attend group.  Clinical Observations/Feedback: Patient did not attend group.  Leonette Monarch, LRT/CTRS 03/30/2015 4:07 PM

## 2015-03-30 NOTE — ED Provider Notes (Signed)
-----------------------------------------   7:59 AM on 03/30/2015 -----------------------------------------   Blood pressure 143/110, pulse 98, temperature 98.2 F (36.8 C), temperature source Oral, resp. rate 18, height 5\' 7"  (1.702 m), weight 155 lb (70.308 kg), SpO2 100 %.  The patient had no acute events since last update.  Calm and cooperative at this time.  Disposition is pending per Psychiatry/Behavioral Medicine team recommendations.     Schuyler Amor, MD 03/30/15 [phone]

## 2015-03-30 NOTE — ED Notes (Signed)

## 2015-03-30 NOTE — H&P (Addendum)
Psychiatric Admission Assessment Adult  Patient Identification: Jeremiah [name] MRN:  [id number] Date of Evaluation:  03/30/2015 Chief Complaint:  major depressive disorder Principal Diagnosis: <principal problem not specified> Diagnosis:   Patient Active Problem List   Diagnosis Date Noted  . Major depression ([place]) [F32.9] 03/30/2015  . Homelessness [Z59.0] 03/29/2015  . Chronic constipation [K59.00] 03/29/2015  . 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 CN (constipation) [K59.00] 01/18/2015  . Cervical pain [M54.2] 01/18/2015  . BP (high blood pressure) [I10] 01/16/2015  . Suicide threat or attempt [F48.9] 12/14/2014  . Cognitive and behavioral changes [R41.89, F91.9] 11/22/2014  . Microscopic hematuria [R31.29] 11/22/2014  . Disorder of ejaculation [N53.19] 11/22/2014  . 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 H/O autism spectrum disorder [Z86.59] 10/20/2014  . 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 GAD (generalized anxiety disorder) [F41.1] 10/19/2014  . Tooth ache [K08.89] 10/19/2014  . Suicidal ideation [R45.851]   . Benign essential HTN [I10] 08/27/2014  . Clinical depression [F32.9] 05/16/2014  . 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 Decreased potassium in the blood [E87.6] 12/05/2013  . 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 Prostatitis [N41.9] 12/05/2013  . 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 Bipolar affective disorder ([place]) [F31.9] 12/05/2013  . Schizophrenia ([place]) [F20.9] 12/05/2013   History of Present Illness:  Identifying data. Mr. Siebenaler is a 49 year old male with history of depression and mood instability.  Chief complaint."I'm dying."  History of present illness. Information was obtained from the patient's chart. At the patient has a long history of depression and  mood instability. He carries multiple diagnoses of depression, schizophrenia, bipolar disorder, autistic spectrum disorder, and anxiety disorder. The patient is not a good historian and rather disorganized today unable to provide much information apparently she stopped taking all his medications except for the Xanax and became increasingly depressed and disorganized, unable to care for himself. He became homeless as he burned many bridges. He no longer could stay with a family member and try to stay with his ex-wife who called the police. The patient is focused on his somatic complaints mostly constipation and insomnia. He claims that he did not have a bowel movement or slept in months. He reports many depressive symptoms with poor sleep, decreased appetite with weight loss, anhedonia, feeling of guilt and hopelessness worthlessness, poor energy and concentration social isolation and persistent intrusive thoughts of hurting himself.He did not specify the plan. He denies psychotic symptoms most likely is delusional with somatic delusions. He reports heightened anxiety in his opinion is helped by Xanax only. He denies symptoms suggestive of bipolar mania. He denies alcohol or illicit substance use. His drug screen was positive for benzodiazepines only.  Past psychiatric history. As above he carries multiple diagnoses. He was hospitalized at [facility] at least twice before. The patient is not a good historian and unable to give me any details of his medication history. She denies ever attempting suicide. Upon further questioning the patient has all his problems started in October 2015 when he suffered severe head injury that led to depression, mood instability, cognitive decline. He used to be evaluated regarding his high school but now has difficulties remembering everyday stuff. He was diagnosed with dementia praecox. He also believes that all his somatic problems constipation and urinary tract infection and  insomnia targeted at the same time.  Family psychiatric history. None reported.  Social history. He was married twice. He did well in school. He used to work as a Electronics engineer. He became dysfunctional following head injury. He is homeless, uninsured, with no social support. He applied for disability but was turned down and called a Risk manager.  Total Time spent with patient: 1 hour  Past Psychiatric History: Depression, psychosis, mood instability.  Risk to Self: Is patient at risk for suicide?: No Risk to Others:   Prior Inpatient Therapy:   Prior Outpatient Therapy:    Alcohol Screening: 1. How often do you have a drink containing alcohol?: Never 2. How many drinks containing alcohol do you have on a typical day when you are drinking?: 1 or 2 3. How often do you have six or more drinks on one occasion?: Never Preliminary Score: 0 4. How often during the last year have you found that you were not able to stop drinking once you had started?: Never 5. How often during the last year have you failed to do what was normally expected from you becasue of drinking?: Never 6. How often during the last year have you needed a first drink in the morning to get yourself going after a heavy drinking session?: Never 7. How often during the last year have you had a feeling of guilt of remorse after drinking?: Never 8. How often during the last year have you been unable to remember what happened the night before because you had been drinking?: Never 9. Have you or someone else been injured as a result of your drinking?: No 10. Has a relative or friend or a doctor or another health worker been concerned about your drinking or suggested you cut down?: No Alcohol Use Disorder Identification Test Final Score (AUDIT): 0 Brief Intervention: AUDIT score less than 7 or less-screening does not suggest unhealthy drinking-brief intervention not indicated Substance Abuse History in the last 12 months:   No. Consequences of Substance Abuse: NA Previous Psychotropic Medications: Yes  Psychological Evaluations: No  Past Medical History:  Past Medical History  Diagnosis Date  . Hypertension   . Rectal fistula   . Dysuria   . Diverticulosis   . Gastroparesis   . Borderline personality disorder   . Major depression ([place])   . Schizophrenia ([place])   . Hypokalemia   . Microscopic hematuria   . External hemorrhoid   . Over weight   . Nicotine addiction   . Difficulty urinating   . Stroke ([place])   . Benign essential HTN 08/27/2014  . BP (high blood pressure) 01/16/2015  . Anxiety     Past Surgical History  Procedure Laterality Date  . Lumbar spine surgery    . Colonoscopy    . Colonoscopy with propofol N/A 10/12/2014    Procedure: COLONOSCOPY WITH PROPOFOL;  Surgeon: Hulen Luster, MD;  Location: [facility] ENDOSCOPY;  Service: Gastroenterology;  Laterality: N/A;  . Tonsillectomy    . Back surgery     Family History:  Family History  Problem Relation Age of Onset  . Stroke Mother   . Stroke Maternal Grandmother   . Diabetes Mellitus II Sister    Family Psychiatric  History: None reported. Social History:  History  Alcohol Use No    Comment: last drink was in march 2016     History  Drug Use No    Social History   Social History  . Marital Status: Single    Spouse Name: N/A  . Number of Children: N/A  . Years  of Education: N/A   Social History Main Topics  . Smoking status: Former Smoker    Types: Cigarettes    Quit date: 08/19/2014  . Smokeless tobacco: None  . Alcohol Use: No     Comment: last drink was in march 2016  . Drug Use: No  . Sexual Activity: Not Asked   Other Topics Concern  . None   Social History Narrative   Additional Social History:                         Allergies:   Allergies  Allergen Reactions  . Diflucan [Fluconazole] Diarrhea  . Ciprofloxacin Other (See Comments)    GI distress  . Milk-Related Compounds Other (See Comments)     GI problems  . Pepto-Bismol [Bismuth Subsalicylate] Other (See Comments)    "Bad reaction"  . Trazodone And Nefazodone Other (See Comments)    Burn tongue and causes blisters  . Sulfur Other (See Comments)    GI distress    Lab Results:  Results for orders placed or performed during the hospital encounter of 03/29/15 (from the past 48 hour(s))  Comprehensive metabolic panel     Status: Abnormal   Collection Time: 03/29/15  3:26 PM  Result Value Ref Range   Sodium 133 (L) 135 - 145 mmol/L   Potassium 4.4 3.5 - 5.1 mmol/L   Chloride 98 (L) 101 - 111 mmol/L   CO2 28 22 - 32 mmol/L   Glucose, Bld 106 (H) 65 - 99 mg/dL   BUN 11 6 - 20 mg/dL   Creatinine, Ser 0.80 0.61 - 1.24 mg/dL   Calcium 10.2 8.9 - 10.3 mg/dL   Total Protein 7.2 6.5 - 8.1 g/dL   Albumin 4.7 3.5 - 5.0 g/dL   AST 20 15 - 41 U/L   ALT 21 17 - 63 U/L   Alkaline Phosphatase 64 38 - 126 U/L   Total Bilirubin 0.4 0.3 - 1.2 mg/dL   GFR calc non Af Amer >60 >60 mL/min   GFR calc Af Amer >60 >60 mL/min    Comment: (NOTE) The eGFR has been calculated using the CKD EPI equation. This calculation has not been validated in all clinical situations. eGFR's persistently <60 mL/min signify possible Chronic Kidney Disease.    Anion gap 7 5 - 15  Ethanol (ETOH)     Status: None   Collection Time: 03/29/15  3:26 PM  Result Value Ref Range   Alcohol, Ethyl (B) <5 <5 mg/dL    Comment:        LOWEST DETECTABLE LIMIT FOR SERUM ALCOHOL IS 5 mg/dL FOR MEDICAL PURPOSES ONLY   Salicylate level     Status: None   Collection Time: 03/29/15  3:26 PM  Result Value Ref Range   Salicylate Lvl <1.8 2.8 - 30.0 mg/dL  Acetaminophen level     Status: Abnormal   Collection Time: 03/29/15  3:26 PM  Result Value Ref Range   Acetaminophen (Tylenol), Serum <10 (L) 10 - 30 ug/mL    Comment:        THERAPEUTIC CONCENTRATIONS VARY SIGNIFICANTLY. A RANGE OF 10-30 ug/mL MAY BE AN EFFECTIVE CONCENTRATION FOR MANY PATIENTS. HOWEVER, SOME ARE  BEST TREATED AT CONCENTRATIONS OUTSIDE THIS RANGE. ACETAMINOPHEN CONCENTRATIONS >150 ug/mL AT 4 HOURS AFTER INGESTION AND >50 ug/mL AT 12 HOURS AFTER INGESTION ARE OFTEN ASSOCIATED WITH TOXIC REACTIONS.   CBC     Status: Abnormal   Collection Time: 03/29/15  3:26 PM  Result Value Ref Range   WBC 5.5 3.8 - 10.6 K/uL   RBC 4.65 4.40 - 5.90 MIL/uL   Hemoglobin 13.8 13.0 - 18.0 g/dL   HCT 39.7 (L) 40.0 - 52.0 %   MCV 85.3 80.0 - 100.0 fL   MCH 29.7 26.0 - 34.0 pg   MCHC 34.8 32.0 - 36.0 g/dL   RDW 13.6 11.5 - 14.5 %   Platelets 391 150 - 440 K/uL  Urine Drug Screen, Qualitative ([facility] only)     Status: Abnormal   Collection Time: 03/29/15  3:26 PM  Result Value Ref Range   Tricyclic, Ur Screen NONE DETECTED NONE DETECTED   Amphetamines, Ur Screen NONE DETECTED NONE DETECTED   MDMA (Ecstasy)Ur Screen NONE DETECTED NONE DETECTED   Cocaine Metabolite,Ur [place] NONE DETECTED NONE DETECTED   Opiate, Ur Screen NONE DETECTED NONE DETECTED   Phencyclidine (PCP) Ur S NONE DETECTED NONE DETECTED   Cannabinoid 50 Ng, Ur [place] NONE DETECTED NONE DETECTED   Barbiturates, Ur Screen NONE DETECTED NONE DETECTED   Benzodiazepine, Ur Scrn POSITIVE (A) NONE DETECTED   Methadone Scn, Ur NONE DETECTED NONE DETECTED    Comment: (NOTE) 665  Tricyclics, urine               Cutoff 1000 ng/mL 200  Amphetamines, urine             Cutoff 1000 ng/mL 300  MDMA (Ecstasy), urine           Cutoff 500 ng/mL 400  Cocaine Metabolite, urine       Cutoff 300 ng/mL 500  Opiate, urine                   Cutoff 300 ng/mL 600  Phencyclidine (PCP), urine      Cutoff 25 ng/mL 700  Cannabinoid, urine              Cutoff 50 ng/mL 800  Barbiturates, urine             Cutoff 200 ng/mL 900  Benzodiazepine, urine           Cutoff 200 ng/mL 1000 Methadone, urine                Cutoff 300 ng/mL 1100 1200 The urine drug screen provides only a preliminary, unconfirmed 1300 analytical test result and should not be used for  non-medical 1400 purposes. Clinical consideration and professional judgment should 1500 be applied to any positive drug screen result due to possible 1600 interfering substances. A more specific alternate chemical method 1700 must be used in order to obtain a confirmed analytical result.  1800 Gas chromato graphy / mass spectrometry (GC/MS) is the preferred 1900 confirmatory method.   Valproic acid level     Status: Abnormal   Collection Time: 03/29/15  4:20 PM  Result Value Ref Range   Valproic Acid Lvl <10 (L) 50.0 - 100.0 ug/mL    Comment: RESULT CONFIRMED BY MANUAL DILUTION    Metabolic Disorder Labs:  Lab Results  Component Value Date   HGBA1C 5.7* 10/20/2014   MPG 117 10/20/2014   No results found for: PROLACTIN Lab Results  Component Value Date   CHOL 164 10/20/2014   TRIG 266* 10/20/2014   HDL 34* 10/20/2014   CHOLHDL 4.8 10/20/2014   VLDL 53* 10/20/2014   LDLCALC 77 10/20/2014    Current Medications: Current Facility-Administered Medications  Medication Dose Route Frequency Provider Last Rate Last Dose  .  acetaminophen (TYLENOL) tablet 650 mg  650 mg Oral Q6H PRN Gonzella Lex, MD      . ALPRAZolam Duanne Moron) tablet 1 mg  1 mg Oral TID Gonzella Lex, MD   1 mg at 03/30/15 1228  . alum & mag hydroxide-simeth (MAALOX/MYLANTA) 200-200-20 MG/5ML suspension 30 mL  30 mL Oral Q4H PRN Gonzella Lex, MD      . Derrill Memo ON 03/31/2015] amLODipine (NORVASC) tablet 5 mg  5 mg Oral Daily Biance Moncrief B Margo Lama, MD      . diphenhydrAMINE (BENADRYL) capsule 50 mg  50 mg Oral QHS John T Clapacs, MD      . divalproex (DEPAKOTE) DR tablet 500 mg  500 mg Oral 3 times per day Keeli Roberg B Breianna Delfino, MD      . docusate sodium (COLACE) capsule 200 mg  200 mg Oral BID Laporshia Hogen B Trisha Morandi, MD      . magnesium citrate solution 1 Bottle  1 Bottle Oral Once Juleen Sorrels B Brelyn Woehl, MD      . magnesium hydroxide (MILK OF MAGNESIA) suspension 30 mL  30 mL Oral Daily PRN Gonzella Lex, MD      .  metoprolol succinate (TOPROL-XL) 24 hr tablet 100 mg  100 mg Oral Daily Nadege Carriger B Christpoher Sievers, MD      . polyethylene glycol (MIRALAX / GLYCOLAX) packet 17 g  17 g Oral Daily Delbert Darley B Oceana Walthall, MD      . sertraline (ZOLOFT) tablet 50 mg  50 mg Oral Daily Maximilliano Kersh B Uyen Eichholz, MD      . temazepam (RESTORIL) capsule 30 mg  30 mg Oral QHS PRN Lynde Ludwig B Laurence Crofford, MD       PTA Medications: Prescriptions prior to admission  Medication Sig Dispense Refill Last Dose  . ALPRAZolam (XANAX) 1 MG tablet Take 1 tablet (1 mg total) by mouth 3 (three) times daily as needed for anxiety. 30 tablet 0 Taking  . amLODipine (NORVASC) 5 MG tablet Take 1 tablet (5 mg total) by mouth daily. 30 tablet 0 Taking  . bisacodyl (DULCOLAX) 5 MG EC tablet Take 5 mg by mouth daily as needed for mild constipation or moderate constipation (constipation).    Not Taking  . cloNIDine (CATAPRES) 0.2 MG tablet Take 1 tablet (0.2 mg total) by mouth at bedtime. (Patient not taking: Reported on 02/26/2015) 30 tablet 0 Not Taking  . divalproex (DEPAKOTE ER) 500 MG 24 hr tablet Take 1 tablet (500 mg total) by mouth at bedtime. (Patient not taking: Reported on 02/26/2015) 30 tablet 0 Not Taking  . hydrOXYzine (ATARAX/VISTARIL) 50 MG tablet Take 2 tablets (100 mg total) by mouth at bedtime as needed (sleep). 60 tablet 0 Taking  . magnesium hydroxide (MILK OF MAGNESIA) 400 MG/5ML suspension Take 30 mLs by mouth daily as needed for mild constipation.   Taking  . Melatonin 10 MG TABS Take 10 mg by mouth at bedtime.   Not Taking  . metoprolol succinate (TOPROL-XL) 100 MG 24 hr tablet Take 1 tablet (100 mg total) by mouth daily. Take with or immediately following a meal. (Patient taking differently: Take 50 mg by mouth daily. Take with or immediately following a meal.) 30 tablet 0 Taking  . polyethylene glycol (MIRALAX) packet Take 17 g by mouth daily. 14 each 0   . sertraline (ZOLOFT) 100 MG tablet Take 100 mg by mouth daily.   Taking  .  sulfamethoxazole-trimethoprim (BACTRIM DS,SEPTRA DS) 800-160 MG tablet Take 1 tablet by mouth once. 60 tablet 1   .  zolpidem (AMBIEN) 10 MG tablet Take 1 tablet (10 mg total) by mouth at bedtime. 30 tablet 0 01/07/2015    Musculoskeletal: Strength & Muscle Tone: within normal limits Gait & Station: normal Patient leans: N/A  Psychiatric Specialty Exam: Physical Exam  Nursing note and vitals reviewed.   Review of Systems  Gastrointestinal: Positive for constipation.  Psychiatric/Behavioral: The patient has insomnia.   All other systems reviewed and are negative.   Blood pressure 137/96, pulse 72, temperature 98 F (36.7 C), temperature source Oral, resp. rate 20, height '5\' 7"'  (1.702 m), weight 67.132 kg (148 lb), SpO2 99 %.Body mass index is 23.17 kg/(m^2).  See SRA.                                                  Sleep:        Treatment Plan Summary: Daily contact with patient to assess and evaluate symptoms and progress in treatment and Medication management   Mr. Fedak is a 49 year old male with a history of depression and psychosis admitting floridly psychotic and depressed in the context of medication noncompliance and severe social stressors.  1. Suicidal ideation. He is able to contract for safety in the hospital.  2. Mood. The patient has been treated in the past with a combination of Depakote and Zoloft. Depakote level was low on admission we will restart Depakote for mood stabilization and Zoloft for depression.  3. Anxiety. The patient reportedly is prescribed Xanax. This was restarted in the emergency room.  4. Hypertension. We restarted metoprolol  and Norvasc.  5. Constipation. It is unclear if this is delusional. Seems like constant complaint. Will start bowel regimen.  6. Insomnia. The patient claims not to be able to sleep for months. Will offer Restoril.   7.  Disposition. To be established. He is homeless and uninsured, and without  any support.   Observation Level/Precautions:  15 minute checks  Laboratory:  CBC Chemistry Profile UDS UA  Psychotherapy:    Medications:    Consultations:    Discharge Concerns:    Estimated LOS:  Other:     I certify that inpatient services furnished can reasonably be expected to improve the patient's condition.   Victorious Cosio 11/8/20163:09 PM

## 2015-03-30 NOTE — Tx Team (Signed)
Initial Interdisciplinary Treatment Plan   PATIENT STRESSORS: Health problems Medication change or noncompliance   PATIENT STRENGTHS: Curator fund of knowledge   PROBLEM LIST: Problem List/Patient Goals Date to be addressed Date deferred Reason deferred Estimated date of resolution  Anxiety      Psychosis                                                 DISCHARGE CRITERIA:  Improved stabilization in mood, thinking, and/or behavior Motivation to continue treatment in a less acute level of care  PRELIMINARY DISCHARGE PLAN: Outpatient therapy Placement in alternative living arrangements  PATIENT/FAMIILY INVOLVEMENT: This treatment plan has been presented to and reviewed with the patient, Jeremiah [name].  The patient and family have been given the opportunity to ask questions and make suggestions.  Shandee Jergens, Severiano Gilbert 03/30/2015, 1:45 PM

## 2015-03-30 NOTE — Progress Notes (Signed)
D: Patient admitted to behavioral med unit from ED under IVC due to bizarre behavior. He states that he is here because, "I haven't been able to sleep, and my bowels aren't working. I'm dying, and I need something for pain." Speech is rambling and tangential. He will not answer when asked if he is having auditory hallucinations: "Why does it matter if I am?"  States he is homeless and lost his job in April 2016 after his bowels stopped working. 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 He is focused on physical issues that do not seem to be substantiated. For instance, he says he has a brain injury from hitting his chin on the floor in October, but it was never diagnosed. He also says he has not had a regular bowel movement since April, although he has had a colonoscopy that was normal, his bowel sounds are normal, and abdomen soft and not distended.  A: Skin assessment completed. On q 15 minute checks. R: No contraband found. Oriented to unit. Continue to monitor.

## 2015-03-30 NOTE — [facility] Group Notes (Signed)
[facility] Group Notes:  (Nursing/MHT/Case Management/Adjunct)  Date:  03/30/2015  Time:  2:05 PM  Type of Therapy:  Psychoeducational Skills  Participation Level:  Did Not Attend   Celso Amy 03/30/2015, 2:05 PM

## 2015-03-30 NOTE — Tx Team (Signed)
Interdisciplinary Treatment Plan Update (Adult)  Date:  03/30/2015 Time Reviewed:  1:48 PM  Progress in Treatment: Attending groups: No. Participating in groups:  No. Taking medication as prescribed:  Yes. Tolerating medication:  Yes. Family/Significant othe contact made:  No, will contact:  if provided with consent Patient understands diagnosis:  Yes. Discussing patient identified problems/goals with staff:  Yes. Medical problems stabilized or resolved:  Yes. Denies suicidal/homicidal ideation: Yes. Issues/concerns per patient self-inventory:  No. Other:  New problem(s) identified: No, Describe:  none reported  Discharge Plan or Barriers: Patient is currently homeless and depressed with SI. Patient also reports he is unemployed and will need mental health follow up at discharge as well as referral to homeless shelter.   Reason for Continuation of Hospitalization: Depression Suicidal ideation  Comments: new admit and started on antidepressant  Estimated length of stay: up to 4 days expected discharge Friday  New goal(s):  Review of initial/current patient goals per problem list:   1.  Goals: Participate in discharge and treatment planning  Met:  No  Target date:by discharge  2.  Goal (s): eliminate SI  Met:  No  Target date:by discharge  3.  Goal(s): decrease depression  Met:  No  Target date:by discharge   Attendees: Physician:  Orson Slick, MD 11/8/20161:48 PM  Nursing:   Mordecai Rasmussen, RN 11/8/20161:48 PM  Other:  Carmell Austria, LCSWA 11/8/20161:48 PM  Other:   11/8/20161:48 PM  Other:   11/8/20161:48 PM  Other:  11/8/20161:48 PM  Other:  11/8/20161:48 PM  Other:  11/8/20161:48 PM  Other:  11/8/20161:48 PM  Other:  11/8/20161:48 PM  Other:  11/8/20161:48 PM  Other:   11/8/20161:48 PM   Scribe for Treatment Team:   Keene Breath, MSW, LCSWA  03/30/2015, 1:48 PM

## 2015-03-30 NOTE — ED Notes (Signed)
Report received from [facility] .Will continue to monitor for safety via security cameras and Q 15 minute checks.

## 2015-03-31 LAB — HEMOGLOBIN A1C: Hgb A1c MFr Bld: 5.1 % (ref 4.0–6.0)

## 2015-03-31 NOTE — B[facility] Group Notes (Signed)
[facility] LCSW Aftercare Discharge Planning Group Note   03/31/2015 5:02 PM  Participation Quality:  Invited. Did not attend   Wray Kearns MSW, [place]

## 2015-03-31 NOTE — Progress Notes (Signed)
D: Patient has been isolative to his room much of the day. Has attended one group and ate his meals in the dayroom. Still complains about bowel issues. He said he had a bowel movement yesterday but it was liquid (had Mag Citrate previously). Continues to have a somatic focus but is not as anxious as yesterday. Has vague SI due to his belief that he is dying; contracts for safety. Denies sx of psychosis. Has disheveled appearance, flat affect. Still wearing scrubs from yesterday, despite having his own clothes. A: On q 15 minute checks. Given meds. Offered emotional support. R: Depressed.

## 2015-03-31 NOTE — Progress Notes (Signed)
Recreation Therapy Notes  Date: 11.09.16 Time: 3:00 pm Location: Craft Room  Group Topic: Self-esteem  Goal Area(s) Addresses:  Patients will write at least one positive trait about self.  Behavioral Response: Attentive  Intervention: I Am  Activity: Patients were given a worksheet with the letter I on it and instructed to list as many positive traits about themselves as they could.  Education:LRT educated patients on ways they can increase their self-esteem.  Education Outcome: Acknowledges education/In group clarification offered  Clinical Observations/Feedback: Patient wrote positive traits about himself.  Leonette Monarch, LRT/CTRS 03/31/2015 4:26 PM

## 2015-03-31 NOTE — Progress Notes (Signed)
Pt is anxious and somatic.  Tangential with FOI. Fixated on his bowels, states he haven't had bowel movement since April. Bowel sounds present all 4 quads. Abdomen soft and non distended. Med compliant. Attends group. restoril 30mg  given at 2150 for insomnia  PRN. Denies SI/HI/AV/H. No c/o pain/discomfort noted.

## 2015-03-31 NOTE — BHH Group Notes (Signed)
[place] LCSW Group Therapy  03/31/2015 5:00 PM  Type of Therapy:  Group Therapy  Participation Level:  Did Not Attend   Modes of Intervention:  Discussion, Education, Socialization and Support  Summary of Progress/Problems: Emotional Regulation: Patients will identify both negative and positive emotions. They will discuss emotions they have difficulty regulating and how they impact their lives. Patients will be asked to identify healthy coping skills to combat unhealthy reactions to negative emotions.     [place] MSW, LCSWA  03/31/2015, 5:00 PM

## 2015-03-31 NOTE — Progress Notes (Signed)
Recreation Therapy Notes  INPATIENT RECREATION THERAPY ASSESSMENT  Patient Details Name: Jeremiah [name] MRN: [id number] DOB: January 02, 1966 Today's Date: 03/31/2015  Patient Stressors: Family, Relationship, Friends, Other (Comment) (Not a good relationship with his family anymore - "I have changed and am not the same person"; broke-up with girlfriend in Feb.; lack of friends; head injury - "Being dead is stressful")  Coping Skills:   Isolate, Avoidance  Personal Challenges: Anger, Communication, Concentration, Decision-Making, Expressing Yourself, Problem-Solving, Relationships, Self-Esteem/Confidence, Social Interaction, Stress Management, Time Management, Trusting Others  Leisure Interests (2+):  Individual - Other (Comment) (Vacation to [employer], ride on glass bottom boat)  Awareness of Community Resources:  Yes  Community Resources:  Park  Current Use: No  If no, Barriers?: Transportation, Other (Comment) (Head injury)  Patient Strengths:  Way he was before the brain injury, talents he used to have  Patient Identified Areas of Improvement:  Brain operation  Current Recreation Participation:  Nothing  Patient Goal for Hospitalization:  Die peacefully  [place] of Residence:  [place] of Residence:  [place]   Current SI (including self-harm):   ("I would rather not answer that.")  Current HI:  No  Consent to Intern Participation: N/A   Leonette Monarch, LRT/CTRS 03/31/2015, 2:56 PM

## 2015-03-31 NOTE — BHH Group Notes (Signed)
[place] Group Notes:  (Nursing/MHT/Case Management/Adjunct)  Date:  03/31/2015  Time:  1:25 PM  Type of Therapy:  Psychoeducational Skills  Participation Level:  Did Not Attend   Jeremiah [name] 03/31/2015, 1:25 PM

## 2015-03-31 NOTE — Plan of Care (Signed)
Problem: Ineffective individual coping Goal: STG: Pt will be able to identify effective and ineffective STG: Pt will be able to identify effective and ineffective coping patterns  Outcome: Progressing Attending some groups despite reluctance.

## 2015-03-31 NOTE — Progress Notes (Addendum)
[facility] MD Progress Note  03/31/2015 2:19 PM AVRAJ LINDROTH  MRN:  [id number]  Subjective:  Mr. Ratz continues to be depressed, suicidal, and delusional. He is still very somatic he complains of diarrhea today. This is most likely in response to laxatives she was given yesterday to address constipation. He is still complains about his "severe head injury" leading to all his problems: mental, physical and social. She slept 7-1/2 hours with Restoril but does not feel rested in the morning. He denies any side effects of new medications. He now reports symptoms suggestive of urinary tract infection and insists that she needs to be on antibiotics for prostatitis.  Principal Problem: Major depressive disorder, recurrent episode, severe, with psychosis ([place]) Diagnosis:   Patient Active Problem List   Diagnosis Date Noted  . Major depressive disorder, recurrent episode, severe, with psychosis ([place]) [F33.3] 03/30/2015  . Homelessness [Z59.0] 03/29/2015  . Chronic constipation [K59.00] 03/29/2015  . 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 Cervical pain [M54.2] 01/18/2015  . Suicide threat or attempt [F48.9] 12/14/2014  . Cognitive and behavioral changes [R41.89, F91.9] 11/22/2014  . Microscopic hematuria [R31.29] 11/22/2014  . Disorder of ejaculation [N53.19] 11/22/2014  . 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 H/O autism spectrum disorder [Z86.59] 10/20/2014  . MDD (major depressive disorder), recurrent episode, with atypical features ([place]) [F33.9] 10/19/2014  . GAD (generalized anxiety disorder) [F41.1] 10/19/2014  . Tooth ache [K08.89] 10/19/2014  . Suicidal ideation [R45.851]   . Benign essential HTN [I10] 08/27/2014  . Affective bipolar disorder ([place]) [F31.9] 12/05/2013  . Decreased potassium in the blood [E87.6] 12/05/2013  . 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 Prostatitis [N41.9]  12/05/2013  . 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 Schizophrenia ([place]) [F20.9] 12/05/2013   Total Time spent with patient: 20 minutes  Past Psychiatric History: Depression.  Past Medical History:  Past Medical History  Diagnosis Date  . Hypertension   . Rectal fistula   . Dysuria   . Diverticulosis   . Gastroparesis   . Borderline personality disorder   . Major depression ([place])   . Schizophrenia ([place])   . Hypokalemia   . Microscopic hematuria   . External hemorrhoid   . Over weight   . Nicotine addiction   . Difficulty urinating   . Stroke ([place])   . Benign essential HTN 08/27/2014  . BP (high blood pressure) 01/16/2015  . Anxiety     Past Surgical History  Procedure Laterality Date  . Lumbar spine surgery    . Colonoscopy    . Colonoscopy with propofol N/A 10/12/2014    Procedure: COLONOSCOPY WITH PROPOFOL;  Surgeon: Hulen Luster, MD;  Location: [facility] ENDOSCOPY;  Service: Gastroenterology;  Laterality: N/A;  . Tonsillectomy    . Back surgery     Family History:  Family History  Problem Relation Age of Onset  . Stroke Mother   . Stroke Maternal Grandmother   . Diabetes Mellitus II Sister    Family Psychiatric  History: None reported. Social History:  History  Alcohol Use No    Comment: last drink was in march 2016     History  Drug Use No    Social History   Social History  . Marital Status: Single    Spouse Name: N/A  . Number of Children: N/A  . Years of Education: N/A   Social History Main Topics  . Smoking status: Former Smoker  Types: Cigarettes    Quit date: 08/19/2014  . Smokeless tobacco: None  . Alcohol Use: No     Comment: last drink was in march 2016  . Drug Use: No  . Sexual Activity: Not Asked   Other Topics Concern  . None   Social History Narrative   Additional Social History:                         Sleep: Good  Appetite:  Fair  Current Medications: Current Facility-Administered Medications  Medication  Dose Route Frequency Provider Last Rate Last Dose  . acetaminophen (TYLENOL) tablet 650 mg  650 mg Oral Q6H PRN Gonzella Lex, MD      . ALPRAZolam Duanne Moron) tablet 1 mg  1 mg Oral TID Gonzella Lex, MD   1 mg at 03/31/15 1010  . alum & mag hydroxide-simeth (MAALOX/MYLANTA) 200-200-20 MG/5ML suspension 30 mL  30 mL Oral Q4H PRN Gonzella Lex, MD      . amLODipine (NORVASC) tablet 5 mg  5 mg Oral Daily Auther Lyerly B Carleta Woodrow, MD   5 mg at 03/31/15 1125  . ARIPiprazole (ABILIFY) tablet 5 mg  5 mg Oral QHS Clovis Fredrickson, MD   5 mg at 03/30/15 2152  . cloNIDine (CATAPRES) tablet 0.2 mg  0.2 mg Oral BID Clovis Fredrickson, MD   0.2 mg at 03/30/15 2149  . diphenhydrAMINE (BENADRYL) capsule 50 mg  50 mg Oral QHS Gonzella Lex, MD   50 mg at 03/30/15 2149  . docusate sodium (COLACE) capsule 200 mg  200 mg Oral BID Rai Severns B Mylee Falin, MD   200 mg at 03/31/15 1010  . FLUoxetine (PROZAC) capsule 20 mg  20 mg Oral Daily Meesha Sek B Marvin Grabill, MD   20 mg at 03/31/15 1011  . magnesium hydroxide (MILK OF MAGNESIA) suspension 30 mL  30 mL Oral Daily PRN Gonzella Lex, MD      . metoprolol succinate (TOPROL-XL) 24 hr tablet 100 mg  100 mg Oral Daily Avnoor Koury B Hailie Searight, MD   100 mg at 03/30/15 1621  . polyethylene glycol (MIRALAX / GLYCOLAX) packet 17 g  17 g Oral Daily Alleya Demeter B Ruqayya Ventress, MD   17 g at 03/31/15 1012  . temazepam (RESTORIL) capsule 30 mg  30 mg Oral QHS PRN Clovis Fredrickson, MD   30 mg at 03/30/15 2153    Lab Results:  Results for orders placed or performed during the hospital encounter of 03/30/15 (from the past 48 hour(s))  Lipid panel, fasting     Status: Abnormal   Collection Time: 03/29/15  3:26 PM  Result Value Ref Range   Cholesterol 166 0 - 200 mg/dL   Triglycerides 84 <150 mg/dL   HDL 44 >40 mg/dL   Total CHOL/HDL Ratio 3.8 RATIO   VLDL 17 0 - 40 mg/dL   LDL Cholesterol 105 (H) 0 - 99 mg/dL    Comment:        Total Cholesterol/HDL:CHD Risk Coronary Heart Disease  Risk Table                     Men   Women  1/2 Average Risk   3.4   3.3  Average Risk       5.0   4.4  2 X Average Risk   9.6   7.1  3 X Average Risk  23.4   11.0  Use the calculated Patient Ratio above and the CHD Risk Table to determine the patient's CHD Risk.        ATP III CLASSIFICATION (LDL):  <100     mg/dL   Optimal  100-129  mg/dL   Near or Above                    Optimal  130-159  mg/dL   Borderline  160-189  mg/dL   High  >190     mg/dL   Very High   TSH     Status: None   Collection Time: 03/29/15  3:26 PM  Result Value Ref Range   TSH 1.061 0.350 - 4.500 uIU/mL  Urinalysis complete, with microscopic ([facility] only)     Status: Abnormal   Collection Time: 03/30/15  4:35 PM  Result Value Ref Range   Color, Urine STRAW (A) YELLOW   APPearance HAZY (A) CLEAR   Glucose, UA NEGATIVE NEGATIVE mg/dL   Bilirubin Urine NEGATIVE NEGATIVE   Ketones, ur NEGATIVE NEGATIVE mg/dL   Specific Gravity, Urine 1.006 1.005 - 1.030   Hgb urine dipstick 1+ (A) NEGATIVE   pH 8.0 5.0 - 8.0   Protein, ur NEGATIVE NEGATIVE mg/dL   Nitrite NEGATIVE NEGATIVE   Leukocytes, UA NEGATIVE NEGATIVE   RBC / HPF 0-5 0 - 5 RBC/hpf   WBC, UA 0-5 0 - 5 WBC/hpf   Bacteria, UA RARE (A) NONE SEEN   Squamous Epithelial / LPF NONE SEEN NONE SEEN   Budding Yeast PRESENT     Physical Findings: AIMS:  , ,  ,  ,    CIWA:    COWS:     Musculoskeletal: Strength & Muscle Tone: within normal limits Gait & Station: normal Patient leans: N/A  Psychiatric Specialty Exam: Review of Systems  Gastrointestinal: Positive for diarrhea and constipation.  Psychiatric/Behavioral: The patient has insomnia.   All other systems reviewed and are negative.   Blood pressure 90/62, pulse 69, temperature 97.6 F (36.4 C), temperature source Oral, resp. rate 20, height 5\' 7"  (1.702 m), weight 67.132 kg (148 lb), SpO2 99 %.Body mass index is 23.17 kg/(m^2).  General Appearance: Disheveled  Eye Contact::  Poor   Speech:  Normal Rate  Volume:  Decreased  Mood:  Depressed, Hopeless and Worthless  Affect:  Flat  Thought Process:  Disorganized  Orientation:  Full (Time, Place, and Person)  Thought Content:  Delusions and Paranoid Ideation  Suicidal Thoughts:  Yes.  with intent/plan  Homicidal Thoughts:  No  Memory:  Immediate;   Fair Recent;   Fair Remote;   Fair  Judgement:  Impaired  Insight:  Lacking  Psychomotor Activity:  Decreased  Concentration:  Fair  Recall:  [employer] of Knowledge:Fair  Language: Fair  Akathisia:  No  Handed:  Right  AIMS (if indicated):     Assets:  Communication Skills Desire for Improvement Resilience  ADL's:  Intact  Cognition: WNL  Sleep:  Number of Hours: 7.3   Treatment Plan Summary: Daily contact with patient to assess and evaluate symptoms and progress in treatment and Medication management   Mr. Tanimoto is a 49 year old male with a history of depression and psychosis admitting floridly psychotic and depressed in the context of medication noncompliance and severe social stressors.  1. Suicidal ideation. He is able to contract for safety in the hospital.  2. Mood. The patient has been treated in the past with a combination of Depakote and Zoloft. Depakote  level was low on admission. He refuses to take Depakote. And believes that Prozac is better than Zoloft. Will started Prozac and Abilify for depression, psychosis, and mood stabilization. Will increase Abilify.  3. Anxiety. The patient reportedly is prescribed Xanax. This was restarted in the emergency room.  4. Hypertension. We restarted metoprolol, clonidine and Norvasc. His antihypertensives were held this morning due to low blood pressure. The patient reports that he has been taking blood pressure medication at home prior to admission but it is uncertain.  5. Constipation. It is unclear if this is delusional. Seems like constant complaint. Will start bowel regimen.  6. Insomnia. The patient  claims not to be able to sleep for months. He slept well with Restoril.   7. Urinary tract infection. The patient has a history of prostatitis he believes that he needs to be on antibiotic. Urinalysis is not suggestive of urinary tract action but we will take urine culture.   8. Metabolic syndrome. Lipid panel is not elevated. TSH normal.   9. Disposition. To be established. He is homeless and uninsured, and without any support.   Jacier Gladu 03/31/2015, 2:19 PM

## 2015-03-31 NOTE — Progress Notes (Signed)
Lab called and requested that urine culture be added on to UA from yesterday. Waiting for call back.

## 2015-03-31 NOTE — Tx Team (Signed)
Initial Interdisciplinary Treatment Plan   PATIENT STRESSORS: Financial difficulties   PATIENT STRENGTHS: Average or above average intelligence   PROBLEM LIST: Problem List/Patient Goals Date to be addressed Date deferred Reason deferred Estimated date of resolution  psychosis 03/31/2015     depression 03/31/2015                                                DISCHARGE CRITERIA:  Improved stabilization in mood, thinking, and/or behavior  PRELIMINARY DISCHARGE PLAN: Return to previous living arrangement  PATIENT/FAMIILY INVOLVEMENT: This treatment plan has been presented to and reviewed with the patient, Jeremiah [name], and/or family member.  The patient and family have been given the opportunity to ask questions and make suggestions.  Aleen Campi 03/31/2015, 7:01 AM

## 2015-04-01 MED ORDER — ARIPIPRAZOLE 10 MG PO TABS
10.0000 mg | ORAL_TABLET | Freq: Every day | ORAL | Status: DC
  Administered 2015-04-01 – 2015-04-05 (×5): 10 mg via ORAL
  Filled 2015-04-01 (×5): qty 1

## 2015-04-01 NOTE — Plan of Care (Signed)
Problem: [facility] Participation in Recreation Therapeutic Interventions Goal: STG-Patient will demonstrate improved self esteem by identif STG: Self-Esteem - Within 4 treatment sessions, patient will verbalize at least 5 positive affirmation statements in each of 2 treatment sessions to increase self-esteem post d/c.  Outcome: Progressing Treatment Session 1; Completed 1 out of 2: At approximately 11:15 am, LRT met with patient in patient room. Patient verbalized 5 positive affirmation statements. Patient reported it felt "awkward". LRT encouraged patient to continue saying positive affirmation statements. Intervention Used: I Am statements  Leonette Monarch, LRT/CTRS 11.10.16 2:28 pm Goal: STG-Other Recreation Therapy Goal (Specify) STG: Stress Management - Within 4 treatment sessions, patient will verbalize understanding of the stress management techniques in each of 2 treatment sessions to increase stress management skills post d/c.  Outcome: Progressing Treatment Session 1; Completed 1 out of 2: At approximately 11:15 am, LRT met with patient in patient room. LRT educated and provided patient with handouts on stress management techniques. Patient verbalized understanding. LRT encouraged patient to read over and practice the stress management techniques. Intervention Used: Mindfulness and Progressive Muscle Relaxation handouts  Leonette Monarch, LRT/CTRS 11.10.16 2:30 pm

## 2015-04-01 NOTE — BHH Group Notes (Signed)
[place] Group Notes:  (Nursing/MHT/Case Management/Adjunct)  Date:  04/01/2015  Time:  1:48 PM  Type of Therapy:  Psychoeducational Skills  Participation Level:  Active  Participation Quality:  Appropriate and Sharing  Affect:  Appropriate  Cognitive:  Lacking  Insight:  Lacking  Engagement in Group:  Engaged  Modes of Intervention:  Activity  Summary of Progress/Problems:  Jeremiah [name] 04/01/2015, 1:48 PM

## 2015-04-01 NOTE — Progress Notes (Signed)
D: Patient remains in his clothes normal gait  Non parti cipatory with unit programing . Noted guarde with his personal information . Limited insight into problems  A: Encourage patient to come to staff for any concerns or issues needing to be addressed . Instructions given on medication and address new medication Abilify , verbalize understanding. Writer addressing issues R: Voice no other concerns , staff continue tor monitor   Discharge Planning Length Of Stay 7 days , patient remains homeless

## 2015-04-01 NOTE — Progress Notes (Signed)
Observed in room, spontaneously responded to prompt and knock, A&Ox3, VSS, thoughts ([place]) and contents (lacks insight), preoccupied with TBI, "I have 8/10 prostaitis and UTI, my head hurts from TBI .Marland Kitchen.." Will continue to encourage to express feelings and need.
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
## 2015-04-01 NOTE — Plan of Care (Signed)
Problem: Ineffective individual coping Goal: STG: Patient will remain free from self harm Outcome: Progressing Medications administered as ordered by the physician, medications Therapeutic Effects, SEs and Adverse effects discussed, questions encouraged; Restoril 30 mg capsule PRN given, 15 minute checks maintained for safety, clinical and moral support provided, patient encouraged to continue to express feelings and demonstrate safe care. Patient remain free from harm, will continue to monitor.

## 2015-04-01 NOTE — Progress Notes (Signed)
[facility] MD Progress Note  04/01/2015 3:19 PM Jeremiah [name]  MRN:  [id number]  Subjective:  Jeremiah [name] complaints of severe depression, suicidal ideation, prostration and inability to walk. He is in bed and gets up for medications and meals. He has not been able to participate in groups. He is still preoccupied with his delusions of constipation and insomnia and tragic consequences of head injury in the fall of 2015. There is no evidence that the patient sustained head injury.  Principal Problem: Major depressive disorder, recurrent episode, severe, with psychosis ([place]) Diagnosis:   Patient Active Problem List   Diagnosis Date Noted  . Major depressive disorder, recurrent episode, severe, with psychosis ([place]) [F33.3] 03/30/2015  . Homelessness [Z59.0] 03/29/2015  . Chronic constipation [K59.00] 03/29/2015  . 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 Cervical pain [M54.2] 01/18/2015  . Suicide threat or attempt [F48.9] 12/14/2014  . Cognitive and behavioral changes [R41.89, F91.9] 11/22/2014  . Microscopic hematuria [R31.29] 11/22/2014  . Disorder of ejaculation [N53.19] 11/22/2014  . 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 H/O autism spectrum disorder [Z86.59] 10/20/2014  . MDD (major depressive disorder), recurrent episode, with atypical features ([place]) [F33.9] 10/19/2014  . GAD (generalized anxiety disorder) [F41.1] 10/19/2014  . Tooth ache [K08.89] 10/19/2014  . Suicidal ideation [R45.851]   . Benign essential HTN [I10] 08/27/2014  . 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 Decreased potassium in the blood [E87.6] 12/05/2013  . 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 Prostatitis [N41.9] 12/05/2013  . 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 Schizophrenia ([place]) [F20.9] 12/05/2013   Total Time spent with patient: 20 minutes  Past Psychiatric History: delusional  disorder.  Past Medical History:  Past Medical History  Diagnosis Date  . Hypertension   . Rectal fistula   . Dysuria   . Diverticulosis   . Gastroparesis   . Borderline personality disorder   . Major depression ([place])   . Schizophrenia ([place])   . Hypokalemia   . Microscopic hematuria   . External hemorrhoid   . Over weight   . Nicotine addiction   . Difficulty urinating   . Stroke ([place])   . Benign essential HTN 08/27/2014  . BP (high blood pressure) 01/16/2015  . Anxiety     Past Surgical History  Procedure Laterality Date  . Lumbar spine surgery    . Colonoscopy    . Colonoscopy with propofol N/A 10/12/2014    Procedure: COLONOSCOPY WITH PROPOFOL;  Surgeon: Hulen Luster, MD;  Location: [facility] ENDOSCOPY;  Service: Gastroenterology;  Laterality: N/A;  . Tonsillectomy    . Back surgery     Family History:  Family History  Problem Relation Age of Onset  . Stroke Mother   . Stroke Maternal Grandmother   . Diabetes Mellitus II Sister    Family Psychiatric  History: none reported. Social History:  History  Alcohol Use No    Comment: last drink was in march 2016     History  Drug Use No    Social History   Social History  . Marital Status: Single    Spouse Name: N/A  . Number of Children: N/A  . Years of Education: N/A   Social History Main Topics  . Smoking status: Former Smoker    Types: Cigarettes    Quit date: 08/19/2014  . Smokeless tobacco: None  . Alcohol Use: No     Comment: last drink was in  march 2016  . Drug Use: No  . Sexual Activity: Not Asked   Other Topics Concern  . None   Social History Narrative   Additional Social History:                         Sleep: Good  Appetite:  Fair  Current Medications: Current Facility-Administered Medications  Medication Dose Route Frequency Provider Last Rate Last Dose  . acetaminophen (TYLENOL) tablet 650 mg  650 mg Oral Q6H PRN Gonzella Lex, MD      . ALPRAZolam Duanne Moron) tablet 1 mg  1 mg  Oral TID Gonzella Lex, MD   1 mg at 04/01/15 0950  . alum & mag hydroxide-simeth (MAALOX/MYLANTA) 200-200-20 MG/5ML suspension 30 mL  30 mL Oral Q4H PRN Gonzella Lex, MD      . amLODipine (NORVASC) tablet 5 mg  5 mg Oral Daily Jasper Ruminski B Trinisha Paget, MD   5 mg at 03/31/15 1125  . ARIPiprazole (ABILIFY) tablet 5 mg  5 mg Oral QHS Clovis Fredrickson, MD   5 mg at 03/31/15 2232  . cloNIDine (CATAPRES) tablet 0.2 mg  0.2 mg Oral BID Clovis Fredrickson, MD   0.2 mg at 03/31/15 2232  . diphenhydrAMINE (BENADRYL) capsule 50 mg  50 mg Oral QHS Gonzella Lex, MD   50 mg at 03/31/15 2232  . docusate sodium (COLACE) capsule 200 mg  200 mg Oral BID Clovis Fredrickson, MD   200 mg at 04/01/15 0951  . FLUoxetine (PROZAC) capsule 20 mg  20 mg Oral Daily Tiffiny Worthy B Estie Sproule, MD   20 mg at 04/01/15 0951  . magnesium hydroxide (MILK OF MAGNESIA) suspension 30 mL  30 mL Oral Daily PRN Gonzella Lex, MD      . metoprolol succinate (TOPROL-XL) 24 hr tablet 100 mg  100 mg Oral Daily Emony Dormer B Bocephus Cali, MD   100 mg at 03/30/15 1621  . polyethylene glycol (MIRALAX / GLYCOLAX) packet 17 g  17 g Oral Daily Clovis Fredrickson, MD   17 g at 04/01/15 0951  . temazepam (RESTORIL) capsule 30 mg  30 mg Oral QHS PRN Clovis Fredrickson, MD   30 mg at 03/31/15 2240    Lab Results:  Results for orders placed or performed during the hospital encounter of 03/30/15 (from the past 48 hour(s))  Urinalysis complete, with microscopic ([facility] only)     Status: Abnormal   Collection Time: 03/30/15  4:35 PM  Result Value Ref Range   Color, Urine STRAW (A) YELLOW   APPearance HAZY (A) CLEAR   Glucose, UA NEGATIVE NEGATIVE mg/dL   Bilirubin Urine NEGATIVE NEGATIVE   Ketones, ur NEGATIVE NEGATIVE mg/dL   Specific Gravity, Urine 1.006 1.005 - 1.030   Hgb urine dipstick 1+ (A) NEGATIVE   pH 8.0 5.0 - 8.0   Protein, ur NEGATIVE NEGATIVE mg/dL   Nitrite NEGATIVE NEGATIVE   Leukocytes, UA NEGATIVE NEGATIVE   RBC / HPF 0-5 0  - 5 RBC/hpf   WBC, UA 0-5 0 - 5 WBC/hpf   Bacteria, UA RARE (A) NONE SEEN   Squamous Epithelial / LPF NONE SEEN NONE SEEN   Budding Yeast PRESENT     Physical Findings: AIMS:  , ,  ,  ,    CIWA:    COWS:     Musculoskeletal: Strength & Muscle Tone: within normal limits Gait & Station: normal Patient leans: N/A  Psychiatric Specialty Exam: Review  of Systems  Constitutional: Positive for weight loss and malaise/fatigue.  Neurological: Positive for weakness.  Psychiatric/Behavioral: Positive for depression and suicidal ideas.  All other systems reviewed and are negative.   Blood pressure 99/69, pulse 73, temperature 98.2 F (36.8 C), temperature source Oral, resp. rate 18, height 5\' 7"  (1.702 m), weight 67.132 kg (148 lb), SpO2 100 %.Body mass index is 23.17 kg/(m^2).  General Appearance: Disheveled  Eye Contact::  Minimal  Speech:  Slow  Volume:  Decreased  Mood:  Depressed, Hopeless and Worthless  Affect:  Blunt  Thought Process:  Goal Directed  Orientation:  Full (Time, Place, and Person)  Thought Content:  Delusions and Paranoid Ideation  Suicidal Thoughts:  Yes.  with intent/plan  Homicidal Thoughts:  No  Memory:  Immediate;   Fair Recent;   Fair Remote;   Fair  Judgement:  Impaired  Insight:  Lacking  Psychomotor Activity:  Decreased  Concentration:  Poor  Recall:  Poor  Fund of Knowledge:Poor  Language: Fair  Akathisia:  No  Handed:  Right  AIMS (if indicated):     Assets:  Communication Skills Desire for Improvement Physical Health Resilience  ADL's:  Intact  Cognition: WNL  Sleep:  Number of Hours: 6.3   Treatment Plan Summary: Daily contact with patient to assess and evaluate symptoms and progress in treatment and Medication management    Jeremiah [name] is a 49 year old male with a history of depression and psychosis admitting floridly psychotic and depressed in the context of medication noncompliance and severe social stressors.  1. Suicidal  ideation. He is able to contract for safety in the hospital.  2. Mood. The patient has been treated in the past with a combination of Depakote and Zoloft. Depakote level was low on admission. He refuses to take Depakote. And believes that Prozac is better than Zoloft. Will started Prozac and Abilify for depression, psychosis, and mood stabilization. Will further increase Abilify to 10 mg for paranoid delusions..  3. Anxiety. The patient reportedly is prescribed Xanax. This was restarted in the emergency room.  4. Hypertension. We restarted metoprolol, clonidine and Norvasc. His antihypertensives were held this morning due to low blood pressure. The patient reports that he has been taking blood pressure medication at home prior to admission but it is uncertain.  5. Constipation. It is unclear if this is delusional. Seems like constant complaint. Will start bowel regimen.  6. Insomnia. The patient claims not to be able to sleep for months. He slept well with Restoril.   7. Urinary tract infection. The patient has a history of prostatitis he believes that he needs to be on antibiotic. Urinalysis is not suggestive of urinary tract action but we will take urine culture.   8. Metabolic syndrome. Lipid panel is not elevated. TSH normal.   9. Disposition. To be established. He is homeless and uninsured, and without any support.    Yalexa Blust 04/01/2015, 3:19 PM

## 2015-04-01 NOTE — Progress Notes (Signed)
Recreation Therapy Notes  Date: 11.10.16 Time: 3:00 pm Location: Craft Room  Group Topic: Self-expression, Coping Skills  Goal Area(s) Addresses:  Patient will effectively use art as a means of self-expression. Patient will recognize positive benefit of self-expression. Patient will be able to identify one emotion experienced during group discussion. Patient will identify use of art as a coping skill.  Behavioral Response: Did not attend   Intervention: Two Faces of Me  Activity: Patients were given a blank face worksheet and instructed to draw a line down the middle of the worksheet. On one side, patients were instructed to draw how they felt when they were admitted to the hospital and on the other side they were instructed to draw how they want to feel when they are d/c from the hospital.  Education: LRT educated patients on how art is a good Technical sales engineer.  Education Outcome: Patient did not attend group.  Clinical Observations/Feedback: Patient did not attend group.  Leonette Monarch, LRT/CTRS 04/01/2015 4:01 PM

## 2015-04-01 NOTE — Plan of Care (Signed)
Problem: Ineffective individual coping Goal: STG: Patient will remain free from self harm Outcome: Progressing Medications administered as ordered by the physician, medications Therapeutic Effects, SEs and Adverse effects discussed, questions encouraged; Restoril 30 mg Capsule PRN given for insomnia, 15 minute checks maintained for safety, Room closer to the nurses' station, clinical and moral support provided, patient encouraged to continue to express feelings and demonstrate safe care. Patient remain free from harm, will continue to monitor.

## 2015-04-01 NOTE — Plan of Care (Signed)
Problem: Alteration in thought process Goal: STG-Patient is able to discuss thoughts with staff Outcome: Progressing Attending  Unit programing , verbalizing feelings.

## 2015-04-02 MED ORDER — FLUOXETINE HCL 20 MG PO CAPS
40.0000 mg | ORAL_CAPSULE | Freq: Every day | ORAL | Status: DC
  Administered 2015-04-03 – 2015-04-05 (×3): 40 mg via ORAL
  Filled 2015-04-02 (×3): qty 2

## 2015-04-02 MED ORDER — CHLORDIAZEPOXIDE HCL 25 MG PO CAPS
25.0000 mg | ORAL_CAPSULE | Freq: Four times a day (QID) | ORAL | Status: AC
  Administered 2015-04-03 – 2015-04-05 (×11): 25 mg via ORAL
  Filled 2015-04-02 (×11): qty 1

## 2015-04-02 NOTE — Plan of Care (Signed)
Problem: Alteration in mood Goal: LTG-Patient reports reduction in suicidal thoughts (Patient reports reduction in suicidal thoughts and is able to verbalize a safety plan for whenever patient is feeling suicidal)  Outcome: Progressing Denies suicidal ideation.     

## 2015-04-02 NOTE — B[facility] Group Notes (Signed)
[place] LCSW Group Therapy  04/02/2015 3:19 PM  Type of Therapy:  Group Therapy  Participation Level:  Did Not Attend  Modes of Intervention:  Discussion, Education, Socialization and Support  Summary of Progress/Problems: Feelings around Relapse. Group members discussed the meaning of relapse and shared personal stories of relapse, how it affected them and others, and how they perceived themselves during this time. Group members were encouraged to identify triggers, warning signs and coping skills used when facing the possibility of relapse. Social supports were discussed and explored in detail.   [place] MSW, LCSWA  04/02/2015, 3:19 PM

## 2015-04-02 NOTE — BHH Group Notes (Signed)
[place] Group Notes:  (Nursing/MHT/Case Management/Adjunct)  Date:  04/02/2015  Time:  1:07 PM  Type of Therapy:  Group Therapy  Participation Level:  Did Not Attend  Maurion Walkowiak De'Chelle Mattia Liford 04/02/2015, 1:07 PM

## 2015-04-02 NOTE — Progress Notes (Addendum)
[facility] MD Progress Note  04/02/2015 9:46 PM Jeremiah [name]  MRN:  [id number]  Subjective:  Jeremiah [name] has a history of depression admitted for worsening depressionin the context of medication noncompliance and severe social stressors. The patient believes that his poblems started with head injury that he sustained in October 2015. This is undocumented. It could be delusional as he also believes that head injury brought about extreme constipation, insomnia and UTI. He also reports severe cognitive decline and inabiliy to attend to ADLs.  Jeremiah [name] remains depressed, despondent and suicidal. He stays in bed all day long and gets up for medications and meals only. Every sentence starts with head injury, insomnia and constipation. He sleeps well now. He is not constipated.  Principal Problem: Major depressive disorder, recurrent episode, severe, with psychosis ([place]) Diagnosis:   Patient Active Problem List   Diagnosis Date Noted  . Major depressive disorder, recurrent episode, severe, with psychosis ([place]) [F33.3] 03/30/2015  . Homelessness [Z59.0] 03/29/2015  . Chronic constipation [K59.00] 03/29/2015  . 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 Cervical pain [M54.2] 01/18/2015  . Suicide threat or attempt [F48.9] 12/14/2014  . Cognitive and behavioral changes [R41.89, F91.9] 11/22/2014  . Microscopic hematuria [R31.29] 11/22/2014  . Disorder of ejaculation [N53.19] 11/22/2014  . 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 H/O autism spectrum disorder [Z86.59] 10/20/2014  . MDD (major depressive disorder), recurrent episode, with atypical features ([place]) [F33.9] 10/19/2014  . GAD (generalized anxiety disorder) [F41.1] 10/19/2014  . Tooth ache [K08.89] 10/19/2014  . Suicidal ideation [R45.851]   . Benign essential HTN [I10] 08/27/2014  . Affective bipolar disorder ([place]) [F31.9] 12/05/2013  . Decreased  potassium in the blood [E87.6] 12/05/2013  . 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 Prostatitis [N41.9] 12/05/2013  . 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 Schizophrenia ([place]) [F20.9] 12/05/2013   Total Time spent with patient: 20 minutes  Past Psychiatric History: depression.  Past Medical History:  Past Medical History  Diagnosis Date  . Hypertension   . Rectal fistula   . Dysuria   . Diverticulosis   . Gastroparesis   . Borderline personality disorder   . Major depression ([place])   . Schizophrenia ([place])   . Hypokalemia   . Microscopic hematuria   . External hemorrhoid   . Over weight   . Nicotine addiction   . Difficulty urinating   . Stroke ([place])   . Benign essential HTN 08/27/2014  . BP (high blood pressure) 01/16/2015  . Anxiety     Past Surgical History  Procedure Laterality Date  . Lumbar spine surgery    . Colonoscopy    . Colonoscopy with propofol N/A 10/12/2014    Procedure: COLONOSCOPY WITH PROPOFOL;  Surgeon: Hulen Luster, MD;  Location: [facility] ENDOSCOPY;  Service: Gastroenterology;  Laterality: N/A;  . Tonsillectomy    . Back surgery     Family History:  Family History  Problem Relation Age of Onset  . Stroke Mother   . Stroke Maternal Grandmother   . Diabetes Mellitus II Sister    Family Psychiatric  History: none reported. Social History:  History  Alcohol Use No    Comment: last drink was in march 2016     History  Drug Use No    Social History   Social History  . Marital Status: Single    Spouse Name: N/A  . Number of Children: N/A  . Years of  Education: N/A   Social History Main Topics  . Smoking status: Former Smoker    Types: Cigarettes    Quit date: 08/19/2014  . Smokeless tobacco: None  . Alcohol Use: No     Comment: last drink was in march 2016  . Drug Use: No  . Sexual Activity: Not Asked   Other Topics Concern  . None   Social History Narrative   Additional Social History:                          Sleep: Fair  Appetite:  Fair  Current Medications: Current Facility-Administered Medications  Medication Dose Route Frequency Provider Last Rate Last Dose  . acetaminophen (TYLENOL) tablet 650 mg  650 mg Oral Q6H PRN Gonzella Lex, MD      . ALPRAZolam Duanne Moron) tablet 1 mg  1 mg Oral TID Gonzella Lex, MD   1 mg at 04/02/15 1621  . alum & mag hydroxide-simeth (MAALOX/MYLANTA) 200-200-20 MG/5ML suspension 30 mL  30 mL Oral Q4H PRN Gonzella Lex, MD      . amLODipine (NORVASC) tablet 5 mg  5 mg Oral Daily Clovis Fredrickson, MD   5 mg at 04/02/15 0937  . ARIPiprazole (ABILIFY) tablet 10 mg  10 mg Oral QHS Clovis Fredrickson, MD   10 mg at 04/01/15 2144  . cloNIDine (CATAPRES) tablet 0.2 mg  0.2 mg Oral BID Clovis Fredrickson, MD   0.2 mg at 04/02/15 0936  . diphenhydrAMINE (BENADRYL) capsule 50 mg  50 mg Oral QHS Gonzella Lex, MD   50 mg at 04/01/15 2143  . docusate sodium (COLACE) capsule 200 mg  200 mg Oral BID Clovis Fredrickson, MD   200 mg at 04/02/15 0936  . FLUoxetine (PROZAC) capsule 20 mg  20 mg Oral Daily Clovis Fredrickson, MD   20 mg at 04/02/15 0936  . magnesium hydroxide (MILK OF MAGNESIA) suspension 30 mL  30 mL Oral Daily PRN Gonzella Lex, MD      . metoprolol succinate (TOPROL-XL) 24 hr tablet 100 mg  100 mg Oral Daily Zamani Crocker B Deziya Amero, MD   100 mg at 04/02/15 0936  . polyethylene glycol (MIRALAX / GLYCOLAX) packet 17 g  17 g Oral Daily Clovis Fredrickson, MD   17 g at 04/02/15 0936  . temazepam (RESTORIL) capsule 30 mg  30 mg Oral QHS PRN Clovis Fredrickson, MD   30 mg at 04/01/15 2144    Lab Results: No results found for this or any previous visit (from the past 48 hour(s)).  Physical Findings: AIMS:  , ,  ,  ,    CIWA:    COWS:     Musculoskeletal: Strength & Muscle Tone: within normal limits Gait & Station: normal Patient leans: N/A  Psychiatric Specialty Exam: Review of Systems  Constitutional: Positive for weight loss and  malaise/fatigue.  Neurological: Positive for weakness.  Psychiatric/Behavioral: Positive for depression.  All other systems reviewed and are negative.   Blood pressure 102/67, pulse 75, temperature 98 F (36.7 C), temperature source Oral, resp. rate 20, height 5\' 7"  (1.702 m), weight 67.132 kg (148 lb), SpO2 100 %.Body mass index is 23.17 kg/(m^2).  General Appearance: Disheveled  Eye Contact::  Minimal  Speech:  Slow  Volume:  Normal  Mood:  Anxious, Depressed, Hopeless and Worthless  Affect:  Blunt  Thought Process:  Irrelevant  Orientation:  Full (Time, Place, and Person)  Thought Content:  Delusions, Obsessions and Paranoid Ideation  Suicidal Thoughts:  Yes.  with intent/plan  Homicidal Thoughts:  No  Memory:  Immediate;   Poor Recent;   Poor Remote;   Poor  Judgement:  Poor  Insight:  Lacking  Psychomotor Activity:  Decreased  Concentration:  Poor  Recall:  Poor  Fund of Knowledge:Poor  Language: Fair  Akathisia:  No  Handed:  Right  AIMS (if indicated):     Assets:  Communication Skills Desire for Improvement Physical Health Resilience  ADL's:  Intact  Cognition: WNL  Sleep:  Number of Hours: 7.15   Treatment Plan Summary: Daily contact with patient to assess and evaluate symptoms and progress in treatment and Medication management   Jeremiah [name] is a 50 year old male with a history of depression and psychosis admitting floridly psychotic and depressed in the context of medication noncompliance and severe social stressors.  1. Suicidal ideation. He is able to contract for safety in the hospital.  2. Mood. The patient has been treated in the past with a combination of Depakote and Zoloft. Depakote level was low on admission. He refuses to take Depakote. And believes that Prozac is better than Zoloft. Will started Prozac and Abilify for depression, psychosis, and mood stabilization. We increased Abilify to 10 mg for paranoid delusions. I will increase Prozac to 40 mg  to address ruminations.  3. Anxiety. The patient reportedly is prescribed Xanax. This was restarted in the emergency room. Given cognitive decline, I will discontinue Xanax and start Librium taper.  4. Hypertension. We restarted metoprolol, clonidine and Norvasc. His antihypertensives were held this morning due to low blood pressure. The patient reports that he has been taking blood pressure medication at home prior to admission but it is uncertain.  5. Constipation. It is unclear if this is delusional. Seems like constant complaint. Will start bowel regimen.  6. Insomnia. The patient claims not to be able to sleep for months. He slept well with Restoril.   7. Urinary tract infection. The patient has a history of prostatitis he believes that he needs to be on antibiotic. Urinalysis is not suggestive of urinary tract action but we will take urine culture.   8. Metabolic syndrome. Lipid panel is not elevated. TSH normal.   9. Disposition. To be established. He is homeless and uninsured, and without any support.    Laine Fonner 04/02/2015, 9:46 PM

## 2015-04-02 NOTE — Progress Notes (Signed)
Verbalized depression & hopelessness in life.Denies suicidal ideation & hallucination..Stayed in room most of the time.Minimal interaction with peers.Compliant with medications.

## 2015-04-02 NOTE — Plan of Care (Signed)
Problem: [place] Sexually Violent Predator Treatment Program Participation in Recreation Therapeutic Interventions Goal: STG-Patient will demonstrate improved self esteem by identif STG: Self-Esteem - Within 4 treatment sessions, patient will verbalize at least 5 positive affirmation statements in each of 2 treatment sessions to increase self-esteem post d/c.  Outcome: Completed/Met Date Met:  04/02/15 Treatment Session 2; Completed 2 out of 2: At approximately 11:25 am, LRT met with patient in patient room. Patient verbalized 5 positive affirmation statements. Patient reported it felt "like I'm lying". LRT encouraged patient to continue saying positive affirmation statements. Intervention Used: I Am statements  Leonette Monarch, LRT/CTRS 11.11.16 2:19 pm Goal: STG-Other Recreation Therapy Goal (Specify) STG: Stress Management - Within 4 treatment sessions, patient will verbalize understanding of the stress management techniques in each of 2 treatment sessions to increase stress management skills post d/c.  Outcome: Completed/Met Date Met:  04/02/15 Treatment Session 2; Completed 2 out of 2: At approximately 11:25 am, LRT met with patient in patient room. LRT educated patient on stress management techniques. Patient verbalized understanding. LRT encouraged patient to read over and practice the stress management techniques. Intervention Used: Mindfulness and Progressive Muscle Relaxation handouts  Leonette Monarch, LRT/CTRS 11.11.16 2:21 pm

## 2015-04-02 NOTE — Plan of Care (Signed)
Problem: Alteration in thought process Goal: LTG-Patient behavior demonstrates decreased signs psychosis (Patient behavior demonstrates decreased signs of psychosis to the point the patient is safe to return home and continue treatment in an outpatient setting.)  Outcome: Progressing  Patient denies AVH, no bizarre behavior noted.

## 2015-04-02 NOTE — Evaluation (Signed)
Occupational Therapy Evaluation Patient Details Name: Jeremiah [name] MRN: [id number] DOB: 1965-11-19 Today's Date: 04/02/2015    History of Present Illness Jeremiah [name] is a 49 year old male who came to [facility]  with a mental health problem. The patient has multiple complaints. He says he has lost 100 like he points (IQ).  He reports he can't get into REM sleep.   He reports he cannot concentrate.     Clinical Impression   Patient does show some generalized weakness and reduced endurance. He feels like he can't think like he used to. He fatigues quickly. Reviewed energy saving techniques during activities of daily living with him.  He was pleasant and polite during this evaluation. Will be available if needed.    Follow Up Recommendations       Equipment Recommendations       Recommendations for Other Services       Precautions / Restrictions Restrictions Weight Bearing Restrictions: No      Mobility Bed Mobility Overal bed mobility: Independent                Transfers                      Balance                                            ADL                                         General ADL Comments: In early 2015 patient was independent. Patient decreased endurance and major depressive disorder is interfering with his independence.     Vision     Perception     Praxis      Pertinent Vitals/Pain Pain Assessment:  (Gets headaches, did not rate.)     Hand Dominance     Extremity/Trunk Assessment Upper Extremity Assessment Upper Extremity Assessment:  (B UE strength is 4-/5 with low endurance)           Communication     Cognition Arousal/Alertness: Lethargic Behavior During Therapy: Anxious                       General Comments       Exercises       Shoulder Instructions      Home Living Family/patient expects to be discharged to::  Shelter/Homeless Living Arrangements:  (Homeless)                                      Prior Functioning/Environment               OT Diagnosis: Generalized weakness   OT Problem List: Decreased strength;Decreased activity tolerance   OT Treatment/Interventions:      OT Goals(Current goals can be found in the care plan section) Acute Rehab OT Goals Patient Stated Goal: He wants to be able to do what he used to do "before the head injury." OT Goal Formulation: With patient Time For Goal Achievement: 04/16/15  OT Frequency:     Barriers to D/C:  Co-evaluation              End of Session    Activity Tolerance: Patient limited by fatigue Patient left:     Time: [id number] OT Time Calculation (min): 23 min Charges:  OT General Charges $OT Visit: 1 Procedure OT Evaluation $Initial OT Evaluation Tier I: 1 Procedure OT Treatments $Self Care/Home Management : 8-22 mins G-Codes:    Myrene Galas, MS/OTR/L  04/02/2015, 2:09 PM

## 2015-04-02 NOTE — Tx Team (Signed)
Interdisciplinary Treatment Plan Update (Adult)  Date:  04/02/2015 Time Reviewed:  2:46 PM  Progress in Treatment: Attending groups: Yes. Participating in groups:  No. Taking medication as prescribed:  Yes. Tolerating medication:  Yes. Family/Significant othe contact made:  No, will contact:  if patient provides consent Patient understands diagnosis:  Yes. Discussing patient identified problems/goals with staff:  Yes. Medical problems stabilized or resolved:  Yes. Denies suicidal/homicidal ideation: Yes. Issues/concerns per patient self-inventory:  No. Other:  New problem(s) identified: No, Describe:  none reported  Discharge Plan or Barriers: Patient is currently homeless, depressed, SI, uninsured and states problems started after head injury 2 years ago. Patient will stabilize and is offered housing shelter info and will need mental health followup at discharge  Reason for Continuation of Hospitalization: Delusions  Depression Suicidal ideation  Comments:  Estimated length of stay:up to 7 days expected discharge Thursday 04/08/15   New goal(s):  Review of initial/current patient goals per problem list:   1.  Goal(s):decrease depression   Met:  No  Target date:by discharge  2.  Goal (s):eliminate SI  Met:  No  Target date:by discharge  3.  Goal(s):delusions will be manageable  Met:  No  Target date:by discharge  Attendees: Physician:  Orson Slick, MD 11/11/20162:46 PM  Nursing:   Polly Cobia, RN 11/11/20162:46 PM  Other:  Carmell Austria, LCSWA 11/11/20162:46 PM  Other:   11/11/20162:46 PM  Other:   11/11/20162:46 PM  Other:  11/11/20162:46 PM  Other:  11/11/20162:46 PM  Other:  11/11/20162:46 PM  Other:  11/11/20162:46 PM  Other:  11/11/20162:46 PM  Other:  11/11/20162:46 PM  Other:   11/11/20162:46 PM   Scribe for Treatment Team:   Keene Breath, MSW, LCSWA  04/02/2015, 2:46 PM

## 2015-04-02 NOTE — Progress Notes (Addendum)
D: Pt denies SI/HI/AVH, affect is flat and sad, thoughts are disorganized, speech tangential and pressured.  Pt is pleasant and cooperative with care. Patient appears less anxious, and he is interacting with peers and staff appropriately.  A: Pt was offered support and encouragement. Pt was given scheduled medications. Pt was encouraged to attend groups. Q 15 minute checks were done for safety.  R:Pt did not attend evening group. Pt is taking medication. Pt has no complaints.Pt receptive to treatment and safety maintained on unit.

## 2015-04-02 NOTE — Progress Notes (Signed)
Recreation Therapy Notes  Date: 11.11.16 Time: 3:00 pm Location: Craft Room  Group Topic: Communication, Problem Solving, Teamwork  Goal Area(s) Addresses:  Patient will effectively work with peer towards shared goal. Patient will identify skills used to make activity successful. Patient will identify benefit of using group skills effectively post d/c.  Behavioral Response: Did not attend  Intervention: [employer]  Activity: Patients were split into 2 groups and instructed to build the tallest free standing tower out of 15 pipe cleaners. Patients were given 2 minutes to determine a strategy. After patient's had been building for approximately 5 minutes, patients were instructed to put their dominant hand behind their back. After approximately 3 minutes of building, patients were instructed to stop talking to their teammates.    Education: LRT educated patients on how communication, problem solving, and teamwork relate to building their healthy support systems.  Education Outcome: Patient did not attend group.  Clinical Observations/Feedback: Patient did not attend group.  Leonette Monarch, LRT/CTRS 04/02/2015 4:35 PM

## 2015-04-02 NOTE — [facility] Group Notes (Addendum)
[facility] LCSW Aftercare Discharge Planning Group Note   04/02/2015 11:25 AM  Participation Quality:  Active  Mood/Affect:  Anxious and Appropriate  Thoughts of Suicide:  NA Will you contract for safety?   NA  Current AVH:  NA  Plan for Discharge/Comments:  Patient attended and participated in group discussion appropriately. Patient introduced himself and expressed difficulty in identifying a goal. Patient reported that he previously sustained a head injury after falling on his chin. He shared that he has not slept in "a year" and decided his goal is to get some sleep and speak with MD. Patient reported that he needs assistance obtaining housing.  Transportation Means: Patient was unable to identify any transportation means. CSW Intern discussed transportation resources.   Supports: Patient reported that he was assigned an ACT team in the past. He reported that they were not helpful. No additional forms of support were reported.    Christa See, Clinical Social Work Intern 04/02/15  Carmell Austria, MSW, LCSWA 04/02/15

## 2015-04-03 NOTE — Plan of Care (Signed)
Problem: Ineffective individual coping Goal: STG: Patient will remain free from self harm Outcome: Progressing Pt safe on the unit   Problem: Alteration in mood Goal: LTG-Patient reports reduction in suicidal thoughts (Patient reports reduction in suicidal thoughts and is able to verbalize a safety plan for whenever patient is feeling suicidal)  Outcome: Not Progressing Pt SI-contracts for safety at this time

## 2015-04-03 NOTE — Progress Notes (Signed)
Patient remains depressed , seclusive and some what  withdrawn.  He continues to have suicidal thoughts without any specific intent or plan. Pt denies A/V hallucinations. Pt states "I feel num" but can not explain further .

## 2015-04-03 NOTE — Progress Notes (Signed)
[facility] MD Progress Note  04/03/2015 2:22 PM Jeremiah [name]  MRN:  [id number]  Subjective:  Jeremiah [name] has a history of depression admitted for worsening depressionin the context of medication noncompliance and severe social stressors. The patient believes that his poblems started with head injury that he sustained in October 2015. This is undocumented. It could be delusional as he also believes that head injury brought about extreme constipation, insomnia and UTI. He also reports severe cognitive decline and inabiliy to attend to ADLs.  Patient remains very depressed and withdrawn. He talks constantly about how he had a head injury which has ruined his life because he can't do anything anymore. He continues to have suicidal thoughts without any specific intent or plan. Physically he complains of not being able to sleep at night and having no appetite.  Principal Problem: Major depressive disorder, recurrent episode, severe, with psychosis ([place]) Diagnosis:   Patient Active Problem List   Diagnosis Date Noted  . Major depressive disorder, recurrent episode, severe, with psychosis ([place]) [F33.3] 03/30/2015  . Homelessness [Z59.0] 03/29/2015  . Chronic constipation [K59.00] 03/29/2015  . 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 Cervical pain [M54.2] 01/18/2015  . Suicide threat or attempt [F48.9] 12/14/2014  . Cognitive and behavioral changes [R41.89, F91.9] 11/22/2014  . Microscopic hematuria [R31.29] 11/22/2014  . Disorder of ejaculation [N53.19] 11/22/2014  . 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 H/O autism spectrum disorder [Z86.59] 10/20/2014  . MDD (major depressive disorder), recurrent episode, with atypical features ([place]) [F33.9] 10/19/2014  . GAD (generalized anxiety disorder) [F41.1] 10/19/2014  . Tooth ache [K08.89] 10/19/2014  . Suicidal ideation [R45.851]   . Benign essential HTN [I10]  08/27/2014  . 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 Decreased potassium in the blood [E87.6] 12/05/2013  . 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 Prostatitis [N41.9] 12/05/2013  . 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 Schizophrenia ([place]) [F20.9] 12/05/2013   Total Time spent with patient: 20 minutes  Past Psychiatric History: depression.  Past Medical History:  Past Medical History  Diagnosis Date  . Hypertension   . Rectal fistula   . Dysuria   . Diverticulosis   . Gastroparesis   . Borderline personality disorder   . Major depression ([place])   . Schizophrenia ([place])   . Hypokalemia   . Microscopic hematuria   . External hemorrhoid   . Over weight   . Nicotine addiction   . Difficulty urinating   . Stroke ([place])   . Benign essential HTN 08/27/2014  . BP (high blood pressure) 01/16/2015  . Anxiety     Past Surgical History  Procedure Laterality Date  . Lumbar spine surgery    . Colonoscopy    . Colonoscopy with propofol N/A 10/12/2014    Procedure: COLONOSCOPY WITH PROPOFOL;  Surgeon: Hulen Luster, MD;  Location: [facility] ENDOSCOPY;  Service: Gastroenterology;  Laterality: N/A;  . Tonsillectomy    . Back surgery     Family History:  Family History  Problem Relation Age of Onset  . Stroke Mother   . Stroke Maternal Grandmother   . Diabetes Mellitus II Sister    Family Psychiatric  History: none reported. Social History:  History  Alcohol Use No    Comment: last drink was in march 2016     History  Drug Use No    Social History   Social History  . Marital Status: Single  Spouse Name: N/A  . Number of Children: N/A  . Years of Education: N/A   Social History Main Topics  . Smoking status: Former Smoker    Types: Cigarettes    Quit date: 08/19/2014  . Smokeless tobacco: None  . Alcohol Use: No     Comment: last drink was in march 2016  . Drug Use: No  . Sexual Activity: Not Asked   Other Topics Concern  . None   Social  History Narrative   Additional Social History:                         Sleep: Fair  Appetite:  Fair  Current Medications: Current Facility-Administered Medications  Medication Dose Route Frequency Provider Last Rate Last Dose  . acetaminophen (TYLENOL) tablet 650 mg  650 mg Oral Q6H PRN Gonzella Lex, MD      . alum & mag hydroxide-simeth (MAALOX/MYLANTA) 200-200-20 MG/5ML suspension 30 mL  30 mL Oral Q4H PRN Gonzella Lex, MD      . amLODipine (NORVASC) tablet 5 mg  5 mg Oral Daily Clovis Fredrickson, MD   5 mg at 04/03/15 0925  . ARIPiprazole (ABILIFY) tablet 10 mg  10 mg Oral QHS Clovis Fredrickson, MD   10 mg at 04/02/15 2147  . chlordiazePOXIDE (LIBRIUM) capsule 25 mg  25 mg Oral QID Clovis Fredrickson, MD   25 mg at 04/03/15 0925  . cloNIDine (CATAPRES) tablet 0.2 mg  0.2 mg Oral BID Clovis Fredrickson, MD   0.2 mg at 04/03/15 0934  . diphenhydrAMINE (BENADRYL) capsule 50 mg  50 mg Oral QHS Gonzella Lex, MD   50 mg at 04/02/15 2147  . docusate sodium (COLACE) capsule 200 mg  200 mg Oral BID Clovis Fredrickson, MD   200 mg at 04/03/15 0926  . FLUoxetine (PROZAC) capsule 40 mg  40 mg Oral Daily Clovis Fredrickson, MD   40 mg at 04/03/15 0924  . magnesium hydroxide (MILK OF MAGNESIA) suspension 30 mL  30 mL Oral Daily PRN Gonzella Lex, MD      . metoprolol succinate (TOPROL-XL) 24 hr tablet 100 mg  100 mg Oral Daily Jolanta B Pucilowska, MD   100 mg at 04/03/15 0924  . polyethylene glycol (MIRALAX / GLYCOLAX) packet 17 g  17 g Oral Daily Clovis Fredrickson, MD   17 g at 04/03/15 0924  . temazepam (RESTORIL) capsule 30 mg  30 mg Oral QHS PRN Clovis Fredrickson, MD   30 mg at 04/02/15 2147    Lab Results:  Results for orders placed or performed during the hospital encounter of 03/30/15 (from the past 48 hour(s))  Urine culture     Status: None (Preliminary result)   Collection Time: 04/02/15  5:00 AM  Result Value Ref Range   Specimen Description  URINE, CLEAN CATCH    Special Requests none    Culture NO GROWTH < 24 HOURS    Report Status PENDING     Physical Findings: AIMS:  , ,  ,  ,    CIWA:    COWS:     Musculoskeletal: Strength & Muscle Tone: within normal limits Gait & Station: normal Patient leans: N/A  Psychiatric Specialty Exam: Review of Systems  Constitutional: Positive for weight loss and malaise/fatigue.  HENT: Negative.   Eyes: Negative.   Respiratory: Negative.   Cardiovascular: Negative.   Gastrointestinal: Negative.   Musculoskeletal: Negative.  Skin: Negative.   Neurological: Positive for weakness.  Psychiatric/Behavioral: Positive for depression, suicidal ideas and memory loss. Negative for hallucinations and substance abuse. The patient is nervous/anxious and has insomnia.   All other systems reviewed and are negative.   Blood pressure 118/77, pulse 80, temperature 98.6 F (37 C), temperature source Oral, resp. rate 20, height 5\' 7"  (1.702 m), weight 67.132 kg (148 lb), SpO2 100 %.Body mass index is 23.17 kg/(m^2).  General Appearance: Disheveled  Eye Contact::  Minimal  Speech:  Slow  Volume:  Normal  Mood:  Anxious, Depressed, Hopeless and Worthless  Affect:  Blunt  Thought Process:  Irrelevant  Orientation:  Full (Time, Place, and Person)  Thought Content:  Delusions, Obsessions and Paranoid Ideation  Suicidal Thoughts:  Yes.  with intent/plan  Homicidal Thoughts:  No  Memory:  Immediate;   Poor Recent;   Poor Remote;   Poor  Judgement:  Poor  Insight:  Lacking  Psychomotor Activity:  Decreased  Concentration:  Poor  Recall:  Poor  Fund of Knowledge:Poor  Language: Fair  Akathisia:  No  Handed:  Right  AIMS (if indicated):     Assets:  Communication Skills Desire for Improvement Physical Health Resilience  ADL's:  Intact  Cognition: WNL  Sleep:  Number of Hours: 6   Treatment Plan Summary: Daily contact with patient to assess and evaluate symptoms and progress in  treatment and Medication management   Jeremiah [name] is a 49 year old male with a history of depression and psychosis admitting floridly psychotic and depressed in the context of medication noncompliance and severe social stressors.  1. Suicidal ideation. He is able to contract for safety in the hospital.  2. Mood. The patient has been treated in the past with a combination of Depakote and Zoloft. Depakote level was low on admission. He refuses to take Depakote. And believes that Prozac is better than Zoloft. Will started Prozac and Abilify for depression, psychosis, and mood stabilization. We increased Abilify to 10 mg for paranoid delusions. I will increase Prozac to 40 mg to address ruminations. Patient is tolerating medicine well. Supportive counseling completed. No indication to change medication right now.  3. Anxiety. The patient reportedly is prescribed Xanax. This was restarted in the emergency room. Given cognitive decline, I will discontinue Xanax and start Librium taper.  4. Hypertension. We restarted metoprolol, clonidine and Norvasc. His antihypertensives were held this morning due to low blood pressure. The patient reports that he has been taking blood pressure medication at home prior to admission but it is uncertain.  5. Constipation. It is unclear if this is delusional. Seems like constant complaint. Will start bowel regimen.  6. Insomnia. The patient claims not to be able to sleep for months. He slept well with Restoril.   7. Urinary tract infection. The patient has a history of prostatitis he believes that he needs to be on antibiotic. Urinalysis is not suggestive of urinary tract action but we will take urine culture.   8. Metabolic syndrome. Lipid panel is not elevated. TSH normal.   9. Disposition. To be established. He is homeless and uninsured, and without any support.   Supportive counseling done. Try to get him to think more positively about what he will do with his  life but he is so fixated and obsessive about his negative somatic complaints it is hard to get through. Continue on current level of observation.   John Clapacs 04/03/2015, 2:22 PM

## 2015-04-03 NOTE — Progress Notes (Signed)
D: Pt SI-contracts for safety denies HI/AVH. Pt is very depressed and very pessamistic. Pt continues to stress his head injury and things not going right since the accident. Pt spends majority of evening in room with no peer interaction.  A: Pt was offered support and encouragement. Pt was given scheduled medications. Pt was encourage to attend groups. Q 15 minute checks were done for safety.   R: Pt is taking medication. Pt receptive to treatment and safety maintained on unit.

## 2015-04-03 NOTE — BHH Group Notes (Signed)
[facility] LCSW Group Therapy  04/03/2015 1:01 PM  Type of Therapy:  Group Therapy  Participation Level:  Did Not Attend  Modes of Intervention:  Discussion, Education, Socialization and Support  Summary of Progress/Problems: Pt will identify unhealthy thoughts and how they impact their emotions and behavior. Pt will be encouraged to discuss these thoughts, emotions and behaviors with the group.   Sylvia MSW, [place]  04/03/2015, 1:01 PM

## 2015-04-04 LAB — URINE CULTURE: CULTURE: NO GROWTH

## 2015-04-04 MED ORDER — IBUPROFEN 600 MG PO TABS
600.0000 mg | ORAL_TABLET | Freq: Four times a day (QID) | ORAL | Status: DC | PRN
  Administered 2015-04-04: 600 mg via ORAL
  Filled 2015-04-04: qty 1

## 2015-04-04 NOTE — Progress Notes (Signed)
[facility] MD Progress Note  04/04/2015 1:18 PM Jeremiah [name]  MRN:  [id number]  Subjective:  Mr. Brenda has a history of depression admitted for worsening depressionin the context of medication noncompliance and severe social stressors. The patient believes that his poblems started with head injury that he sustained in October 2015. This is undocumented. It could be delusional as he also believes that head injury brought about extreme constipation, insomnia and UTI. He also reports severe cognitive decline and inabiliy to attend to ADLs.  Follow-up for this 49 year old man with atypical mood symptoms anxiety symptoms possible developmental disorder or possible effect of brain injury. Patient continues to complain of feeling terrible which she says is because of his brain injury. Talks about how he can't think anymore. Also talks about having a headache. He stays in bed all of the time doesn't get up doesn't interact with anyone else. Denies acute suicidal ideation but has passive hopelessness.  Principal Problem: Major depressive disorder, recurrent episode, severe, with psychosis ([place]) Diagnosis:   Patient Active Problem List   Diagnosis Date Noted  . Major depressive disorder, recurrent episode, severe, with psychosis ([place]) [F33.3] 03/30/2015  . Homelessness [Z59.0] 03/29/2015  . Chronic constipation [K59.00] 03/29/2015  . 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 Cervical pain [M54.2] 01/18/2015  . Suicide threat or attempt [F48.9] 12/14/2014  . Cognitive and behavioral changes [R41.89, F91.9] 11/22/2014  . Microscopic hematuria [R31.29] 11/22/2014  . Disorder of ejaculation [N53.19] 11/22/2014  . 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 H/O autism spectrum disorder [Z86.59] 10/20/2014  . MDD (major depressive disorder), recurrent episode, with atypical features ([place]) [F33.9] 10/19/2014  . GAD  (generalized anxiety disorder) [F41.1] 10/19/2014  . Tooth ache [K08.89] 10/19/2014  . Suicidal ideation [R45.851]   . Benign essential HTN [I10] 08/27/2014  . Affective bipolar disorder ([place]) [F31.9] 12/05/2013  . Decreased potassium in the blood [E87.6] 12/05/2013  . 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 Prostatitis [N41.9] 12/05/2013  . 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 Schizophrenia ([place]) [F20.9] 12/05/2013   Total Time spent with patient: 20 minutes  Past Psychiatric History: depression.  Past Medical History:  Past Medical History  Diagnosis Date  . Hypertension   . Rectal fistula   . Dysuria   . Diverticulosis   . Gastroparesis   . Borderline personality disorder   . Major depression ([place])   . Schizophrenia ([place])   . Hypokalemia   . Microscopic hematuria   . External hemorrhoid   . Over weight   . Nicotine addiction   . Difficulty urinating   . Stroke ([place])   . Benign essential HTN 08/27/2014  . BP (high blood pressure) 01/16/2015  . Anxiety     Past Surgical History  Procedure Laterality Date  . Lumbar spine surgery    . Colonoscopy    . Colonoscopy with propofol N/A 10/12/2014    Procedure: COLONOSCOPY WITH PROPOFOL;  Surgeon: Hulen Luster, MD;  Location: [facility] ENDOSCOPY;  Service: Gastroenterology;  Laterality: N/A;  . Tonsillectomy    . Back surgery     Family History:  Family History  Problem Relation Age of Onset  . Stroke Mother   . Stroke Maternal Grandmother   . Diabetes Mellitus II Sister    Family Psychiatric  History: none reported. Social History:  History  Alcohol Use No    Comment: last drink was in march 2016     History  Drug Use No    Social History   Social History  . Marital Status: Single    Spouse Name: N/A  . Number of Children: N/A  . Years of Education: N/A   Social History Main Topics  . Smoking status: Former Smoker    Types: Cigarettes    Quit date: 08/19/2014  . Smokeless tobacco: None  . Alcohol  Use: No     Comment: last drink was in march 2016  . Drug Use: No  . Sexual Activity: Not Asked   Other Topics Concern  . None   Social History Narrative   Additional Social History:                         Sleep: Fair  Appetite:  Fair  Current Medications: Current Facility-Administered Medications  Medication Dose Route Frequency Provider Last Rate Last Dose  . acetaminophen (TYLENOL) tablet 650 mg  650 mg Oral Q6H PRN Gonzella Lex, MD      . alum & mag hydroxide-simeth (MAALOX/MYLANTA) 200-200-20 MG/5ML suspension 30 mL  30 mL Oral Q4H PRN Gonzella Lex, MD      . amLODipine (NORVASC) tablet 5 mg  5 mg Oral Daily Clovis Fredrickson, MD   5 mg at 04/04/15 0906  . ARIPiprazole (ABILIFY) tablet 10 mg  10 mg Oral QHS Clovis Fredrickson, MD   10 mg at 04/03/15 2124  . chlordiazePOXIDE (LIBRIUM) capsule 25 mg  25 mg Oral QID Clovis Fredrickson, MD   25 mg at 04/04/15 0905  . cloNIDine (CATAPRES) tablet 0.2 mg  0.2 mg Oral BID Clovis Fredrickson, MD   0.2 mg at 04/03/15 2124  . diphenhydrAMINE (BENADRYL) capsule 50 mg  50 mg Oral QHS Gonzella Lex, MD   50 mg at 04/03/15 2125  . docusate sodium (COLACE) capsule 200 mg  200 mg Oral BID Clovis Fredrickson, MD   200 mg at 04/04/15 0903  . FLUoxetine (PROZAC) capsule 40 mg  40 mg Oral Daily Clovis Fredrickson, MD   40 mg at 04/04/15 0904  . ibuprofen (ADVIL,MOTRIN) tablet 600 mg  600 mg Oral Q6H PRN Gonzella Lex, MD      . magnesium hydroxide (MILK OF MAGNESIA) suspension 30 mL  30 mL Oral Daily PRN Gonzella Lex, MD      . metoprolol succinate (TOPROL-XL) 24 hr tablet 100 mg  100 mg Oral Daily Jolanta B Pucilowska, MD   100 mg at 04/04/15 0903  . polyethylene glycol (MIRALAX / GLYCOLAX) packet 17 g  17 g Oral Daily Clovis Fredrickson, MD   17 g at 04/04/15 0903  . temazepam (RESTORIL) capsule 30 mg  30 mg Oral QHS PRN Clovis Fredrickson, MD   30 mg at 04/03/15 2125    Lab Results:  No results found for  this or any previous visit (from the past 48 hour(s)).  Physical Findings: AIMS:  , ,  ,  ,    CIWA:    COWS:     Musculoskeletal: Strength & Muscle Tone: within normal limits Gait & Station: normal Patient leans: N/A  Psychiatric Specialty Exam: Review of Systems  Constitutional: Positive for weight loss and malaise/fatigue.  HENT: Negative.   Eyes: Negative.   Respiratory: Negative.   Cardiovascular: Negative.   Gastrointestinal: Negative.   Musculoskeletal: Negative.   Skin: Negative.   Neurological: Positive for weakness.  Psychiatric/Behavioral: Positive for depression and memory loss.  Negative for suicidal ideas, hallucinations and substance abuse. The patient is nervous/anxious and has insomnia.   All other systems reviewed and are negative.   Blood pressure 91/60, pulse 64, temperature 98 F (36.7 C), temperature source Oral, resp. rate 20, height 5\' 7"  (1.702 m), weight 67.132 kg (148 lb), SpO2 100 %.Body mass index is 23.17 kg/(m^2).  General Appearance: Disheveled  Eye Contact::  Minimal  Speech:  Slow  Volume:  Normal  Mood:  Anxious, Depressed, Hopeless and Worthless  Affect:  Blunt  Thought Process:  Irrelevant  Orientation:  Full (Time, Place, and Person)  Thought Content:  Delusions, Obsessions and Paranoid Ideation  Suicidal Thoughts:  Yes.  with intent/plan  Homicidal Thoughts:  No  Memory:  Immediate;   Poor Recent;   Poor Remote;   Poor  Judgement:  Poor  Insight:  Lacking  Psychomotor Activity:  Decreased  Concentration:  Poor  Recall:  Poor  Fund of Knowledge:Poor  Language: Fair  Akathisia:  No  Handed:  Right  AIMS (if indicated):     Assets:  Communication Skills Desire for Improvement Physical Health Resilience  ADL's:  Intact  Cognition: WNL  Sleep:  Number of Hours: 7   Treatment Plan Summary: Daily contact with patient to assess and evaluate symptoms and progress in treatment and Medication management   Mr. Hayden is a  49 year old male with a history of depression and psychosis admitting floridly psychotic and depressed in the context of medication noncompliance and severe social stressors.  1. Suicidal ideation. He is able to contract for safety in the hospital.  2. Mood. The patient has been treated in the past with a combination of Depakote and Zoloft. Depakote level was low on admission. He refuses to take Depakote. And believes that Prozac is better than Zoloft. Will started Prozac and Abilify for depression, psychosis, and mood stabilization. We increased Abilify to 10 mg for paranoid delusions. I will increase Prozac to 40 mg to address ruminations. Patient is tolerating medicine well. Supportive counseling completed. No indication to change medication right now.  3. Anxiety. The patient reportedly is prescribed Xanax. This was restarted in the emergency room. Given cognitive decline, I will discontinue Xanax and start Librium taper.  4. Hypertension. We restarted metoprolol, clonidine and Norvasc. His antihypertensives were held this morning due to low blood pressure. The patient reports that he has been taking blood pressure medication at home prior to admission but it is uncertain.  5. Constipation. It is unclear if this is delusional. Seems like constant complaint. Will start bowel regimen.  6. Insomnia. The patient claims not to be able to sleep for months. He slept well with Restoril.   7. Urinary tract infection. The patient has a history of prostatitis he believes that he needs to be on antibiotic. Urinalysis is not suggestive of urinary tract action but we will take urine culture.   8. Metabolic syndrome. Lipid panel is not elevated. TSH normal.   9. Disposition. To be established. He is homeless and uninsured, and without any support.   The only change to his medicine today is 2 at ibuprofen 600 mg every 6 hours as needed for headache. Once again encouraged patient to try and be up out of  bed and more interactive. No other change to medicine. Reviewed vital signs reviewed nursing notes interviewed patient.  John Clapacs 04/04/2015, 1:18 PM

## 2015-04-04 NOTE — Plan of Care (Signed)
Problem: Ineffective individual coping Goal: LTG: Patient will report a decrease in negative feelings Outcome: Not Progressing Patient is hopeless, thinking that it will never get better for him

## 2015-04-04 NOTE — Progress Notes (Signed)
Patient is currently isolative in room. Sad and depressed and hopeless " my depression has gotten worse ever since I had that brain injury.. nothing can help me".  Patient is cooperative and expresses his feeling freely.  Denies suicidal thoughts. Quiet but cooperative on approach. Emotional support and encouragements offered and safety precautions maintained.

## 2015-04-04 NOTE — Progress Notes (Signed)
Patient remains withdrawn in room, sleepy in bed. Takes prn Motrin for headache, and noted to be sleeping after med. Eats meals in dining room, minimal interaction with peers. Safety maintained.

## 2015-04-04 NOTE — Plan of Care (Signed)
Problem: Alteration in mood; excessive anxiety as evidenced by: Goal: STG-Pt will report an absence of self-harm thoughts/actions (Patient will report an absence of self-harm thoughts or actions)  Outcome: Progressing Patient denies suicidal thoughts

## 2015-04-04 NOTE — BHH Group Notes (Signed)
[place] LCSW Group Therapy  04/04/2015 9:23 AM  Type of Therapy:  Group Therapy  Participation Level:  Did Not Attend  Modes of Intervention:  Discussion, Education, Socialization and Support  Summary of Progress/Problems: Todays topic: Grudges  Patients will be encouraged to discuss their thoughts, feelings, and behaviors as to why one holds on to grudges and reasons why people have grudges. Patients will process the impact of grudges on their daily lives and identify thoughts and feelings related to holding grudges. Patients will identify feelings and thoughts related to what life would look like without grudges.   [place]  MSW, [place]   04/04/2015, 9:23 AM

## 2015-04-04 NOTE — Progress Notes (Signed)
Patient with depressed affect, cooperative behavior with meals, meds and plan of care. Vital signs monitored and recorded with meds as ordered. Minimal interaction with peers. Denies SI/HI at this time. Disorganized and noted to be climbing on bed in room. Redirected with good effect. Safety maintained.

## 2015-04-04 NOTE — Plan of Care (Signed)
Problem: Consults Goal: [facility] General Treatment Patient Education Outcome: Progressing Cooperative with meds and plan of care.

## 2015-04-05 MED ORDER — TESTOSTERONE ENANTHATE 200 MG/ML IM SOLN
200.0000 mg | INTRAMUSCULAR | Status: DC
  Administered 2015-04-06: 200 mg via INTRAMUSCULAR
  Filled 2015-04-05 (×2): qty 1

## 2015-04-05 MED ORDER — FLUOXETINE HCL 20 MG PO CAPS
60.0000 mg | ORAL_CAPSULE | Freq: Every day | ORAL | Status: DC
  Administered 2015-04-06 – 2015-04-08 (×3): 60 mg via ORAL
  Filled 2015-04-05 (×3): qty 3

## 2015-04-05 NOTE — Plan of Care (Signed)
Problem: Alteration in mood Goal: LTG-Patient reports reduction in suicidal thoughts (Patient reports reduction in suicidal thoughts and is able to verbalize a safety plan for whenever patient is feeling suicidal)  Outcome: Not Progressing Pt has passive SI. Pt is expressing hopelessness stating "Wants the point of all this" and similar such statements. Pt contracts for safety.

## 2015-04-05 NOTE — Plan of Care (Signed)
Problem: Alteration in mood Goal: LTG-Patient reports reduction in suicidal thoughts (Patient reports reduction in suicidal thoughts and is able to verbalize a safety plan for whenever patient is feeling suicidal)  Outcome: Progressing Denies suicidal ideation.     

## 2015-04-05 NOTE — Progress Notes (Signed)
Recreation Therapy Notes  Date: 11.14.16 Time: 3:00 pm Location: Craft Room  Group Topic: Wellness  Goal Area(s) Addresses:  Patient will identify at least one item per dimension of health. Patient will examine areas they are deficient in.  Behavioral Response: Did not attend  Intervention: 6 Dimensions of Health  Activity: Patients were given a worksheet with the definitions of the 6 dimensions of health on it. Patients were given a worksheet with the 6 dimensions on it and instructed to list 2-3 things they were currently doing per each dimension.   Education: LRT educated patients on ways they can increase each dimension.  Education Outcome: Patient did not attend group.  Clinical Observations/Feedback: Patient did not attend group.  Leonette Monarch, LRT/CTRS 04/05/2015 4:17 PM

## 2015-04-05 NOTE — Progress Notes (Signed)
D: Observed pt lying in bed. Patient alert and oriented x4. Pt endorses SI, without an attainable plan. Pt wants to go to another country to get "euthanized". Pt verbally contracted for safety.  Patient denies HI/AVH. Pt disheveled. Pt affect blunted and mood is depressed and hopeless. Pt speech is tangential, and pt pre-occupied with head injury, pupils not dilating, and lack of sleep. Pt expresses hopelessness stating "what's the point" and similar such statements. Pt calm and cooperative. Pt pupils appear to be dilating appropriately and pupil size noted to be approximately 62mm. Pt asked for PRN medication for sleep. Pt not attending group stating "my mind just can't do it." Pt endorses lack of energy, difficulty concentrating, lack of focus, and difficulty having restful sleep. A: Provided active listening and support. Administered scheduled medications. Gave Restoril PRN for sleep.  Provided therapeutic communication. Encouraged pt to attend groups and participate in care. Encouraged pt to make small goals that pt can work towards. R: Pt verbalized understanding of encouragement, but denied that he would be able to attend groups. Pt remains calm and cooperative. Will continue Q15 min. checks.

## 2015-04-05 NOTE — BHH Group Notes (Signed)
[facility] Group Notes:  (Nursing/MHT/Case Management/Adjunct)  Date:  04/05/2015  Time:  1:05 AM  Type of Therapy:  Group Therapy  Participation Level:  Did Not Attend   Summary of Progress/Problems:  Jeremiah [name] 04/05/2015, 1:05 AM

## 2015-04-05 NOTE — BHH Group Notes (Signed)
[facility] Group Notes:  (Nursing/MHT/Case Management/Adjunct)  Date:  04/05/2015  Time:  1:31 PM  Type of Therapy:  Psychoeducational Skills  Participation Level:  Did Not Attend    Drake Leach 04/05/2015, 1:31 PM

## 2015-04-05 NOTE — Consult Note (Signed)
  Psychological Assessment   Name: Jeremiah [name] Age: 49 Date of Evaluation: 04-05-15 Test(s) Administered: Jeremiah [name] Gestalt  Trail Making Part A & B  Reason for Referral: Jeremiah [name] was referred for a psychological assessment by his physician, Jeremiah Slick, MD.  He was admitted to Jeremiah [name] for the treatment of depression and somatic complaints. Please see the history and physical and psychosocial history for further background information. He has a long psychiatric history and has been diagnosed with major depression, schizophrenia, bipolar disorder, autism spectrum disorder and personality disorder.  He reports a fall last October and increased confusion, and problems with memory since then. He reports he stopped driving because he felt he had short term memory problems. He has not contacted any professional for help with this issues. A neuro psychological screening was requested to determine if cognitive deficits may be contributing to his present difficulties.  Validity: Jeremiah [name] was pleasant and cooperative with the testing process. He attempted all tasks requested of him and appeared to try his best. The present evaluation is considered a valid indication of current functioning.  Results of Testing:  Jeremiah [name] - Jeremiah [name] did not obtain any errors on this assessment. His performance was within expected limits with no evidence of brain impairment.  Trail Making Test - Part A: He completed the task in 36 seconds. Expected performance is 27-39 seconds. No evidence of brain impairment is noted.  Trail Making Test - Part B: He completed the task in 88 seconds. Expected performance is 66-86 seconds. He placed in the mild to moderate brain impairment category. Given his performance on the other assessments this score is not suggestive of brain impairment.  Diagnostic Impression: No evidence of brain impairment

## 2015-04-05 NOTE — Progress Notes (Signed)
Patient currently resting in room with no issues or complaints during this shift. Q 15 min checks maintained and no unsafe behaviors noted. Will continue to monitor.

## 2015-04-05 NOTE — Progress Notes (Signed)
   04/05/15 0915  Clinical Encounter Type  Visited With Patient  Visit Type Initial;Spiritual support;Behavioral Health  Referral From Nurse  Consult/Referral To Chaplain  Spiritual Encounters  Spiritual Needs Prayer;Emotional  Stress Factors  Patient Stress Factors Exhausted;Health changes;Loss of control  Met w/patient who complained of physical exhaustion due to sleep deprivation. Stated that he had not slept soundly in past 3 years and had not dreamed in past year. Described TBI received from a fall in bathroom. Patient harbors guilt from past actions and is seeking relief. Conducted life review and received sacramental confession. Advised pt. that I will follow up tomorrow to get his report on sleep. Chap. Saketh Daubert G. [place]

## 2015-04-05 NOTE — Progress Notes (Signed)
He is pleasant and cooperative on approach.Still verbalized concern about sleeplessness and helplessness about the things he wanted to go.Denies suicidal ideation.Did not attend groups.Compliant with medications.

## 2015-04-05 NOTE — Progress Notes (Signed)
[facility] MD Progress Note  04/05/2015 1:08 PM Jeremiah [name]  MRN:  [id number]  Subjective:  Jeremiah [name] is still very depressed and suicidal. He has not been getting out of bed. He is his room for medications and food only. He again tells me 5 times that all his problems started with head injury. He feels that it affects his mental health, energy, but mostly intellect. He is awaiting OT consult as he believes Jeremiah [name] is completely dysfunctional and unable to attend to his ADLs. He will also have psychological evaluation for presumed dementia. He accepts medications and tolerates them well. He complains of headache. Jeremiah [name] tells me that last year when Jeremiah [name] was still working he received injections of testosterone and they were very helpful. We will order testosterone injection.  Principal Problem: Major depressive disorder, recurrent episode, severe, with psychosis ([place]) Diagnosis:   Patient Active Problem List   Diagnosis Date Noted  . Major depressive disorder, recurrent episode, severe, with psychosis ([place]) [F33.3] 03/30/2015  . Homelessness [Z59.0] 03/29/2015  . Chronic constipation [K59.00] 03/29/2015  . 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 Cervical pain [M54.2] 01/18/2015  . Suicide threat or attempt [F48.9] 12/14/2014  . Cognitive and behavioral changes [R41.89, F91.9] 11/22/2014  . Microscopic hematuria [R31.29] 11/22/2014  . Disorder of ejaculation [N53.19] 11/22/2014  . 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 H/O autism spectrum disorder [Z86.59] 10/20/2014  . MDD (major depressive disorder), recurrent episode, with atypical features ([place]) [F33.9] 10/19/2014  . GAD (generalized anxiety disorder) [F41.1] 10/19/2014  . Tooth ache [K08.89] 10/19/2014  . Suicidal ideation [R45.851]   . Benign essential HTN [I10] 08/27/2014  . Affective bipolar disorder ([place]) [F31.9] 12/05/2013  . Decreased  potassium in the blood [E87.6] 12/05/2013  . 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 Prostatitis [N41.9] 12/05/2013  . 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 Schizophrenia ([place]) [F20.9] 12/05/2013   Total Time spent with patient: 20 minutes  Past Psychiatric History: Depression, status post traumatic brain injury.  Past Medical History:  Past Medical History  Diagnosis Date  . Hypertension   . Rectal fistula   . Dysuria   . Diverticulosis   . Gastroparesis   . Borderline personality disorder   . Major depression ([place])   . Schizophrenia ([place])   . Hypokalemia   . Microscopic hematuria   . External hemorrhoid   . Over weight   . Nicotine addiction   . Difficulty urinating   . Stroke ([place])   . Benign essential HTN 08/27/2014  . BP (high blood pressure) 01/16/2015  . Anxiety     Past Surgical History  Procedure Laterality Date  . Lumbar spine surgery    . Colonoscopy    . Colonoscopy with propofol N/A 10/12/2014    Procedure: COLONOSCOPY WITH PROPOFOL;  Surgeon: Hulen Luster, MD;  Location: [facility] ENDOSCOPY;  Service: Gastroenterology;  Laterality: N/A;  . Tonsillectomy    . Back surgery     Family History:  Family History  Problem Relation Age of Onset  . Stroke Mother   . Stroke Maternal Grandmother   . Diabetes Mellitus II Sister    Family Psychiatric  History: None reported. Social History:  History  Alcohol Use No    Comment: last drink was in march 2016     History  Drug Use No    Social History   Social History  . Marital Status: Single    Spouse  Name: N/A  . Number of Children: N/A  . Years of Education: N/A   Social History Main Topics  . Smoking status: Former Smoker    Types: Cigarettes    Quit date: 08/19/2014  . Smokeless tobacco: None  . Alcohol Use: No     Comment: last drink was in march 2016  . Drug Use: No  . Sexual Activity: Not Asked   Other Topics Concern  . None   Social History Narrative   Additional Social History:                          Sleep: Poor  Appetite:  Poor  Current Medications: Current Facility-Administered Medications  Medication Dose Route Frequency Provider Last Rate Last Dose  . acetaminophen (TYLENOL) tablet 650 mg  650 mg Oral Q6H PRN Gonzella Lex, MD      . alum & mag hydroxide-simeth (MAALOX/MYLANTA) 200-200-20 MG/5ML suspension 30 mL  30 mL Oral Q4H PRN Gonzella Lex, MD      . amLODipine (NORVASC) tablet 5 mg  5 mg Oral Daily Shean Gerding B Paiden Cavell, MD   5 mg at 04/05/15 1023  . ARIPiprazole (ABILIFY) tablet 10 mg  10 mg Oral QHS Clovis Fredrickson, MD   10 mg at 04/04/15 2112  . chlordiazePOXIDE (LIBRIUM) capsule 25 mg  25 mg Oral QID Clovis Fredrickson, MD   25 mg at 04/05/15 1023  . cloNIDine (CATAPRES) tablet 0.2 mg  0.2 mg Oral BID Clovis Fredrickson, MD   0.2 mg at 04/04/15 2113  . diphenhydrAMINE (BENADRYL) capsule 50 mg  50 mg Oral QHS Gonzella Lex, MD   50 mg at 04/04/15 2113  . docusate sodium (COLACE) capsule 200 mg  200 mg Oral BID Shamere Dilworth B Ahmoni Edge, MD   200 mg at 04/05/15 1023  . FLUoxetine (PROZAC) capsule 40 mg  40 mg Oral Daily Kamaya Keckler B Danetta Prom, MD   40 mg at 04/05/15 1022  . ibuprofen (ADVIL,MOTRIN) tablet 600 mg  600 mg Oral Q6H PRN Gonzella Lex, MD   600 mg at 04/04/15 1444  . magnesium hydroxide (MILK OF MAGNESIA) suspension 30 mL  30 mL Oral Daily PRN Gonzella Lex, MD      . metoprolol succinate (TOPROL-XL) 24 hr tablet 100 mg  100 mg Oral Daily Lenore Moyano B Derhonda Eastlick, MD   100 mg at 04/05/15 1023  . polyethylene glycol (MIRALAX / GLYCOLAX) packet 17 g  17 g Oral Daily Clovis Fredrickson, MD   17 g at 04/04/15 0903  . temazepam (RESTORIL) capsule 30 mg  30 mg Oral QHS PRN Clovis Fredrickson, MD   30 mg at 04/04/15 2115  . testosterone enanthate (DELATESTRYL) injection 200 mg  200 mg Intramuscular Q28 days Clovis Fredrickson, MD        Lab Results: No results found for this or any previous visit (from the past 48  hour(s)).  Physical Findings: AIMS:  , ,  ,  ,    CIWA:    COWS:     Musculoskeletal: Strength & Muscle Tone: within normal limits Gait & Station: normal Patient leans: Backward  Psychiatric Specialty Exam: Review of Systems  Constitutional: Positive for malaise/fatigue.  Neurological: Positive for weakness and headaches.  All other systems reviewed and are negative.   Blood pressure 94/60, pulse 59, temperature 97.9 F (36.6 C), temperature source Oral, resp. rate 20, height 5\' 7"  (1.702 m), weight 67.132 kg (  148 lb), SpO2 100 %.Body mass index is 23.17 kg/(m^2).  General Appearance: Disheveled  Eye Contact::  Poor  Speech:  Slow  Volume:  Decreased  Mood:  Depressed, Hopeless and Worthless  Affect:  Blunt  Thought Process:  Irrelevant  Orientation:  Full (Time, Place, and Person)  Thought Content:  Delusions and Paranoid Ideation  Suicidal Thoughts:  No  Homicidal Thoughts:  No  Memory:  Immediate;   Poor Recent;   Poor Remote;   Poor  Judgement:  Poor  Insight:  Lacking  Psychomotor Activity:  Decreased  Concentration:  Poor  Recall:  Poor  Fund of Knowledge:Fair  Language: Fair  Akathisia:  No  Handed:  Right  AIMS (if indicated):     Assets:  Communication Skills Desire for Improvement Resilience  ADL's:  Intact  Cognition: WNL  Sleep:  Number of Hours: 7.75   Treatment Plan Summary: Daily contact with patient to assess and evaluate symptoms and progress in treatment and Medication management   Jeremiah [name] is a 49 year old male with a history of depression and psychosis admitting floridly psychotic and depressed in the context of medication noncompliance and severe social stressors.  1. Suicidal ideation. The patient is still suicidal. He is able to contract for safety in the hospital.  2. Mood. The patient has been treated in the past with a combination of Depakote and Zoloft. Depakote level was low on admission. He refuses to take Depakote. And  believes that Prozac is better than Zoloft. Will started Prozac and Abilify for depression, psychosis, and mood stabilization. We increased Abilify to 10 mg for paranoid delusions. I will increase Prozac to 60 mg to address ruminations. Patient is tolerating medicine well. Supportive counseling completed. No indication to change medication right now.  3. Anxiety. The patient reportedly is prescribed Xanax. This was restarted in the emergency room. Given cognitive decline, I will discontinue Xanax and start Librium taper.  4. Hypertension. We restarted metoprolol, clonidine and Norvasc. His antihypertensives were held this morning due to low blood pressure. The patient reports that he has been taking blood pressure medication at home prior to admission but it is uncertain. We had antihypertensives this morning due to low blood pressure.  5. Constipation. It is unclear if this is delusional. Seems like constant complaint. The patient is on bowel regimen. He reports feeling less constipated with improved appetite.  6. Insomnia. The patient claims not to be able to sleep for months. He sleeps better with Restoril but is not rested in the morning.    7. Urinary tract infection. The patient has a history of prostatitis he believes that he needs to be on antibiotic. Urinalysis is not suggestive of urinary tract infection, urine culture is negative.  8. Metabolic syndrome. Lipid panel is not elevated. TSH normal.   9. Headaches. This is a new complaint over the weekend.the patient now claims that he's had headaches since his head injury in October 2015. This is addressed with ibuprofen.   10. ADLs. Will order occupational therapy consult.   11. Cognitive decline. We ordered psychological consultation.    12. Hypogonadism. Will order testosterone injections.   13. Disposition. To be established. He is homeless and uninsured, and without any support.     Jeremiah [name] 04/05/2015, 1:08 PM

## 2015-04-06 MED ORDER — ARIPIPRAZOLE ER 400 MG IM SUSR
400.0000 mg | INTRAMUSCULAR | Status: DC
  Administered 2015-04-06: 400 mg via INTRAMUSCULAR
  Filled 2015-04-06: qty 400

## 2015-04-06 MED ORDER — ARIPIPRAZOLE 10 MG PO TABS
20.0000 mg | ORAL_TABLET | Freq: Every day | ORAL | Status: DC
  Administered 2015-04-06 – 2015-04-14 (×9): 20 mg via ORAL
  Filled 2015-04-06 (×9): qty 2

## 2015-04-06 NOTE — BHH Group Notes (Signed)
[facility] LCSW Group Therapy  04/06/2015 2:17 PM  Type of Therapy:  Group Therapy  Participation Level:  Did Not Attend   Keene Breath, MSW, LCSWA 04/06/2015, 2:17 PM

## 2015-04-06 NOTE — Progress Notes (Signed)
Recreation Therapy Notes  Date: 11.15.16 Time: 3:00 pm Location: Craft Room  Group Topic: Self-expression  Goal Area(s) Addresses:  Patient will be able to identify a color that represents each emotion. Patient will verbalize benefit of using art as a means of self-expression. Patient will verbalize one positive emotion experienced while participating in activity.  Behavioral Response: Did not attend  Intervention: The Colors Within Me  Activity: Patients were given a blank face worksheet and instructed to pick a color for each emotion they were feeling and to show how much of that emotion they were feeling on the worksheet.  Education: LRT educated patients on different forms of self-expression.  Education Outcome: Patient did not attend group.   Clinical Observations/Feedback: Patient did not attend group.  Leonette Monarch, LRT/CTRS 04/06/2015 4:25 PM

## 2015-04-06 NOTE — Tx Team (Signed)
Interdisciplinary Treatment Plan Update (Adult)  Date:  04/06/2015 Time Reviewed:  2:14 PM  Progress in Treatment: Attending groups: No. Participating in groups:  No. Taking medication as prescribed:  Yes. Tolerating medication:  Yes. Family/Significant othe contact made:  No, will contact:  if patient provides consent Patient understands diagnosis:  Yes. Discussing patient identified problems/goals with staff:  Yes. Medical problems stabilized or resolved:  Yes. Denies suicidal/homicidal ideation: Yes. Issues/concerns per patient self-inventory:  No. Other:  New problem(s) identified: No, Describe:  none reported  Discharge Plan or Barriers: Patient is currently homeless, depressed, SI, uninsured and states problems started after head injury 2 years ago. Patient will stabilize on medications and is offered housing shelter info and will need mental health followup at discharge. Patient will be referred to [facility] as he continues to have delusions and depression and probably is not a good candidate for ECT with head injury.  Reason for Continuation of Hospitalization: Delusions  Depression Suicidal ideation  Comments:   Estimated length of stay:up to 7 days expected discharge Tuesday 04/13/15   New goal(s):  Review of initial/current patient goals per problem list:   1.  Goal(s):decrease depression   Met:  No  Target date:by discharge  2.  Goal (s):eliminate SI  Met:  No  Target date:by discharge  3.  Goal(s):delusions will be manageable  Met:  No  Target date:by discharge  Attendees: Physician:  Orson Slick, MD 11/15/20162:14 PM  Nursing:   Tyler Pita, RN 11/15/20162:14 PM  Other:  Carmell Austria, LCSWA 11/15/20162:14 PM  Other:   11/15/20162:14 PM  Other:   11/15/20162:14 PM  Other:  11/15/20162:14 PM  Other:  11/15/20162:14 PM  Other:  11/15/20162:14 PM  Other:  11/15/20162:14 PM  Other:  11/15/20162:14 PM  Other:  11/15/20162:14 PM  Other:    11/15/20162:14 PM   Scribe for Treatment Team:   Keene Breath, MSW, LCSWA  04/06/2015, 2:14 PM

## 2015-04-06 NOTE — Progress Notes (Signed)
Patient remains isolative and depressed. Endorses passive SI. Contracts for safety. Focused on physical ailments. States he slept better last night. Has disheveled appearance. Low energy level and poor concentration. Taking meds as ordered. 15 minute checks maintained. Will continue to monitor.

## 2015-04-06 NOTE — Progress Notes (Signed)
[facility] MD Progress Note  04/06/2015 11:52 AM Jeremiah [name]  MRN:  [id number]  Subjective:  [name] reports that his sleep last night but is still unable to get out of bed, shower or shave. He complains of low energy. He is surprised that he did well on psychological testing for dementia yesterday. She still complains of depression and suicidal thoughts. He fees hopeless and worthless and unable to function. He still complains of constipation but is getting better. He still complains of headache but less severe than yesterday. He is still awaiting occupational therapy consult.  Principal Problem: Major depressive disorder, recurrent episode, severe, with psychosis ([place]) Diagnosis:   Patient Active Problem List   Diagnosis Date Noted  . Major depressive disorder, recurrent episode, severe, with psychosis ([place]) [F33.3] 03/30/2015  . Homelessness [Z59.0] 03/29/2015  . Chronic constipation [K59.00] 03/29/2015  . 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 Cervical pain [M54.2] 01/18/2015  . Suicide threat or attempt [F48.9] 12/14/2014  . Cognitive and behavioral changes [R41.89, F91.9] 11/22/2014  . Microscopic hematuria [R31.29] 11/22/2014  . Disorder of ejaculation [N53.19] 11/22/2014  . 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 H/O autism spectrum disorder [Z86.59] 10/20/2014  . MDD (major depressive disorder), recurrent episode, with atypical features ([place]) [F33.9] 10/19/2014  . GAD (generalized anxiety disorder) [F41.1] 10/19/2014  . Tooth ache [K08.89] 10/19/2014  . Suicidal ideation [R45.851]   . Benign essential HTN [I10] 08/27/2014  . Affective bipolar disorder ([place]) [F31.9] 12/05/2013  . Decreased potassium in the blood [E87.6] 12/05/2013  . 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 Prostatitis [N41.9] 12/05/2013  . 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 Schizophrenia ([place])  [F20.9] 12/05/2013   Total Time spent with patient: 20 minutes  Past Psychiatric History: Psychotic depression.  Past Medical History:  Past Medical History  Diagnosis Date  . Hypertension   . Rectal fistula   . Dysuria   . Diverticulosis   . Gastroparesis   . Borderline personality disorder   . Major depression ([place])   . Schizophrenia ([place])   . Hypokalemia   . Microscopic hematuria   . External hemorrhoid   . Over weight   . Nicotine addiction   . Difficulty urinating   . Stroke ([place])   . Benign essential HTN 08/27/2014  . BP (high blood pressure) 01/16/2015  . Anxiety     Past Surgical History  Procedure Laterality Date  . Lumbar spine surgery    . Colonoscopy    . Colonoscopy with propofol N/A 10/12/2014    Procedure: COLONOSCOPY WITH PROPOFOL;  Surgeon: Hulen Luster, MD;  Location: [facility] ENDOSCOPY;  Service: Gastroenterology;  Laterality: N/A;  . Tonsillectomy    . Back surgery     Family History:  Family History  Problem Relation Age of Onset  . Stroke Mother   . Stroke Maternal Grandmother   . Diabetes Mellitus II Sister    Family Psychiatric  History: None reported. Social History:  History  Alcohol Use No    Comment: last drink was in march 2016     History  Drug Use No    Social History   Social History  . Marital Status: Single    Spouse Name: N/A  . Number of Children: N/A  . Years of Education: N/A   Social History Main Topics  . Smoking status: Former Smoker    Types: Cigarettes    Quit date: 08/19/2014  .  Smokeless tobacco: None  . Alcohol Use: No     Comment: last drink was in march 2016  . Drug Use: No  . Sexual Activity: Not Asked   Other Topics Concern  . None   Social History Narrative   Additional Social History:                         Sleep: Fair  Appetite:  Fair  Current Medications: Current Facility-Administered Medications  Medication Dose Route Frequency Provider Last Rate Last Dose  . acetaminophen  (TYLENOL) tablet 650 mg  650 mg Oral Q6H PRN Gonzella Lex, MD   650 mg at 04/05/15 1739  . alum & mag hydroxide-simeth (MAALOX/MYLANTA) 200-200-20 MG/5ML suspension 30 mL  30 mL Oral Q4H PRN Gonzella Lex, MD      . amLODipine (NORVASC) tablet 5 mg  5 mg Oral Daily Clovis Fredrickson, MD   5 mg at 04/06/15 0924  . ARIPiprazole (ABILIFY) tablet 20 mg  20 mg Oral QHS Jolanta B Pucilowska, MD      . diphenhydrAMINE (BENADRYL) capsule 50 mg  50 mg Oral QHS Gonzella Lex, MD   50 mg at 04/05/15 2147  . docusate sodium (COLACE) capsule 200 mg  200 mg Oral BID Clovis Fredrickson, MD   200 mg at 04/06/15 0923  . FLUoxetine (PROZAC) capsule 60 mg  60 mg Oral Daily Clovis Fredrickson, MD   60 mg at 04/06/15 0923  . ibuprofen (ADVIL,MOTRIN) tablet 600 mg  600 mg Oral Q6H PRN Gonzella Lex, MD   600 mg at 04/04/15 1444  . magnesium hydroxide (MILK OF MAGNESIA) suspension 30 mL  30 mL Oral Daily PRN Gonzella Lex, MD      . metoprolol succinate (TOPROL-XL) 24 hr tablet 100 mg  100 mg Oral Daily Jolanta B Pucilowska, MD   100 mg at 04/06/15 0924  . polyethylene glycol (MIRALAX / GLYCOLAX) packet 17 g  17 g Oral Daily Clovis Fredrickson, MD   17 g at 04/06/15 0923  . temazepam (RESTORIL) capsule 30 mg  30 mg Oral QHS PRN Clovis Fredrickson, MD   30 mg at 04/05/15 2151  . testosterone enanthate (DELATESTRYL) injection 200 mg  200 mg Intramuscular Q28 days Clovis Fredrickson, MD   200 mg at 04/06/15 1143    Lab Results: No results found for this or any previous visit (from the past 48 hour(s)).  Physical Findings: AIMS:  , ,  ,  ,    CIWA:    COWS:     Musculoskeletal: Strength & Muscle Tone: within normal limits Gait & Station: normal Patient leans: N/A  Psychiatric Specialty Exam: Review of Systems  Constitutional: Positive for weight loss and malaise/fatigue.  Gastrointestinal: Positive for constipation.  Neurological: Positive for weakness and headaches.  All other systems  reviewed and are negative.   Blood pressure 94/65, pulse 64, temperature 97.9 F (36.6 C), temperature source Oral, resp. rate 18, height 5\' 7"  (1.702 m), weight 67.132 kg (148 lb), SpO2 100 %.Body mass index is 23.17 kg/(m^2).  General Appearance: Disheveled  Eye Contact::  Minimal  Speech:  Clear and Coherent  Volume:  Decreased  Mood:  Depressed, Hopeless and Worthless  Affect:  Blunt  Thought Process:  Disorganized  Orientation:  Full (Time, Place, and Person)  Thought Content:  Delusions and Paranoid Ideation  Suicidal Thoughts:  Yes.  with intent/plan  Homicidal Thoughts:  No  Memory:  Immediate;   Poor Recent;   Poor Remote;   Poor  Judgement:  Impaired  Insight:  Lacking  Psychomotor Activity:  Decreased  Concentration:  Poor  Recall:  Poor  Fund of Knowledge:Poor  Language: Fair  Akathisia:  No  Handed:  Right  AIMS (if indicated):     Assets:  Communication Skills Desire for Improvement Physical Health Resilience  ADL's:  Intact  Cognition: WNL  Sleep:  Number of Hours: 6   Treatment Plan Summary: Daily contact with patient to assess and evaluate symptoms and progress in treatment and Medication management   Mr. Getto is a 49 year old male with a history of depression and psychosis admitting floridly psychotic and depressed in the context of medication noncompliance and severe social stressors.  1. Suicidal ideation. The patient is still suicidal. He is able to contract for safety in the hospital.  2. Mood. The patient has been treated in the past with a combination of Depakote and Zoloft. Depakote level was low on admission. He refuses to take Depakote. And believes that Prozac is better than Zoloft. Will started Prozac and Abilify for depression, psychosis, and mood stabilization. We will  increase Abilify to 20 mg for paranoid delusions. Her agreed to start Abilify Maintena injections. We increased Prozac to 60 mg to address ruminations. Patient is tolerating  medicine well. Supportive counseling completed.   3. Anxiety. The patient reportedly is prescribed Xanax. This was restarted in the emergency room. Given cognitive decline, I will discontinue Xanax and start Librium taper.  4. Hypertension. We restarted metoprolol, clonidine and Norvasc. Blood pressure was low again this morning we will stop clonidine.   5. Constipation. It is unclear if this is delusional. Seems like constant complaint. The patient is on bowel regimen. He reports feeling less constipated with improved appetite.  6. Insomnia. The patient claims not to be able to sleep for months. He sleeps better with Restoril.    7. Urinary tract infection. The patient has a history of prostatitis he believes that he needs to be on antibiotic. Urinalysis is not suggestive of urinary tract infection, urine culture is negative.  8. Metabolic syndrome. Lipid panel is not elevated. TSH normal.   9. Headaches. This is a new complaint over the weekend.the patient now claims that he's had headaches since his head injury in October 2015. This is addressed with ibuprofen.   10. ADLs. Will order occupational therapy consult.   11. Cognitive decline. Psychological consultation completed. Pplease see results.   12. Hypogonadism. We ordered testosterone injections.   13. Disposition. To be established. He is homeless and uninsured, and without any support.     Jolanta Pucilowska 04/06/2015, 11:52 AM

## 2015-04-07 MED ORDER — TRAZODONE HCL 100 MG PO TABS
200.0000 mg | ORAL_TABLET | Freq: Every day | ORAL | Status: DC
  Administered 2015-04-07: 200 mg via ORAL
  Filled 2015-04-07: qty 2

## 2015-04-07 NOTE — BHH Group Notes (Signed)
[facility] LCSW Aftercare Discharge Planning Group Note   04/07/2015 11:27 AM  Patient did not attend.  Christa See, Clinical Social Work Intern 04/07/15  Carmell Austria, MSW, LCSWA 04/07/15

## 2015-04-07 NOTE — Progress Notes (Signed)
Recreation Therapy Notes  Date: 11.16.16 Time: 3:00 pm Location: Craft Room  Group Topic: Self-esteem  Goal Area(s) Addresses:  Patient will write down at least one positive trait. Patient will verbalize benefit of having a healthy self-esteem.  Behavioral Response: Did not attend  Intervention: I Am  Activity: Patients were given a worksheet with the letter I on it and instructed to write as many positive traits about themselves as they could inside the letter.  Education: LRT educated patients on ways they can increase their self-esteem.  Education Outcome: Patient did not attend group.   Clinical Observations/Feedback: Patient did not attend group.  Leonette Monarch, LRT/CTRS 04/07/2015 4:21 PM

## 2015-04-07 NOTE — Plan of Care (Signed)
Problem: Alteration in mood; excessive anxiety as evidenced by: Goal: STG-Pt will report an absence of self-harm thoughts/actions (Patient will report an absence of self-harm thoughts or actions)  Outcome: Not Progressing Pt expressing hopelessness and desire to end life after discharge.

## 2015-04-07 NOTE — Plan of Care (Signed)
Problem: Alteration in mood Goal: LTG-Patient reports reduction in suicidal thoughts (Patient reports reduction in suicidal thoughts and is able to verbalize a safety plan for whenever patient is feeling suicidal)  Outcome: Not Progressing Pt has passive SI and has plan to "jump in front of a truck when I leave here." Pt contracts for safety.

## 2015-04-07 NOTE — [facility] Group Notes (Signed)
[place] Group Notes:  (Nursing/MHT/Case Management/Adjunct)  Date:  04/07/2015  Time:  11:53 AM  Type of Therapy:  Psychoeducational Skills  Participation Level:  Did Not Attend   Adela Lank [facility] 04/07/2015, 11:53 AM

## 2015-04-07 NOTE — [facility] Group Notes (Signed)
[place] LCSW Group Therapy  04/07/2015 4:32 PM  Type of Therapy:  Group Therapy  Participation Level:  Did Not Attend  Modes of Intervention:  Discussion, Education, Socialization and Support  Summary of Progress/Problems: Emotional Regulation: Patients will identify both negative and positive emotions. They will discuss emotions they have difficulty regulating and how they impact their lives. Patients will be asked to identify healthy coping skills to combat unhealthy reactions to negative emotions.     [place] MSW, [place]  04/07/2015, 4:32 PM

## 2015-04-07 NOTE — Progress Notes (Signed)
D: Observed pt lying in bed. Patient alert and oriented x4. Pt endorses passive SI. Pt wants to jump in front of a truck when discharged.. Pt verbally contracted for safety. Patient denies HI/AVH. Pt disheveled. Pt affect blunted and mood is depressed and hopeless. Pt continues to be preoccupied with undiagnosed head injury, lack of sleep and other somatic delusions. Pt stated "My bones are getting smaller and I'm shrinking." Pt calm and cooperative. Pt asked for PRN medication for sleep. Pt not attending group stating "I can't participate." Pt c/o of toothache pain. A: Provided active listening and support. Administered scheduled medications. Gave Restoril PRN for sleep and tylenol PRN for pain.. Provided therapeutic communication. Encouraged pt to attend groups, even if he can't participate fully. R: Pt continues not to attend groups. Pt remains calm and cooperative. Will continue Q15 min. checks.

## 2015-04-07 NOTE — Progress Notes (Signed)
D: Patient isolative to his room. Repeats that he doesn't understand what has happened to him and why he is like this. "I'm not myself." Patient disheveled and malodorous. Given clean scrubs, towels, and toiletries and encouraged to shower. Patient states that he does not have the energy to do this. A: 15 minute checks for safety. Meds given. R: Not engaged in treatment.

## 2015-04-07 NOTE — Progress Notes (Signed)
INITIAL NUTRITION ASSESSMENT   INTERVENTION:  Meals and Snacks: encourage menu completion to best meet pt preferences  Coordination of care: No further nutrition intervention warranted at this time.  Please re-consult RD if more aggressive nutrition therapy is needed.  NUTRITION DIAGNOSIS:  No nutrition diagnosis at this time   GOAL:  Patient will meet greater than or equal to 90% of their needs  MONITOR:  Energy Intake, Anthropometrics   REASON FOR ASSESSMENT:  LOS  ASSESSMENT:  Jeremiah [name] is a 49 y.o. male with Major depressive disorder, recurrent episode, severe, with psychosis ([place])  Past Medical History  Diagnosis Date  . Hypertension   . Rectal fistula   . Dysuria   . Diverticulosis   . Gastroparesis   . Borderline personality disorder   . Major depression ([place])   . Schizophrenia ([place])   . Hypokalemia   . Microscopic hematuria   . External hemorrhoid   . Over weight   . Nicotine addiction   . Difficulty urinating   . Stroke ([place])   . Benign essential HTN 08/27/2014  . BP (high blood pressure) 01/16/2015  . Anxiety     Diet Order: regular  Current Nutrition: eating 100% of meals   Medications: reviewed    Glucose and Electrolyte/Renal profile:  reviewed  Digestive System: WDL  Anthropometrics:   Body mass index is 23.17 kg/(m^2).  Filed Weights   03/30/15 1020  Weight: 148 lb (67.132 kg)     Natasha Burda B. Zenia Resides, [place], [place] (pager)

## 2015-04-07 NOTE — Progress Notes (Signed)
[facility] MD Progress Note  04/07/2015 1:42 PM Jeremiah [name]  MRN:  [id number]  Subjective: Jeremiah [name] is still very depressed and passively suicidal. He is still not getting out of bed. He did not shower or shave and blames it on the low energy. He still complains of headaches and insomnia even though he admits that he slept some last night, he woke up at 4:30. He is already worried about his sleep in the community once discharged. He does not have specific physical complaints other than headache today he does not complain of constipation. He accepts and tolerates medications well. He was given Abilify injection yesterday. This was accompanied by testosterone injection as the patient reports much improvement in his depressive symptoms when on testosterone injections in the past. Much time was spent to encourage better self-care and group participation as well as discussion about the source of his symptoms. The patient is convinced that all his problems stem from her head injury. It appears that he has still severe depression with psychotic features that is incapacitating. Head injury was never documented. If the patient continues to do poorly we will ask Jeremiah [name] for ECT consult. This could not be given in the hospital given his lack of support. But he is referred to [facility] where it could be possible.  Principal Problem: Major depressive disorder, recurrent episode, severe, with psychosis ([place]) Diagnosis:   Patient Active Problem List   Diagnosis Date Noted  . Major depressive disorder, recurrent episode, severe, with psychosis ([place]) [F33.3] 03/30/2015  . Homelessness [Z59.0] 03/29/2015  . Chronic constipation [K59.00] 03/29/2015  . 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 Cervical pain [M54.2] 01/18/2015  . Suicide threat or attempt [F48.9] 12/14/2014  . Cognitive and behavioral changes [R41.89, F91.9] 11/22/2014  . Microscopic hematuria [R31.29] 11/22/2014  . Disorder of  ejaculation [N53.19] 11/22/2014  . 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 H/O autism spectrum disorder [Z86.59] 10/20/2014  . MDD (major depressive disorder), recurrent episode, with atypical features ([place]) [F33.9] 10/19/2014  . GAD (generalized anxiety disorder) [F41.1] 10/19/2014  . Tooth ache [K08.89] 10/19/2014  . Suicidal ideation [R45.851]   . Benign essential HTN [I10] 08/27/2014  . Affective bipolar disorder ([place]) [F31.9] 12/05/2013  . Decreased potassium in the blood [E87.6] 12/05/2013  . 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 Prostatitis [N41.9] 12/05/2013  . 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 Schizophrenia ([place]) [F20.9] 12/05/2013   Total Time spent with patient: 30 minutes  Past Psychiatric History: Depression and psychosis.  Past Medical History:  Past Medical History  Diagnosis Date  . Hypertension   . Rectal fistula   . Dysuria   . Diverticulosis   . Gastroparesis   . Borderline personality disorder   . Major depression ([place])   . Schizophrenia ([place])   . Hypokalemia   . Microscopic hematuria   . External hemorrhoid   . Over weight   . Nicotine addiction   . Difficulty urinating   . Stroke ([place])   . Benign essential HTN 08/27/2014  . BP (high blood pressure) 01/16/2015  . Anxiety     Past Surgical History  Procedure Laterality Date  . Lumbar spine surgery    . Colonoscopy    . Colonoscopy with propofol N/A 10/12/2014    Procedure: COLONOSCOPY WITH PROPOFOL;  Surgeon: Hulen Luster, MD;  [facility]: [facility] ENDOSCOPY;  Service: Gastroenterology;  Laterality: N/A;  . Tonsillectomy    .  Back surgery     Family History:  Family History  Problem Relation Age of Onset  . Stroke Mother   . Stroke Maternal Grandmother   . Diabetes Mellitus II Sister    Family Psychiatric  History: None reported.  Social History:  History  Alcohol Use No    Comment: last  drink was in march 2016     History  Drug Use No    Social History   Social History  . Marital Status: Single    Spouse Name: N/A  . Number of Children: N/A  . Years of Education: N/A   Social History Main Topics  . Smoking status: Former Smoker    Types: Cigarettes    Quit date: 08/19/2014  . Smokeless tobacco: None  . Alcohol Use: No     Comment: last drink was in march 2016  . Drug Use: No  . Sexual Activity: Not Asked   Other Topics Concern  . None   Social History Narrative   Additional Social History:                         Sleep: Poor  Appetite:  Fair  Current Medications: Current Facility-Administered Medications  Medication Dose Route Frequency Provider Last Rate Last Dose  . acetaminophen (TYLENOL) tablet 650 mg  650 mg Oral Q6H PRN Gonzella Lex, MD   650 mg at 04/06/15 2133  . alum & mag hydroxide-simeth (MAALOX/MYLANTA) 200-200-20 MG/5ML suspension 30 mL  30 mL Oral Q4H PRN Gonzella Lex, MD      . amLODipine (NORVASC) tablet 5 mg  5 mg Oral Daily Jarek Longton B Abrahm Mancia, MD   5 mg at 04/07/15 0944  . ARIPiprazole (ABILIFY) tablet 20 mg  20 mg Oral QHS Clovis Fredrickson, MD   20 mg at 04/06/15 2126  . ARIPiprazole SUSR 400 mg  400 mg Intramuscular Q28 days Clovis Fredrickson, MD   400 mg at 04/06/15 1429  . diphenhydrAMINE (BENADRYL) capsule 50 mg  50 mg Oral QHS Gonzella Lex, MD   50 mg at 04/06/15 2127  . docusate sodium (COLACE) capsule 200 mg  200 mg Oral BID Clovis Fredrickson, MD   200 mg at 04/07/15 0944  . FLUoxetine (PROZAC) capsule 60 mg  60 mg Oral Daily Genise Strack B Joyel Chenette, MD   60 mg at 04/07/15 0944  . ibuprofen (ADVIL,MOTRIN) tablet 600 mg  600 mg Oral Q6H PRN Gonzella Lex, MD   600 mg at 04/04/15 1444  . magnesium hydroxide (MILK OF MAGNESIA) suspension 30 mL  30 mL Oral Daily PRN Gonzella Lex, MD      . metoprolol succinate (TOPROL-XL) 24 hr tablet 100 mg  100 mg Oral Daily Oniel Meleski B Luwanna Brossman, MD   100 mg at  04/06/15 0924  . polyethylene glycol (MIRALAX / GLYCOLAX) packet 17 g  17 g Oral Daily Matson Welch B Gerrald Basu, MD   17 g at 04/07/15 0944  . temazepam (RESTORIL) capsule 30 mg  30 mg Oral QHS PRN Clovis Fredrickson, MD   30 mg at 04/06/15 2126  . testosterone enanthate (DELATESTRYL) injection 200 mg  200 mg Intramuscular Q28 days Clovis Fredrickson, MD   200 mg at 04/06/15 1143    Lab Results: No results found for this or any previous visit (from the past 48 hour(s)).  Physical Findings: AIMS:  , ,  ,  ,    CIWA:    COWS:  Musculoskeletal: Strength & Muscle Tone: within normal limits Gait & Station: normal Patient leans: N/A  Psychiatric Specialty Exam: Review of Systems  Neurological: Positive for headaches.  Psychiatric/Behavioral: The patient has insomnia.   All other systems reviewed and are negative.   Blood pressure 103/69, pulse 58, temperature 98.4 F (36.9 C), temperature source Oral, resp. rate 20, height 5\' 7"  (1.702 m), weight 67.132 kg (148 lb), SpO2 100 %.Body mass index is 23.17 kg/(m^2).  General Appearance: Disheveled  Eye Contact::  Minimal  Speech:  Clear and Coherent  Volume:  Decreased  Mood:  Depressed, Hopeless and Worthless  Affect:  Blunt  Thought Process:  Disorganized  Orientation:  Full (Time, Place, and Person)  Thought Content:  Delusions and Paranoid Ideation  Suicidal Thoughts:  Yes.  with intent/plan  Homicidal Thoughts:  No  Memory:  Immediate;   Fair Recent;   Fair Remote;   Fair  Judgement:  Impaired  Insight:  Lacking  Psychomotor Activity:  Decreased  Concentration:  Poor  Recall:  Poor  Fund of Knowledge:Poor  Language: Poor  Akathisia:  No  Handed:  Right  AIMS (if indicated):     Assets:  Communication Skills Desire for Improvement Physical Health Resilience  ADL's:  Intact  Cognition: WNL  Sleep:  Number of Hours: 5.75   Treatment Plan Summary: Daily contact with patient to assess and evaluate symptoms and  progress in treatment and Medication management   Mr. Alamos is a 49 year old male with a history of depression and psychosis admitting floridly psychotic and depressed in the context of medication noncompliance and severe social stressors.  1. Suicidal ideation. The patient is still suicidal. He is able to contract for safety in the hospital.  2. Mood. The patient has been treated in the past with a combination of Depakote and Zoloft. Depakote level was low on admission. He refuses to take Depakote. And believes that Prozac is better than Zoloft. Will started Prozac and Abilify for depression, psychosis, and mood stabilization. We will increase Abilify to 20 mg for paranoid delusions. Her agreed to start Abilify Maintena injections. We increased Prozac to 60 mg to address ruminations. Patient is tolerating medicine well. Supportive counseling completed.   3. Anxiety. The patient reportedly is prescribed Xanax. This was restarted in the emergency room. Given cognitive decline, I will discontinue Xanax and start Librium taper.  4. Hypertension. We restarted metoprolol, clonidine and Norvasc. Blood pressure was low again this morning we will stop clonidine.   5. Constipation. It is unclear if this is delusional. Seems like constant complaint. The patient is on bowel regimen. He reports feeling less constipated with improved appetite.  6. Insomnia. The patient claims not to be able to sleep for months. He did not sleep well with 30 mg of Restoril. We will add trazodone at night.   7. Urinary tract infection. The patient has a history of prostatitis he believes that he needs to be on antibiotic. Urinalysis is not suggestive of urinary tract infection, urine culture is negative.  8. Metabolic syndrome. Lipid panel is not elevated. TSH normal.   9. Headaches. This is a new complaint over the weekend.the patient now claims that he's had headaches since his head injury in October 2015. This is  addressed with ibuprofen.   10. ADLs. Will order occupational therapy consult.   11. Cognitive decline. Psychological consultation completed. Pplease see results.   12. Hypogonadism. We ordered testosterone injections.   13. Disposition. To be established. He is homeless  and uninsured, and without any support.   Jeremiah [name] 04/07/2015, 1:42 PM

## 2015-04-08 MED ORDER — VENLAFAXINE HCL ER 75 MG PO CP24
300.0000 mg | ORAL_CAPSULE | Freq: Every day | ORAL | Status: DC
  Administered 2015-04-09 – 2015-05-04 (×26): 300 mg via ORAL
  Filled 2015-04-08 (×26): qty 4

## 2015-04-08 MED ORDER — DOXEPIN HCL 25 MG PO CAPS
50.0000 mg | ORAL_CAPSULE | Freq: Every day | ORAL | Status: DC
  Administered 2015-04-08 – 2015-04-11 (×4): 50 mg via ORAL
  Filled 2015-04-08 (×4): qty 2

## 2015-04-08 NOTE — Plan of Care (Signed)
Problem: Ineffective individual coping Goal: STG: Patient will remain free from self harm Outcome: Progressing Medications (Doxepin ([place]) 50 mg, Docusate 200 mg, Diphenhydramine 50 mg and Restoril 30 mg PRN) administered as ordered by the physician, medications Therapeutic Effects, SEs and Adverse effects discussed, questions encouraged; 15 minute checks maintained for safety, clinical and moral support provided, patient encouraged to continue to express feelings and demonstrate safe care. Patient remain free from harm, will continue to monitor.

## 2015-04-08 NOTE — Progress Notes (Signed)
D: Observed pt lying in bed awake. Patient alert and oriented x4. Patient has passive SI but contracts for safety. Pt spent some time obsessing over various ways one could commit suicide. Pt denies HI/AVH. Pt affect is depressed and hopeless. Pt continues to express somatic delusions and states "Life as I know it is over." Pt expressed that he has been getting "longer sleep" at the hospital than at home. Pt woke up at 12:32am c/o of stuffy nose and difficulty staying asleep. Pt calm and cooperative. Pt c/o of pain flank area that he states "is always there", but pt has not mentioned in the past.  A: Offered active listening and support. Provided therapeutic communication. Redirected pt away from suicidal preoccupations. Administered scheduled medications. Gave restoril PRN for sleep @ 945pm. Gave Tylenol PRN for pain. Adjusted head of pain to assist pt's sleep.  R: Pt continues to not fully engage in treatment. Pt stated that Trazodone gives him a stuffy nose, but he forgot to mention it to staff.  Will continue Q15 min. checks.

## 2015-04-08 NOTE — Progress Notes (Addendum)
[facility] MD Progress Note  04/08/2015 1:27 PM Jeremiah [name]  MRN:  [id number]  Subjective:  Jeremiah [name] is still depressed, despondent, and suicidal. He has seen about ways to kill himself following discharge. He does not get out of bed except for meals and medications. He does not participate in programming. He still complains of constipation, insomnia, headache, poor energy and concentration. He feels he is unable to care for himself.  Principal Problem: Major depressive disorder, recurrent episode, severe, with psychosis ([place]) Diagnosis:   Patient Active Problem List   Diagnosis Date Noted  . Major depressive disorder, recurrent episode, severe, with psychosis ([place]) [F33.3] 03/30/2015  . Homelessness [Z59.0] 03/29/2015  . Chronic constipation [K59.00] 03/29/2015  . 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 Cervical pain [M54.2] 01/18/2015  . Suicide threat or attempt [F48.9] 12/14/2014  . Cognitive and behavioral changes [R41.89, F91.9] 11/22/2014  . Microscopic hematuria [R31.29] 11/22/2014  . Disorder of ejaculation [N53.19] 11/22/2014  . 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 H/O autism spectrum disorder [Z86.59] 10/20/2014  . MDD (major depressive disorder), recurrent episode, with atypical features ([place]) [F33.9] 10/19/2014  . GAD (generalized anxiety disorder) [F41.1] 10/19/2014  . Tooth ache [K08.89] 10/19/2014  . Suicidal ideation [R45.851]   . Benign essential HTN [I10] 08/27/2014  . Affective bipolar disorder ([place]) [F31.9] 12/05/2013  . Decreased potassium in the blood [E87.6] 12/05/2013  . 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 Prostatitis [N41.9] 12/05/2013  . 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 Schizophrenia ([place]) [F20.9] 12/05/2013   Total Time spent with patient: 20 minutes  Past Psychiatric History: Depression.  Past Medical History:  Past Medical  History  Diagnosis Date  . Hypertension   . Rectal fistula   . Dysuria   . Diverticulosis   . Gastroparesis   . Borderline personality disorder   . Major depression ([place])   . Schizophrenia ([place])   . Hypokalemia   . Microscopic hematuria   . External hemorrhoid   . Over weight   . Nicotine addiction   . Difficulty urinating   . Stroke ([place])   . Benign essential HTN 08/27/2014  . BP (high blood pressure) 01/16/2015  . Anxiety     Past Surgical History  Procedure Laterality Date  . Lumbar spine surgery    . Colonoscopy    . Colonoscopy with propofol N/A 10/12/2014    Procedure: COLONOSCOPY WITH PROPOFOL;  Surgeon: Hulen Luster, MD;  [facility]: [facility] ENDOSCOPY;  Service: Gastroenterology;  Laterality: N/A;  . Tonsillectomy    . Back surgery     Family History:  Family History  Problem Relation Age of Onset  . Stroke Mother   . Stroke Maternal Grandmother   . Diabetes Mellitus II Sister    Family Psychiatric  History: None reported. Social History:  History  Alcohol Use No    Comment: last drink was in march 2016     History  Drug Use No    Social History   Social History  . Marital Status: Single    Spouse Name: N/A  . Number of Children: N/A  . Years of Education: N/A   Social History Main Topics  . Smoking status: Former Smoker    Types: Cigarettes    Quit date: 08/19/2014  . Smokeless tobacco: None  . Alcohol Use: No     Comment: last drink was in march 2016  . Drug Use: No  .  Sexual Activity: Not Asked   Other Topics Concern  . None   Social History Narrative   Additional Social History:                         Sleep: Poor  Appetite:  Poor  Current Medications: Current Facility-Administered Medications  Medication Dose Route Frequency Provider Last Rate Last Dose  . acetaminophen (TYLENOL) tablet 650 mg  650 mg Oral Q6H PRN Gonzella Lex, MD   650 mg at 04/07/15 2145  . alum & mag hydroxide-simeth (MAALOX/MYLANTA) 200-200-20 MG/5ML  suspension 30 mL  30 mL Oral Q4H PRN Gonzella Lex, MD      . amLODipine (NORVASC) tablet 5 mg  5 mg Oral Daily Clovis Fredrickson, MD   5 mg at 04/08/15 0939  . ARIPiprazole (ABILIFY) tablet 20 mg  20 mg Oral QHS Clovis Fredrickson, MD   20 mg at 04/07/15 2145  . ARIPiprazole SUSR 400 mg  400 mg Intramuscular Q28 days Clovis Fredrickson, MD   400 mg at 04/06/15 1429  . diphenhydrAMINE (BENADRYL) capsule 50 mg  50 mg Oral QHS Gonzella Lex, MD   50 mg at 04/07/15 2145  . docusate sodium (COLACE) capsule 200 mg  200 mg Oral BID Clovis Fredrickson, MD   200 mg at 04/08/15 0939  . FLUoxetine (PROZAC) capsule 60 mg  60 mg Oral Daily Clovis Fredrickson, MD   60 mg at 04/08/15 0939  . ibuprofen (ADVIL,MOTRIN) tablet 600 mg  600 mg Oral Q6H PRN Gonzella Lex, MD   600 mg at 04/04/15 1444  . magnesium hydroxide (MILK OF MAGNESIA) suspension 30 mL  30 mL Oral Daily PRN Gonzella Lex, MD      . metoprolol succinate (TOPROL-XL) 24 hr tablet 100 mg  100 mg Oral Daily Ronit Cranfield B Lochlin Eppinger, MD   100 mg at 04/08/15 0939  . polyethylene glycol (MIRALAX / GLYCOLAX) packet 17 g  17 g Oral Daily Clovis Fredrickson, MD   17 g at 04/08/15 0939  . temazepam (RESTORIL) capsule 30 mg  30 mg Oral QHS PRN Clovis Fredrickson, MD   30 mg at 04/07/15 2144  . testosterone enanthate (DELATESTRYL) injection 200 mg  200 mg Intramuscular Q28 days Clovis Fredrickson, MD   200 mg at 04/06/15 1143  . traZODone (DESYREL) tablet 200 mg  200 mg Oral QHS Kempton Milne B Suetta Hoffmeister, MD   200 mg at 04/07/15 2144    Lab Results: No results found for this or any previous visit (from the past 48 hour(s)).  Physical Findings: AIMS:  , ,  ,  ,    CIWA:    COWS:     Musculoskeletal: Strength & Muscle Tone: within normal limits Gait & Station: normal Patient leans: N/A  Psychiatric Specialty Exam: Review of Systems  Constitutional: Positive for malaise/fatigue.  Gastrointestinal: Positive for constipation.   Neurological: Positive for weakness and headaches.  Psychiatric/Behavioral: Positive for suicidal ideas and memory loss. The patient is nervous/anxious and has insomnia.   All other systems reviewed and are negative.   Blood pressure 108/73, pulse 68, temperature 98.6 F (37 C), temperature source Oral, resp. rate 20, height 5\' 7"  (1.702 m), weight 67.132 kg (148 lb), SpO2 100 %.Body mass index is 23.17 kg/(m^2).  General Appearance: Disheveled  Eye Contact::  Minimal  Speech:  Slow  Volume:  Decreased  Mood:  Depressed, Hopeless and Worthless  Affect:  Blunt  Thought Process:  Irrelevant  Orientation:  Full (Time, Place, and Person)  Thought Content:  Delusions and Paranoid Ideation  Suicidal Thoughts:  Yes.  with intent/plan  Homicidal Thoughts:  No  Memory:  Immediate;   Poor Recent;   Poor Remote;   Poor  Judgement:  Impaired  Insight:  Lacking  Psychomotor Activity:  Decreased  Concentration:  Poor  Recall:  Poor  Fund of Knowledge:Poor  Language: Poor  Akathisia:  No  Handed:  Right  AIMS (if indicated):     Assets:  Communication Skills Desire for Improvement Resilience  ADL's:  Intact  Cognition: WNL  Sleep:  Number of Hours: 2   Treatment Plan Summary: Daily contact with patient to assess and evaluate symptoms and progress in treatment and Medication management   Jeremiah [name] is a 49 year old male with a history of depression and psychosis admitting floridly psychotic and depressed in the context of medication noncompliance and severe social stressors.  1. Suicidal ideation. The patient is still suicidal. He is able to contract for safety in the hospital.  2. Mood. The patient was started on Prozac for depression and anxiety and his dose was titrated to 60 mg and Abilify for psychosis. Abilify was increased to 20 mg daily. He accepted Abilify maintena injection. We will discontinue Prozac and start Effexor 300 mg daily for depression.    3. Anxiety. The patient  reportedly was prescribed Xanax in the community. Given concerns about cognitive decline, we offered Librium taper.  4. Hypertension. We restarted metoprolol, clonidine and Norvasc. Clonidine was discontinued due to low blood pressure.    5. Constipation. It is unclear if this is delusional. Seems like constant complaint. The patient is on bowel regimen. He reports feeling less constipated with improved appetite.  6. Insomnia. The patient claims not to be able to sleep for months. He did not sleep well with 30 mg of Restoril. When we added trazodone last night he slept only 2 hours complaining of stuffy nose from trazodone. We will discontinue trazodone and start doxepin.     7. Urinary tract infection. The patient has a history of prostatitis he believes that he needs to be on antibiotic. Urinalysis is not suggestive of urinary tract infection, urine culture is negative.  8. Metabolic syndrome. Lipid panel is not elevated. TSH normal.   9. Headaches. This is a new complaint over the weekend.the patient now claims that he's had headaches since his head injury in October 2015. This is addressed with ibuprofen.   10. ADLs. Occupational therapy consult was completed.  it appears that low energy and tiredness are obstacles.   11. Cognitive decline. Psychological consultation completed and did not detect gross cognitive difficulties. Please see results.   12. Hypogonadism. We ordered testosterone injections.   13. ECT. After 9 days in the hospital the patient is still severely depressed, delusional, and suicidal. He was referred to [facility] for possible ECT treatment.  14. Disposition. To be established. He is homeless and uninsured, and without any support.    Molley Houser 04/08/2015, 1:27 PM

## 2015-04-08 NOTE — BHH Group Notes (Signed)
[place] Group Notes:  (Nursing/MHT/Case Management/Adjunct)  Date:  04/08/2015  Time:  1:48 PM  Type of Therapy:  Psychoeducational Skills  Participation Level:  Did Not Attend    Drake Leach 04/08/2015, 1:48 PM

## 2015-04-08 NOTE — Tx Team (Signed)
Interdisciplinary Treatment Plan Update (Adult)  Date:  04/08/2015 Time Reviewed:  4:10 PM  Progress in Treatment: Attending groups: No. Participating in groups:  No. Taking medication as prescribed:  Yes. Tolerating medication:  Yes. Family/Significant othe contact made:  No, will contact:  if patient provides consent Patient understands diagnosis:  Yes. Discussing patient identified problems/goals with staff:  Yes. Medical problems stabilized or resolved:  Yes. Denies suicidal/homicidal ideation: Yes. Issues/concerns per patient self-inventory:  No. Other:  New problem(s) identified: No, Describe:  none reported  Discharge Plan or Barriers: Patient is currently homeless, depressed, SI, uninsured and states problems started after head injury 2 years ago. Patient will stabilize on medications and is offered housing shelter info and will need mental health followup at discharge. Patient remains on wait list for [facility] as he continues to have delusions and depression and probably is not a good candidate for ECT with head injury. Patient may need an ACT referral.   Reason for Continuation of Hospitalization: Delusions  Depression Suicidal ideation  Comments: Patient taking Invega sustenna  Estimated length of stay:up to 7 days expected discharge Thursday 04/15/15   New goal(s):  Review of initial/current patient goals per problem list:   1.  Goal(s):decrease depression   Met:  No  Target date:by discharge  2.  Goal (s):eliminate SI  Met:  No  Target date:by discharge  3.  Goal(s):delusions will be manageable  Met:  No  Target date:by discharge  Attendees: Physician:  Orson Slick, MD 11/17/20164:10 PM  Nursing:   Terressa Koyanagi, RN 11/17/20164:10 PM  Other:  Carmell Austria, [place] 11/17/20164:10 PM  Other:   11/17/20164:10 PM  Other:   11/17/20164:10 PM  Other:  11/17/20164:10 PM  Other:  11/17/20164:10 PM  Other:  11/17/20164:10 PM  Other:  11/17/20164:10  PM  Other:  11/17/20164:10 PM  Other:  11/17/20164:10 PM  Other:   11/17/20164:10 PM   Scribe for Treatment Team:   Keene Breath, MSW, [place]  04/08/2015, 4:10 PM

## 2015-04-08 NOTE — Progress Notes (Signed)
D:  Pt denies SI/AVH, endorses passive SI, no plan contracts for safety, states that he is tired today because he did not sleep well last night.   A:  Emotional support provided, Encouraged pt to continue with treatment plan and attend all group activities, q15 min checks maintained for safety, pt to talk with MD about his sleep difficulty.  R:  Pt is not going to groups and states that he does not plan on going to any groups because he "can't contribute" due to his "head injury".

## 2015-04-08 NOTE — Progress Notes (Signed)
Observed in room, A&Ox2, reported pain 6/10 HA, thoughts and contents disorganize, loose association (tangential), perseverating about the "TBI", "I have not been sleeping for a year .Marland Kitchen.." Will monitor

## 2015-04-08 NOTE — Plan of Care (Signed)
Problem: Alteration in mood; excessive anxiety as evidenced by: Goal: LTG-Patient's behavior demonstrates decreased anxiety (Patient's behavior demonstrates anxiety and he/she is utilizing learned coping skills to deal with anxiety-producing situations)  Outcome: Progressing Pt denies having anxiety this am, in bed sleeping, states that he is tired, didn't sleep well last night, pt to talk to MD concerning his sleep difficulty.

## 2015-04-08 NOTE — Progress Notes (Signed)
Recreation Therapy Notes  Date: 11.17.16 Time: 3:00 pm Location: Craft Room  Group Topic: Leisure Education  Goal Area(s) Addresses:  Patient will identify activities for each letter of the alphabet. Patient will verbalize ability to integrate positive leisure into life post d/c. Patient will verbalize ability to use leisure as a Technical sales engineer.  Behavioral Response: Did not attend  Intervention: Leisure Alphabet  Activity: Patients were given a Leisure Air traffic controller and instructed to write healthy leisure activities for each letter of the alphabet.  Education: LRT educated patients on what is needed to participate in leisure.  Education Outcome: Patient did not attend group.   Clinical Observations/Feedback: Patient did not attend group.  Leonette Monarch, LRT/CTRS 04/08/2015 4:11 PM

## 2015-04-09 NOTE — BHH Group Notes (Signed)
[facility] LCSW Aftercare Discharge Planning Group Note   04/09/2015 4:46 PM  Participation Quality:  Did not attend.   [place] MSW, LCSWA

## 2015-04-09 NOTE — Progress Notes (Signed)
Occupational Therapy Discharge Patient Details Name: Jeremiah [name] MRN: [id number] DOB: [birth year] Today's Date: 04/09/2015 Time:  -     Patient discharged from OT services secondary to goals met and no further OT needs identified.  Please see latest therapy progress note for current level of functioning and progress toward goals.    Progress and discharge plan discussed with patient and/or caregiver: Patient/Caregiver agrees with plan.  Also discussed plan and rec with Gigi from [place]. Pt seen for assessment of completing ADLs using fine motor skills and educated in use of button hook and demonstrated teach back using his shirts in his room.  Pt is able to complete buttons, holding a comb, using a toothbrush and put his reading glasses on with extra time.  He is able to use button hook independently as well if he chooses to utilize one and purchase after discharge.  Also given an adaptive equipment catalog in case other issues arise.  NSG updated. All OT goals met and pt to be discharged from OT.  Please re-consult for any other needs.       [place], OTR/L ascom [phone] 04/09/2015, 10:44 AM

## 2015-04-09 NOTE — Plan of Care (Signed)
Problem: Alteration in mood Goal: LTG-Patient reports reduction in suicidal thoughts (Patient reports reduction in suicidal thoughts and is able to verbalize a safety plan for whenever patient is feeling suicidal)  Outcome: Not Progressing Verbalized passive suicidal ideation.

## 2015-04-09 NOTE — Progress Notes (Signed)
Recreation Therapy Notes  Date: 11.18.16 Time: 3:00 pm Location: Craft Room  Group Topic: Coping Skills  Goal Area(s) Addresses:  Patient will participate in coping skill.  Patient will verbalize benefit of using art as a coping skill.  Behavioral Response: Did not attend  Intervention: Coloring  Activity: Patients were given coloring sheets and instructed to color and think about what emotions they were feeling and what they were focused on.  Education: LRT educated patients on benefit of art as a Technical sales engineer.  Education Outcome: Patient did not attend group.  Clinical Observations/Feedback: Patient did not attend group.  Leonette Monarch, LRT/CTRS 04/09/2015 4:50 PM

## 2015-04-09 NOTE — Progress Notes (Signed)
Patient stayed in bed most of the shift.Verbalized depression & passive suicidal ideation.He stick on his head injury,feels hopelessness.Refused bath & to go to groups.Compliant with medications.

## 2015-04-09 NOTE — Progress Notes (Signed)
[facility] MD Progress Note  04/09/2015 12:59 PM Jeremiah [name]  MRN:  [id number]  Subjective:  Jeremiah [name] is still very depressed, despondent, frustrated, and contemplating suicide following discharge. He is able to contract for safety in the hospital. He stays in bed all day long complaining of low energy. Apparently there are no physical problems to account for his low energy and he is occupational therapy consult indicates that his symptoms are mostly due to depression. He is very poorly groomed, he refuses to shower or even call his hear. He does not participate in programming. She seems to tolerate medications well. He slept better last night. He no longer has constipation. His headaches are addressed with the Tylenol.  Principal Problem: Major depressive disorder, recurrent episode, severe, with psychosis ([place]) Diagnosis:   Patient Active Problem List   Diagnosis Date Noted  . Major depressive disorder, recurrent episode, severe, with psychosis ([place]) [F33.3] 03/30/2015  . Homelessness [Z59.0] 03/29/2015  . Chronic constipation [K59.00] 03/29/2015  . 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 Cervical pain [M54.2] 01/18/2015  . Suicide threat or attempt [F48.9] 12/14/2014  . Cognitive and behavioral changes [R41.89, F91.9] 11/22/2014  . Microscopic hematuria [R31.29] 11/22/2014  . Disorder of ejaculation [N53.19] 11/22/2014  . 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 H/O autism spectrum disorder [Z86.59] 10/20/2014  . MDD (major depressive disorder), recurrent episode, with atypical features ([place]) [F33.9] 10/19/2014  . GAD (generalized anxiety disorder) [F41.1] 10/19/2014  . Tooth ache [K08.89] 10/19/2014  . Suicidal ideation [R45.851]   . Benign essential HTN [I10] 08/27/2014  . Affective bipolar disorder ([place]) [F31.9] 12/05/2013  . Decreased potassium in the blood [E87.6] 12/05/2013  .  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 Prostatitis [N41.9] 12/05/2013  . 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 Schizophrenia ([place]) [F20.9] 12/05/2013   Total Time spent with patient: 20 minutes  Past Psychiatric History: Depression.  Past Medical History:  Past Medical History  Diagnosis Date  . Hypertension   . Rectal fistula   . Dysuria   . Diverticulosis   . Gastroparesis   . Borderline personality disorder   . Major depression ([place])   . Schizophrenia ([place])   . Hypokalemia   . Microscopic hematuria   . External hemorrhoid   . Over weight   . Nicotine addiction   . Difficulty urinating   . Stroke ([place])   . Benign essential HTN 08/27/2014  . BP (high blood pressure) 01/16/2015  . Anxiety     Past Surgical History  Procedure Laterality Date  . Lumbar spine surgery    . Colonoscopy    . Colonoscopy with propofol N/A 10/12/2014    Procedure: COLONOSCOPY WITH PROPOFOL;  Surgeon: Hulen Luster, MD;  Location: [facility] ENDOSCOPY;  Service: Gastroenterology;  Laterality: N/A;  . Tonsillectomy    . Back surgery     Family History:  Family History  Problem Relation Age of Onset  . Stroke Mother   . Stroke Maternal Grandmother   . Diabetes Mellitus II Sister    Family Psychiatric  History: None reported. Social History:  History  Alcohol Use No    Comment: last drink was in march 2016     History  Drug Use No    Social History   Social History  . Marital Status: Single    Spouse Name: N/A  . Number of Children: N/A  . Years of Education: N/A   Social  History Main Topics  . Smoking status: Former Smoker    Types: Cigarettes    Quit date: 08/19/2014  . Smokeless tobacco: None  . Alcohol Use: No     Comment: last drink was in march 2016  . Drug Use: No  . Sexual Activity: Not Asked   Other Topics Concern  . None   Social History Narrative   Additional Social History:                         Sleep: Poor  Appetite:  Poor  Current  Medications: Current Facility-Administered Medications  Medication Dose Route Frequency Provider Last Rate Last Dose  . acetaminophen (TYLENOL) tablet 650 mg  650 mg Oral Q6H PRN Gonzella Lex, MD   650 mg at 04/07/15 2145  . alum & mag hydroxide-simeth (MAALOX/MYLANTA) 200-200-20 MG/5ML suspension 30 mL  30 mL Oral Q4H PRN Gonzella Lex, MD      . amLODipine (NORVASC) tablet 5 mg  5 mg Oral Daily Clovis Fredrickson, MD   5 mg at 04/09/15 0903  . ARIPiprazole (ABILIFY) tablet 20 mg  20 mg Oral QHS Clovis Fredrickson, MD   20 mg at 04/08/15 2124  . ARIPiprazole SUSR 400 mg  400 mg Intramuscular Q28 days Clovis Fredrickson, MD   400 mg at 04/06/15 1429  . diphenhydrAMINE (BENADRYL) capsule 50 mg  50 mg Oral QHS Gonzella Lex, MD   50 mg at 04/08/15 2124  . docusate sodium (COLACE) capsule 200 mg  200 mg Oral BID Clovis Fredrickson, MD   200 mg at 04/09/15 0903  . doxepin (SINEQUAN) capsule 50 mg  50 mg Oral QHS Clovis Fredrickson, MD   50 mg at 04/08/15 2124  . ibuprofen (ADVIL,MOTRIN) tablet 600 mg  600 mg Oral Q6H PRN Gonzella Lex, MD   600 mg at 04/04/15 1444  . magnesium hydroxide (MILK OF MAGNESIA) suspension 30 mL  30 mL Oral Daily PRN Gonzella Lex, MD      . metoprolol succinate (TOPROL-XL) 24 hr tablet 100 mg  100 mg Oral Daily Mallika Sanmiguel B Mardella Nuckles, MD   100 mg at 04/09/15 0903  . polyethylene glycol (MIRALAX / GLYCOLAX) packet 17 g  17 g Oral Daily Clovis Fredrickson, MD   17 g at 04/09/15 0903  . temazepam (RESTORIL) capsule 30 mg  30 mg Oral QHS PRN Clovis Fredrickson, MD   30 mg at 04/08/15 2124  . testosterone enanthate (DELATESTRYL) injection 200 mg  200 mg Intramuscular Q28 days Clovis Fredrickson, MD   200 mg at 04/06/15 1143  . venlafaxine XR (EFFEXOR-XR) 24 hr capsule 300 mg  300 mg Oral Q breakfast Kaveri Perras B Ziair Penson, MD   300 mg at 04/09/15 0855    Lab Results: No results found for this or any previous visit (from the past 48 hour(s)).  Physical  Findings: AIMS:  , ,  ,  ,    CIWA:    COWS:     Musculoskeletal: Strength & Muscle Tone: within normal limits Gait & Station: normal Patient leans: N/A  Psychiatric Specialty Exam: Review of Systems  Constitutional: Positive for malaise/fatigue.  Neurological: Positive for weakness and headaches.  Psychiatric/Behavioral: Positive for depression. The patient has insomnia.   All other systems reviewed and are negative.   Blood pressure 104/68, pulse 69, temperature 98.7 F (37.1 C), temperature source Oral, resp. rate 20, height 5\' 7"  (1.702 m),  weight 67.132 kg (148 lb), SpO2 100 %.Body mass index is 23.17 kg/(m^2).  General Appearance: Disheveled  Eye Contact::  Minimal  Speech:  Slow  Volume:  Decreased  Mood:  Depressed, Hopeless and Worthless  Affect:  Flat  Thought Process:  Disorganized  Orientation:  Full (Time, Place, and Person)  Thought Content:  Delusions and Paranoid Ideation  Suicidal Thoughts:  Yes.  with intent/plan  Homicidal Thoughts:  No  Memory:  Immediate;   Poor Recent;   Poor Remote;   Poor  Judgement:  Poor  Insight:  Lacking  Psychomotor Activity:  Decreased  Concentration:  Poor  Recall:  Poor  Fund of Knowledge:Poor  Language: Poor  Akathisia:  No  Handed:  Right  AIMS (if indicated):     Assets:  Communication Skills Desire for Improvement Physical Health  ADL's:  Intact  Cognition: WNL  Sleep:  Number of Hours: 6.45   Treatment Plan Summary: Daily contact with patient to assess and evaluate symptoms and progress in treatment and Medication management   Jeremiah [name] is a 49 year old male with a history of depression and psychosis admitting floridly psychotic and depressed in the context of medication noncompliance and severe social stressors.  1. Suicidal ideation. The patient is still suicidal. He is able to contract for safety in the hospital.  2. Mood. The patient was started on Prozac for depression and anxiety and his dose was  titrated to 60 mg and Abilify for psychosis. Abilify was increased to 20 mg daily. He accepted Abilify maintena injection. We will discontinue Prozac and start Effexor 300 mg daily for depression.   3. Anxiety. The patient reportedly was prescribed Xanax in the community. Given concerns about cognitive decline, we offered Librium taper.  4. Hypertension. We restarted metoprolol, clonidine and Norvasc. Clonidine was discontinued due to low blood pressure.   5. Constipation. It is unclear if this is delusional. Seems like constant complaint. The patient is on bowel regimen. He reports feeling less constipated with improved appetite.  6. Insomnia. The patient claims not to be able to sleep for months. He did not sleep well with 30 mg of Restoril. When we added trazodone last night he slept only 2 hours complaining of stuffy nose from trazodone. We will discontinue trazodone and start doxepin.    7. Urinary tract infection. The patient has a history of prostatitis he believes that he needs to be on antibiotic. Urinalysis is not suggestive of urinary tract infection, urine culture is negative.  8. Metabolic syndrome. Lipid panel is not elevated. TSH normal.   9. Headaches. This is a new complaint over the weekend.the patient now claims that he's had headaches since his head injury in October 2015. This is addressed with ibuprofen.   10. ADLs. Occupational therapy consult was completed. it appears that low energy and tiredness are obstacles.   11. Cognitive decline. Psychological consultation completed and did not detect gross cognitive difficulties. Please see results.   12. Hypogonadism. We ordered testosterone injections.   13. ECT. After 9 days in the hospital the patient is still severely depressed, delusional, and suicidal. He was referred to [facility] for possible ECT treatment.  14. Disposition. To be established. He is homeless and uninsured, and without any  support.   No medication adjustments were offered today. 04/09/2015.  Jeremiah [name] 04/09/2015, 12:59 PM

## 2015-04-09 NOTE — [facility] Group Notes (Signed)
[facility] Group Notes:  (Nursing/MHT/Case Management/Adjunct)  Date:  04/09/2015  Time:  1:36 PM  Type of Therapy:  Psychoeducational Skills  Participation Level:  Did Not Attend   Adela Lank [facility] 04/09/2015, 1:36 PM

## 2015-04-09 NOTE — Evaluation (Addendum)
Occupational Therapy Evaluation Patient Details Name: Jeremiah [name] MRN: [id number] DOB: 11/29/1965 Today's Date: 04/09/2015    History of Present Illness Jeremiah [name] is a 49 y.o. male who is presenting with difficulty sleeping and NSG indicated he was reporting not being able to complete buttons.  He does not confirm or deny suicidal ideation. He is oriented to person, place and time and knows his board in his room has incorrect date.    Clinical Impression   Pt seen after discussing status with NSG who indicated he was having difficulty with buttons.  Assessed ADLs sitting EOB and pt was educated in use of button hook but was able to complete buttons with or without button hook if given extra time to complete.  He has full active ROM of BUE and hands to complete fine motor tasks for ADLs but is slow due to decreased energy level and lethargy most likely from medications to help him sleep and for current treatment diagnosis.  Pt given adaptive equipment catalog in case he has further needs to aid in ADLs. All goals met and pt discharged from OT.  Please re-consult if any further ADL needs arise.    Follow Up Recommendations  No OT follow up    Equipment Recommendations       Recommendations for Other Services       Precautions / Restrictions        Mobility Bed Mobility                  Transfers                      Balance                                            ADL Overall ADL's : Modified independent                                       General ADL Comments: Pt seen to re-assess ADLs sitting at EOB.  NSG indicated he could not complete buttons.  He requires extra time to complete task and was able to use R with L hand with and without use of button hook.  He was educated in and given Health and safety inspector.  Pt is able to complete socks and pants over feet  sitting EOB.  He is able to hold onto a comb but  declined combing his hair stating he had too many tangles and it would hurt.  Also able to hold and use toothibrush.  He appears to have adequate strength and gross and fine motor skills to complete ADLs at this time. Encourage pt to get up and participate in activities during the day to help with sleep pattern at night but he states that the more he does during the day the worse he sleeps.      Vision     Perception     Praxis      Pertinent Vitals/Pain       Hand Dominance     Extremity/Trunk Assessment             Communication     Cognition  General Comments       Exercises       Shoulder Instructions      Home Living                                          Prior Functioning/Environment               OT Diagnosis:     OT Problem List:     OT Treatment/Interventions:      OT Goals(Current goals can be found in the care plan section) ADL Goals Pt Will Perform Grooming: Independently;sitting Pt Will Perform Upper Body Dressing: Independently;with adaptive equipment;sitting Pt Will Perform Lower Body Dressing: Independently;with adaptive equipment  OT Frequency:     Barriers to D/C:            Co-evaluation              End of Session Equipment Utilized During Treatment:  (button hook and adaptive equipment catalog) Nurse Communication:  (updated about ADLs and education about button hook if needed in future and adaptive equip catalog)  Activity Tolerance: Patient tolerated treatment well Patient left: in bed   Time: 6389-3734 OT Time Calculation (min): 29 min Charges:  OT General Charges $OT Visit: 1 Procedure OT Treatments $Self Care/Home Management : 23-37 mins G-Codes:    Jalien Weakland May 03, 2015, 12:25 PM    Chrys Racer, OTR/L ascom [phone]  Pt was seen for treatment session not evaluation as the note header indicates.  Initial eval completed on 04/02/15 by  Valente David. MS, OTR/L.

## 2015-04-10 NOTE — Progress Notes (Signed)
Patient with depressed affect, withdrawn behavior. Sleepy in bed with fair adls. No SI/HI/AVH at this time. Minimal interaction with peers, appropriate when staff initiates interaction. Shower and therapy groups encouraged. Safety maintained.

## 2015-04-10 NOTE — Progress Notes (Signed)
Patient calm and cooperative. Complained of pain in joints. Acetaminophen given. Pain level went down to 0. Patient has been pleasant. Denies SI/HI/AVH currently. Has been pleasant. No delusions observed.

## 2015-04-10 NOTE — [facility] Group Notes (Signed)
[employer] LCSW Group Therapy  04/10/2015 2:28 PM  Type of Therapy:  Group Therapy  Participation Level:  Did Not Attend  Modes of Intervention:  Discussion, Education, Socialization and Support  Summary of Progress/Problems: Pt will identify unhealthy thoughts and how they impact their emotions and behavior. Pt will be encouraged to discuss these thoughts, emotions and behaviors with the group.   [employer] MSW, [place]  04/10/2015, 2:28 PM

## 2015-04-10 NOTE — Progress Notes (Signed)
[facility] MD Progress Note  04/10/2015 10:48 AM Jeremiah [name]  MRN:  [id number]  Subjective:  There is absolutely no improvement. Jeremiah [name] is still depressed, delusional, secluded to his room, unshaven and stinky. He does not participate in programming and stays in his room. He again complains of constipation for months but admits to having bowel movement yesterday.  Principal Problem: Major depressive disorder, recurrent episode, severe, with psychosis ([place]) Diagnosis:   Patient Active Problem List   Diagnosis Date Noted  . Major depressive disorder, recurrent episode, severe, with psychosis ([place]) [F33.3] 03/30/2015  . Homelessness [Z59.0] 03/29/2015  . Chronic constipation [K59.00] 03/29/2015  . 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 Cervical pain [M54.2] 01/18/2015  . Suicide threat or attempt [F48.9] 12/14/2014  . Cognitive and behavioral changes [R41.89, F91.9] 11/22/2014  . Microscopic hematuria [R31.29] 11/22/2014  . Disorder of ejaculation [N53.19] 11/22/2014  . 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 H/O autism spectrum disorder [Z86.59] 10/20/2014  . MDD (major depressive disorder), recurrent episode, with atypical features ([place]) [F33.9] 10/19/2014  . GAD (generalized anxiety disorder) [F41.1] 10/19/2014  . Tooth ache [K08.89] 10/19/2014  . Suicidal ideation [R45.851]   . Benign essential HTN [I10] 08/27/2014  . Affective bipolar disorder ([place]) [F31.9] 12/05/2013  . Decreased potassium in the blood [E87.6] 12/05/2013  . 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 Prostatitis [N41.9] 12/05/2013  . 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 Schizophrenia ([place]) [F20.9] 12/05/2013   Total Time spent with patient: 20 minutes  Past Psychiatric History: depression.  Past Medical History:  Past Medical History  Diagnosis Date  . Hypertension   . Rectal fistula   . Dysuria    . Diverticulosis   . Gastroparesis   . Borderline personality disorder   . Major depression ([place])   . Schizophrenia ([place])   . Hypokalemia   . Microscopic hematuria   . External hemorrhoid   . Over weight   . Nicotine addiction   . Difficulty urinating   . Stroke ([place])   . Benign essential HTN 08/27/2014  . BP (high blood pressure) 01/16/2015  . Anxiety     Past Surgical History  Procedure Laterality Date  . Lumbar spine surgery    . Colonoscopy    . Colonoscopy with propofol N/A 10/12/2014    Procedure: COLONOSCOPY WITH PROPOFOL;  Surgeon: Hulen Luster, MD;  Location: [facility] ENDOSCOPY;  Service: Gastroenterology;  Laterality: N/A;  . Tonsillectomy    . Back surgery     Family History:  Family History  Problem Relation Age of Onset  . Stroke Mother   . Stroke Maternal Grandmother   . Diabetes Mellitus II Sister    Family Psychiatric  History: none reported. Social History:  History  Alcohol Use No    Comment: last drink was in march 2016     History  Drug Use No    Social History   Social History  . Marital Status: Single    Spouse Name: N/A  . Number of Children: N/A  . Years of Education: N/A   Social History Main Topics  . Smoking status: Former Smoker    Types: Cigarettes    Quit date: 08/19/2014  . Smokeless tobacco: None  . Alcohol Use: No     Comment: last drink was in march 2016  . Drug Use: No  . Sexual Activity: Not Asked   Other Topics Concern  . None  Social History Narrative   Additional Social History:                         Sleep: Good  Appetite:  Fair  Current Medications: Current Facility-Administered Medications  Medication Dose Route Frequency Provider Last Rate Last Dose  . acetaminophen (TYLENOL) tablet 650 mg  650 mg Oral Q6H PRN Gonzella Lex, MD   650 mg at 04/09/15 2114  . alum & mag hydroxide-simeth (MAALOX/MYLANTA) 200-200-20 MG/5ML suspension 30 mL  30 mL Oral Q4H PRN Gonzella Lex, MD      . amLODipine  (NORVASC) tablet 5 mg  5 mg Oral Daily Itza Maniaci B Jomarie Gellis, MD   5 mg at 04/10/15 0914  . ARIPiprazole (ABILIFY) tablet 20 mg  20 mg Oral QHS Clovis Fredrickson, MD   20 mg at 04/09/15 2115  . ARIPiprazole SUSR 400 mg  400 mg Intramuscular Q28 days Clovis Fredrickson, MD   400 mg at 04/06/15 1429  . diphenhydrAMINE (BENADRYL) capsule 50 mg  50 mg Oral QHS Gonzella Lex, MD   50 mg at 04/09/15 2114  . docusate sodium (COLACE) capsule 200 mg  200 mg Oral BID Clovis Fredrickson, MD   200 mg at 04/10/15 0914  . doxepin (SINEQUAN) capsule 50 mg  50 mg Oral QHS Mayrene Bastarache B Pearline Yerby, MD   50 mg at 04/09/15 2115  . ibuprofen (ADVIL,MOTRIN) tablet 600 mg  600 mg Oral Q6H PRN Gonzella Lex, MD   600 mg at 04/04/15 1444  . magnesium hydroxide (MILK OF MAGNESIA) suspension 30 mL  30 mL Oral Daily PRN Gonzella Lex, MD      . metoprolol succinate (TOPROL-XL) 24 hr tablet 100 mg  100 mg Oral Daily Krisa Blattner B Giara Mcgaughey, MD   100 mg at 04/10/15 0913  . polyethylene glycol (MIRALAX / GLYCOLAX) packet 17 g  17 g Oral Daily Clovis Fredrickson, MD   17 g at 04/10/15 0914  . temazepam (RESTORIL) capsule 30 mg  30 mg Oral QHS PRN Clovis Fredrickson, MD   30 mg at 04/08/15 2124  . testosterone enanthate (DELATESTRYL) injection 200 mg  200 mg Intramuscular Q28 days Clovis Fredrickson, MD   200 mg at 04/06/15 1143  . venlafaxine XR (EFFEXOR-XR) 24 hr capsule 300 mg  300 mg Oral Q breakfast Bart Ashford B Ronnae Kaser, MD   300 mg at 04/10/15 [id number]    Lab Results: No results found for this or any previous visit (from the past 48 hour(s)).  Physical Findings: AIMS:  , ,  ,  ,    CIWA:    COWS:     Musculoskeletal: Strength & Muscle Tone: within normal limits Gait & Station: normal Patient leans: N/A  Psychiatric Specialty Exam: Review of Systems  Gastrointestinal: Positive for constipation.  Neurological: Positive for headaches.  Psychiatric/Behavioral: Positive for depression.  All other systems  reviewed and are negative.   Blood pressure 96/70, pulse 60, temperature 98.7 F (37.1 C), temperature source Oral, resp. rate 20, height 5\' 7"  (1.702 m), weight 67.132 kg (148 lb), SpO2 100 %.Body mass index is 23.17 kg/(m^2).  General Appearance: Disheveled  Eye Contact::  Minimal  Speech:  Slow  Volume:  Decreased  Mood:  Depressed, Hopeless and Worthless  Affect:  Flat  Thought Process:  Disorganized  Orientation:  Full (Time, Place, and Person)  Thought Content:  Delusions and Paranoid Ideation  Suicidal Thoughts:  Yes.  with  intent/plan  Homicidal Thoughts:  No  Memory:  Immediate;   Poor Recent;   Poor Remote;   Poor  Judgement:  Poor  Insight:  Lacking  Psychomotor Activity:  Decreased  Concentration:  Poor  Recall:  Poor  Fund of Knowledge:Poor  Language: Poor  Akathisia:  No  Handed:  Right  AIMS (if indicated):     Assets:  Communication Skills Desire for Improvement Physical Health Resilience  ADL's:  Intact  Cognition: WNL  Sleep:  Number of Hours: 6.45   Treatment Plan Summary: Daily contact with patient to assess and evaluate symptoms and progress in treatment and Medication management   Jeremiah [name] is a 49 year old male with a history of depression and psychosis admitting floridly psychotic and depressed in the context of medication noncompliance and severe social stressors.  1. Suicidal ideation. The patient is still suicidal. He is able to contract for safety in the hospital.  2. Mood. The patient was started on Prozac for depression and anxiety and his dose was titrated to 60 mg and Abilify for psychosis. Abilify was increased to 20 mg daily. He accepted Abilify maintena injection. We will discontinue Prozac and start Effexor 300 mg daily for depression.   3. Anxiety. The patient reportedly was prescribed Xanax in the community. Given concerns about cognitive decline, we offered Librium taper.  4. Hypertension. We restarted metoprolol, clonidine and  Norvasc. Clonidine was discontinued due to low blood pressure.   5. Constipation. It is unclear if this is delusional. Seems like constant complaint. The patient is on bowel regimen. He reports feeling less constipated with improved appetite.  6. Insomnia. The patient claims not to be able to sleep for months. He did not sleep well with 30 mg of Restoril. When we added trazodone last night he slept only 2 hours complaining of stuffy nose from trazodone. We will discontinue trazodone and start doxepin.    7. Urinary tract infection. The patient has a history of prostatitis he believes that he needs to be on antibiotic. Urinalysis is not suggestive of urinary tract infection, urine culture is negative.  8. Metabolic syndrome. Lipid panel is not elevated. TSH normal.   9. Headaches. This is a new complaint over the weekend.the patient now claims that he's had headaches since his head injury in October 2015. This is addressed with ibuprofen.   10. ADLs. Occupational therapy consult was completed. it appears that low energy and tiredness are obstacles.   11. Cognitive decline. Psychological consultation completed and did not detect gross cognitive difficulties. Please see results.   12. Hypogonadism. We ordered testosterone injections.   13. ECT. After 9 days in the hospital the patient is still severely depressed, delusional, and suicidal. He was referred to [facility] for possible ECT treatment.  14. Disposition. To be established. He is homeless and uninsured, and without any support.   No medication changes were offered today. 04/10/2015.   Jeremiah [name] 04/10/2015, 10:48 AM

## 2015-04-10 NOTE — Plan of Care (Signed)
Problem: Alteration in thought process Goal: LTG-Patient has not harmed self or others in at least 2 days Patient has no harmed self or others and denies SI/HI

## 2015-04-10 NOTE — Plan of Care (Signed)
Problem: Alteration in thought process Goal: LTG-Patient behavior demonstrates decreased signs psychosis (Patient behavior demonstrates decreased signs of psychosis to the point the patient is safe to return home and continue treatment in an outpatient setting.)  Outcome: Progressing Patient cooperative with meds and plan of care.

## 2015-04-11 MED ORDER — HALOPERIDOL 5 MG PO TABS
5.0000 mg | ORAL_TABLET | Freq: Every day | ORAL | Status: DC
  Administered 2015-04-11 – 2015-04-27 (×17): 5 mg via ORAL
  Filled 2015-04-11 (×17): qty 1

## 2015-04-11 MED ORDER — METOPROLOL SUCCINATE ER 25 MG PO TB24
50.0000 mg | ORAL_TABLET | Freq: Every day | ORAL | Status: DC
  Administered 2015-04-12 – 2015-05-04 (×23): 50 mg via ORAL
  Filled 2015-04-11 (×16): qty 2
  Filled 2015-04-11: qty 1
  Filled 2015-04-11 (×8): qty 2

## 2015-04-11 NOTE — Progress Notes (Signed)
Patient with depressed affect and cooperative with meals and meds. Withdrawn and sleepy in bed this am with fair adls. Minimal interaction with peers, eats meal in dayroom and returns to bed. No SI/HI/AVH at this time. Encouraged patient to shower and sit in dayroom to increase activity and interaction, patient states " When I sit up my brain injury acts up and these people don't know what they are doing". Educated on s/s of depression but patient remains with fixed thoughts and is unable/unwillining to fully participate with recommended plan of care. Safety maintained.

## 2015-04-11 NOTE — Plan of Care (Signed)
Problem: Ineffective individual coping Goal: STG: Patient will remain free from self harm Outcome: Progressing Pt remains safe on the unit at this time

## 2015-04-11 NOTE — BHH Group Notes (Signed)
[place] LCSW Group Therapy  04/11/2015 12:06 PM  Type of Therapy:  Group Therapy  Participation Level:  Did Not Attend  Modes of Intervention:  Discussion, Education, Socialization and Support  Summary of Progress/Problems: Balance in life: Patients will discuss the concept of balance and how it looks and feels to be unbalanced. Pt will identify areas in their life that is unbalanced and ways to become more balanced.    [place] MSW, [place]  04/11/2015, 12:06 PM

## 2015-04-11 NOTE — Progress Notes (Addendum)
[facility] MD Progress Note  04/11/2015 7:04 AM Jeremiah [name]  MRN:  [id number]  Subjective:  [name] is again completely focused on his head injury. He believes that he is unable to walk or take a shower because his head is messed up. He stays in his room and in bed all the time. There is no reasoning with him. He has no insight into his problems. He still complains of constipation even though he has daily bowel movements. He has not improved at all. I will add Haldol to his regimen to address psychosis. He is taking Abilify and didn't get Abilify maintenance injection already.   Principal Problem: Major depressive disorder, recurrent episode, severe, with psychosis ([place]) Diagnosis:   Patient Active Problem List   Diagnosis Date Noted  . Major depressive disorder, recurrent episode, severe, with psychosis ([place]) [F33.3] 03/30/2015  . Homelessness [Z59.0] 03/29/2015  . Chronic constipation [K59.00] 03/29/2015  . 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 Cervical pain [M54.2] 01/18/2015  . Suicide threat or attempt [F48.9] 12/14/2014  . Cognitive and behavioral changes [R41.89, F91.9] 11/22/2014  . Microscopic hematuria [R31.29] 11/22/2014  . Disorder of ejaculation [N53.19] 11/22/2014  . 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 H/O autism spectrum disorder [Z86.59] 10/20/2014  . MDD (major depressive disorder), recurrent episode, with atypical features ([place]) [F33.9] 10/19/2014  . GAD (generalized anxiety disorder) [F41.1] 10/19/2014  . Tooth ache [K08.89] 10/19/2014  . Suicidal ideation [R45.851]   . Benign essential HTN [I10] 08/27/2014  . Affective bipolar disorder ([place]) [F31.9] 12/05/2013  . Decreased potassium in the blood [E87.6] 12/05/2013  . 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 Prostatitis [N41.9] 12/05/2013  . 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 Schizophrenia ([place])  [F20.9] 12/05/2013   Total Time spent with patient: 20 minutes  Past Psychiatric History: depression.  Past Medical History:  Past Medical History  Diagnosis Date  . Hypertension   . Rectal fistula   . Dysuria   . Diverticulosis   . Gastroparesis   . Borderline personality disorder   . Major depression ([place])   . Schizophrenia ([place])   . Hypokalemia   . Microscopic hematuria   . External hemorrhoid   . Over weight   . Nicotine addiction   . Difficulty urinating   . Stroke ([place])   . Benign essential HTN 08/27/2014  . BP (high blood pressure) 01/16/2015  . Anxiety     Past Surgical History  Procedure Laterality Date  . Lumbar spine surgery    . Colonoscopy    . Colonoscopy with propofol N/A 10/12/2014    Procedure: COLONOSCOPY WITH PROPOFOL;  Surgeon: Hulen Luster, MD;  Location: [facility] ENDOSCOPY;  Service: Gastroenterology;  Laterality: N/A;  . Tonsillectomy    . Back surgery     Family History:  Family History  Problem Relation Age of Onset  . Stroke Mother   . Stroke Maternal Grandmother   . Diabetes Mellitus II Sister    Family Psychiatric  History: none reported. Social History:  History  Alcohol Use No    Comment: last drink was in march 2016     History  Drug Use No    Social History   Social History  . Marital Status: Single    Spouse Name: N/A  . Number of Children: N/A  . Years of Education: N/A   Social History Main Topics  . Smoking status: Former Smoker  Types: Cigarettes    Quit date: 08/19/2014  . Smokeless tobacco: None  . Alcohol Use: No     Comment: last drink was in march 2016  . Drug Use: No  . Sexual Activity: Not Asked   Other Topics Concern  . None   Social History Narrative   Additional Social History:                         Sleep: Fair  Appetite:  Fair  Current Medications: Current Facility-Administered Medications  Medication Dose Route Frequency Provider Last Rate Last Dose  . acetaminophen (TYLENOL)  tablet 650 mg  650 mg Oral Q6H PRN Gonzella Lex, MD   650 mg at 04/09/15 2114  . alum & mag hydroxide-simeth (MAALOX/MYLANTA) 200-200-20 MG/5ML suspension 30 mL  30 mL Oral Q4H PRN Gonzella Lex, MD      . amLODipine (NORVASC) tablet 5 mg  5 mg Oral Daily Juri Dinning B Ivee Poellnitz, MD   5 mg at 04/10/15 0914  . ARIPiprazole (ABILIFY) tablet 20 mg  20 mg Oral QHS Clovis Fredrickson, MD   20 mg at 04/10/15 2136  . ARIPiprazole SUSR 400 mg  400 mg Intramuscular Q28 days Clovis Fredrickson, MD   400 mg at 04/06/15 1429  . diphenhydrAMINE (BENADRYL) capsule 50 mg  50 mg Oral QHS Gonzella Lex, MD   50 mg at 04/10/15 2135  . docusate sodium (COLACE) capsule 200 mg  200 mg Oral BID Clovis Fredrickson, MD   200 mg at 04/10/15 2135  . doxepin (SINEQUAN) capsule 50 mg  50 mg Oral QHS Clovis Fredrickson, MD   50 mg at 04/10/15 2136  . ibuprofen (ADVIL,MOTRIN) tablet 600 mg  600 mg Oral Q6H PRN Gonzella Lex, MD   600 mg at 04/04/15 1444  . magnesium hydroxide (MILK OF MAGNESIA) suspension 30 mL  30 mL Oral Daily PRN Gonzella Lex, MD      . metoprolol succinate (TOPROL-XL) 24 hr tablet 100 mg  100 mg Oral Daily Anetra Czerwinski B Daylen Hack, MD   100 mg at 04/10/15 0913  . polyethylene glycol (MIRALAX / GLYCOLAX) packet 17 g  17 g Oral Daily Clovis Fredrickson, MD   17 g at 04/10/15 0914  . temazepam (RESTORIL) capsule 30 mg  30 mg Oral QHS PRN Clovis Fredrickson, MD   30 mg at 04/10/15 2136  . testosterone enanthate (DELATESTRYL) injection 200 mg  200 mg Intramuscular Q28 days Clovis Fredrickson, MD   200 mg at 04/06/15 1143  . venlafaxine XR (EFFEXOR-XR) 24 hr capsule 300 mg  300 mg Oral Q breakfast Raeleigh Guinn B Azyiah Bo, MD   300 mg at 04/10/15 [id number]    Lab Results: No results found for this or any previous visit (from the past 48 hour(s)).  Physical Findings: AIMS:  , ,  ,  ,    CIWA:    COWS:     Musculoskeletal: Strength & Muscle Tone: within normal limits Gait & Station: normal Patient  leans: N/A  Psychiatric Specialty Exam: Review of Systems  Gastrointestinal: Positive for constipation.  Neurological: Positive for headaches.  All other systems reviewed and are negative.   Blood pressure 96/70, pulse 60, temperature 98.7 F (37.1 C), temperature source Oral, resp. rate 20, height 5\' 7"  (1.702 m), weight 67.132 kg (148 lb), SpO2 100 %.Body mass index is 23.17 kg/(m^2).  General Appearance: Disheveled  Eye Contact::  Absent  Speech:  Clear and Coherent  Volume:  Normal  Mood:  Depressed, Hopeless and Worthless  Affect:  Flat  Thought Process:  Disorganized  Orientation:  Full (Time, Place, and Person)  Thought Content:  Delusions and Paranoid Ideation  Suicidal Thoughts:  Yes.  with intent/plan  Homicidal Thoughts:  No  Memory:  Immediate;   Poor Recent;   Poor Remote;   Poor  Judgement:  Poor  Insight:  Lacking  Psychomotor Activity:  Decreased  Concentration:  Poor  Recall:  Poor  Fund of Knowledge:Poor  Language: Poor  Akathisia:  No  Handed:  Right  AIMS (if indicated):     Assets:  Communication Skills Desire for Improvement Physical Health Resilience  ADL's:  Intact  Cognition: WNL  Sleep:  Number of Hours: 6.15   Treatment Plan Summary: Daily contact with patient to assess and evaluate symptoms and progress in treatment and Medication management   Mr. Bruzek is a 49 year old male with a history of depression and psychosis admitting floridly psychotic and depressed in the context of medication noncompliance and severe social stressors.  1. Suicidal ideation. The patient is still suicidal. He is able to contract for safety in the hospital.  2. Mood. The patient was started on Prozac for depression and anxiety and his dose was titrated to 60 mg and Abilify for psychosis. Abilify was increased to 20 mg daily. He accepted Abilify maintena injection. We will discontinue Prozac and start Effexor 300 mg daily for depression. With Haldol 5 mg nightly  for psychosis.  3. Anxiety. The patient reportedly was prescribed Xanax in the community. Given concerns about cognitive decline, we offered Librium taper.  4. Hypertension. We restarted metoprolol, clonidine and Norvasc. Clonidine was discontinued  already due to low blood pressure. Will lower other antihypertensives as his blood pressure remains low.  5. Constipation. It is unclear if this is delusional. Seems like constant complaint. The patient is on bowel regimen. He reports feeling less constipated with improved appetite.  6. Insomnia. The patient claims not to be able to sleep for months. He did not sleep well with 30 mg of Restoril. When we added trazodone last night he slept only 2 hours complaining of stuffy nose from trazodone. We will discontinue trazodone and start doxepin.    7. Urinary tract infection. The patient has a history of prostatitis he believes that he needs to be on antibiotic. Urinalysis is not suggestive of urinary tract infection, urine culture is negative.  8. Metabolic syndrome. Lipid panel is not elevated. TSH normal.   9. Headaches. This is a new complaint over the weekend.the patient now claims that he's had headaches since his head injury in October 2015. This is addressed with ibuprofen.   10. ADLs. Occupational therapy consult was completed. it appears that low energy and tiredness are obstacles.   11. Cognitive decline. Psychological consultation completed and did not detect gross cognitive difficulties. Please see results.   12. Hypogonadism. We ordered testosterone injections.   13. ECT. After 9 days in the hospital the patient is still severely depressed, delusional, and suicidal. He was referred to [facility] for possible ECT treatment.  14. Disposition. To be established. He is homeless and uninsured, and without any support.   I certify that the services received since the previous certification/recertification were and  continue to be medically necessary as the treatment provided can be reasonably expected to improve the patient's condition; the medical record documents that the services furnished were intensive treatment services or  their equivalent services, and this patient continues to need, on a daily basis, active treatment furnished directly by or requiring the supervision of inpatient psychiatric personnel.   Kierah Goatley 04/11/2015, 7:04 AM

## 2015-04-11 NOTE — Progress Notes (Signed)
Remains withdrawn and sleepy in bed between meals. Safety maintained.
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
## 2015-04-11 NOTE — Plan of Care (Signed)
Problem: Ineffective individual coping Goal: LTG: Patient will report a decrease in negative feelings Outcome: Not Progressing Patient remains with fixed thoughts that he has a TBI and is unable to shower, sit up and/or actively participate in plan of care.

## 2015-04-12 MED ORDER — DOXEPIN HCL 100 MG PO CAPS
100.0000 mg | ORAL_CAPSULE | Freq: Every day | ORAL | Status: DC
  Administered 2015-04-12: 100 mg via ORAL
  Filled 2015-04-12: qty 1

## 2015-04-12 NOTE — BHH Group Notes (Signed)
[place] Group Notes:  (Nursing/MHT/Case Management/Adjunct)  Date:  04/12/2015  Time:  2:28 PM  Type of Therapy:  Psychoeducational Skills  Participation Level:  Active  Participation Quality:  Appropriate, Attentive and Supportive  Affect:  Flat  Cognitive:  Appropriate  Insight:  Appropriate  Engagement in Group:  Engaged and Supportive  Modes of Intervention:  Discussion and Socialization  Summary of Progress/Problems:  Jeremiah [name] 04/12/2015, 2:28 PM

## 2015-04-12 NOTE — Plan of Care (Signed)
Problem: Ineffective individual coping Goal: STG: Patient will remain free from self harm Outcome: Progressing Pt remains free from harm and takes medications as prescribed.  Problem: Alteration in thought process Goal: STG-Patient is able to discuss thoughts with staff Outcome: Progressing Pt reports thoughts and feelings with staff when approached.

## 2015-04-12 NOTE — Progress Notes (Signed)
Recreation Therapy Notes  Date: 11.21.16 Time: 3:00 pm Location: Craft Room  Group Topic: Self-expression  Goal Area(s) Addresses:  Patient will identify one color per emotion listed on wheel. Patient will verbalize benefit of using art as a means of self-expression. Patient will verbalize one emotion experienced during group. Patient will be educated on other forms of self-expression.  Behavioral Response: Attentive, Interactive  Intervention: Emotion Wheel  Activity: Patients were given a worksheet with 7 different emotions listed. Patients were instructed to pick a color for each emotion.  Education: LRT educated patient on different forms of self-expression.  Education Outcome: In group clarification offered  Clinical Observations/Feedback: Patient completed activity by picking a color for each emotion. Patient contributed to group discussion by stating what color he picked for certain emotions and why.  Leonette Monarch, LRT/CTRS 04/12/2015 4:25 PM

## 2015-04-12 NOTE — Progress Notes (Signed)
D: Pt remains isolative in room. Pt with depressed affect. Pt did not attend evening wrap-up group, however he did go to the dayroom to watch TV with peers during snack time. Pt verbalized a desire to take a shower and after much encouragement, got up and took one. When asked if pt was experiencing SI he stated "yes but I can't walk out in front of traffic while I'm in here." Pt contracts for safety. Denies HI. When asked if he was experiencing AVH, pt stated "It'd be better to hear them; that shows imagination." Pt remains preoccupied with his belief that he has a brain injury, stating that "no one here knows what they're doing." Denies pain. Pt otherwise remains calm and cooperative. A: Medications given as prescribed. Emotional support and encouragement given. Pt encouraged to attend groups. q15 minute safety checks maintained. R: Pt remains free from harm.

## 2015-04-12 NOTE — Progress Notes (Signed)
Patient with depressed affect, slightly brighter and improved eye contact. Cooperative with meds, meals and plan of care. Patient states his goal today is to go to 2 groups. Patient adls improved, he showered last night and he dressed in his clothes this morning. No SI/HI/AVH at this time. Safety maintained.

## 2015-04-12 NOTE — Progress Notes (Signed)
Patient with improved adls , took shower last night. Dressed in his own clothes today. Noted to be increasingly visible on unit. Noted to talk with select male peer. Increased eye contact with staff.

## 2015-04-12 NOTE — BHH Group Notes (Signed)
[facility] LCSW Group Therapy  04/12/2015 2:18 PM  Type of Therapy:  Group Therapy  Participation Level:  Active  Participation Quality:  Appropriate and Attentive  Affect:  Blunted  Cognitive:  Alert and Appropriate  Insight:  Limited  Engagement in Therapy:  Improving  Modes of Intervention:  Socialization and Support  Summary of Progress/Problems: Patient attended and participated in group discussion but appeared disorganized at times. Patient introduced himself and shared that he "use to get joy from water skiing, swimming, camping, fishing, horses, motor cycles, mountain biking, and building things". Patient shared that his wife would buy his 2 children Christmas presents and he could not compete with the amount she spent on them. Patient also stated that he could offer his children things she would not do with them like camping and spending time outdoors with them. Patient was able to recognize that it was good that his ex-wife was able to provide so much for his children and patient shared that changing his perspective will be a tool he tries to use.   Keene Breath, MSW, LCSWA 04/12/2015, 2:18 PM

## 2015-04-12 NOTE — Progress Notes (Signed)
[facility] MD Progress Note  04/12/2015 10:25 AM AVEL CUBBISON  MRN:  [id number]  Subjective:  Mr. Gilleland seems to be better today. He took a shower last night and is out of bed this morning. He still complains of severe depression, traumatic brain injury, and insomnia. He slept over 6 hours last night. He complains of suicidal ideation with a plan to step in front of the traffic after discharge. He did not complain of headache or constipation today. He promises to go to group. He seems to tolerate medications well.  Principal Problem: Major depressive disorder, recurrent episode, severe, with psychosis ([place]) Diagnosis:   Patient Active Problem List   Diagnosis Date Noted  . Major depressive disorder, recurrent episode, severe, with psychosis ([place]) [F33.3] 03/30/2015  . Homelessness [Z59.0] 03/29/2015  . Chronic constipation [K59.00] 03/29/2015  . 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 Cervical pain [M54.2] 01/18/2015  . Suicide threat or attempt [F48.9] 12/14/2014  . Cognitive and behavioral changes [R41.89, F91.9] 11/22/2014  . Microscopic hematuria [R31.29] 11/22/2014  . Disorder of ejaculation [N53.19] 11/22/2014  . 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 H/O autism spectrum disorder [Z86.59] 10/20/2014  . MDD (major depressive disorder), recurrent episode, with atypical features ([place]) [F33.9] 10/19/2014  . GAD (generalized anxiety disorder) [F41.1] 10/19/2014  . Tooth ache [K08.89] 10/19/2014  . Suicidal ideation [R45.851]   . Benign essential HTN [I10] 08/27/2014  . Affective bipolar disorder ([place]) [F31.9] 12/05/2013  . Decreased potassium in the blood [E87.6] 12/05/2013  . 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 Prostatitis [N41.9] 12/05/2013  . 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 Schizophrenia ([place]) [F20.9] 12/05/2013   Total Time spent with patient: 20 minutes  Past  Psychiatric History: Depression and psychosis.  Past Medical History:  Past Medical History  Diagnosis Date  . Hypertension   . Rectal fistula   . Dysuria   . Diverticulosis   . Gastroparesis   . Borderline personality disorder   . Major depression ([place])   . Schizophrenia ([place])   . Hypokalemia   . Microscopic hematuria   . External hemorrhoid   . Over weight   . Nicotine addiction   . Difficulty urinating   . Stroke ([place])   . Benign essential HTN 08/27/2014  . BP (high blood pressure) 01/16/2015  . Anxiety     Past Surgical History  Procedure Laterality Date  . Lumbar spine surgery    . Colonoscopy    . Colonoscopy with propofol N/A 10/12/2014    Procedure: COLONOSCOPY WITH PROPOFOL;  Surgeon: Hulen Luster, MD;  Location: [facility] ENDOSCOPY;  Service: Gastroenterology;  Laterality: N/A;  . Tonsillectomy    . Back surgery     Family History:  Family History  Problem Relation Age of Onset  . Stroke Mother   . Stroke Maternal Grandmother   . Diabetes Mellitus II Sister    Family Psychiatric  History: None reported. Social History:  History  Alcohol Use No    Comment: last drink was in march 2016     History  Drug Use No    Social History   Social History  . Marital Status: Single    Spouse Name: N/A  . Number of Children: N/A  . Years of Education: N/A   Social History Main Topics  . Smoking status: Former Smoker    Types: Cigarettes    Quit date: 08/19/2014  . Smokeless tobacco: None  .  Alcohol Use: No     Comment: last drink was in march 2016  . Drug Use: No  . Sexual Activity: Not Asked   Other Topics Concern  . None   Social History Narrative   Additional Social History:                         Sleep: Fair  Appetite:  Fair  Current Medications: Current Facility-Administered Medications  Medication Dose Route Frequency Provider Last Rate Last Dose  . acetaminophen (TYLENOL) tablet 650 mg  650 mg Oral Q6H PRN Gonzella Lex, MD   650 mg  at 04/09/15 2114  . alum & mag hydroxide-simeth (MAALOX/MYLANTA) 200-200-20 MG/5ML suspension 30 mL  30 mL Oral Q4H PRN Gonzella Lex, MD      . amLODipine (NORVASC) tablet 5 mg  5 mg Oral Daily Clovis Fredrickson, MD   5 mg at 04/12/15 0917  . ARIPiprazole (ABILIFY) tablet 20 mg  20 mg Oral QHS Clovis Fredrickson, MD   20 mg at 04/11/15 2206  . ARIPiprazole SUSR 400 mg  400 mg Intramuscular Q28 days Clovis Fredrickson, MD   400 mg at 04/06/15 1429  . diphenhydrAMINE (BENADRYL) capsule 50 mg  50 mg Oral QHS Gonzella Lex, MD   50 mg at 04/11/15 2205  . docusate sodium (COLACE) capsule 200 mg  200 mg Oral BID Clovis Fredrickson, MD   200 mg at 04/12/15 0916  . doxepin (SINEQUAN) capsule 50 mg  50 mg Oral QHS Geordie Nooney B Caeli Linehan, MD   50 mg at 04/11/15 2205  . haloperidol (HALDOL) tablet 5 mg  5 mg Oral QHS Clovis Fredrickson, MD   5 mg at 04/11/15 2205  . ibuprofen (ADVIL,MOTRIN) tablet 600 mg  600 mg Oral Q6H PRN Gonzella Lex, MD   600 mg at 04/04/15 1444  . magnesium hydroxide (MILK OF MAGNESIA) suspension 30 mL  30 mL Oral Daily PRN Gonzella Lex, MD      . metoprolol succinate (TOPROL-XL) 24 hr tablet 50 mg  50 mg Oral Daily Sakai Heinle B Ramsay Bognar, MD   50 mg at 04/12/15 0916  . polyethylene glycol (MIRALAX / GLYCOLAX) packet 17 g  17 g Oral Daily Clovis Fredrickson, MD   17 g at 04/12/15 0917  . temazepam (RESTORIL) capsule 30 mg  30 mg Oral QHS PRN Clovis Fredrickson, MD   30 mg at 04/11/15 2206  . testosterone enanthate (DELATESTRYL) injection 200 mg  200 mg Intramuscular Q28 days Clovis Fredrickson, MD   200 mg at 04/06/15 1143  . venlafaxine XR (EFFEXOR-XR) 24 hr capsule 300 mg  300 mg Oral Q breakfast Ha Placeres B Celia Friedland, MD   300 mg at 04/12/15 [id number]    Lab Results: No results found for this or any previous visit (from the past 48 hour(s)).  Physical Findings: AIMS:  , ,  ,  ,    CIWA:    COWS:     Musculoskeletal: Strength & Muscle Tone: within normal  limits Gait & Station: normal Patient leans: N/A  Psychiatric Specialty Exam: Review of Systems  Psychiatric/Behavioral: The patient has insomnia.   All other systems reviewed and are negative.   Blood pressure 121/85, pulse 56, temperature 97.6 F (36.4 C), temperature source Oral, resp. rate 20, height 5\' 7"  (1.702 m), weight 67.132 kg (148 lb), SpO2 100 %.Body mass index is 23.17 kg/(m^2).  General Appearance: Fairly Groomed  Eye Contact::  Fair  Speech:  Slow  Volume:  Decreased  Mood:  Depressed, Hopeless and Worthless  Affect:  Flat  Thought Process:  Disorganized  Orientation:  Full (Time, Place, and Person)  Thought Content:  Delusions and Paranoid Ideation  Suicidal Thoughts:  Yes.  with intent/plan  Homicidal Thoughts:  No  Memory:  Immediate;   Poor Recent;   Poor Remote;   Poor  Judgement:  Poor  Insight:  Lacking  Psychomotor Activity:  Decreased  Concentration:  Poor  Recall:  Poor  Fund of Knowledge:Poor  Language: Fair  Akathisia:  No  Handed:  Right  AIMS (if indicated):     Assets:  Communication Skills Desire for Improvement Physical Health Resilience  ADL's:  Intact  Cognition: WNL  Sleep:  Number of Hours: 5.45   Treatment Plan Summary: Daily contact with patient to assess and evaluate symptoms and progress in treatment and Medication management   Mr. Jamerson is a 49 year old male with a history of depression and psychosis admitting floridly psychotic and depressed in the context of medication noncompliance and severe social stressors.  1. Suicidal ideation. The patient is still suicidal. He is able to contract for safety in the hospital.  2. Mood. The patient is taking Effexor for depression and anxiety and Abilify for psychosis. Abilify was increased to 20 mg daily. He accepted Abilify maintena injection. We added Haldol 5 mg nightly for psychosis.  3. Anxiety. The patient reportedly was prescribed Xanax in the community. Given concerns about  cognitive decline, he completed Librium taper.  4. Hypertension. We restarted metoprolol, clonidine and Norvasc. Clonidine was discontinued already due to low blood pressure. We lowered Metoprolol to 50 mg. Blood pressure is normal.   5. Constipation. It is unclear if this is delusional. Seems like constant complaint. The patient is on bowel regimen. He reports feeling less constipated with improved appetite.  6. Insomnia. The patient claims not to be able to sleep for months. He is on high dose restoril and Sinequan. Will increase Sineguan.    7. Urinary tract infection. The patient has a history of prostatitis he believes that he needs to be on antibiotic. Urinalysis is not suggestive of urinary tract infection, urine culture is negative.  8. Metabolic syndrome. Lipid panel is not elevated. TSH normal.   9. Headaches. This is a new complaint over the weekend.the patient now claims that he's had headaches since his head injury in October 2015. This is addressed with ibuprofen.   10. ADLs. Occupational therapy consult was completed. it appears that low energy and tiredness are major obstacles.   11. Cognitive decline. Psychological consultation completed and did not detect gross cognitive difficulties. Please see results.   12. Hypogonadism. We ordered testosterone injections.   13. ECT. After 9 days in the hospital the patient is still severely depressed, delusional, and suicidal. He was referred to [facility] for possible ECT treatment.  14. Disposition. To be established. He is homeless and uninsured, and without any support.    Samai Corea 04/12/2015, 10:25 AM

## 2015-04-13 MED ORDER — DOXEPIN HCL 25 MG PO CAPS
150.0000 mg | ORAL_CAPSULE | Freq: Every day | ORAL | Status: DC
  Administered 2015-04-13: 150 mg via ORAL
  Filled 2015-04-13: qty 1
  Filled 2015-04-13 (×2): qty 2
  Filled 2015-04-13: qty 1

## 2015-04-13 NOTE — Progress Notes (Signed)
[facility] MD Progress Note  04/13/2015 2:48 PM TREXTON STACHOWSKI  MRN:  [id number]  Subjective:  Mr. Braden seems slightly better. He took a shower for the first time yesterday. For the first time he participated in group. Reportedly he did well. He still complains of depression, anxiety, and severe insomnia. He had difficulties falling asleep, woke up at 2:00 in the morning and could not go back to sleep. Staff reported 6 hours of sleep. He is less somatic and does not complain of constipation or headache. He sits up in bed when I walked to his room today showing a little more energy.   Principal Problem: Major depressive disorder, recurrent episode, severe, with psychosis ([place]) Diagnosis:   Patient Active Problem List   Diagnosis Date Noted  . Major depressive disorder, recurrent episode, severe, with psychosis ([place]) [F33.3] 03/30/2015  . Homelessness [Z59.0] 03/29/2015  . Chronic constipation [K59.00] 03/29/2015  . 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 Cervical pain [M54.2] 01/18/2015  . Suicide threat or attempt [F48.9] 12/14/2014  . Cognitive and behavioral changes [R41.89, F91.9] 11/22/2014  . Microscopic hematuria [R31.29] 11/22/2014  . Disorder of ejaculation [N53.19] 11/22/2014  . 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 H/O autism spectrum disorder [Z86.59] 10/20/2014  . MDD (major depressive disorder), recurrent episode, with atypical features ([place]) [F33.9] 10/19/2014  . GAD (generalized anxiety disorder) [F41.1] 10/19/2014  . Tooth ache [K08.89] 10/19/2014  . Suicidal ideation [R45.851]   . Benign essential HTN [I10] 08/27/2014  . Affective bipolar disorder ([place]) [F31.9] 12/05/2013  . Decreased potassium in the blood [E87.6] 12/05/2013  . 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 Prostatitis [N41.9] 12/05/2013  . 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 Schizophrenia ([place])  [F20.9] 12/05/2013   Total Time spent with patient: 20 minutes  Past Psychiatric History: Depression and psychosis.  Past Medical History:  Past Medical History  Diagnosis Date  . Hypertension   . Rectal fistula   . Dysuria   . Diverticulosis   . Gastroparesis   . Borderline personality disorder   . Major depression ([place])   . Schizophrenia ([place])   . Hypokalemia   . Microscopic hematuria   . External hemorrhoid   . Over weight   . Nicotine addiction   . Difficulty urinating   . Stroke ([place])   . Benign essential HTN 08/27/2014  . BP (high blood pressure) 01/16/2015  . Anxiety     Past Surgical History  Procedure Laterality Date  . Lumbar spine surgery    . Colonoscopy    . Colonoscopy with propofol N/A 10/12/2014    Procedure: COLONOSCOPY WITH PROPOFOL;  Surgeon: Hulen Luster, MD;  Location: [facility] ENDOSCOPY;  Service: Gastroenterology;  Laterality: N/A;  . Tonsillectomy    . Back surgery     Family History:  Family History  Problem Relation Age of Onset  . Stroke Mother   . Stroke Maternal Grandmother   . Diabetes Mellitus II Sister    Family Psychiatric  History: None reported. Social History:  History  Alcohol Use No    Comment: last drink was in march 2016     History  Drug Use No    Social History   Social History  . Marital Status: Single    Spouse Name: N/A  . Number of Children: N/A  . Years of Education: N/A   Social History Main Topics  . Smoking status: Former Smoker  Types: Cigarettes    Quit date: 08/19/2014  . Smokeless tobacco: None  . Alcohol Use: No     Comment: last drink was in march 2016  . Drug Use: No  . Sexual Activity: Not Asked   Other Topics Concern  . None   Social History Narrative   Additional Social History:                         Sleep: Poor  Appetite:  Fair  Current Medications: Current Facility-Administered Medications  Medication Dose Route Frequency Provider Last Rate Last Dose  . acetaminophen  (TYLENOL) tablet 650 mg  650 mg Oral Q6H PRN Gonzella Lex, MD   650 mg at 04/09/15 2114  . alum & mag hydroxide-simeth (MAALOX/MYLANTA) 200-200-20 MG/5ML suspension 30 mL  30 mL Oral Q4H PRN Gonzella Lex, MD      . amLODipine (NORVASC) tablet 5 mg  5 mg Oral Daily Ghali Morissette B Zeeshan Korte, MD   5 mg at 04/13/15 1014  . ARIPiprazole (ABILIFY) tablet 20 mg  20 mg Oral QHS Clovis Fredrickson, MD   20 mg at 04/12/15 2136  . ARIPiprazole SUSR 400 mg  400 mg Intramuscular Q28 days Clovis Fredrickson, MD   400 mg at 04/06/15 1429  . diphenhydrAMINE (BENADRYL) capsule 50 mg  50 mg Oral QHS Gonzella Lex, MD   50 mg at 04/12/15 2137  . docusate sodium (COLACE) capsule 200 mg  200 mg Oral BID Kamryn Messineo B Astrid Vides, MD   200 mg at 04/13/15 1013  . doxepin (SINEQUAN) capsule 100 mg  100 mg Oral QHS Fronia Depass B Erik Nessel, MD   100 mg at 04/12/15 2136  . haloperidol (HALDOL) tablet 5 mg  5 mg Oral QHS Clovis Fredrickson, MD   5 mg at 04/12/15 2136  . ibuprofen (ADVIL,MOTRIN) tablet 600 mg  600 mg Oral Q6H PRN Gonzella Lex, MD   600 mg at 04/04/15 1444  . magnesium hydroxide (MILK OF MAGNESIA) suspension 30 mL  30 mL Oral Daily PRN Gonzella Lex, MD      . metoprolol succinate (TOPROL-XL) 24 hr tablet 50 mg  50 mg Oral Daily Pari Lombard B Feliz Herard, MD   50 mg at 04/13/15 1013  . polyethylene glycol (MIRALAX / GLYCOLAX) packet 17 g  17 g Oral Daily Zacherie Honeyman B Deborra Phegley, MD   17 g at 04/13/15 1014  . temazepam (RESTORIL) capsule 30 mg  30 mg Oral QHS PRN Clovis Fredrickson, MD   30 mg at 04/12/15 2139  . testosterone enanthate (DELATESTRYL) injection 200 mg  200 mg Intramuscular Q28 days Clovis Fredrickson, MD   200 mg at 04/06/15 1143  . venlafaxine XR (EFFEXOR-XR) 24 hr capsule 300 mg  300 mg Oral Q breakfast Aaren Krog B Alphons Burgert, MD   300 mg at 04/13/15 0759    Lab Results: No results found for this or any previous visit (from the past 48 hour(s)).  Physical Findings: AIMS:  , ,  ,  ,    CIWA:     COWS:     Musculoskeletal: Strength & Muscle Tone: within normal limits Gait & Station: normal Patient leans: N/A  Psychiatric Specialty Exam: Review of Systems  Psychiatric/Behavioral: Positive for depression. The patient has insomnia.   All other systems reviewed and are negative.   Blood pressure 119/84, pulse 65, temperature 97.6 F (36.4 C), temperature source Oral, resp. rate 20, height 5\' 7"  (1.702 m), weight  67.132 kg (148 lb), SpO2 100 %.Body mass index is 23.17 kg/(m^2).  General Appearance: Fairly Groomed  Engineer, water::  Fair  Speech:  Clear and Coherent  Volume:  Normal  Mood:  Depressed, Hopeless and Worthless  Affect:  Flat  Thought Process:  Disorganized  Orientation:  Full (Time, Place, and Person)  Thought Content:  Delusions and Paranoid Ideation  Suicidal Thoughts:  Yes.  with intent/plan  Homicidal Thoughts:  No  Memory:  Immediate;   Poor Recent;   Poor Remote;   Poor  Judgement:  Poor  Insight:  Lacking  Psychomotor Activity:  Decreased  Concentration:  Poor  Recall:  Poor  Fund of Knowledge:Poor  Language: Poor  Akathisia:  No  Handed:  Right  AIMS (if indicated):     Assets:  Communication Skills Desire for Improvement Physical Health Resilience  ADL's:  Intact  Cognition: WNL  Sleep:  Number of Hours: 5.45   Treatment Plan Summary: Daily contact with patient to assess and evaluate symptoms and progress in treatment and Medication management   Mr. Brean is a 49 year old male with a history of depression and psychosis admitting floridly psychotic and depressed in the context of medication noncompliance and severe social stressors.  1. Suicidal ideation. The patient is still suicidal. He is able to contract for safety in the hospital.  2. Mood. The patient is taking Effexor for depression and anxiety and Abilify for psychosis. Abilify was increased to 20 mg daily. He accepted Abilify maintena injection. We added Haldol 5 mg nightly for  psychosis.  3. Anxiety. The patient reportedly was prescribed Xanax in the community. Given concerns about cognitive decline, he completed Librium taper.  4. Hypertension. We restarted metoprolol, clonidine and Norvasc. Clonidine was discontinued already due to low blood pressure. We lowered Metoprolol to 50 mg. Blood pressure is normal.   5. Constipation. It is unclear if this is delusional. Seems like constant complaint. The patient is on bowel regimen. He reports feeling less constipated with improved appetite.  6. Insomnia. The patient claims not to be able to sleep for months. He is on high dose restoril and Sinequan. Will increase Sineguan to 150 mg.    7. Urinary tract infection. The patient has a history of prostatitis he believes that he needs to be on antibiotic. Urinalysis is not suggestive of urinary tract infection, urine culture is negative.  8. Metabolic syndrome. Lipid panel is not elevated. TSH normal.   9. Headaches. This is a new complaint over the weekend.the patient now claims that he's had headaches since his head injury in October 2015. This is addressed with ibuprofen.   10. ADLs. Occupational therapy consult was completed. it appears that low energy and tiredness are major obstacles.   11. Cognitive decline. Psychological consultation completed and did not detect gross cognitive difficulties. Please see results.   12. Hypogonadism. We ordered testosterone injections.   13. ECT. After 9 days in the hospital the patient is still severely depressed, delusional, and suicidal. He was referred to [facility] for possible ECT treatment.  14. Disposition. To be established. He is homeless and uninsured, and without any support.     Byanca Kasper 04/13/2015, 2:48 PM

## 2015-04-13 NOTE — Plan of Care (Signed)
Problem: Alteration in mood Goal: LTG-Patient reports reduction in suicidal thoughts (Patient reports reduction in suicidal thoughts and is able to verbalize a safety plan for whenever patient is feeling suicidal)  Outcome: Not Progressing Pt SI at this time but contracts for safety.

## 2015-04-13 NOTE — Progress Notes (Signed)
Recreation Therapy Notes  Date: 11.22.16 Time: 3:00 pm Location: Craft room  Group Topic: Goal Setting  Goal Area(s) Addresses:  Patient will write at least one goal. Patient will write at least one obstacle.  Behavioral Response: Attentive, Interactive  Intervention: Recovery Goal Chart  Activity: Patients were instructed to make a recovery goal chart including goals, obstacles, the date they started working on their goals, and the date they achieved their goals.  Education:LRT educated patients on healthy ways they can celebrate reaching their goals.  Education Outcome: In group clarification offered  Clinical Observations/Feedback: Patient completed activity by writing 3 goals and obstacles. Patient contributed to group discussion.  Leonette Monarch, LRT/CTRS 04/13/2015 4:34 PM

## 2015-04-13 NOTE — [facility] Group Notes (Signed)
[place] Group Notes:  (Nursing/MHT/Case Management/Adjunct)  Date:  04/13/2015  Time:  2:13 AM  Type of Therapy:  Group Therapy  Participation Level:  Active  Participation Quality:  Appropriate  Affect:  Appropriate  Cognitive:  Appropriate  Insight:  Appropriate  Engagement in Group:  Developing/Improving  Modes of Intervention:  n/a  Summary of Progress/Problems:  Jeremiah [name] 04/13/2015, 2:13 AM

## 2015-04-13 NOTE — [facility] Group Notes (Signed)
[place] Group Notes:  (Nursing/MHT/Case Management/Adjunct)  Date:  04/13/2015  Time:  2:17 PM  Type of Therapy:  Psychoeducational Skills  Participation Level:  Active  Participation Quality:  Appropriate, Attentive and Supportive  Affect:  Appropriate  Cognitive:  Appropriate  Insight:  Good  Engagement in Group:  Developing/Improving  Modes of Intervention:  Discussion, Education and Support  Cheryll Dessert 04/13/2015, 2:17 PM
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
## 2015-04-13 NOTE — Progress Notes (Signed)
Patient is pleasant this shift.  Patient states that he has depression and SI that comes and goes.  Patient verbally contracted for safety.  Patient is compliant with medications.  Patient seen in the milieu interacting with others appropriately.   Support and encouragement provided.  Medications given as prescribed.  q 15 min checks done.   Patient receptive of information given

## 2015-04-13 NOTE — Plan of Care (Signed)
Problem: Ineffective individual coping Goal: LTG: Patient will report a decrease in negative feelings Outcome: Progressing Patient reported a decrease in negative feelings this shift

## 2015-04-13 NOTE — Tx Team (Signed)
Interdisciplinary Treatment Plan Update (Adult)  Date:  04/13/2015 Time Reviewed:  3:42 PM  Progress in Treatment: Attending groups: No. Participating in groups:  No. Taking medication as prescribed:  Yes. Tolerating medication:  Yes. Family/Significant othe contact made:  No, will contact:  pt refused  Patient understands diagnosis:  No. and As evidenced by:  Limited insight  Discussing patient identified problems/goals with staff:  Yes. Medical problems stabilized or resolved:  Yes. Denies suicidal/homicidal ideation: Yes. Issues/concerns per patient self-inventory:  Yes. Other:  New problem(s) identified: No, Describe:  NA  Discharge Plan or Barriers: Pt plans to return home and follow up with outpatient.    Reason for Continuation of Hospitalization: Delusions  Medication stabilization  Suicidal Ideations   Comments:[name] seems slightly better. He took a shower for the first time yesterday. For the first time he participated in group. Reportedly he did well. He still complains of depression, anxiety, and severe insomnia. He had difficulties falling asleep, woke up at 2:00 in the morning and could not go back to sleep. Staff reported 6 hours of sleep. He is less somatic and does not complain of constipation or headache. He sits up in bed when I walked to his room today showing a little more energy.   Estimated length of stay: 7 days   New goal(s):  Review of initial/current patient goals per problem list:   1.  Goal(s): Patient will participate in aftercare plan * Met:  * Target date: at discharge * As evidenced by: Patient will participate within aftercare plan AEB aftercare provider and housing plan at discharge being identified.   2.  Goal (s): Patient will exhibit decreased depressive symptoms and suicidal ideations. * Met:  *  Target date: at discharge * As evidenced by: Patient will utilize self rating of depression at 3 or below and demonstrate decreased  signs of depression or be deemed stable for discharge by MD.  2.  Goal (s): Patient will demonstrate decreased symptoms of psychosis. * Met: No  *  Target date: at discharge * As evidenced by: Patient will not endorse signs of psychosis or be deemed stable for discharge by MD. *   Attendees: Patient:  Jeremiah [name] 11/22/20163:42 PM  Family:   11/22/20163:42 PM  Physician:   Dr. Bary Leriche  11/22/20163:42 PM  Nursing:   Nira Conn, RN  11/22/20163:42 PM  Case Manager:   11/22/20163:42 PM  Counselor:   11/22/20163:42 PM  Other:  Wray Kearns, [place] 11/22/20163:42 PM  Other:  Everitt Amber, [place]  11/22/20163:42 PM  Other:   11/22/20163:42 PM  Other:  11/22/20163:42 PM  Other:  11/22/20163:42 PM  Other:  11/22/20163:42 PM  Other:  11/22/20163:42 PM  Other:  11/22/20163:42 PM  Other:  11/22/20163:42 PM  Other:   11/22/20163:42 PM   Scribe for Treatment Team:   Wray Kearns, MSW, LCSWA  04/13/2015, 3:42 PM

## 2015-04-14 MED ORDER — DOXEPIN HCL 50 MG PO CAPS
150.0000 mg | ORAL_CAPSULE | Freq: Every day | ORAL | Status: DC
  Administered 2015-04-14 – 2015-04-28 (×15): 150 mg via ORAL
  Filled 2015-04-14 (×16): qty 3

## 2015-04-14 NOTE — [facility] Group Notes (Signed)
[place] Group Notes:  (Nursing/MHT/Case Management/Adjunct)  Date:  04/14/2015  Time:  11:56 AM  Type of Therapy:  Psychoeducational Skills  Participation Level:  Active  Participation Quality:  Attentive, Sharing and Supportive  Affect:  Flat  Cognitive:  Appropriate  Insight:  Appropriate  Engagement in Group:  Engaged and Supportive  Modes of Intervention:  Discussion and Socialization  Summary of Progress/Problems:  Jeremiah [name] 04/14/2015, 11:56 AM
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
## 2015-04-14 NOTE — Clinical Social Work Note (Signed)
CSW returned call to [facility] with [facility] [phone] who will be patient's care coordinator and left voicemail.

## 2015-04-14 NOTE — Progress Notes (Signed)
D: Patient stated slept poor last night .Stated appetite is good and energy level  Is low. Stated concentration is poor . Stated on Depression scale 9 , hopeless 9 and anxiety 6 .( low 0-10 high) Denies suicidal  homicidal ideations  .  No auditory hallucinations  No pain concerns . Appropriate ADL'S. Interacting with peers and staff. Patient voice of somatic complaints , but requested no medication.Voice of goal today " Make  It to groups " " Try harder"  A: Encourage patient participation with unit programming . Instruction  Given on  Medication , verbalize understanding. R: Voice no other concerns. Staff continue to monitor
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
## 2015-04-14 NOTE — Plan of Care (Signed)
Problem: Ineffective individual coping Goal: LTG: Patient will report a decrease in negative feelings Outcome: Progressing Stated he was attending unit preprogramming .

## 2015-04-15 MED ORDER — ARIPIPRAZOLE 10 MG PO TABS
20.0000 mg | ORAL_TABLET | Freq: Every day | ORAL | Status: DC
  Administered 2015-04-15 – 2015-04-28 (×14): 20 mg via ORAL
  Filled 2015-04-15 (×16): qty 2

## 2015-04-15 NOTE — Plan of Care (Signed)
Problem: Alteration in mood Goal: LTG-Patient reports reduction in suicidal thoughts (Patient reports reduction in suicidal thoughts and is able to verbalize a safety plan for whenever patient is feeling suicidal)  Outcome: Progressing Denies suicidal ideations

## 2015-04-15 NOTE — Plan of Care (Addendum)
Problem: Ineffective individual coping Goal: STG: Patient will remain free from self harm Outcome: Progressing Medications administered as ordered by the physician, medications Therapeutic Effects, SEs and Adverse effects discussed, questions encouraged; Restoril 30 mg PRN given, 15 minute checks maintained for safety, clinical and moral support provided, patient encouraged to continue to express feelings and demonstrate safe care. Patient remain free from harm, will continue to monitor.
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
## 2015-04-16 MED ORDER — AMLODIPINE BESYLATE 10 MG PO TABS
10.0000 mg | ORAL_TABLET | Freq: Every day | ORAL | Status: DC
  Administered 2015-04-17 – 2015-04-24 (×8): 10 mg via ORAL
  Filled 2015-04-16 (×8): qty 1

## 2015-04-16 NOTE — [facility] Group Notes (Signed)
[place] Group Notes:  (Nursing/MHT/Case Management/Adjunct)  Date:  04/16/2015  Time:  6:05 PM  Type of Therapy:  Psychoeducational Skills  Participation Level:  Active  Participation Quality:  Attentive  Affect:  Appropriate  Cognitive:  Appropriate  Insight:  Appropriate  Engagement in Group:  Engaged  Modes of Intervention:  Education and Support  Summary of Progress/Problems:  Lorane Gell 04/16/2015, 6:05 PM

## 2015-04-16 NOTE — Plan of Care (Signed)
Problem: Ineffective individual coping Goal: STG: Patient will remain free from self harm Outcome: Progressing No self harm.  Goal: STG:Pt. will utilize relaxation techniques to reduce stress STG: Patient will utilize relaxation techniques to reduce stress levels  Outcome: Progressing Patient verbalizing concerns and does allow for reassurance and redirection.   Problem: Alteration in thought process Goal: LTG-Patient has not harmed self or others in at least 2 days Outcome: Progressing No self harm.  Goal: LTG-Patient is able to perceive the environment accurately Outcome: Progressing Patient able to perceive accurately.  Goal: LTG-Patient verbalizes understanding importance med regimen (Patient verbalizes understanding of importance of medication regimen and need to continue outpatient care.)  Outcome: Progressing Patient is med compliant.

## 2015-04-16 NOTE — Progress Notes (Signed)
Patient with appropriate affect, cooperative with plan of care. No SI/HI at this time. Fair adls, good adls. Social with select male peer. Remains with s/s of depression as low energy and low interest, spends free time in room in bed. Safety maintained.

## 2015-04-16 NOTE — Progress Notes (Signed)
[facility] MD Progress Note  04/16/2015 1:09 PM Jeremiah [name]  MRN:  [id number]  Subjective:  Patient was difficult to interview as he only talks about his head injury that happened earlier this year. He perseverates on all the changes and problems that the head injury has caused.  Patient complains of gradual decrease in concentration and memory problems especially for new information. Patient requires significant redirection as he will only talk about the incidents that he thinks have been back in March. He has great difficulty telling me any issues or concerns for today. He however for the last today has been telling me that he does not feel like living this way and sometimes he wants "out ".   He also said that in the evenings when he is unable to fall asleep the reason why this happened his because he goes blind, once he is able to fall back asleep then his vision returns.  Per nursing: Patient with appropriate affect, cooperative with plan of care. No SI/HI at this time. Fair adls, good adls. Social with select male peer. Remains with s/s of depression as low energy and low interest, spends free time in room in bed. Safety maintained.   Principal Problem: Major depressive disorder, recurrent episode, severe, with psychosis ([place]) Diagnosis:   Patient Active Problem List   Diagnosis Date Noted  . Major depressive disorder, recurrent episode, severe, with psychosis ([place]) [F33.3] 03/30/2015  . Chronic constipation [K59.00] 03/29/2015  . 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 Cervical pain [M54.2] 01/18/2015  . Suicide threat or attempt [F48.9] 12/14/2014  . Cognitive and behavioral changes [R41.89, F91.9] 11/22/2014  . Microscopic hematuria [R31.29] 11/22/2014  . Disorder of ejaculation [N53.19] 11/22/2014  . 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 H/O autism spectrum disorder [Z86.59] 10/20/2014  .  GAD (generalized anxiety disorder) [F41.1] 10/19/2014  . Tooth ache [K08.89] 10/19/2014  . Suicidal ideation [R45.851]   . Benign essential HTN [I10] 08/27/2014  . Decreased potassium in the blood [E87.6] 12/05/2013  . 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 Prostatitis [N41.9] 12/05/2013  . Schizophrenia ([place]) [F20.9] 12/05/2013   Total Time spent with patient: 30 minutes  Past Psychiatric History: Depression and psychosis.  Past Medical History:  Past Medical History  Diagnosis Date  . Hypertension   . Rectal fistula   . Dysuria   . Diverticulosis   . Gastroparesis   . Borderline personality disorder   . Major depression ([place])   . Schizophrenia ([place])   . Hypokalemia   . Microscopic hematuria   . External hemorrhoid   . Over weight   . Nicotine addiction   . Difficulty urinating   . Stroke ([place])   . Benign essential HTN 08/27/2014  . BP (high blood pressure) 01/16/2015  . Anxiety     Past Surgical History  Procedure Laterality Date  . Lumbar spine surgery    . Colonoscopy    . Colonoscopy with propofol N/A 10/12/2014    Procedure: COLONOSCOPY WITH PROPOFOL;  Surgeon: Hulen Luster, MD;  Location: [facility] ENDOSCOPY;  Service: Gastroenterology;  Laterality: N/A;  . Tonsillectomy    . Back surgery     Family History:  Family History  Problem Relation Age of Onset  . Stroke Mother   . Stroke Maternal Grandmother   . Diabetes Mellitus II Sister    Family Psychiatric  History: None reported. Social History:  History  Alcohol Use No  Comment: last drink was in march 2016     History  Drug Use No    Social History   Social History  . Marital Status: Single    Spouse Name: N/A  . Number of Children: N/A  . Years of Education: N/A   Social History Main Topics  . Smoking status: Former Smoker    Types: Cigarettes    Quit date: 08/19/2014  . Smokeless tobacco: None  . Alcohol Use: No     Comment: last drink was in march 2016  . Drug Use: No  . Sexual  Activity: Not Asked   Other Topics Concern  . None   Social History Narrative    Sleep: Good  Appetite:  Fair  Current Medications: Current Facility-Administered Medications  Medication Dose Route Frequency Provider Last Rate Last Dose  . acetaminophen (TYLENOL) tablet 650 mg  650 mg Oral Q6H PRN Gonzella Lex, MD   650 mg at 04/09/15 2114  . alum & mag hydroxide-simeth (MAALOX/MYLANTA) 200-200-20 MG/5ML suspension 30 mL  30 mL Oral Q4H PRN Gonzella Lex, MD      . Derrill Memo ON 04/17/2015] amLODipine (NORVASC) tablet 10 mg  10 mg Oral Daily Hildred Priest, MD      . ARIPiprazole (ABILIFY) tablet 20 mg  20 mg Oral Daily Hildred Priest, MD   20 mg at 04/16/15 0859  . ARIPiprazole SUSR 400 mg  400 mg Intramuscular Q28 days Clovis Fredrickson, MD   400 mg at 04/06/15 1429  . diphenhydrAMINE (BENADRYL) capsule 50 mg  50 mg Oral QHS Gonzella Lex, MD   50 mg at 04/15/15 2146  . docusate sodium (COLACE) capsule 200 mg  200 mg Oral BID Clovis Fredrickson, MD   200 mg at 04/16/15 0858  . doxepin (SINEQUAN) capsule 150 mg  150 mg Oral QHS Jolanta B Pucilowska, MD   150 mg at 04/15/15 2146  . haloperidol (HALDOL) tablet 5 mg  5 mg Oral QHS Clovis Fredrickson, MD   5 mg at 04/15/15 2147  . ibuprofen (ADVIL,MOTRIN) tablet 600 mg  600 mg Oral Q6H PRN Gonzella Lex, MD   600 mg at 04/04/15 1444  . magnesium hydroxide (MILK OF MAGNESIA) suspension 30 mL  30 mL Oral Daily PRN Gonzella Lex, MD      . metoprolol succinate (TOPROL-XL) 24 hr tablet 50 mg  50 mg Oral Daily Jolanta B Pucilowska, MD   50 mg at 04/16/15 1138  . polyethylene glycol (MIRALAX / GLYCOLAX) packet 17 g  17 g Oral Daily Clovis Fredrickson, MD   17 g at 04/16/15 0858  . temazepam (RESTORIL) capsule 30 mg  30 mg Oral QHS PRN Clovis Fredrickson, MD   30 mg at 04/15/15 2148  . testosterone enanthate (DELATESTRYL) injection 200 mg  200 mg Intramuscular Q28 days Clovis Fredrickson, MD   200 mg at 04/06/15  1143  . venlafaxine XR (EFFEXOR-XR) 24 hr capsule 300 mg  300 mg Oral Q breakfast Jolanta B Pucilowska, MD   300 mg at 04/16/15 [id number]    Lab Results: No results found for this or any previous visit (from the past 48 hour(s)).  Musculoskeletal: Strength & Muscle Tone: within normal limits Gait & Station: normal Patient leans: N/A  Psychiatric Specialty Exam: Review of Systems  Constitutional: Negative.   HENT: Negative.   Eyes: Negative.   Respiratory: Negative.   Cardiovascular: Negative.   Gastrointestinal: Negative.   Genitourinary: Negative.  Musculoskeletal: Negative.   Skin: Negative.   Neurological: Negative.   Endo/Heme/Allergies: Negative.   Psychiatric/Behavioral: Positive for depression and suicidal ideas. The patient has insomnia.     Blood pressure 114/78, pulse 64, temperature 98.1 F (36.7 C), temperature source Oral, resp. rate 20, height 5\' 7"  (1.702 m), weight 67.132 kg (148 lb), SpO2 98 %.Body mass index is 23.17 kg/(m^2).  General Appearance: Fairly Groomed  Engineer, water::  Fair  Speech:  Clear and Coherent  Volume:  Normal  Mood:  Depressed, Hopeless and Worthless  Affect:  Blunt  Thought Process:  Tangential  Orientation:  Full (Time, Place, and Person)  Thought Content:  Perseverates on the head injury in his memory problems  Suicidal Thoughts:  Yes.  without intent/plan  Homicidal Thoughts:  No  Memory:  Immediate;   Poor Recent;   Poor Remote;   Poor  Judgement:  Poor  Insight:  Lacking  Psychomotor Activity:  Decreased  Concentration:  Poor  Recall:  Poor  Fund of Knowledge:Poor  Language: Poor  Akathisia:  No  Handed:  Right  AIMS (if indicated):     Assets:  Communication Skills Desire for Improvement Physical Health Resilience  ADL's:  Intact  Cognition: WNL  Sleep:  Number of Hours: 7   Treatment Plan Summary: Daily contact with patient to assess and evaluate symptoms and progress in treatment and Medication management   Jeremiah [name] is a 49 year old male with a history of depression and psychosis admitting floridly psychotic and depressed in the context of medication noncompliance and severe social stressors.  1. Suicidal ideation. The patient is still suicidal. He is able to contract for safety in the hospital.  2. Mood. The patient is taking Effexor for depression and anxiety and Abilify for psychosis. Abilify was increased to 20 mg daily. He accepted Abilify maintena injection. We added Haldol 5 mg nightly for psychosis. On 11-24 patient's Abilify was changed from daily at bedtime 2 every morning and since then appears that the patient has been sleeping better  3. Anxiety. The patient reportedly was prescribed Xanax in the community. Given concerns about cognitive decline, he completed Librium taper.  4. Hypertension: On metoprolol and amlodipine. His blood pressure was elevated last night and the night before as well and therefore I will increase the Norvasc from 5 to 10 mg.  5. Constipation. It is unclear if this is delusional. Seems like constant complaint. The patient is on bowel regimen. He reports feeling less constipated with improved appetite.  He did not complain about constipation today.  6. Insomnia. The patient claims not to be able to sleep for months. He is on  restoril 30 mg and Sinequan 150. Patient is slept better last night, staff reports 7 hours  7. Urinary tract infection. The patient has a history of prostatitis he believes that he needs to be on antibiotic. Urinalysis is not suggestive of urinary tract infection, urine culture is negative. No urinary complaints   8. Metabolic syndrome. Lipid panel is not elevated. TSH normal.   9. Headaches. This is a new complaint over the weekend.the patient now claims that he's had headaches since his head injury in October 2015. This is addressed with ibuprofen.   10. ADLs. Occupational therapy consult was completed. it appears that low energy and  tiredness are major obstacles.   11. Cognitive decline. Psychological consultation completed and did not detect gross cognitive difficulties. Please see results.   12. Hypogonadism. We ordered testosterone injections.   13.  ECT. After 17 days in the hospital the patient is still severely depressed, delusional, and suicidal. He was referred to [facility] for possible ECT treatment.  14. Disposition. To be established. He is homeless and uninsured, and without any support.     Hildred Priest 04/16/2015, 1:09 PM

## 2015-04-16 NOTE — BHH Group Notes (Signed)
[facility] LCSW Group Therapy  04/16/2015 2:42 PM  Type of Therapy:  Group Therapy  Participation Level:  Active  Participation Quality:  Appropriate and Attentive  Affect:  Blunted  Cognitive:  Alert and Disorganized  Insight:  Limited  Engagement in Therapy:  Limited  Modes of Intervention:  Exploration and Socialization  Summary of Progress/Problems: Patient attended and participated in group discussion appropriately but appeared to be slightly disorganized at times. Patient introduced himself and shared that the 3 things he would bring with him if stranded on an [place] would be "a global telephone, weather radar, and food". Patient states he would focus on escaping the [place] and calling for help. Patient has insight into Bipolar disorder and talked about the manic highs and depressed lows.  Keene Breath, MSW, LCSWA 04/16/2015, 2:42 PM

## 2015-04-17 NOTE — Progress Notes (Signed)
Patient was pleasant this shift.  Patient seen in the milieu interacting with other appropriately.  Patient still expressing anxiety about health concerns.  Tried to reassure patient several times throughout the shift about the health concerns.  Patient compliant with medications.  Patient denies any SI or HI at this time.  Patient in no distress at this time.  Support and encouragement provided .  Medications given as prescribed.  q15 min checks done.  Patient receptive of information and care given and will continue to monitor

## 2015-04-17 NOTE — BHH Group Notes (Signed)
[facility] LCSW Group Therapy  04/17/2015 1:07 PM  Type of Therapy:  Group Therapy  Participation Level:  Minimal  Participation Quality:  Attentive  Affect:  Flat  Cognitive:  Delusional  Insight:  Limited  Engagement in Therapy:  Limited  Modes of Intervention:  Discussion, Education, Socialization and Support  Summary of Progress/Problems: Todays topic: Grudges  Patients will be encouraged to discuss their thoughts, feelings, and behaviors as to why one holds on to grudges and reasons why people have grudges. Patients will process the impact of grudges on their daily lives and identify thoughts and feelings related to holding grudges. Patients will identify feelings and thoughts related to what life would look like without grudges. Masyn did not participate in group. However, after group he asked what happens to patients who cannot walk or get out of bed. He asked if they are just dumped outside. When asked why he was asking, he stated "I basically have cancer, I am withering away." He does not appear to be physically sick nor abnormally thin.   [place] MSW, LCSWA  04/17/2015, 1:07 PM

## 2015-04-17 NOTE — Plan of Care (Signed)
Problem: Ineffective individual coping Goal: LTG: Patient will report a decrease in negative feelings Outcome: Progressing Patient is reporting a decrease in negative feelings

## 2015-04-17 NOTE — Progress Notes (Signed)
Patient continues to be pleasant but reports multiple physical complaints such as his wrist and neck "popping", and red legs from an unknown allergy. He was given his scheduled Benadryl and was satisfied with the treatment. Upon inspection his legs were found to be the same color as the rest of his body, with no rash, streaks, or other evidence of irritation. The patient was reassured. He continues to state anxiety and a feeling of helplessness due to unanswered questions about the state of his physical health. He denies active SI, HI, and AVH. No needs were expressed. He is active in the milieu and pleasant with his peers.

## 2015-04-17 NOTE — Progress Notes (Signed)
[facility] MD Progress Note  04/17/2015 12:25 PM Jeremiah [name]  MRN:  [id number]  Subjective:  Patient was difficult to interview as he only talks about his head injury that happened earlier this year. Patient's focus is on his head injury and multiple problems of recurred since then. Since then he discusses having visual problems, disorganized thoughts and memory problems. He indicated the accident happened in April. I did discuss with him that he is had a CT scan. Patient indicated that it would not show his injury because he is having an issue with his "hippocampus"he also indicates that the injury is affected his testosterone level.  Per nursing: Patient with appropriate affect, cooperative with plan of care. No SI/HI at this time. Fair adls, good adls. Social with select male peer. Remains with s/s of depression as low energy and low interest, spends free time in room in bed. Safety maintained.   Principal Problem: Major depressive disorder, recurrent episode, severe, with psychosis ([place]) Diagnosis:   Patient Active Problem List   Diagnosis Date Noted  . Major depressive disorder, recurrent episode, severe, with psychosis ([place]) [F33.3] 03/30/2015  . Chronic constipation [K59.00] 03/29/2015  . 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 Cervical pain [M54.2] 01/18/2015  . Suicide threat or attempt [F48.9] 12/14/2014  . Cognitive and behavioral changes [R41.89, F91.9] 11/22/2014  . Microscopic hematuria [R31.29] 11/22/2014  . Disorder of ejaculation [N53.19] 11/22/2014  . 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 H/O autism spectrum disorder [Z86.59] 10/20/2014  . GAD (generalized anxiety disorder) [F41.1] 10/19/2014  . Tooth ache [K08.89] 10/19/2014  . Suicidal ideation [R45.851]   . Benign essential HTN [I10] 08/27/2014  . Decreased potassium in the blood [E87.6] 12/05/2013  . 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 Prostatitis [N41.9] 12/05/2013  . Schizophrenia ([place]) [F20.9] 12/05/2013   Total Time spent with patient: 30 minutes  Past Psychiatric History: Depression and psychosis.  Past Medical History:  Past Medical History  Diagnosis Date  . Hypertension   . Rectal fistula   . Dysuria   . Diverticulosis   . Gastroparesis   . Borderline personality disorder   . Major depression ([place])   . Schizophrenia ([place])   . Hypokalemia   . Microscopic hematuria   . External hemorrhoid   . Over weight   . Nicotine addiction   . Difficulty urinating   . Stroke ([place])   . Benign essential HTN 08/27/2014  . BP (high blood pressure) 01/16/2015  . Anxiety     Past Surgical History  Procedure Laterality Date  . Lumbar spine surgery    . Colonoscopy    . Colonoscopy with propofol N/A 10/12/2014    Procedure: COLONOSCOPY WITH PROPOFOL;  Surgeon: Hulen Luster, MD;  Location: [facility] ENDOSCOPY;  Service: Gastroenterology;  Laterality: N/A;  . Tonsillectomy    . Back surgery     Family History:  Family History  Problem Relation Age of Onset  . Stroke Mother   . Stroke Maternal Grandmother   . Diabetes Mellitus II Sister    Family Psychiatric  History: None reported. Social History:  History  Alcohol Use No    Comment: last drink was in march 2016     History  Drug Use No    Social History   Social History  . Marital Status: Single    Spouse Name: N/A  . Number of Children: N/A  . Years of Education: N/A  Social History Main Topics  . Smoking status: Former Smoker    Types: Cigarettes    Quit date: 08/19/2014  . Smokeless tobacco: None  . Alcohol Use: No     Comment: last drink was in march 2016  . Drug Use: No  . Sexual Activity: Not Asked   Other Topics Concern  . None   Social History Narrative    Sleep: Good  Appetite:  Fair  Current Medications: Current Facility-Administered Medications  Medication Dose Route Frequency Provider Last Rate  Last Dose  . acetaminophen (TYLENOL) tablet 650 mg  650 mg Oral Q6H PRN Gonzella Lex, MD   650 mg at 04/17/15 0942  . alum & mag hydroxide-simeth (MAALOX/MYLANTA) 200-200-20 MG/5ML suspension 30 mL  30 mL Oral Q4H PRN Gonzella Lex, MD      . amLODipine (NORVASC) tablet 10 mg  10 mg Oral Daily Hildred Priest, MD   10 mg at 04/17/15 0939  . ARIPiprazole (ABILIFY) tablet 20 mg  20 mg Oral Daily Hildred Priest, MD   20 mg at 04/17/15 0939  . ARIPiprazole SUSR 400 mg  400 mg Intramuscular Q28 days Clovis Fredrickson, MD   400 mg at 04/06/15 1429  . diphenhydrAMINE (BENADRYL) capsule 50 mg  50 mg Oral QHS Gonzella Lex, MD   50 mg at 04/16/15 2148  . docusate sodium (COLACE) capsule 200 mg  200 mg Oral BID Clovis Fredrickson, MD   200 mg at 04/17/15 0939  . doxepin (SINEQUAN) capsule 150 mg  150 mg Oral QHS Jolanta B Pucilowska, MD   150 mg at 04/16/15 2148  . haloperidol (HALDOL) tablet 5 mg  5 mg Oral QHS Jolanta B Pucilowska, MD   5 mg at 04/16/15 2148  . ibuprofen (ADVIL,MOTRIN) tablet 600 mg  600 mg Oral Q6H PRN Gonzella Lex, MD   600 mg at 04/04/15 1444  . magnesium hydroxide (MILK OF MAGNESIA) suspension 30 mL  30 mL Oral Daily PRN Gonzella Lex, MD      . metoprolol succinate (TOPROL-XL) 24 hr tablet 50 mg  50 mg Oral Daily Jolanta B Pucilowska, MD   50 mg at 04/17/15 0939  . polyethylene glycol (MIRALAX / GLYCOLAX) packet 17 g  17 g Oral Daily Clovis Fredrickson, MD   17 g at 04/17/15 0939  . temazepam (RESTORIL) capsule 30 mg  30 mg Oral QHS PRN Clovis Fredrickson, MD   30 mg at 04/16/15 2151  . testosterone enanthate (DELATESTRYL) injection 200 mg  200 mg Intramuscular Q28 days Clovis Fredrickson, MD   200 mg at 04/06/15 1143  . venlafaxine XR (EFFEXOR-XR) 24 hr capsule 300 mg  300 mg Oral Q breakfast Jolanta B Pucilowska, MD   300 mg at 04/17/15 [id number]    Lab Results: No results found for this or any previous visit (from the past 48  hour(s)).  Musculoskeletal: Strength & Muscle Tone: within normal limits Gait & Station: normal Patient leans: N/A  Psychiatric Specialty Exam: Review of Systems  Constitutional: Negative.   HENT: Negative.   Eyes: Negative.   Respiratory: Negative.   Cardiovascular: Negative.   Gastrointestinal: Negative.   Genitourinary: Negative.   Musculoskeletal: Negative.   Skin: Negative.   Neurological: Negative.   Endo/Heme/Allergies: Negative.   Psychiatric/Behavioral: Positive for depression and suicidal ideas. The patient has insomnia.     Blood pressure 133/89, pulse 65, temperature 98 F (36.7 C), temperature source Oral, resp. rate 20, height 5\' 7"  (  1.702 m), weight 67.132 kg (148 lb), SpO2 98 %.Body mass index is 23.17 kg/(m^2).  General Appearance: Fairly Groomed  Engineer, water::  Fair  Speech:  Clear and Coherent  Volume:  Normal  Mood:  Okay  Affect:  Blunt  Thought Process:  Tangential  Orientation:  Full (Time, Place, and Person)  Thought Content:  Perseverates on the head injury in his memory problems  Suicidal Thoughts:  No  Homicidal Thoughts:  No  Memory:  Immediate;   Poor Recent;   Poor Remote;   Poor  Judgement:  Poor  Insight:  Lacking  Psychomotor Activity:  Decreased  Concentration:  Poor  Recall:  Poor  Fund of Knowledge:Poor  Language: Poor  Akathisia:  No  Handed:  Right  AIMS (if indicated):     Assets:  Communication Skills Desire for Improvement Physical Health Resilience  ADL's:  Intact  Cognition: WNL  Sleep:  Number of Hours: 6.75   Treatment Plan Summary: Daily contact with patient to assess and evaluate symptoms and progress in treatment and Medication management   Jeremiah [name] is a 49 year old male with a history of depression and psychosis admitting floridly psychotic and depressed in the context of medication noncompliance and severe social stressors.  1. Suicidal ideation. Denies suicidal ideation.  2. Mood. The patient is  taking Effexor for depression and anxiety and Abilify for psychosis. Abilify was increased to 20 mg daily. He accepted Abilify maintena injection. We added Haldol 5 mg nightly for psychosis. On 11-24 patient's Abilify was changed from daily at bedtime 2 every morning and since then appears that the patient has been sleeping better  3. Anxiety. The patient reportedly was prescribed Xanax in the community. Given concerns about cognitive decline, he completed Librium taper.  4. Hypertension: On metoprolol and amlodipine. His blood pressure was elevated last night and the night before as well and therefore I will increase the Norvasc from 5 to 10 mg.  5. Constipation. It is unclear if this is delusional. Seems like constant complaint. The patient is on bowel regimen. He reports feeling less constipated with improved appetite.  He did not complain about constipation today.  6. Insomnia. The patient claims not to be able to sleep for months. He is on  restoril 30 mg and Sinequan 150. Patient is slept better last night, staff reports 7 hours  7. Urinary tract infection. The patient has a history of prostatitis he believes that he needs to be on antibiotic. Urinalysis is not suggestive of urinary tract infection, urine culture is negative. No urinary complaints   8. Metabolic syndrome. Lipid panel is not elevated. TSH normal.   9. Headaches. This is a new complaint over the weekend.the patient now claims that he's had headaches since his head injury in October 2015. This is addressed with ibuprofen.   10. ADLs. Occupational therapy consult was completed. it appears that low energy and tiredness are major obstacles.   11. Cognitive decline. Psychological consultation completed and did not detect gross cognitive difficulties. Please see results.   12. Hypogonadism. We ordered testosterone injections.   13. ECT. After 17 days in the hospital the patient is still severely depressed, delusional, and  suicidal. He was referred to [facility] for possible ECT treatment.  14. Disposition. To be established. He is homeless and uninsured, and without any support.     Faith Rogue 04/17/2015, 12:25 PM

## 2015-04-17 NOTE — Plan of Care (Signed)
Problem: Ineffective individual coping Goal: STG: Patient will remain free from self harm Outcome: Progressing No self harm.  Problem: Alteration in mood; excessive anxiety as evidenced by: Goal: LTG-Patient's behavior demonstrates decreased anxiety (Patient's behavior demonstrates anxiety and he/she is utilizing learned coping skills to deal with anxiety-producing situations)  Outcome: Not Progressing Patient continues to be anxious and have multiple somatic complaints.  Goal: STG-Patient can identify triggers for anxiety Outcome: Progressing Patient reports his belief that he has multiple undiagnosed and unusual medical problems, noting his anxiety as stemming from these problems.

## 2015-04-18 MED ORDER — ACETAMINOPHEN 500 MG PO TABS
1000.0000 mg | ORAL_TABLET | Freq: Three times a day (TID) | ORAL | Status: AC | PRN
  Administered 2015-04-18 (×2): 1000 mg via ORAL
  Filled 2015-04-18 (×3): qty 2

## 2015-04-18 MED ORDER — ACETAMINOPHEN 500 MG PO TABS: 1000.0000 mg | ORAL_TABLET | Freq: Three times a day (TID) | ORAL | Status: DC | PRN

## 2015-04-18 NOTE — [facility] Group Notes (Signed)
[place] LCSW Group Therapy  04/18/2015 1:03 PM  Type of Therapy:  Group Therapy  Participation Level:  Did Not Attend  Modes of Intervention:  Discussion, Education, Socialization and Support  Summary of Progress/Problems: Pt will identify unhealthy thoughts and how they impact their emotions and behavior. Pt will be encouraged to discuss these thoughts, emotions and behaviors with the group.   [place] MSW, LCSWA  04/18/2015, 1:03 PM
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
## 2015-04-18 NOTE — Plan of Care (Signed)
Problem: Ineffective individual coping Goal: LTG: Patient will report a decrease in negative feelings Outcome: Not Progressing Pt continues to be depressed and worried about a multitude of physical symptoms such as hot legs and popping wrist joints.  Goal: STG: Pt will be able to identify effective and ineffective STG: Pt will be able to identify effective and ineffective coping patterns  Outcome: Progressing Patient working on being active in the milieu and is med compliant.  Goal: STG: Patient will remain free from self harm Outcome: Progressing No self harm.  Goal: STG:Pt. will utilize relaxation techniques to reduce stress STG: Patient will utilize relaxation techniques to reduce stress levels  Outcome: Not Progressing Patient is not able to reduce his stress levels.

## 2015-04-18 NOTE — Progress Notes (Signed)
Pt demonstrated anxiety over his health concerns. Stated that he knew "this is where people come to die". Compliant with medications. Support and encouragement provided. Pt was pleasant and receptive. Denies SI at this time. BP was 174/104 @ 2131 and 163/103@ 2228.  Pt showed no signs of distress. Charge Nurse aware.  Currently resting in bed with eyes closed. Will continue to monitor.

## 2015-04-18 NOTE — Progress Notes (Signed)
Pt has been seclusive to his room. Pt continues to exhibit med seeking behaviors. Pt c/o feeling hopeless. Pt c/o having lower back pain and requested tylenol although he stated it was not going to help. Pt's mood and affect has been a little brighter.

## 2015-04-19 NOTE — Plan of Care (Signed)
Problem: Ineffective individual coping Goal: STG: Pt will be able to identify effective and ineffective STG: Pt will be able to identify effective and ineffective coping patterns  Outcome: Progressing Patient attended groups with minimal encouragement.

## 2015-04-19 NOTE — Clinical Social Work Note (Signed)
CSW met with patient to discuss discharge options. Patient reports his depression is a 10 and mentioned stepping in front of a bus if discharged. 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 Patient signed consent for his ex-wife Jeremiah [name] [phone] and also his sister but took his sister Jeremiah [name] off of the consent. Patient is explained that he will need to apply for disability once discharged as he currently has no resources, unable to work, and no support from family.  
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
## 2015-04-19 NOTE — Progress Notes (Signed)
Recreation Therapy Notes  Date: 11.28.16 Time: 3:20 pm Location: Craft Room  Group Topic: Self-expression  Goal Area(s) Addresses:  Patient will effectively use art as a means of self-expression. Patient will recognize positive benefit of self-expression. Patient will be able to identify one emotion experienced during group session. Patient will identify use of art as a coping skill.  Behavioral Response: Attentive, Interactive  Intervention: Two Faces of Me  Activity: Patients were given a blank face worksheet and instructed to draw a line down the middle. On one side of the face, patients were instructed to draw or write how they felt when they were admitted to the hospital. On the other side of the face, patients were instructed to draw or write how they want to feel when they are d/c.  Education: LRT educated patients on different forms of self-expression.  Education Outcome: In group clarification offered  Clinical Observations/Feedback: Patient completed activity by drawing how he felt when he was admitted and how he wants to feel when he is d/c. Patient contributed to group discussion by stating what he has learned to help him effectuate positive change. Patient stated one thing he had learned was that the doctors are not always right and sometimes they need to listen to the patients. LRT redirected patient. After group, patient met with LRT and asked how other perceived him. He felt like others thought he was negative. LRT encouraged patient to think positive, say his positive affirmation statements, and go to groups. Patient agreed.  Leonette Monarch, LRT/CTRS 04/19/2015 4:47 PM

## 2015-04-19 NOTE — BHH Group Notes (Signed)
[facility] LCSW Group Therapy  04/19/2015 4:01 PM  Type of Therapy:  Group Therapy  Participation Level:  Did Not Attend   Keene Breath , MSW, LCSWA 04/19/2015, 4:01 PM

## 2015-04-19 NOTE — BHH Group Notes (Signed)
[place] Group Notes:  (Nursing/MHT/Case Management/Adjunct)  Date:  04/19/2015  Time:  2:48 AM  Type of Therapy:  Group Therapy  Participation Level:  Active  Participation Quality:  Appropriate  Affect:  Appropriate  Cognitive:  Appropriate  Insight:  Appropriate  Engagement in Group:  Engaged  Modes of Intervention:  n/a  Summary of Progress/Problems:  Marylynn Pearson 04/19/2015, 2:48 AM

## 2015-04-19 NOTE — BHH Group Notes (Signed)
[place] Group Notes:  (Nursing/MHT/Case Management/Adjunct)  Date:  04/19/2015  Time:  2:18 PM  Type of Therapy:  Psychoeducational Skills  Participation Level:  Did Not Attend    Drake Leach 04/19/2015, 2:18 PM

## 2015-04-19 NOTE — Progress Notes (Signed)
Patient cooperative with group attendance. Patient continues to be delusional in thinking at times, he stated that he had cancer. Patient also stated that he was in pain and when medication(Ibuprofen) was offered, he stated that he does not take Ibuprofen because it burns his stomach.

## 2015-04-20 NOTE — Progress Notes (Signed)
D: Pt is awake and active in the milieu this evening. Pt mood is anxious and his affect is depressed. Pt denies SI/HI and AVH at this time, and is pleasant and cooperative with staff.   A: Writer provided emotional support and administered medications as prescribed.  R: Pt went to bed following snack and evening medications.

## 2015-04-20 NOTE — Plan of Care (Signed)
Problem: Ineffective individual coping Goal: STG: Patient will remain free from self harm Outcome: Progressing Medications administered as ordered by the physician, medications Therapeutic Effects, SEs and Adverse effects discussed, questions encouraged; Restoril 30 mg (2 Capsules) PRN given for insomnia, 15 minute checks maintained for safety, clinical and moral support provided, patient encouraged to continue to express feelings and demonstrate safe care. Patient remain free from harm, will continue to monitor.

## 2015-04-20 NOTE — Progress Notes (Addendum)
Sitting up in the recliner outside room, interacting with his neighbor, stated that his condition is getting worse, "I am not getting better, I don't know when I will be okay since October .Marland KitchenMarland Kitchen"

## 2015-04-20 NOTE — Progress Notes (Signed)
Recreation Therapy Notes  Date: 11.29.16 Time: 3:00 pm Location: Craft Room  Group Topic: Goal Setting  Goal Area(s) Addresses:  Patient will write down a goal. Patient will write down at least one positive reinforcement statement. Patient will verbalize benefit of setting goals.  Behavioral Response: Did not attend  Intervention: Step By Step  Activity: Patients were given a foot worksheet and instructed to write a goal on the inside of the foot. Patients were instructed to write positive reinforcement statements on the outside of the foot to help motivate themselves to keep working on their goals.  Education: LRT educated patients on healthy ways to celebrate achieving their goals.  Education Outcome: Patient did not attend group.   Clinical Observations/Feedback: Patient did not attend group.  Leonette Monarch, LRT/CTRS 04/20/2015 4:19 PM

## 2015-04-20 NOTE — Progress Notes (Signed)
Pt withdrawn but cooperative, spends most of his time in his room. No complaints at this time. No SI/HI at this time.

## 2015-04-20 NOTE — BHH Group Notes (Signed)
[place] Group Notes:  (Nursing/MHT/Case Management/Adjunct)  Date:  04/20/2015  Time:  1:51 PM  Type of Therapy:  Psychoeducational Skills  Participation Level:  Did Not Attend   Adela Lank [facility] 04/20/2015, 1:51 PM

## 2015-04-20 NOTE — Tx Team (Signed)
Interdisciplinary Treatment Plan Update (Adult)  Date:  04/20/2015 Time Reviewed:  10:22 AM  Progress in Treatment: Attending groups: No. Participating in groups:  No. Taking medication as prescribed:  Yes. Tolerating medication:  Yes. Family/Significant othe contact made:  Yes, individual(s) contacted:  Abrham Maslowski (ex-wife) [phone] Patient understands diagnosis:  Yes. Discussing patient identified problems/goals with staff:  Yes. Medical problems stabilized or resolved:  Yes. Denies suicidal/homicidal ideation: Yes. Issues/concerns per patient self-inventory:  Yes. Other:  New problem(s) identified: No, Describe:  NA  Discharge Plan or Barriers: Pt is homeless and was last living with his sister and does not want to go to a shelter. Patient has no income, no employment, no disability, and no insurance. Patient reports that he has dementia and possibly cancer and other somatic symptoms. Patient may be delusional and is referred to a Care Coordinator for support. Patient ex-wife is supportive and explains that she has no options for housing to offer but states that patient truly has no supportive family members.  Reason for Continuation of Hospitalization: Delusions  Medication stabilization  Suicidal Ideations   Comments:Mr. Twyford seems slightly better. Reportedly he did well. He still complains of depression, anxiety. Patient reports somatic symptoms and believes he may have cancer or dementia and not long to live  Estimated length of stay: 5-7 days   New goal(s):  Review of initial/current patient goals per problem list:   1.  Goal(s): Patient will participate in aftercare plan * Met: No * Target date: at discharge * As evidenced by: Patient will participate within aftercare plan AEB aftercare provider and housing plan at discharge being identified.   2.  Goal (s): Patient will exhibit decreased depressive symptoms and suicidal ideations. * Met:  *  Target date:  at discharge * As evidenced by: Patient will utilize self rating of depression at 3 or below and demonstrate decreased signs of depression or be deemed stable for discharge by MD.  2.  Goal (s): Patient will demonstrate decreased symptoms of psychosis. * Met: No  *  Target date: at discharge * As evidenced by: Patient will not endorse signs of psychosis or be deemed stable for discharge by MD. *   Attendees: Physician:   Camie Patience, MD  11/29/201610:22 AM  Nursing:   Gardiner Barefoot, RN  11/29/201610:22 AM  Other:  Keene Breath, LCSWA 11/29/201610:22 AM  Other:  11/29/201610:22 AM  Other:  11/29/201610:22 AM  Other:   11/29/201610:22 AM   Scribe for Treatment Team:   Keene Breath, MSW, LCSWA  04/20/2015, 10:22 AM
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
## 2015-04-20 NOTE — Progress Notes (Signed)
Pt cooperative, no complaints, asked for 10 o clock medications to be given early. RN gave meds without incident. No SI/HI/ AVH stated.

## 2015-04-20 NOTE — Progress Notes (Signed)
   04/20/15 1500  Clinical Encounter Type  Visited With Patient  Visit Type Follow-up  Referral From Patient  Consult/Referral To Chaplain  Spiritual Encounters  Spiritual Needs Emotional  Stress Factors  Patient Stress Factors Exhausted;Major life changes  Met w/patient who expressed concern with life plans following hospitalization. Shared that he does not have a support group and he does not know where to go. He feels that his ability to cope is degrading. I will meet with him daily for pastoral counseling.  Chap. Ashari Llewellyn G. [place]

## 2015-04-20 NOTE — BHH Suicide Risk Assessment (Signed)
[place] INPATIENT:  Family/Significant Other Suicide Prevention Education  Suicide Prevention Education:  Education Completed; Arlie Brendel (ex-wife) [phone] has been identified by the patient as the family member/significant other with whom the patient will be residing, and identified as the person(s) who will aid the patient in the event of a mental health crisis (suicidal ideations/suicide attempt).  With written consent from the patient, the family member/significant other has been provided the following suicide prevention education, prior to the and/or following the discharge of the patient.  The suicide prevention education provided includes the following:  Suicide risk factors  Suicide prevention and interventions  National Suicide Hotline telephone number  [facility] assessment telephone number  [facility] Emergency Assistance [place] and/or Residential Mobile Crisis Unit telephone number  Request made of family/significant other to:  Remove weapons (e.g., guns, rifles, knives), all items previously/currently identified as safety concern.    Remove drugs/medications (over-the-counter, prescriptions, illicit drugs), all items previously/currently identified as a safety concern.  The family member/significant other verbalizes understanding of the suicide prevention education information provided.  The family member/significant other agrees to remove the items of safety concern listed above.  Keene Breath, MSW, LCSWA 04/20/2015, 10:34 AM

## 2015-04-21 MED ORDER — ACETAMINOPHEN 325 MG PO TABS
650.0000 mg | ORAL_TABLET | Freq: Four times a day (QID) | ORAL | Status: DC | PRN
  Administered 2015-04-22 – 2015-05-04 (×12): 650 mg via ORAL
  Filled 2015-04-21 (×12): qty 2

## 2015-04-21 NOTE — BHH Group Notes (Signed)
[place] Group Notes:  (Nursing/MHT/Case Management/Adjunct)  Date:  04/21/2015  Time:  2:23 PM  Type of Therapy:  Psychoeducational Skills  Participation Level:  Did Not Attend  Celso Amy 04/21/2015, 2:23 PM

## 2015-04-21 NOTE — BHH Group Notes (Signed)
[facility] LCSW Aftercare Discharge Planning Group Note   04/21/2015 11:13 AM  Patient did not attend.  Christa See, Clinical Social Work Intern 04/21/15

## 2015-04-21 NOTE — Progress Notes (Signed)
[facility] MD Progress Note  04/21/2015 11:47 AM Jeremiah [name].K.Franchot Mimes MRN:  [id number]  Subjective:  Patient was seen in his room . Stays withdrawn and refuses to come outsiide. Today he stated that he does not have any mental problems and that he is here for "physical problems and pointed to his back.". Patient's focus is on his head injury and multiple problems of recurred since then.Pt has  disorganized thoughts and memory problems.  Patient indicated that it would not show his injury because he is having an issue with his "hippocampus"he also indicates that the injury is affected his testosterone level.  Per nursing: Patient with appropriate affect,according  cooperative with plan of care. No SI/HI at this time. Fair adls, good adls. Social with select male peer. Remains with s/s of depression as low energy and low interest, spends free time in room in bed. Safety maintained. Constant re-direction has to be provided with his thoughts.   Principal Problem: Major depressive disorder, recurrent episode, severe, with psychosis ([place]) Diagnosis:   Patient Active Problem List   Diagnosis Date Noted  . Major depressive disorder, recurrent episode, severe, with psychosis ([place]) [F33.3] 03/30/2015  . Chronic constipation [K59.00] 03/29/2015  . 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 Cervical pain [M54.2] 01/18/2015  . Suicide threat or attempt [F48.9] 12/14/2014  . Cognitive and behavioral changes [R41.89, F91.9] 11/22/2014  . Microscopic hematuria [R31.29] 11/22/2014  . Disorder of ejaculation [N53.19] 11/22/2014  . 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 H/O autism spectrum disorder [Z86.59] 10/20/2014  . GAD (generalized anxiety disorder) [F41.1] 10/19/2014  . Tooth ache [K08.89] 10/19/2014  . Suicidal ideation [R45.851]   . Benign essential HTN [I10] 08/27/2014  . Decreased potassium in the blood [E87.6]  12/05/2013  . 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 Prostatitis [N41.9] 12/05/2013  . Schizophrenia ([place]) [F20.9] 12/05/2013   Total Time spent with patient: 30 minutes  Past Psychiatric History: Depression and psychosis.  Past Medical History:  Past Medical History  Diagnosis Date  . Hypertension   . Rectal fistula   . Dysuria   . Diverticulosis   . Gastroparesis   . Borderline personality disorder   . Major depression ([place])   . Schizophrenia ([place])   . Hypokalemia   . Microscopic hematuria   . External hemorrhoid   . Over weight   . Nicotine addiction   . Difficulty urinating   . Stroke ([place])   . Benign essential HTN 08/27/2014  . BP (high blood pressure) 01/16/2015  . Anxiety     Past Surgical History  Procedure Laterality Date  . Lumbar spine surgery    . Colonoscopy    . Colonoscopy with propofol N/A 10/12/2014    Procedure: COLONOSCOPY WITH PROPOFOL;  Surgeon: Hulen Luster, MD;  Location: [facility] ENDOSCOPY;  Service: Gastroenterology;  Laterality: N/A;  . Tonsillectomy    . Back surgery     Family History:  Family History  Problem Relation Age of Onset  . Stroke Mother   . Stroke Maternal Grandmother   . Diabetes Mellitus II Sister    Family Psychiatric  History: None reported. Social History:  History  Alcohol Use No    Comment: last drink was in march 2016     History  Drug Use No    Social History   Social History  . Marital Status: Single    Spouse Name: N/A  . Number of Children: N/A  . Years  of Education: N/A   Social History Main Topics  . Smoking status: Former Smoker    Types: Cigarettes    Quit date: 08/19/2014  . Smokeless tobacco: None  . Alcohol Use: No     Comment: last drink was in march 2016  . Drug Use: No  . Sexual Activity: Not Asked   Other Topics Concern  . None   Social History Narrative    Sleep: Good  Appetite:  Fair  Current Medications: Current Facility-Administered Medications  Medication Dose Route  Frequency Provider Last Rate Last Dose  . alum & mag hydroxide-simeth (MAALOX/MYLANTA) 200-200-20 MG/5ML suspension 30 mL  30 mL Oral Q4H PRN Gonzella Lex, MD      . amLODipine (NORVASC) tablet 10 mg  10 mg Oral Daily Hildred Priest, MD   10 mg at 04/21/15 0910  . ARIPiprazole (ABILIFY) tablet 20 mg  20 mg Oral Daily Hildred Priest, MD   20 mg at 04/21/15 0910  . ARIPiprazole SUSR 400 mg  400 mg Intramuscular Q28 days Clovis Fredrickson, MD   400 mg at 04/06/15 1429  . diphenhydrAMINE (BENADRYL) capsule 50 mg  50 mg Oral QHS Gonzella Lex, MD   50 mg at 04/20/15 2139  . docusate sodium (COLACE) capsule 200 mg  200 mg Oral BID Clovis Fredrickson, MD   200 mg at 04/21/15 0910  . doxepin (SINEQUAN) capsule 150 mg  150 mg Oral QHS Jolanta B Pucilowska, MD   150 mg at 04/20/15 2139  . haloperidol (HALDOL) tablet 5 mg  5 mg Oral QHS Clovis Fredrickson, MD   5 mg at 04/20/15 2139  . ibuprofen (ADVIL,MOTRIN) tablet 600 mg  600 mg Oral Q6H PRN Gonzella Lex, MD   600 mg at 04/04/15 1444  . magnesium hydroxide (MILK OF MAGNESIA) suspension 30 mL  30 mL Oral Daily PRN Gonzella Lex, MD      . metoprolol succinate (TOPROL-XL) 24 hr tablet 50 mg  50 mg Oral Daily Jolanta B Pucilowska, MD   50 mg at 04/21/15 0910  . polyethylene glycol (MIRALAX / GLYCOLAX) packet 17 g  17 g Oral Daily Jolanta B Pucilowska, MD   17 g at 04/21/15 0910  . temazepam (RESTORIL) capsule 30 mg  30 mg Oral QHS PRN Clovis Fredrickson, MD   30 mg at 04/20/15 2139  . testosterone enanthate (DELATESTRYL) injection 200 mg  200 mg Intramuscular Q28 days Clovis Fredrickson, MD   200 mg at 04/06/15 1143  . venlafaxine XR (EFFEXOR-XR) 24 hr capsule 300 mg  300 mg Oral Q breakfast Jolanta B Pucilowska, MD   300 mg at 04/21/15 [id number]    Lab Results: No results found for this or any previous visit (from the past 48 hour(s)).  Musculoskeletal: Strength & Muscle Tone: within normal limits Gait & Station:  normal Patient leans: N/A  Psychiatric Specialty Exam: Review of Systems  Constitutional: Negative.   HENT: Negative.   Eyes: Negative.   Respiratory: Negative.   Cardiovascular: Negative.   Gastrointestinal: Negative.   Genitourinary: Negative.   Musculoskeletal: Negative.   Skin: Negative.   Neurological: Negative.   Endo/Heme/Allergies: Negative.   Psychiatric/Behavioral: Positive for depression and suicidal ideas. The patient has insomnia.     Blood pressure 127/87, pulse 72, temperature 98.2 F (36.8 C), temperature source Oral, resp. rate 20, height 5\' 7"  (1.702 m), weight 148 lb (67.132 kg), SpO2 98 %.Body mass index is 23.17 kg/(m^2).  General Appearance: Fairly  Groomed  Engineer, water::  Fair  Speech:  Clear and Coherent  Volume:  Normal  Mood:  Okay  Affect:  Blunt  Thought Process:  Tangential  Orientation:  Full (Time, Place, and Person)  Thought Content:  Perseverates on the head injury in his memory problems. Stays withdrawn in bed. Today he stated that he is here for physical problems and that he has no mental illness.  Suicidal Thoughts:  No  Homicidal Thoughts:  No  Memory:  Immediate;   Poor Recent;   Poor Remote;   Poor  Judgement:  Poor and Impaired.  Insight:  Lacking  Psychomotor Activity:  Decreased  Concentration:  Poor  Recall:  Poor  Fund of Knowledge:Poor  Language: Poor  Akathisia:  No  Handed:  Right  AIMS (if indicated):     Assets:  Communication Skills Desire for Improvement Physical Health Resilience  ADL's:  Intact  Cognition: WNL  Sleep:  Number of Hours: 7.45   Treatment Plan Summary: Daily contact with patient to assess and evaluate symptoms and progress in treatment and Medication management will be done. Pt was assured that undersigned will look into his labs and test results and he felt glad.  Jeremiah [name] is a 49 year old male with a history of depression and psychosis admitting floridly psychotic and depressed in the context  of medication noncompliance and severe social stressors.  1. Suicidal ideation. Denies suicidal ideation and contracts for safety.  2. Mood. The patient is taking Effexor for depression and anxiety and Abilify for psychosis. Abilify was increased to 20 mg daily. He accepted Abilify maintena injection. We added Haldol 5 mg nightly for psychosis. On 11-24 patient's Abilify was changed from daily at bedtime 2 every morning and since then appears that the patient has been sleeping better  3. Anxiety. The patient reportedly was prescribed Xanax in the community. Given concerns about cognitive decline, he completed Librium taper and will do it slowly.  4. Hypertension: On metoprolol and amlodipine. His blood pressure was elevated last night and the night before as well and therefore I will increase the Norvasc from 5 to 10 mg. Watch Vitals. and continue to monitor  5. Constipation. It is unclear if this is delusional. Seems like constant complaint. The patient is on bowel regimen. He reports feeling less constipated with improved appetite.  He did not complain about constipation today.  6. Insomnia. The patient claims not to be able to sleep for months. He is on  restoril 30 mg and Sinequan 150. Patient is slept better last night, staff reports 7 hours  7. Urinary tract infection. The patient has a history of prostatitis he believes that he needs to be on antibiotic. Urinalysis is not suggestive of urinary tract infection, urine culture is negative. No urinary complaints  Will follow  8. Metabolic syndrome. Lipid panel is not elevated. TSH normal.   9. Headaches. This is a new complaint over the weekend.the patient now claims that he's had headaches since his head injury in October 2015. This is addressed with ibuprofen.   10. ADLs. Occupational therapy consult was completed. it appears that low energy and tiredness are major obstacles.   11. Cognitive decline. Psychological consultation completed  and did not detect gross cognitive difficulties. Please see results.This appears to be his baseline.   12. Hypogonadism. We ordered testosterone injections.   13. ECT. After 17 days in the hospital the patient is still severely depressed, delusional, and suicidal. He was referred to [facility]  for possible ECT treatment.  14. Disposition. To be established. He is homeless and uninsured, and without any support. ACSW will explore availability of resources in the Community.    Camie Patience K 04/21/2015, 11:47 AM

## 2015-04-21 NOTE — Progress Notes (Signed)
D: Pt is active in the milieu.  mood is anxious and his affect is depressed. Pt denies SI/HI and AVH at this time, and is pleasant and cooperative with staff.   A: Writer provided emotional support and administered medications as prescribed.  R: Pt receptive and remains safe on unit.

## 2015-04-21 NOTE — Plan of Care (Signed)
Problem: Alteration in mood; excessive anxiety as evidenced by: Goal: LTG-Patient's behavior demonstrates decreased anxiety (Patient's behavior demonstrates anxiety and he/she is utilizing learned coping skills to deal with anxiety-producing situations)  Outcome: Progressing Patient more calm

## 2015-04-21 NOTE — [facility] Group Notes (Signed)
[facility] Group Notes:  (Nursing/MHT/Case Management/Adjunct)  Date:  04/21/2015  Time:  3:06 AM  Type of Therapy:  Group Therapy  Participation Level:  Active  Participation Quality:  Appropriate  Affect:  Appropriate  Cognitive:  Appropriate  Insight:  Appropriate  Engagement in Group:  Engaged  Modes of Intervention:  Discussion  Summary of Progress/Problems: PT feels as thought the doctor is not listening to his complaints of pain. Staff reassured PT that the doctor has his best interest in mind. But, to make any staff aware if he feels as though it is getting worse.   Jenetta Downer Isom Kochan 04/21/2015, 3:06 AM

## 2015-04-21 NOTE — Progress Notes (Signed)
Recreation Therapy Notes  Date: 11.30.16 Time: 3:00 pm Location: Craft Room  Group Topic: Self-esteem  Goal Area(s) Addresses:  Patient will write at least one positive trait about self. Patient will verbalize benefit of having a healthy self-esteem.  Behavioral Response: Did not attend  Intervention: I Am  Activity: Patients were given a worksheet with the letter I on it and instructed to write as many positive traits about themselves inside the letter.  Education: LRT educated patients on ways they can increase their self-esteem.  Education Outcome: Patient did not attend group.  Clinical Observations/Feedback: Patient did not attend group.  Leonette Monarch, LRT/CTRS 04/21/2015 4:06 PM

## 2015-04-22 LAB — URINALYSIS COMPLETE WITH MICROSCOPIC (ARMC ONLY)
Bilirubin Urine: NEGATIVE
Glucose, UA: NEGATIVE mg/dL
Ketones, ur: NEGATIVE mg/dL
Leukocytes, UA: NEGATIVE
Nitrite: NEGATIVE
PH: 7 (ref 5.0–8.0)
PROTEIN: NEGATIVE mg/dL
SQUAMOUS EPITHELIAL / LPF: NONE SEEN
Specific Gravity, Urine: 1.008 (ref 1.005–1.030)
WBC UA: NONE SEEN WBC/hpf (ref 0–5)

## 2015-04-22 NOTE — [facility] Group Notes (Signed)
[facility] LCSW Group Therapy  04/22/2015 10:06 AM  Type of Therapy:  Group Therapy  Participation Level:  Did Not Attend   Keene Breath, MSW, LCSWA 04/22/2015, 10:06 AM

## 2015-04-22 NOTE — Clinical Social Work Note (Signed)
CSW met with paitent and patietn is reporting that he does not believe he has long to live and reports pain in his stomach stating he is constipated and wants lab work done on his stomach, blood work, and head as patient reports his head is hurting as well due to his report of a head injury in March. Patient continues to be paranoid and delusional and is no wait list at [facility] and refuses ACT team referral.

## 2015-04-22 NOTE — [facility] Group Notes (Signed)
[facility] Group Notes:  (Nursing/MHT/Case Management/Adjunct)  Date:  04/22/2015  Time:  2:01 PM  Type of Therapy:  Group Therapy  Participation Level:  Did Not Attend   Jeremiah [name] De'Chelle Gaylin Bulthuis 04/22/2015, 2:01 PM

## 2015-04-22 NOTE — Progress Notes (Signed)
Recreation Therapy Notes  Date: 12.01.16 Time: 3:00 pm Location: Craft Room  Group Topic: Leisure Education  Goal Area(s) Addresses:  Patient will identify activities for each letter of the alphabet. Patient will verbalize ability to integrate positive leisure into life post d/c. Patient will verbalize ability to use leisure as a Technical sales engineer.  Behavioral Response: Did not attend  Intervention: Leisure Alphabet  Activity: Patients were given a leisure alphabet worksheet and instructed to write positive leisure activities for each letter of the alphabet.  Education: LRT educated patients on what they need to participate in leisure  Education Outcome: Patient did not attend group.   Clinical Observations/Feedback: Patient did not attend group.  Leonette Monarch, LRT/CTRS 04/22/2015 3:54 PM

## 2015-04-22 NOTE — Progress Notes (Signed)
[facility] MD Progress Note  04/22/2015 9:05 AM Jeremiah [name] MRN:  [id number]  Subjective:  Patient was seen in his room . Stays withdrawn and refuses to come outsiide. Today he stated that he does not have any mental problems and that he is here for "physical problems and stated that he has symptoms of UTI and had same problems before for which he was treated." Patient's focus is on his head injury and multiple problems of recurred since then.Pt stated that he was living with his sister for 6 months and he cannot go back home with her because of her husband and that prior to that he lived with his mother until she moved t a NH and he could not afford the house which was lost.  Patient indicated that it would not show his injury because he is having an issue with his "hippocampus"he also indicates that the injury is affected his testosterone level.  Per nursing: Patient with appropriate affect,according  cooperative with plan of care. No SI/HI at this time. Fair adls, good adls. Social with select male peer. Remains with s/s of depression as low energy and low interest, spends free time in room in bed. Safety maintained. Constant re-direction has to be provided with his thoughts. Pt repeatedly stated that he has physical problems and that he will not attend any of the groups here as these are all mental patients.  Principal Problem: Major depressive disorder, recurrent episode, severe, with psychosis ([place]) Diagnosis:   Patient Active Problem List   Diagnosis Date Noted  . Major depressive disorder, recurrent episode, severe, with psychosis ([place]) [F33.3] 03/30/2015  . Chronic constipation [K59.00] 03/29/2015  . 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 Cervical pain [M54.2] 01/18/2015  . Suicide threat or attempt [F48.9] 12/14/2014  . Cognitive and behavioral changes [R41.89, F91.9] 11/22/2014  . Microscopic hematuria [R31.29] 11/22/2014  . Disorder of ejaculation [N53.19] 11/22/2014  . 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 H/O autism spectrum disorder [Z86.59] 10/20/2014  . GAD (generalized anxiety disorder) [F41.1] 10/19/2014  . Tooth ache [K08.89] 10/19/2014  . Suicidal ideation [R45.851]   . Benign essential HTN [I10] 08/27/2014  . Decreased potassium in the blood [E87.6] 12/05/2013  . 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 Prostatitis [N41.9] 12/05/2013  . Schizophrenia ([place]) [F20.9] 12/05/2013   Total Time spent with patient: 30 minutes  Past Psychiatric History: Depression and psychosis.  Past Medical History:  Past Medical History  Diagnosis Date  . Hypertension   . Rectal fistula   . Dysuria   . Diverticulosis   . Gastroparesis   . Borderline personality disorder   . Major depression ([place])   . Schizophrenia ([place])   . Hypokalemia   . Microscopic hematuria   . External hemorrhoid   . Over weight   . Nicotine addiction   . Difficulty urinating   . Stroke ([place])   . Benign essential HTN 08/27/2014  . BP (high blood pressure) 01/16/2015  . Anxiety     Past Surgical History  Procedure Laterality Date  . Lumbar spine surgery    . Colonoscopy    . Colonoscopy with propofol N/A 10/12/2014    Procedure: COLONOSCOPY WITH PROPOFOL;  Surgeon: Hulen Luster, MD;  Location: [facility] ENDOSCOPY;  Service: Gastroenterology;  Laterality: N/A;  . Tonsillectomy    . Back surgery     Family History:  Family History  Problem Relation Age of Onset  . Stroke  Mother   . Stroke Maternal Grandmother   . Diabetes Mellitus II Sister    Family Psychiatric  History: None reported. Social History:  History  Alcohol Use No    Comment: last drink was in march 2016     History  Drug Use No    Social History   Social History  . Marital Status: Single    Spouse Name: N/A  . Number of Children: N/A  . Years of Education: N/A   Social History Main Topics  . Smoking status:  Former Smoker    Types: Cigarettes    Quit date: 08/19/2014  . Smokeless tobacco: None  . Alcohol Use: No     Comment: last drink was in march 2016  . Drug Use: No  . Sexual Activity: Not Asked   Other Topics Concern  . None   Social History Narrative    Sleep: Good  Appetite:  Fair  Current Medications: Current Facility-Administered Medications  Medication Dose Route Frequency Provider Last Rate Last Dose  . acetaminophen (TYLENOL) tablet 650 mg  650 mg Oral Q6H PRN Gonzella Lex, MD   650 mg at 04/22/15 [id number]  . alum & mag hydroxide-simeth (MAALOX/MYLANTA) 200-200-20 MG/5ML suspension 30 mL  30 mL Oral Q4H PRN Gonzella Lex, MD      . amLODipine (NORVASC) tablet 10 mg  10 mg Oral Daily Hildred Priest, MD   10 mg at 04/21/15 0910  . ARIPiprazole (ABILIFY) tablet 20 mg  20 mg Oral Daily Hildred Priest, MD   20 mg at 04/21/15 0910  . ARIPiprazole SUSR 400 mg  400 mg Intramuscular Q28 days Clovis Fredrickson, MD   400 mg at 04/06/15 1429  . diphenhydrAMINE (BENADRYL) capsule 50 mg  50 mg Oral QHS Gonzella Lex, MD   50 mg at 04/21/15 2137  . docusate sodium (COLACE) capsule 200 mg  200 mg Oral BID Clovis Fredrickson, MD   200 mg at 04/21/15 0910  . doxepin (SINEQUAN) capsule 150 mg  150 mg Oral QHS Jolanta B Pucilowska, MD   150 mg at 04/21/15 2138  . haloperidol (HALDOL) tablet 5 mg  5 mg Oral QHS Clovis Fredrickson, MD   5 mg at 04/21/15 2137  . ibuprofen (ADVIL,MOTRIN) tablet 600 mg  600 mg Oral Q6H PRN Gonzella Lex, MD   600 mg at 04/04/15 1444  . magnesium hydroxide (MILK OF MAGNESIA) suspension 30 mL  30 mL Oral Daily PRN Gonzella Lex, MD      . metoprolol succinate (TOPROL-XL) 24 hr tablet 50 mg  50 mg Oral Daily Jolanta B Pucilowska, MD   50 mg at 04/21/15 0910  . polyethylene glycol (MIRALAX / GLYCOLAX) packet 17 g  17 g Oral Daily Jolanta B Pucilowska, MD   17 g at 04/21/15 0910  . temazepam (RESTORIL) capsule 30 mg  30 mg Oral QHS PRN  Clovis Fredrickson, MD   30 mg at 04/21/15 2140  . testosterone enanthate (DELATESTRYL) injection 200 mg  200 mg Intramuscular Q28 days Clovis Fredrickson, MD   200 mg at 04/06/15 1143  . venlafaxine XR (EFFEXOR-XR) 24 hr capsule 300 mg  300 mg Oral Q breakfast Jolanta B Pucilowska, MD   300 mg at 04/21/15 [id number]    Lab Results: No results found for this or any previous visit (from the past 48 hour(s)).  Musculoskeletal: Strength & Muscle Tone: within normal limits Gait & Station: normal Patient leans: N/A  Psychiatric  Specialty Exam: Review of Systems  Constitutional: Negative.   HENT: Negative.   Eyes: Negative.   Respiratory: Negative.   Cardiovascular: Negative.   Gastrointestinal: Negative.   Genitourinary: Negative.   Musculoskeletal: Negative.   Skin: Negative.   Neurological: Negative.   Endo/Heme/Allergies: Negative.   Psychiatric/Behavioral: Positive for depression and suicidal ideas. The patient has insomnia.     Blood pressure 133/86, pulse 64, temperature 97.5 F (36.4 C), temperature source Oral, resp. rate 20, height 5\' 7"  (1.702 m), weight 148 lb (67.132 kg), SpO2 98 %.Body mass index is 23.17 kg/(m^2).  General Appearance: Fairly Groomed  Engineer, water::  Fair  Speech:  Clear and Coherent  Volume:  Normal  Mood:  Okay  Affect:  Blunt  Thought Process:  Tangential and pre-occupied about physical problems  Orientation:  Full (Time, Place, and Person)  Thought Content:  Perseverates on the head injury in his memory problems. Stays withdrawn in bed. Today he stated that he is here for physical problems and that he has no mental illness.  Suicidal Thoughts:  No  Homicidal Thoughts:  No  Memory:  Immediate;   Poor Recent;   Poor Remote;   Poor  Judgement:  Poor and Impaired.  Insight:  Lacking  Psychomotor Activity:  Decreased  Concentration:  Poor  Recall:  Poor  Fund of Knowledge:Poor  Language: Poor  Akathisia:  No  Handed:  Right  AIMS (if  indicated):     Assets:  Communication Skills Desire for Improvement Physical Health Resilience  ADL's:  Intact  Cognition: WNL  Sleep:  Number of Hours: 7.45   Treatment Plan Summary: Daily contact with patient to assess and evaluate symptoms and progress in treatment and Medication management will be done. Pt was assured that undersigned will look into his labs and test results and he felt glad.  Jeremiah [name] is a 49 year old male with a history of depression and psychosis admitting floridly psychotic and depressed in the context of medication noncompliance and severe social stressors.  1. Suicidal ideation. Denies suicidal ideation and contracts for safety.  2. Mood. The patient is taking Effexor for depression and anxiety and Abilify for psychosis. Abilify was increased to 20 mg daily. He accepted Abilify maintena injection. We added Haldol 5 mg nightly for psychosis. On 11-24 patient's Abilify was changed from daily at bedtime 2 every morning and since then appears that the patient has been sleeping better  3. Anxiety. The patient reportedly was prescribed Xanax in the community. Given concerns about cognitive decline, he completed Librium taper and will do it slowly.  4. Hypertension: On metoprolol and amlodipine. His blood pressure was elevated last night and the night before as well and therefore I will increase the Norvasc from 5 to 10 mg. Watch Vitals. and continue to monitor  5. Constipation. It is unclear if this is delusional. Seems like constant complaint. The patient is on bowel regimen. He reports feeling less constipated with improved appetite.  He did not complain about constipation today.  6. Insomnia. The patient claims not to be able to sleep for months. He is on  restoril 30 mg and Sinequan 150. Patient is slept better last night, staff reports 7 hours  7. Urinary tract infection. The patient has a history of prostatitis he believes that he needs to be on antibiotic.  Urinalysis is not suggestive of urinary tract infection, urine culture is negative. No urinary complaints  Will follow. Will order UDS again and watch  8. Metabolic syndrome.  Lipid panel is not elevated. TSH normal.   9. Headaches. This is a new complaint over the weekend.the patient now claims that he's had headaches since his head injury in October 2015. This is addressed with ibuprofen.   10. ADLs. Occupational therapy consult was completed. it appears that low energy and tiredness are major obstacles.   11. Cognitive decline. Psychological consultation completed and did not detect gross cognitive difficulties. Please see results.This appears to be his baseline.   12. Hypogonadism. We ordered testosterone injections.   13. ECT. After 17 days in the hospital the patient is still severely depressed, delusional, and suicidal. He was referred to [facility] for possible ECT treatment.  14. Disposition. To be established. He is homeless and uninsured, and without any support. ACSW will explore availability of resources in the Community.    Elayna Tobler K 04/22/2015, 9:05 AM

## 2015-04-22 NOTE — Progress Notes (Signed)
D: Patient stated slept good last night .Stated appetite is good and energy level  Is normal. Stated concentration is good . Stated on Depression scale 10 , hopeless 10 and anxiety 9.( low 0-10 high) Denies suicidal  homicidal ideations  .  Continue to express new somatic complaints  . Appropriate ADL'S. Interacting with peers and staff.  A: Encourage patient participation with unit programming . Instruction  Given on  Medication , verbalize understanding. R: Voice no other concerns. Staff continue to monitor

## 2015-04-22 NOTE — Plan of Care (Signed)
Problem: Ineffective individual coping Goal: STG: Patient will remain free from self harm Outcome: Progressing Patient has remained free from harm since admission and denies SI

## 2015-04-22 NOTE — Progress Notes (Signed)
D:Patient's affect is flat. When asked how his day was, he proceded to tell me his past hx of injuries. Stated that's why he's having a bad day. Patient appears somatic. States a headache with pain level at a 10. States he'll wait to take tylenol until morning though. A: Medication was given with education. Encouragement was provided. The Dr was called about tylenol because patient stated ibuprofen does not help. R: Patient was compliant with medication. Safety maintained with 15 min checks.

## 2015-04-23 NOTE — Plan of Care (Signed)
Problem: Alteration in thought process Goal: STG-Patient is able to sleep at least 6 hours per night Outcome: Progressing Pt reportedly slept 8 hours overnight.  Problem: Alteration in mood Goal: LTG-Patient reports reduction in suicidal thoughts (Patient reports reduction in suicidal thoughts and is able to verbalize a safety plan for whenever patient is feeling suicidal)  Outcome: Progressing Pt denies SI.

## 2015-04-23 NOTE — Progress Notes (Signed)
Disheveled, poorly unkept bears, thoughts are preoccupied with somatic complaints: Pain, TBI, Constipation, Toothache, insomnia, passive suicidal ideation thoughts "how can I get to the ED, I think, I am feeling worse ...." Minimal interaction with peers; will continue to monitor for safety.

## 2015-04-23 NOTE — [facility] Group Notes (Signed)
[place] LCSW Group Therapy  04/23/2015 4:12 PM  Type of Therapy:  Group Therapy  Participation Level:  Did Not Attend  Modes of Intervention:  Discussion, Education, Socialization and Support  Summary of Progress/Problems: Feelings around Relapse. Group members discussed the meaning of relapse and shared personal stories of relapse, how it affected them and others, and how they perceived themselves during this time. Group members were encouraged to identify triggers, warning signs and coping skills used when facing the possibility of relapse. Social supports were discussed and explored in detail.   [place] MSW, LCSWA  04/23/2015, 4:12 PM

## 2015-04-23 NOTE — Progress Notes (Signed)
Recreation Therapy Notes  Date: 12.02.16 Time: 3:00 pm Location: Craft Room  Group Topic: Problem solving, Communication, Teamwork  Goal Area(s) Addresses:  Patient will effectively work with peer towards shared goal. Patient will identify skills used to make activity successful. Patient will identify benefit of using group skills effectively post d/c.  Behavioral Response: Did not attend  Intervention: [employer]  Activity: Patients were given 2 minutes to strategize on how they were going to build a free standing tower out of 15 pipe cleaners. After approximately 5 minutes of building, patients were instructed to put their dominant hand behind their back. After approximately 4 minutes, patients were instructed not to talk to each other.  Education: LRT educated patients on healthy support systems.  Education Outcome: Patient did not attend group.  Clinical Observations/Feedback: Patient did not attend group.  Leonette Monarch, LRT/CTRS 04/23/2015 4:43 PM

## 2015-04-23 NOTE — Progress Notes (Signed)
D: Derrill presented with blunted affect and dysphoric mood. He denied SI/HI. He has complained about "pressure" resulting from what he says was a traumatic brain injury in 2015. He was irritated momentarily that this writer continued to pull his meds while he was relating his concerns. He suggested that unless "something was done," he would probably not be able to leave his room to attend groups, take medicines, etc. Pt has since been cooperative in coming to the med room and other common areas. A: Meds given as ordered. Q15 safety checks maintained. Support/encouragement offered. R: Pt remains free from harm and continues with treatment. Will continue to monitor for needs/safety.

## 2015-04-23 NOTE — Tx Team (Signed)
Interdisciplinary Treatment Plan Update (Adult)  Date:  04/23/2015 Time Reviewed:  3:02 PM  Progress in Treatment: Attending groups: Yes. Participating in groups:  No. Taking medication as prescribed:  Yes. Tolerating medication:  Yes. Family/Significant othe contact made:  Yes, individual(s) contacted:  Aniello Christopoulos (ex-wife) [phone] Patient understands diagnosis:  Yes. Discussing patient identified problems/goals with staff:  Yes. Medical problems stabilized or resolved:  Yes. Denies suicidal/homicidal ideation: Yes. Issues/concerns per patient self-inventory:  Yes. Other:  New problem(s) identified: No, Describe:  NA  Discharge Plan or Barriers: Pt is homeless and was last living with his sister and does not want to go to a shelter. Patient has no income, no employment, no disability, and no insurance. Patient reports that he has dementia and possibly cancer and other somatic symptoms. Patient may be delusional and is referred to a Care Coordinator for support. Patient ex-wife is supportive and explains that she has no options for housing to offer but states that patient truly has no supportive family members.  Reason for Continuation of Hospitalization: Delusions  Medication stabilization  Suicidal Ideations   Comments:Mr. Norgaard seems slightly better. Reportedly he did well. He still complains of depression, anxiety. Patient reports somatic symptoms and believes he may have cancer or dementia and not long to live  Estimated length of stay: 5-7 days   New goal(s):  Review of initial/current patient goals per problem list:   1.  Goal(s): Patient will participate in aftercare plan * Met: No * Target date: at discharge * As evidenced by: Patient will participate within aftercare plan AEB aftercare provider and housing plan at discharge being identified.   2.  Goal (s): Patient will exhibit decreased depressive symptoms and suicidal ideations. * Met:  *  Target date:  at discharge * As evidenced by: Patient will utilize self rating of depression at 3 or below and demonstrate decreased signs of depression or be deemed stable for discharge by MD.  2.  Goal (s): Patient will demonstrate decreased symptoms of psychosis. * Met: No  *  Target date: at discharge * As evidenced by: Patient will not endorse signs of psychosis or be deemed stable for discharge by MD. *   Attendees: Physician:   Camie Patience, MD  12/2/20163:02 PM  Nursing:   Marya Landry, RN  12/2/20163:02 PM  Other:  Fransisca Connors 12/2/20163:02 PM  Other:  12/2/20163:02 PM  Other:  12/2/20163:02 PM  Other:   12/2/20163:02 PM   Scribe for Treatment Team:   Keene Breath, MSW, LCSWA  04/23/2015, 3:02 PM

## 2015-04-23 NOTE — Progress Notes (Signed)
[facility] MD Progress Note  04/23/2015 1:55 PM Jeremiah [name].K.Franchot Mimes MRN:  [id number]  Subjective:  Patient was seen in his room . Stays withdrawn and refuses to come outsiide. Today he stated that he does not have any mental problems and that he has back problems and pointed to his back. Patient's focus is on his head injury and multiple problems of recurred since then.Pt stated that he was living with his sister for 6 months and he cannot go back home with her because of her husband and that prior to that he lived with his mother until she moved t a NH and he could not afford the house which was lost.  Patient indicated that it would not show his injury because he is having an issue with his "hippocampus"he also indicates that the injury is affected his testosterone level.  Per nursing: Patient with appropriate affect,according  cooperative with plan of care. No SI/HI at this time. Fair adls, good adls. Social with select male peer. Remains with s/s of depression as low energy and low interest, spends free time in room in bed. Safety maintained. Constant re-direction has to be provided with his thoughts. Pt repeatedly stated that he has physical problems and that he will not attend any of the groups here as these are all mental patients.  Principal Problem: Major depressive disorder, recurrent episode, severe, with psychosis ([place]) Diagnosis:   Patient Active Problem List   Diagnosis Date Noted  . Major depressive disorder, recurrent episode, severe, with psychosis ([place]) [F33.3] 03/30/2015  . Chronic constipation [K59.00] 03/29/2015  . 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 Cervical pain [M54.2] 01/18/2015  . Suicide threat or attempt [F48.9] 12/14/2014  . Cognitive and behavioral changes [R41.89, F91.9] 11/22/2014  . Microscopic hematuria [R31.29] 11/22/2014  . Disorder of ejaculation [N53.19] 11/22/2014  . 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 H/O autism spectrum disorder [Z86.59] 10/20/2014  . GAD (generalized anxiety disorder) [F41.1] 10/19/2014  . Tooth ache [K08.89] 10/19/2014  . Suicidal ideation [R45.851]   . Benign essential HTN [I10] 08/27/2014  . Decreased potassium in the blood [E87.6] 12/05/2013  . 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 Prostatitis [N41.9] 12/05/2013  . Schizophrenia ([place]) [F20.9] 12/05/2013   Total Time spent with patient: 30 minutes  Past Psychiatric History: Depression and psychosis.  Past Medical History:  Past Medical History  Diagnosis Date  . Hypertension   . Rectal fistula   . Dysuria   . Diverticulosis   . Gastroparesis   . Borderline personality disorder   . Major depression ([place])   . Schizophrenia ([place])   . Hypokalemia   . Microscopic hematuria   . External hemorrhoid   . Over weight   . Nicotine addiction   . Difficulty urinating   . Stroke ([place])   . Benign essential HTN 08/27/2014  . BP (high blood pressure) 01/16/2015  . Anxiety     Past Surgical History  Procedure Laterality Date  . Lumbar spine surgery    . Colonoscopy    . Colonoscopy with propofol N/A 10/12/2014    Procedure: COLONOSCOPY WITH PROPOFOL;  Surgeon: Hulen Luster, MD;  Location: [facility] ENDOSCOPY;  Service: Gastroenterology;  Laterality: N/A;  . Tonsillectomy    . Back surgery     Family History:  Family History  Problem Relation Age of Onset  . Stroke Mother   . Stroke Maternal Grandmother   . Diabetes Mellitus II Sister  Family Psychiatric  History: None reported. Social History:  History  Alcohol Use No    Comment: last drink was in march 2016     History  Drug Use No    Social History   Social History  . Marital Status: Single    Spouse Name: N/A  . Number of Children: N/A  . Years of Education: N/A   Social History Main Topics  . Smoking status: Former Smoker    Types: Cigarettes    Quit date: 08/19/2014  . Smokeless tobacco: None   . Alcohol Use: No     Comment: last drink was in march 2016  . Drug Use: No  . Sexual Activity: Not Asked   Other Topics Concern  . None   Social History Narrative    Sleep: Good  Appetite:  Fair  Current Medications: Current Facility-Administered Medications  Medication Dose Route Frequency Provider Last Rate Last Dose  . acetaminophen (TYLENOL) tablet 650 mg  650 mg Oral Q6H PRN Gonzella Lex, MD   650 mg at 04/22/15 [id number]  . alum & mag hydroxide-simeth (MAALOX/MYLANTA) 200-200-20 MG/5ML suspension 30 mL  30 mL Oral Q4H PRN Gonzella Lex, MD      . amLODipine (NORVASC) tablet 10 mg  10 mg Oral Daily Hildred Priest, MD   10 mg at 04/23/15 0924  . ARIPiprazole (ABILIFY) tablet 20 mg  20 mg Oral Daily Hildred Priest, MD   20 mg at 04/23/15 0924  . ARIPiprazole SUSR 400 mg  400 mg Intramuscular Q28 days Clovis Fredrickson, MD   400 mg at 04/06/15 1429  . diphenhydrAMINE (BENADRYL) capsule 50 mg  50 mg Oral QHS Gonzella Lex, MD   50 mg at 04/22/15 2120  . docusate sodium (COLACE) capsule 200 mg  200 mg Oral BID Clovis Fredrickson, MD   200 mg at 04/23/15 0924  . doxepin (SINEQUAN) capsule 150 mg  150 mg Oral QHS Jolanta B Pucilowska, MD   150 mg at 04/22/15 2121  . haloperidol (HALDOL) tablet 5 mg  5 mg Oral QHS Clovis Fredrickson, MD   5 mg at 04/22/15 2121  . ibuprofen (ADVIL,MOTRIN) tablet 600 mg  600 mg Oral Q6H PRN Gonzella Lex, MD   600 mg at 04/04/15 1444  . magnesium hydroxide (MILK OF MAGNESIA) suspension 30 mL  30 mL Oral Daily PRN Gonzella Lex, MD      . metoprolol succinate (TOPROL-XL) 24 hr tablet 50 mg  50 mg Oral Daily Jolanta B Pucilowska, MD   50 mg at 04/23/15 0924  . polyethylene glycol (MIRALAX / GLYCOLAX) packet 17 g  17 g Oral Daily Clovis Fredrickson, MD   17 g at 04/23/15 0924  . temazepam (RESTORIL) capsule 30 mg  30 mg Oral QHS PRN Clovis Fredrickson, MD   30 mg at 04/22/15 2121  . testosterone enanthate (DELATESTRYL)  injection 200 mg  200 mg Intramuscular Q28 days Clovis Fredrickson, MD   200 mg at 04/06/15 1143  . venlafaxine XR (EFFEXOR-XR) 24 hr capsule 300 mg  300 mg Oral Q breakfast Jolanta B Pucilowska, MD   300 mg at 04/23/15 [id number]    Lab Results:  Results for orders placed or performed during the hospital encounter of 03/30/15 (from the past 48 hour(s))  Urinalysis complete, with microscopic ([place] only)     Status: Abnormal   Collection Time: 04/22/15 10:22 AM  Result Value Ref Range   Color, Urine YELLOW (A)  YELLOW   APPearance CLEAR (A) CLEAR   Glucose, UA NEGATIVE NEGATIVE mg/dL   Bilirubin Urine NEGATIVE NEGATIVE   Ketones, ur NEGATIVE NEGATIVE mg/dL   Specific Gravity, Urine 1.008 1.005 - 1.030   Hgb urine dipstick 1+ (A) NEGATIVE   pH 7.0 5.0 - 8.0   Protein, ur NEGATIVE NEGATIVE mg/dL   Nitrite NEGATIVE NEGATIVE   Leukocytes, UA NEGATIVE NEGATIVE   RBC / HPF 0-5 0 - 5 RBC/hpf   WBC, UA NONE SEEN 0 - 5 WBC/hpf   Bacteria, UA RARE (A) NONE SEEN   Squamous Epithelial / LPF NONE SEEN NONE SEEN   Mucous PRESENT     Musculoskeletal: Strength & Muscle Tone: within normal limits Gait & Station: normal Patient leans: N/A  Psychiatric Specialty Exam: Review of Systems  Constitutional: Negative.   HENT: Negative.   Eyes: Negative.   Respiratory: Negative.   Cardiovascular: Negative.   Gastrointestinal: Negative.   Genitourinary: Negative.   Musculoskeletal: Negative.   Skin: Negative.   Neurological: Negative.   Endo/Heme/Allergies: Negative.   Psychiatric/Behavioral: Positive for depression and suicidal ideas. The patient has insomnia.     Blood pressure 127/91, pulse 64, temperature 97.6 F (36.4 C), temperature source Oral, resp. rate 20, height 5\' 7"  (1.702 m), weight 148 lb (67.132 kg), SpO2 98 %.Body mass index is 23.17 kg/(m^2).  General Appearance: Fairly Groomed  Engineer, water::  Fair  Speech:  Clear and Coherent  Volume:  Normal  Mood:  Okay  Affect:  Blunt   Thought Process:  Tangential and pre-occupied about physical problems  Orientation:  Full (Time, Place, and Person)  Thought Content:  Perseverates on the head injury in his memory problems. Stays withdrawn in bed. Today he stated that he is here for physical problems and that he has no mental illness.  Suicidal Thoughts:  No  Homicidal Thoughts:  No  Memory:  Immediate;   Poor Recent;   Poor Remote;   Poor  Judgement:  Poor and Impaired.  Insight:  Lacking  Psychomotor Activity:  Decreased  Concentration:  Poor  Recall:  Poor  Fund of Knowledge:Poor  Language: Poor  Akathisia:  No  Handed:  Right  AIMS (if indicated):     Assets:  Communication Skills Desire for Improvement Physical Health Resilience  ADL's:  Intact  Cognition: WNL  Sleep:  Number of Hours: 8   Treatment Plan Summary: Daily contact with patient to assess and evaluate symptoms and progress in treatment and Medication management will be done. Pt was assured that undersigned will look into his labs and test results and he felt glad.  Mr. Balan is a 48 year old male with a history of depression and psychosis admitting floridly psychotic and depressed in the context of medication noncompliance and severe social stressors.  1. Suicidal ideation. Denies suicidal ideation and contracts for safety.  2. Mood. The patient is taking Effexor for depression and anxiety and Abilify for psychosis. Abilify was increased to 20 mg daily. He accepted Abilify maintena injection. We added Haldol 5 mg nightly for psychosis. On 11-24 patient's Abilify was changed from daily at bedtime 2 every morning and since then appears that the patient has been sleeping better  3. Anxiety. The patient reportedly was prescribed Xanax in the community. Given concerns about cognitive decline, he completed Librium taper and will do it slowly.  4. Hypertension: On metoprolol and amlodipine. His blood pressure was elevated last night and the night  before as well and therefore I will increase  the Norvasc from 5 to 10 mg. Watch Vitals. and continue to monitor  5. Constipation. It is unclear if this is delusional. Seems like constant complaint. The patient is on bowel regimen. He reports feeling less constipated with improved appetite.  He did not complain about constipation today.  6. Insomnia. The patient claims not to be able to sleep for months. He is on  restoril 30 mg and Sinequan 150. Patient is slept better last night, staff reports 7 hours  7. Urinary tract infection. The patient has a history of prostatitis he believes that he needs to be on antibiotic. Urinalysis is not suggestive of urinary tract infection, urine culture is negative. No urinary complaints  Will follow. Will order UDS again and watch  8. Metabolic syndrome. Lipid panel is not elevated. TSH normal.   9. Headaches. This is a new complaint over the weekend.the patient now claims that he's had headaches since his head injury in October 2015. This is addressed with ibuprofen.   10. ADLs. Occupational therapy consult was completed. it appears that low energy and tiredness are major obstacles.   11. Cognitive decline. Psychological consultation completed and did not detect gross cognitive difficulties. Please see results.This appears to be his baseline.   12. Hypogonadism. We ordered testosterone injections.   13. ECT. After 17 days in the hospital the patient is still severely depressed, delusional, and suicidal. He was referred to [facility] for possible ECT treatment.  14. Disposition. To be established. He is homeless and uninsured, and without any support. [facility] will explore availability of resources in the Community.Continue current meds and constant re-direction provided    Jacquese Hackman K 04/23/2015, 1:55 PM

## 2015-04-23 NOTE — Plan of Care (Signed)
Problem: Ineffective individual coping Goal: STG: Patient will remain free from self harm Outcome: Progressing Medications administered as ordered by the physician, medications Therapeutic Effects, SEs and Adverse effects discussed, questions encouraged; Restoril 30 mg (2 Capsules) PRN given for insomnia, 15 minute checks maintained for safety, clinical and moral support provided, patient encouraged to continue to express feelings and demonstrate safe care. Patient remain free from harm, will continue to monitor.

## 2015-04-24 ENCOUNTER — Inpatient Hospital Stay: Payer: No Typology Code available for payment source

## 2015-04-24 MED ORDER — AMLODIPINE BESYLATE 5 MG PO TABS
7.5000 mg | ORAL_TABLET | Freq: Every day | ORAL | Status: DC
  Administered 2015-04-24 – 2015-04-28 (×5): 7.5 mg via ORAL
  Filled 2015-04-24 (×5): qty 2

## 2015-04-24 NOTE — Progress Notes (Signed)
[facility] MD Progress Note  04/24/2015 5:30 PM Jeremiah [name] MRN:  [id number]  Subjective  The patient is complaining of a severe headache and pressure pain in his head. He says he is unable to hold his head up. He is also complaining of visual abnormalities but was very vague and not his headache has worsened over the past 2 days. He denies any other new somatic complaints but is concerned more about his physical health and psychiatric conditions. He has been compliant with psychotropic medications and denies any physical adverse side effects patient with those medications. He denies any auditory or visual hallucinations. He denies any paranoid thoughts or delusions. He says his mood has been depressed and he does endorse some anhedonia. He has been fairly isolative to his room but did attend some groups. He denies any current active or passive suicidal thoughts or psychotic symptoms. He has been eating well. Per nursing he slept 6-1/2 hours last night. Blood pressure was somewhat low this morning but he denies any dizziness.   Past psychiatric history. As above he carries multiple diagnoses. He was hospitalized at [facility] at least twice before. The patient is not a good historian and unable to give me any details of his medication history. She denies ever attempting suicide. Upon further questioning the patient has all his problems started in October 2015 when he suffered severe head injury that led to depression, mood instability, cognitive decline. He used to be evaluated regarding his high school but now has difficulties remembering everyday stuff. He was diagnosed with dementia praecox. He also believes that all his somatic problems constipation and urinary tract infection and insomnia targeted at the same time.  Family psychiatric history. He denies any history of any mental illness or substance use in the family.  Social history. He was married twice but now divorced. He has two children age 70  and 56. He did well in school. He used to work as a Electronics engineer. He became dysfunctional following head injury. He is homeless, uninsured, with no social support. He applied for disability but was turned down and called a Risk manager.  Medical history: He has a long-standing history of multiple medical complaints most of which are never found to be diagnosable. He is always complaining of chronic severe constipation and trouble sleeping. Possible high blood pressure for real but no other known medical problems.  Substance abuse history: Patient says he is not drinking and does not use drugs. He says he dabbled in the years ago but patient's gait completely off of them now for years  Total Time spent with patient: 20 minutes  Principal Problem: Major depressive disorder, recurrent episode, severe, with psychosis ([place]) Diagnosis:   Patient Active Problem List   Diagnosis Date Noted  . Major depressive disorder, recurrent episode, severe, with psychosis ([place]) [F33.3] 03/30/2015  . Chronic constipation [K59.00] 03/29/2015  . 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 Cervical pain [M54.2] 01/18/2015  . Suicide threat or attempt [F48.9] 12/14/2014  . Cognitive and behavioral changes [R41.89, F91.9] 11/22/2014  . Microscopic hematuria [R31.29] 11/22/2014  . Disorder of ejaculation [N53.19] 11/22/2014  . 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 H/O autism spectrum disorder [Z86.59] 10/20/2014  . GAD (generalized anxiety disorder) [F41.1] 10/19/2014  . Tooth ache [K08.89] 10/19/2014  . Suicidal ideation [R45.851]   . Benign essential HTN [I10] 08/27/2014  . Decreased potassium in the blood [E87.6] 12/05/2013  . 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 Prostatitis [N41.9] 12/05/2013  . Schizophrenia ([place]) [F20.9] 12/05/2013   Total Time spent with patient: 30 minutes  Past Psychiatric  History: Depression and psychosis.  Past Medical History:  Past Medical History  Diagnosis Date  . Hypertension   . Rectal fistula   . Dysuria   . Diverticulosis   . Gastroparesis   . Borderline personality disorder   . Major depression ([place])   . Schizophrenia ([place])   . Hypokalemia   . Microscopic hematuria   . External hemorrhoid   . Over weight   . Nicotine addiction   . Difficulty urinating   . Stroke ([place])   . Benign essential HTN 08/27/2014  . BP (high blood pressure) 01/16/2015  . Anxiety     Past Surgical History  Procedure Laterality Date  . Lumbar spine surgery    . Colonoscopy    . Colonoscopy with propofol N/A 10/12/2014    Procedure: COLONOSCOPY WITH PROPOFOL;  Surgeon: Hulen Luster, MD;  Location: [facility] ENDOSCOPY;  Service: Gastroenterology;  Laterality: N/A;  . Tonsillectomy    . Back surgery     Family History:  Family History  Problem Relation Age of Onset  . Stroke Mother   . Stroke Maternal Grandmother   . Diabetes Mellitus II Sister    Family Psychiatric  History: None reported. Social History:  History  Alcohol Use No    Comment: last drink was in march 2016     History  Drug Use No    Social History   Social History  . Marital Status: Single    Spouse Name: N/A  . Number of Children: N/A  . Years of Education: N/A   Social History Main Topics  . Smoking status: Former Smoker    Types: Cigarettes    Quit date: 08/19/2014  . Smokeless tobacco: None  . Alcohol Use: No     Comment: last drink was in march 2016  . Drug Use: No  . Sexual Activity: Not Asked   Other Topics Concern  . None   Social History Narrative    Sleep: Good  Appetite:  Fair  Current Medications: Current Facility-Administered Medications  Medication Dose Route Frequency Provider Last Rate Last Dose  . acetaminophen (TYLENOL) tablet 650 mg  650 mg Oral Q6H PRN Gonzella Lex, MD   650 mg at 04/23/15 1948  . alum & mag hydroxide-simeth (MAALOX/MYLANTA)  200-200-20 MG/5ML suspension 30 mL  30 mL Oral Q4H PRN Gonzella Lex, MD      . amLODipine (NORVASC) tablet 10 mg  10 mg Oral Daily Hildred Priest, MD   10 mg at 04/24/15 0849  . ARIPiprazole (ABILIFY) tablet 20 mg  20 mg Oral Daily Hildred Priest, MD   20 mg at 04/24/15 0849  . ARIPiprazole SUSR 400 mg  400 mg Intramuscular Q28 days Clovis Fredrickson, MD   400 mg at 04/06/15 1429  . diphenhydrAMINE (BENADRYL) capsule 50 mg  50 mg Oral QHS Gonzella Lex, MD   50 mg at 04/23/15 2203  . docusate sodium (COLACE) capsule 200 mg  200 mg Oral BID Clovis Fredrickson, MD   200 mg at 04/24/15 0849  . doxepin (SINEQUAN) capsule 150 mg  150 mg Oral QHS Jolanta B Pucilowska, MD   150 mg at 04/23/15 2203  . haloperidol (HALDOL) tablet 5 mg  5 mg Oral QHS Clovis Fredrickson, MD   5 mg at 04/23/15 2203  . ibuprofen (ADVIL,MOTRIN)  tablet 600 mg  600 mg Oral Q6H PRN Gonzella Lex, MD   600 mg at 04/04/15 1444  . magnesium hydroxide (MILK OF MAGNESIA) suspension 30 mL  30 mL Oral Daily PRN Gonzella Lex, MD      . metoprolol succinate (TOPROL-XL) 24 hr tablet 50 mg  50 mg Oral Daily Jolanta B Pucilowska, MD   50 mg at 04/24/15 0849  . polyethylene glycol (MIRALAX / GLYCOLAX) packet 17 g  17 g Oral Daily Jolanta B Pucilowska, MD   17 g at 04/24/15 0849  . temazepam (RESTORIL) capsule 30 mg  30 mg Oral QHS PRN Clovis Fredrickson, MD   30 mg at 04/23/15 2207  . testosterone enanthate (DELATESTRYL) injection 200 mg  200 mg Intramuscular Q28 days Clovis Fredrickson, MD   200 mg at 04/06/15 1143  . venlafaxine XR (EFFEXOR-XR) 24 hr capsule 300 mg  300 mg Oral Q breakfast Jolanta B Pucilowska, MD   300 mg at 04/24/15 0848    Lab Results:  No results found for this or any previous visit (from the past 48 hour(s)).  Musculoskeletal: Strength & Muscle Tone: within normal limits Gait & Station: normal Patient leans: N/A  Psychiatric Specialty Exam: Review of Systems   Constitutional: Negative.  Negative for fever, chills, weight loss, malaise/fatigue and diaphoresis.  HENT: Negative for congestion, ear pain, hearing loss, nosebleeds, sore throat and tinnitus.   Eyes: Positive for blurred vision, photophobia and pain. Negative for double vision and redness.       The patient complains of a worsening headache over the past 2 days.  Respiratory: Negative.  Negative for cough, hemoptysis, sputum production, shortness of breath and wheezing.   Cardiovascular: Negative.  Negative for chest pain, palpitations, orthopnea, claudication and leg swelling.  Gastrointestinal: Negative.  Negative for heartburn, nausea, vomiting, abdominal pain, diarrhea, constipation and blood in stool.  Genitourinary: Negative.  Negative for dysuria, urgency, frequency and hematuria.  Musculoskeletal: Negative.  Negative for myalgias, back pain, joint pain, falls and neck pain.  Skin: Negative.  Negative for itching and rash.  Neurological: Positive for headaches. Negative for dizziness, tingling, tremors, sensory change, speech change, focal weakness and seizures.  Endo/Heme/Allergies: Negative.  Does not bruise/bleed easily.  Psychiatric/Behavioral: Positive for depression and suicidal ideas. The patient has insomnia.     Blood pressure 108/76, pulse 80, temperature 97.5 F (36.4 C), temperature source Oral, resp. rate 20, height 5\' 7"  (1.702 m), weight 67.132 kg (148 lb), SpO2 98 %.Body mass index is 23.17 kg/(m^2).  General Appearance: Fairly Groomed  Engineer, water::  Poor - The patient would sit with his head down  Speech:  Clear and Coherent  Volume:  Normal  Mood:  Okay  Affect:  Blunt  Thought Process:  Tangential and pre-occupied about physical problems  Orientation:  Full (Time, Place, and Person)  Thought Content:  Perseverates on the head injury in his memory problems. Stays withdrawn in bed. Today he stated that he is here for physical problems and that he has no mental  illness.  Suicidal Thoughts:  No  Homicidal Thoughts:  No  Memory:  Immediate;   Poor Recent;   Poor Remote;   Poor  Judgement:  Poor and Impaired.  Insight:  Lacking  Psychomotor Activity:  Decreased  Concentration:  Poor  Recall:  Poor  Fund of Knowledge:Poor  Language: Poor  Akathisia:  No  Handed:  Right  AIMS (if indicated):     Assets:  Communication Skills  Desire for Improvement Physical Health Resilience  ADL's:  Intact  Cognition: WNL  Sleep:  Number of Hours: 6.5   Treatment Plan Summary: Daily contact with patient to assess and evaluate symptoms and progress in treatment and Medication management will be done. Pt was assured that undersigned will look into his labs and test results and he felt glad.  Mr. Supino is a 49 year old male with a history of depression and psychosis admitting floridly psychotic and depressed in the context of medication noncompliance and severe social stressors.  1. Suicidal ideation. Denies suicidal ideation and contracts for safety.  2. Mood. The patient is taking Effexor Xr 150mg  po daily for depression and anxiety and Abilify for psychosis. Abilify was increased to 20 mg daily. He accepted Abilify maintena injection of 400mg  that will be given monthly.. We added Haldol 5 mg nightly for psychosis. On 11-24 patient's Abilify was changed from daily at bedtime to every morning and since then appears that the patient has been sleeping better  3. Anxiety. The patient reportedly was prescribed Xanax in the community. Given concerns about cognitive decline, he completed Librium taper and will do it slowly.  4. Hypertension: On metoprolol and amlodipine. His blood pressure was low this morning and will decrease Norvasc to 7.5mg  po daily. Watch Vitals. and continue to monitor  5. Constipation. It is unclear if this is delusional. Seems like constant complaint. The patient is on bowel regimen. He reports feeling less constipated with improved  appetite.  He did not complain about constipation today.  6. Insomnia. The patient claims not to be able to sleep for months. He is on  restoril 30 mg and Sinequan 150. Patient is slept better last night, staff reports 6.5 hours last night.  7. Urinary tract infection. The patient has a history of prostatitis he believes that he needs to be on antibiotic. Urinalysis is not suggestive of urinary tract infection, urine culture is negative. No urinary complaints  Will follow. Repeat UA was negative.   8. Metabolic syndrome. Lipid panel is not elevated. TSH normal.   9. Headaches. This is a new complaint over the weekend.the patient now claims that he's had headaches since his head injury in October 2015. This is addressed with ibuprofen. Will also check CT Head given prior TBI.  10. ADLs. Occupational therapy consult was completed. it appears that low energy and tiredness are major obstacles.   11. Cognitive decline. Psychological consultation completed and did not detect gross cognitive difficulties. Please see results.This appears to be his baseline.   12. Hypogonadism. We ordered testosterone injections.   13. ECT. After 17 days in the hospital the patient is still severely depressed, delusional, and suicidal. He was referred to [facility] for possible ECT treatment.  14. Disposition. To be established. He is homeless and uninsured, and without any support. ACSW will explore availability of resources in the Community.Continue current meds and constant re-direction provided    Jeremiah [name] KAMAL 04/24/2015, 5:30 PM

## 2015-04-24 NOTE — Progress Notes (Signed)
D: Pt rates depression at 10 on scale 10 , hopeless 10 and anxiety 9.( low 0-10 high) Denies suicidal homicidal ideations . Continue to express new somatic complaints. Appropriate ADL'S. Interacting with peers and staff.  A: Encourage patient participation with unit programming . Instruction Given on Medication , verbalize understanding. R: Voice no other concerns. Staff continue to monitor, remains safe on unit

## 2015-04-24 NOTE — [facility] Group Notes (Signed)
[place] Group Notes:  (Nursing/MHT/Case Management/Adjunct)  Date:  04/24/2015  Time:  3:28 PM  Type of Therapy:  Psychoeducational Skills  Participation Level:  Did Not Attend   Adela Lank [facility] 04/24/2015, 3:28 PM

## 2015-04-24 NOTE — [facility] Group Notes (Signed)
[place] LCSW Group Therapy  04/24/2015 3:01 PM  Type of Therapy:  Group Therapy  Participation Level:  Did Not Attend  Modes of Intervention:  Discussion, Education, Socialization and Support  Summary of Progress/Problems: Todays topic: Grudges  Patients will be encouraged to discuss their thoughts, feelings, and behaviors as to why one holds on to grudges and reasons why people have grudges. Patients will process the impact of grudges on their daily lives and identify thoughts and feelings related to holding grudges. Patients will identify feelings and thoughts related to what life would look like without grudges.    [place] MSW, [place]  04/24/2015, 3:01 PM

## 2015-04-24 NOTE — Plan of Care (Signed)
Problem: Ineffective individual coping Goal: STG: Patient will remain free from self harm Outcome: Progressing No self harm reported or observed     

## 2015-04-24 NOTE — Plan of Care (Signed)
Problem: Ineffective individual coping Goal: STG: Patient will remain free from self harm Outcome: Progressing No self-harm.      

## 2015-04-25 LAB — BASIC METABOLIC PANEL
Anion gap: 8 (ref 5–15)
BUN: 12 mg/dL (ref 6–20)
CHLORIDE: 98 mmol/L — AB (ref 101–111)
CO2: 28 mmol/L (ref 22–32)
CREATININE: 0.75 mg/dL (ref 0.61–1.24)
Calcium: 9.6 mg/dL (ref 8.9–10.3)
GFR calc Af Amer: >60 mL/min (ref >60)
GFR calc non Af Amer: >60 mL/min (ref >60)
Glucose, Bld: 108 mg/dL — ABNORMAL HIGH (ref 65–99)
Potassium: 4.4 mmol/L (ref 3.5–5.1)
Sodium: 134 mmol/L — ABNORMAL LOW (ref 135–145)

## 2015-04-25 NOTE — Plan of Care (Signed)
Problem: Alteration in thought process Goal: STG-Patient is able to follow short directions Patient can follow commands such as, when called to the med room he comes.

## 2015-04-25 NOTE — Progress Notes (Signed)
D: Affect flat. CT previously done at beginning of day. CT came back clean. After telling patient the results he stated, "well that's not good news." Patient also stated, "that means my brain didn't grow back right." Continues very somatic. C/O headache. Stated pain level at 9. He stated, "it's what's keeping me from making any phone calls." Patient was seen making phone calls about 15 mins after interaction. Denies SI/HI/AVH. A: Medication was given with education. Encouragement was provided. Patient seemed more anxious after finding out CT results so deep breathing was encouraged. R: Patient was cooperative with medication. Safety maintained with 15 min checks.

## 2015-04-25 NOTE — Plan of Care (Signed)
Problem: Alteration in mood; excessive anxiety as evidenced by: Goal: STG-Pt will report an absence of self-harm thoughts/actions (Patient will report an absence of self-harm thoughts or actions)  Outcome: Progressing Denies self harm thoughts.

## 2015-04-25 NOTE — Progress Notes (Signed)
Stated that he is depressed because of his head injury.Dneies suicidal ideation and A/V hallucination.Cooperative on approach.Compliant with medications.Appetite good.

## 2015-04-25 NOTE — Progress Notes (Signed)
D: Pt denies SI/HI/AVH. Pt is pleasant and cooperative, affect is bright, she appears less anxious and is interacting with peers and staff appropriately.  A: Pt was offered support and encouragement. Pt was given scheduled medications. Pt was encouraged to attend groups. Q 15 minute checks were done for safety.  R:Pt attends groups and interacts well with peers and staff. Pt is taking medication. Pt has no complaints.Pt receptive to treatment and safety maintained on unit.

## 2015-04-25 NOTE — Progress Notes (Addendum)
[facility] MD Progress Note  04/25/2015 2:14 PM Jeremiah [name] MRN:  [id number]  Subjective The patient appears delusional and continues to believe that he is terminal despite reassurance from this writer and medical tests. CT of the head was negative but the patient continues to believe that his brain did not go back after the TBI. He also continues to report this to bowels do not work because they have "near or passageways". Insight into her delusions are poor. The patient denies any current active or passive suicidal thoughts he is going to die secondary to terminal illness. He denies any current auditory or visual hallucinations. Thought processes are paranoid in nature. He denies any problems with insomnia or difficulty with his appetite. He has been isolated to his room and is not necessarily attending groups or interacting with peers frequently. He has been tolerating psychotropic medications without any overt physical side effects  Although it has not been reported by staff, the patient hinted that he may be drinking lots of water. 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  Past psychiatric history. As above he carries multiple diagnoses. He was hospitalized at [facility] at least twice before. The patient is not a good historian and unable to give me any details of his medication history. She denies ever attempting suicide. Upon further questioning the patient has all his problems started in October 2015 when he suffered severe head injury that led to depression, mood instability, cognitive decline. He used to be evaluated regarding his high school but now has difficulties remembering everyday stuff. He was diagnosed with dementia praecox. He also believes that all his somatic problems constipation and urinary tract infection and insomnia targeted at the same time.  Family psychiatric history. He denies any history of any mental illness or substance use in the family.  Social  history. He was married twice but now divorced. He has two children age 65 and 54. He did well in school. He used to work as a Electronics engineer. He became dysfunctional following head injury. He is homeless, uninsured, with no social support. He applied for disability but was turned down and called a Risk manager.  Medical history: He has a long-standing history of multiple medical complaints most of which are never found to be diagnosable. He is always complaining of chronic severe constipation and trouble sleeping. Possible high blood pressure for real but no other known medical problems.  Substance abuse history: Patient says he is not drinking and does not use drugs. He says he dabbled in the years ago but patient's gait completely off of them now for years  Total Time spent with patient: 20 minutes  Principal Problem: Major depressive disorder, recurrent episode, severe, with psychosis ([place]) Diagnosis:   Patient Active Problem List   Diagnosis Date Noted  . Major depressive disorder, recurrent episode, severe, with psychosis ([place]) [F33.3] 03/30/2015  . Chronic constipation [K59.00] 03/29/2015  . 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 Cervical pain [M54.2] 01/18/2015  . Suicide threat or attempt [F48.9] 12/14/2014  . Cognitive and behavioral changes [R41.89, F91.9] 11/22/2014  . Microscopic hematuria [R31.29] 11/22/2014  . Disorder of ejaculation [N53.19] 11/22/2014  . 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 H/O autism spectrum disorder [Z86.59] 10/20/2014  . GAD (generalized anxiety disorder) [F41.1] 10/19/2014  . Tooth ache [K08.89] 10/19/2014  . Suicidal ideation [R45.851]   . Benign essential HTN [I10] 08/27/2014  .  Decreased potassium in the blood [E87.6] 12/05/2013  . 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 Prostatitis [N41.9] 12/05/2013  . Schizophrenia ([place]) [F20.9]  12/05/2013   Total Time spent with patient: 30 minutes  Past Psychiatric History: Depression and psychosis.  Past Medical History:  Past Medical History  Diagnosis Date  . Hypertension   . Rectal fistula   . Dysuria   . Diverticulosis   . Gastroparesis   . Borderline personality disorder   . Major depression ([place])   . Schizophrenia ([place])   . Hypokalemia   . Microscopic hematuria   . External hemorrhoid   . Over weight   . Nicotine addiction   . Difficulty urinating   . Stroke ([place])   . Benign essential HTN 08/27/2014  . BP (high blood pressure) 01/16/2015  . Anxiety     Past Surgical History  Procedure Laterality Date  . Lumbar spine surgery    . Colonoscopy    . Colonoscopy with propofol N/A 10/12/2014    Procedure: COLONOSCOPY WITH PROPOFOL;  Surgeon: Hulen Luster, MD;  Location: [facility] ENDOSCOPY;  Service: Gastroenterology;  Laterality: N/A;  . Tonsillectomy    . Back surgery     Family History:  Family History  Problem Relation Age of Onset  . Stroke Mother   . Stroke Maternal Grandmother   . Diabetes Mellitus II Sister    Family Psychiatric  History: None reported. Social History:  History  Alcohol Use No    Comment: last drink was in march 2016     History  Drug Use No    Social History   Social History  . Marital Status: Single    Spouse Name: N/A  . Number of Children: N/A  . Years of Education: N/A   Social History Main Topics  . Smoking status: Former Smoker    Types: Cigarettes    Quit date: 08/19/2014  . Smokeless tobacco: None  . Alcohol Use: No     Comment: last drink was in march 2016  . Drug Use: No  . Sexual Activity: Not Asked   Other Topics Concern  . None   Social History Narrative    Sleep: Good  Appetite:  Fair  Current Medications: Current Facility-Administered Medications  Medication Dose Route Frequency Provider Last Rate Last Dose  . acetaminophen (TYLENOL) tablet 650 mg  650 mg Oral Q6H PRN Gonzella Lex, MD   650  mg at 04/24/15 1833  . alum & mag hydroxide-simeth (MAALOX/MYLANTA) 200-200-20 MG/5ML suspension 30 mL  30 mL Oral Q4H PRN Gonzella Lex, MD      . amLODipine (NORVASC) tablet 7.5 mg  7.5 mg Oral Daily Chauncey Mann, MD   7.5 mg at 04/25/15 0926  . ARIPiprazole (ABILIFY) tablet 20 mg  20 mg Oral Daily Hildred Priest, MD   20 mg at 04/25/15 0908  . ARIPiprazole SUSR 400 mg  400 mg Intramuscular Q28 days Clovis Fredrickson, MD   400 mg at 04/06/15 1429  . diphenhydrAMINE (BENADRYL) capsule 50 mg  50 mg Oral QHS Gonzella Lex, MD   50 mg at 04/24/15 2111  . docusate sodium (COLACE) capsule 200 mg  200 mg Oral BID Clovis Fredrickson, MD   200 mg at 04/25/15 0908  . doxepin (SINEQUAN) capsule 150 mg  150 mg Oral QHS Jolanta B Pucilowska, MD   150 mg at 04/24/15 2112  . haloperidol (HALDOL) tablet 5 mg  5 mg Oral QHS Clovis Fredrickson, MD  5 mg at 04/24/15 2112  . ibuprofen (ADVIL,MOTRIN) tablet 600 mg  600 mg Oral Q6H PRN Gonzella Lex, MD   600 mg at 04/04/15 1444  . magnesium hydroxide (MILK OF MAGNESIA) suspension 30 mL  30 mL Oral Daily PRN Gonzella Lex, MD      . metoprolol succinate (TOPROL-XL) 24 hr tablet 50 mg  50 mg Oral Daily Jolanta B Pucilowska, MD   50 mg at 04/25/15 0925  . polyethylene glycol (MIRALAX / GLYCOLAX) packet 17 g  17 g Oral Daily Clovis Fredrickson, MD   17 g at 04/25/15 0925  . temazepam (RESTORIL) capsule 30 mg  30 mg Oral QHS PRN Clovis Fredrickson, MD   30 mg at 04/24/15 2114  . testosterone enanthate (DELATESTRYL) injection 200 mg  200 mg Intramuscular Q28 days Clovis Fredrickson, MD   200 mg at 04/06/15 1143  . venlafaxine XR (EFFEXOR-XR) 24 hr capsule 300 mg  300 mg Oral Q breakfast Jolanta B Pucilowska, MD   300 mg at 04/25/15 [id number]    Lab Results:  No results found for this or any previous visit (from the past 48 hour(s)).  Musculoskeletal: Strength & Muscle Tone: within normal limits Gait & Station: normal Patient leans:  N/A  Psychiatric Specialty Exam: Review of Systems  Constitutional: Negative.  Negative for fever, chills, weight loss, malaise/fatigue and diaphoresis.  HENT: Negative for congestion, ear pain, hearing loss, nosebleeds, sore throat and tinnitus.   Eyes: Positive for blurred vision, photophobia and pain. Negative for double vision and redness.       The patient complains of a worsening headache over the past 2 days.  Respiratory: Negative.  Negative for cough, hemoptysis, sputum production, shortness of breath and wheezing.   Cardiovascular: Negative.  Negative for chest pain, palpitations, orthopnea, claudication and leg swelling.  Gastrointestinal: Negative for heartburn, nausea, vomiting, abdominal pain, diarrhea, constipation and blood in stool.       The patient says his bowels do not work although he denies any diarrhea or constipation. He also denies any nausea, vomiting or abdominal pain.  Genitourinary: Negative.  Negative for dysuria, urgency, frequency and hematuria.  Musculoskeletal: Negative.  Negative for myalgias, back pain, joint pain, falls and neck pain.  Skin: Negative.  Negative for itching and rash.  Neurological: Positive for headaches. Negative for dizziness, tingling, tremors, sensory change, speech change, focal weakness and seizures.  Endo/Heme/Allergies: Negative.  Does not bruise/bleed easily.  Psychiatric/Behavioral: Positive for depression and suicidal ideas. The patient has insomnia.     Blood pressure 112/73, pulse 67, temperature 97.5 F (36.4 C), temperature source Oral, resp. rate 20, height 5\' 7"  (1.702 m), weight 67.132 kg (148 lb), SpO2 98 %.Body mass index is 23.17 kg/(m^2).  General Appearance: Fairly Groomed  Engineer, water::  Improved today and head was up  Speech:  Clear and Coherent  Volume:  Normal  Mood:  Okay  Affect:  Blunt  Thought Process:  Tangential and pre-occupied about physical problems  Orientation:  Full (Time, Place, and Person)   Thought Content:  Perseverates on the head injury in his memory problems. Stays withdrawn in bed. Today he stated that he is here for physical problems and that he has no mental illness.  Suicidal Thoughts:  No  Homicidal Thoughts:  No  Memory:  Immediate;   Poor Recent;   Poor Remote;   Poor  Judgement:  Poor and Impaired.  Insight:  Lacking  Psychomotor Activity:  Decreased  Concentration:  Poor  Recall:  Poor  Fund of Knowledge:Poor  Language: Poor  Akathisia:  No  Handed:  Right  AIMS (if indicated):     Assets:  Communication Skills Desire for Improvement Physical Health Resilience  ADL's:  Intact  Cognition: WNL  Sleep:  Number of Hours: 6.5   Treatment Plan Summary: Daily contact with patient to assess and evaluate symptoms and progress in treatment and Medication management will be done. Pt was assured that undersigned will look into his labs and test results and he felt glad.  Jeremiah [name] is a 49 year old male with a history of depression and psychosis admitting floridly psychotic and depressed in the context of medication noncompliance and severe social stressors.  1. Suicidal ideation. Denies suicidal ideation and contracts for safety.  2. Mood. The patient is taking Effexor Xr 150mg  po daily for depression and anxiety and Abilify for psychosis. Abilify was increased to 20 mg daily as an adjunct. He accepted Abilify maintena injection of 400mg  that will be given monthly.. We added Haldol 5 mg nightly for psychosis. On 11-24 patient's Abilify was changed from daily at bedtime to every morning and since then appears that the patient has been sleeping better  3. Anxiety. The patient reportedly was prescribed Xanax in the community. Given concerns about cognitive decline, he completed Librium taper and will do it slowly. Will not give benzos on a scheduled basis.  4. R/O Hyponatermia: The patient does have a history of hyponatremia and hinted that he may be drinking water  excessively. Will check BMP.  5. Hypertension: On metoprolol and amlodipine. His blood pressure was low this morning and will decrease Norvasc to 7.5mg  po daily. Watch Vitals. and continue to monitor  6. Constipation. It is unclear if this is delusional. Seems like constant complaint. The patient is on bowel regimen. He reports feeling less constipated with improved appetite.  He did not complain about constipation today.  7. Insomnia. The patient claims not to be able to sleep for months. He is on  restoril 30 mg and Sinequan 150. Patient is slept better last night, staff reports 6.5 hours last night.  8. Urinary tract infection. The patient has a history of prostatitis he believes that he needs to be on antibiotic. Urinalysis is not suggestive of urinary tract infection, urine culture is negative. No urinary complaints  Will follow. Repeat UA was negative.   9. Metabolic syndrome. Lipid panel is not elevated. TSH normal.   10. Headaches. This is a new complaint over the weekend.the patient now claims that he's had headaches since his head injury in October 2015. This is addressed with ibuprofen.CT Head was negative but patient does not accept results.  11. ADLs. Occupational therapy consult was completed. it appears that low energy and tiredness are major obstacles.   12. Cognitive decline. Psychological consultation completed and did not detect gross cognitive difficulties. Please see results.This appears to be his baseline.   13. Hypogonadism. We ordered testosterone injections.   14. ECT. After 17 days in the hospital the patient is still severely depressed, delusional, and suicidal. He was referred to [facility] for possible ECT treatment.  15. Disposition. To be established. He is homeless and uninsured, and without any support. ACSW will explore availability of resources in the Community.Continue current meds and constant re-direction provided    Jeremiah [name],Jeremiah [name] 04/25/2015, 2:14 PM

## 2015-04-25 NOTE — [facility] Group Notes (Signed)
[place] LCSW Group Therapy  04/25/2015 12:57 PM  Type of Therapy:  Group Therapy  Participation Level:  Did Not Attend  Modes of Intervention:  Discussion, Education, Socialization and Support  Summary of Progress/Problems: Pt will identify unhealthy thoughts and how they impact their emotions and behavior. Pt will be encouraged to discuss these thoughts, emotions and behaviors with the group.   [place] MSW, [place]  04/25/2015, 12:57 PM

## 2015-04-26 NOTE — Progress Notes (Signed)
Recreation Therapy Notes  Date: 12.05.16 Time: 3:00 pm Location: Craft Room  Group Topic: Self-expression  Goal Area(s) Addresses:  Patient will be able to identify a color that represents each emotion. Patient will verbalize benefit of using art as a means of self-expression. Patient will verbalize one positive emotion experienced while participating in activity.  Behavioral Response: Did not attend  Intervention: The Colors Within Me  Activity: Patients were given a blank face worksheet and instructed to analyze the emotions they are experiencing, pick a color for each emotion, and show on the worksheet how much of that emotion they are experiencing.  Education: LRT educated patients on different forms of self-expression.  Education Outcome: Patient did not attend group.   Clinical Observations/Feedback: Patient did not attend group.  Leonette Monarch, LRT/CTRS 04/26/2015 4:48 PM

## 2015-04-26 NOTE — [facility] Group Notes (Signed)
[place] Group Notes:  (Nursing/MHT/Case Management/Adjunct)  Date:  04/26/2015  Time:  1:37 PM  Type of Therapy:  Psychoeducational Skills  Participation Level:  Did Not Attend  Participation Quality:Summary of Progress/Problems:  Jeremiah [name] 04/26/2015, 1:37 PM

## 2015-04-26 NOTE — Progress Notes (Signed)
Stated that he slept good last night.Stayed in bed most of the time.Stated that he is still depressed but denies suicidal ideation.Did not attended groups.Compliant with medicines.Appetite fair.

## 2015-04-26 NOTE — Progress Notes (Signed)
[facility] MD Progress Note  04/26/2015 3:28 PM Jeremiah [name].K.Jeremiah [name] MRN:  [id number]  Subjective Pt seen in his room. Today he c/o constipation to the Nursing Staff but stated to the under signed that he had [facility] which caused flank pain and pointed to his left flank and that feels like constipation.  Pt continues to have multiple somatic complaints and it is a new one every day. He denies any other new somatic complaints but is concerned more about his physical health and psychiatric conditions. He has been compliant with psychotropic medications and denies any physical adverse side effects patient with those medications. He denies any auditory or visual hallucinations. He denies any paranoid thoughts or delusions. He says his mood has been depressed and he does endorse some anhedonia. He has been fairly isolative to his room but did attend some groups. He denies any current active or passive suicidal thoughts or psychotic symptoms. He has been eating well. Per nursing he slept well and rested well.   Past psychiatric history. As above he carries multiple diagnoses. He was hospitalized at [facility] at least twice before. The patient is not a good historian and unable to give me any details of his medication history. She denies ever attempting suicide. Upon further questioning the patient has all his problems started in October 2015 when he suffered severe head injury that led to depression, mood instability, cognitive decline. He used to be evaluated regarding his high school but now has difficulties remembering everyday stuff. He was diagnosed with dementia praecox. He also believes that all his somatic problems constipation and urinary tract infection and insomnia targeted at the same time.  Family psychiatric history. He denies any history of any mental illness or substance use in the family.  Social history. He was married twice but now divorced. He has two children age 54 and 27. He did well in school. He used  to work as a Electronics engineer. He became dysfunctional following head injury. He is homeless, uninsured, with no social support. He applied for disability but was turned down and called a Risk manager.  Medical history: He has a long-standing history of multiple medical complaints most of which are never found to be diagnosable. He is always complaining of chronic severe constipation and trouble sleeping. Possible high blood pressure for real but no other known medical problems.  Substance abuse history: Patient says he is not drinking and does not use drugs. He says he dabbled in the years ago but patient's gait completely off of them now for years  Total Time spent with patient: 20 minutes  Principal Problem: Major depressive disorder, recurrent episode, severe, with psychosis (Home) Diagnosis:   Patient Active Problem List   Diagnosis Date Noted  . Major depressive disorder, recurrent episode, severe, with psychosis ([place]) [F33.3] 03/30/2015  . Chronic constipation [K59.00] 03/29/2015  . 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 Cervical pain [M54.2] 01/18/2015  . Suicide threat or attempt [F48.9] 12/14/2014  . Cognitive and behavioral changes [R41.89, F91.9] 11/22/2014  . Microscopic hematuria [R31.29] 11/22/2014  . Disorder of ejaculation [N53.19] 11/22/2014  . 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 H/O autism spectrum disorder [Z86.59] 10/20/2014  . GAD (generalized anxiety disorder) [F41.1] 10/19/2014  . Tooth ache [K08.89] 10/19/2014  . Suicidal ideation [R45.851]   . Benign essential HTN [I10] 08/27/2014  . Decreased potassium in the blood [E87.6] 12/05/2013  . 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  Prostatitis [N41.9] 12/05/2013  . Schizophrenia ([place]) [F20.9] 12/05/2013   Total Time spent with patient: 30 minutes  Past Psychiatric History: Depression and  psychosis.  Past Medical History:  Past Medical History  Diagnosis Date  . Hypertension   . Rectal fistula   . Dysuria   . Diverticulosis   . Gastroparesis   . Borderline personality disorder   . Major depression ([place])   . Schizophrenia ([place])   . Hypokalemia   . Microscopic hematuria   . External hemorrhoid   . Over weight   . Nicotine addiction   . Difficulty urinating   . Stroke ([place])   . Benign essential HTN 08/27/2014  . BP (high blood pressure) 01/16/2015  . Anxiety     Past Surgical History  Procedure Laterality Date  . Lumbar spine surgery    . Colonoscopy    . Colonoscopy with propofol N/A 10/12/2014    Procedure: COLONOSCOPY WITH PROPOFOL;  Surgeon: Hulen Luster, MD;  Location: [facility] ENDOSCOPY;  Service: Gastroenterology;  Laterality: N/A;  . Tonsillectomy    . Back surgery     Family History:  Family History  Problem Relation Age of Onset  . Stroke Mother   . Stroke Maternal Grandmother   . Diabetes Mellitus II Sister    Family Psychiatric  History: None reported. Social History:  History  Alcohol Use No    Comment: last drink was in march 2016     History  Drug Use No    Social History   Social History  . Marital Status: Single    Spouse Name: N/A  . Number of Children: N/A  . Years of Education: N/A   Social History Main Topics  . Smoking status: Former Smoker    Types: Cigarettes    Quit date: 08/19/2014  . Smokeless tobacco: None  . Alcohol Use: No     Comment: last drink was in march 2016  . Drug Use: No  . Sexual Activity: Not Asked   Other Topics Concern  . None   Social History Narrative    Sleep: Good  Appetite:  Fair  Current Medications: Current Facility-Administered Medications  Medication Dose Route Frequency Provider Last Rate Last Dose  . acetaminophen (TYLENOL) tablet 650 mg  650 mg Oral Q6H PRN Gonzella Lex, MD   650 mg at 04/24/15 1833  . alum & mag hydroxide-simeth (MAALOX/MYLANTA) 200-200-20 MG/5ML suspension 30  mL  30 mL Oral Q4H PRN Gonzella Lex, MD      . amLODipine (NORVASC) tablet 7.5 mg  7.5 mg Oral Daily Chauncey Mann, MD   7.5 mg at 04/26/15 0908  . ARIPiprazole (ABILIFY) tablet 20 mg  20 mg Oral Daily Hildred Priest, MD   20 mg at 04/26/15 0907  . ARIPiprazole SUSR 400 mg  400 mg Intramuscular Q28 days Clovis Fredrickson, MD   400 mg at 04/06/15 1429  . diphenhydrAMINE (BENADRYL) capsule 50 mg  50 mg Oral QHS Gonzella Lex, MD   50 mg at 04/25/15 2217  . docusate sodium (COLACE) capsule 200 mg  200 mg Oral BID Clovis Fredrickson, MD   200 mg at 04/25/15 2218  . doxepin (SINEQUAN) capsule 150 mg  150 mg Oral QHS Jolanta B Pucilowska, MD   150 mg at 04/25/15 2217  . haloperidol (HALDOL) tablet 5 mg  5 mg Oral QHS Clovis Fredrickson, MD   5 mg at 04/25/15 2144  . ibuprofen (ADVIL,MOTRIN) tablet 600 mg  600 mg Oral Q6H PRN Gonzella Lex, MD   600 mg at 04/04/15 1444  . magnesium hydroxide (MILK OF MAGNESIA) suspension 30 mL  30 mL Oral Daily PRN Gonzella Lex, MD      . metoprolol succinate (TOPROL-XL) 24 hr tablet 50 mg  50 mg Oral Daily Jolanta B Pucilowska, MD   50 mg at 04/26/15 0908  . polyethylene glycol (MIRALAX / GLYCOLAX) packet 17 g  17 g Oral Daily Clovis Fredrickson, MD   17 g at 04/26/15 0907  . temazepam (RESTORIL) capsule 30 mg  30 mg Oral QHS PRN Clovis Fredrickson, MD   30 mg at 04/25/15 2217  . testosterone enanthate (DELATESTRYL) injection 200 mg  200 mg Intramuscular Q28 days Clovis Fredrickson, MD   200 mg at 04/06/15 1143  . venlafaxine XR (EFFEXOR-XR) 24 hr capsule 300 mg  300 mg Oral Q breakfast Clovis Fredrickson, MD   300 mg at 04/26/15 0907    Lab Results:  Results for orders placed or performed during the hospital encounter of 03/30/15 (from the past 48 hour(s))  Basic metabolic panel     Status: Abnormal   Collection Time: 04/25/15  2:40 PM  Result Value Ref Range   Sodium 134 (L) 135 - 145 mmol/L   Potassium 4.4 3.5 - 5.1 mmol/L    Chloride 98 (L) 101 - 111 mmol/L   CO2 28 22 - 32 mmol/L   Glucose, Bld 108 (H) 65 - 99 mg/dL   BUN 12 6 - 20 mg/dL   Creatinine, Ser 0.75 0.61 - 1.24 mg/dL   Calcium 9.6 8.9 - 10.3 mg/dL   GFR calc non Af Amer >60 >60 mL/min   GFR calc Af Amer >60 >60 mL/min    Comment: (NOTE) The eGFR has been calculated using the CKD EPI equation. This calculation has not been validated in all clinical situations. eGFR's persistently <60 mL/min signify possible Chronic Kidney Disease.    Anion gap 8 5 - 15    Musculoskeletal: Strength & Muscle Tone: within normal limits Gait & Station: normal Patient leans: N/A  Psychiatric Specialty Exam: Review of Systems  Constitutional: Negative.  Negative for fever, chills, weight loss, malaise/fatigue and diaphoresis.  HENT: Negative for congestion, ear pain, hearing loss, nosebleeds, sore throat and tinnitus.   Eyes: Positive for blurred vision, photophobia and pain. Negative for double vision and redness.       The patient complains of a worsening headache over the past 2 days.  Respiratory: Negative.  Negative for cough, hemoptysis, sputum production, shortness of breath and wheezing.   Cardiovascular: Negative.  Negative for chest pain, palpitations, orthopnea, claudication and leg swelling.  Gastrointestinal: Negative.  Negative for heartburn, nausea, vomiting, abdominal pain, diarrhea, constipation and blood in stool.  Genitourinary: Negative.  Negative for dysuria, urgency, frequency and hematuria.  Musculoskeletal: Negative.  Negative for myalgias, back pain, joint pain, falls and neck pain.  Skin: Negative.  Negative for itching and rash.  Neurological: Positive for headaches. Negative for dizziness, tingling, tremors, sensory change, speech change, focal weakness and seizures.  Endo/Heme/Allergies: Negative.  Does not bruise/bleed easily.  Psychiatric/Behavioral: Positive for depression and suicidal ideas. The patient has insomnia.   All other  systems reviewed and are negative.   Blood pressure 119/73, pulse 83, temperature 97.6 F (36.4 C), temperature source Oral, resp. rate 20, height '5\' 7"'  (1.702 m), weight 148 lb (67.132 kg), SpO2 98 %.Body mass index is 23.17 kg/(m^2).  General  Appearance: Fairly Groomed  Engineer, water::  Poor - The patient would sit with his head down  Speech:  Clear and Coherent  Volume:  Normal  Mood:  Okay  Affect:  Blunt  Thought Process:  Tangential and pre-occupied about physical problems  Orientation:  Full (Time, Place, and Person)  Thought Content:  Perseverates on the head injury in his memory problems. Stays withdrawn in bed. Today he stated that he is here for physical problems and that he has no mental illness.  Suicidal Thoughts:  No  Homicidal Thoughts:  No  Memory:  Immediate;   Poor Recent;   Poor Remote;   Poor  Judgement:  Poor and Impaired.  Insight:  Lacking  Psychomotor Activity:  Decreased  Concentration:  Poor  Recall:  Poor  Fund of Knowledge:Poor  Language: Poor  Akathisia:  No  Handed:  Right  AIMS (if indicated):     Assets:  Communication Skills Desire for Improvement Physical Health Resilience  ADL's:  Intact  Cognition: WNL  Sleep:  Well and rested well.   Treatment Plan Summary: Daily contact with patient to assess and evaluate symptoms and progress in treatment and Medication management will be done. Pt was assured that undersigned will look into his labs and test results and he felt glad.  Jeremiah [name] is a 49 year old male with a history of depression and psychosis admitting floridly psychotic and depressed in the context of medication noncompliance and severe social stressors.  1. Suicidal ideation. Denies suicidal ideation and contracts for safety.  2. Mood. The patient is taking Effexor Xr 182m po daily for depression and anxiety and Abilify for psychosis. Abilify was increased to 20 mg daily. He accepted Abilify maintena injection of 4011mthat will be  given monthly.. We added Haldol 5 mg nightly for psychosis. On 11-24 patient's Abilify was changed from daily at bedtime to every morning and since then appears that the patient has been sleeping better  3. Anxiety. The patient reportedly was prescribed Xanax in the community. Given concerns about cognitive decline, he completed Librium taper and will do it slowly.  4. Hypertension: On metoprolol and amlodipine. His blood pressure was low this morning and will decrease Norvasc to 7.60m48mo daily. Watch Vitals. and continue to monitor  5. Constipation. It is unclear if this is delusional. Seems like constant complaint. The patient is on bowel regimen. He reports feeling less constipated with improved appetite.  He did not complain about constipation today.  6. Insomnia. The patient claims not to be able to sleep for months. He is on  restoril 30 mg and Sinequan 150. Patient is slept better last night, staff reports 6.5 hours last night.  7. Urinary tract infection. The patient has a history of prostatitis he believes that he needs to be on antibiotic. Urinalysis is not suggestive of urinary tract infection, urine culture is negative. No urinary complaints  Will follow. Repeat UA was negative.   8. Metabolic syndrome. Lipid panel is not elevated. TSH normal.   9. Headaches. This is a new complaint over the weekend.the patient now claims that he's had headaches since his head injury in October 2015. This is addressed with ibuprofen. Will also check CT Head given prior TBI.  10. ADLs. Occupational therapy consult was completed. it appears that low energy and tiredness are major obstacles.   11. Cognitive decline. Psychological consultation completed and did not detect gross cognitive difficulties. Please see results.This appears to be his baseline.   12. Hypogonadism.  We ordered testosterone injections.   13. ECT. After 17 days in the hospital the patient is still severely depressed, delusional,  and suicidal. He was referred to [facility] for possible ECT treatment.  14. Disposition. To be established. He is homeless and uninsured, and without any support. ACSW will explore availability of resources in the Community.Continue current meds and constant re-direction provided and re-assurance provided.    Camie Patience K 04/26/2015, 3:28 PM

## 2015-04-26 NOTE — Plan of Care (Signed)
Problem: Alteration in mood; excessive anxiety as evidenced by: Goal: STG-Pt can identify coping skills to manage panic/anxiety (Patient can identify at least ____ coping skills to manage panic/anxiety attack)  Outcome: Not Progressing Still preoccupied with head injury & depression.

## 2015-04-26 NOTE — [facility] Group Notes (Signed)
[facility] LCSW Group Therapy  04/26/2015 4:56 PM  Type of Therapy:  Group Therapy  Participation Level:  Did Not Attend  Modes of Intervention:  Discussion, Education, Socialization and Support  Summary of Progress/Problems: Emotional Regulation: Patients will identify both negative and positive emotions. They will discuss emotions they have difficulty regulating and how they impact their lives. Patients will be asked to identify healthy coping skills to combat unhealthy reactions to negative emotions.     [name] L [name] MSW, LCSWA  04/26/2015, 4:56 PM  

## 2015-04-26 NOTE — [facility] Group Notes (Signed)
[facility] LCSW Aftercare Discharge Planning Group Note   04/26/2015 11:41 AM  Participation Quality:  Patient did not attend group.    Keene Breath, MSW, LCSWA

## 2015-04-26 NOTE — [facility] Group Notes (Signed)
[place] Group Notes:  (Nursing/MHT/Case Management/Adjunct)  Date:  04/26/2015  Time:  2:41 AM  Type of Therapy:  Group Therapy  Participation Level:  Did Not Attend  Summary of Progress/Problems:  Jeremiah [name] 04/26/2015, 2:41 AM

## 2015-04-27 NOTE — [facility] Group Notes (Signed)
[place] Group Notes:  (Nursing/MHT/Case Management/Adjunct)  Date:  04/27/2015  Time:  1:29 AM  Type of Therapy:  Group Therapy  Participation Level:  Did Not Attend    Summary of Progress/Problems:  Jeremiah [name] 04/27/2015, 1:29 AM

## 2015-04-27 NOTE — Tx Team (Signed)
Interdisciplinary Treatment Plan Update (Adult)  Date:  04/27/2015 Time Reviewed:  3:47 PM  Progress in Treatment: Attending groups: No. Participating in groups:  No. Taking medication as prescribed:  Yes. Tolerating medication:  Yes. Family/Significant othe contact made:  Yes, individual(s) contacted:  Daquarius Dubeau (ex-wife) [phone] Patient understands diagnosis:  Yes. Discussing patient identified problems/goals with staff:  Yes. Medical problems stabilized or resolved:  Yes. Denies suicidal/homicidal ideation: Yes. Issues/concerns per patient self-inventory:  Yes. Other:  New problem(s) identified: No, Describe:  NA  Discharge Plan or Barriers: Pt is homeless and was last living with his sister and does not want to go to a shelter. Patient has no income, no employment, no disability, and no insurance. Patient reports that he has dementia and possibly cancer and other somatic symptoms. Patient may be delusional and is referred to a Care Coordinator for support. Patient ex-wife is supportive and explains that she has no options for housing to offer but states that patient truly has no supportive family members.  Reason for Continuation of Hospitalization: Delusions  Medication stabilization  Suicidal Ideations   Comments: Patient remains delusional and paranoid. Patient reports somatic symptoms and believes he may have cancer or dementia and not long to live.  Estimated length of stay: 5-7 days   New goal(s):  Review of initial/current patient goals per problem list:   1.  Goal(s): Patient will participate in aftercare plan * Met: No * Target date: at discharge * As evidenced by: Patient will participate within aftercare plan AEB aftercare provider and housing plan at discharge being identified.   2.  Goal (s): Patient will exhibit decreased depressive symptoms and suicidal ideations. * Met:  *  Target date: at discharge * As evidenced by: Patient will utilize self  rating of depression at 3 or below and demonstrate decreased signs of depression or be deemed stable for discharge by MD.  2.  Goal (s): Patient will demonstrate decreased symptoms of psychosis. * Met: No  *  Target date: at discharge * As evidenced by: Patient will not endorse signs of psychosis or be deemed stable for discharge by MD. *   Attendees: Physician:   Camie Patience, MD  12/6/20163:47 PM  Nursing:  Carolynn Sayers, RN  12/6/20163:47 PM  Other:  Fransisca Connors 12/6/20163:47 PM  Other: Tilden Fossa, LCSWA 12/6/20163:47 PM  Other: Marylou Flesher, LCSWA 12/6/20163:47 PM  Other:  Dossie Arbour, LCSW 12/6/20163:47 PM   Scribe for Treatment Team:   Keene Breath, MSW, [place]  04/27/2015, 3:47 PM

## 2015-04-27 NOTE — Plan of Care (Signed)
Problem: Alteration in mood Goal: STG-Patient reports thoughts of self-harm to staff Outcome: Not Applicable Date Met:  67/59/16 Calm and cooperative. Flat affect. Med compliant. No voiced injuries noted. No injuries noted. Denies SI/HI. Will continue to monitor for safety and behavior.

## 2015-04-27 NOTE — Progress Notes (Signed)
[facility] MD Progress Note  04/27/2015 4:05 PM Jeremiah [name] MRN:  [id number]  Subjective Pt seen in his room. Today he c/o UTI and is pre-occupied about the same. He was explained that his UA was normal but still he could not be re-directed. His thoughts are so concrete that it is like a fixed somatic delusion.  Pt continues to have multiple somatic complaints and it is a new one every day. He denies any other new somatic complaints but is concerned more about his physical health and psychiatric conditions. He has been compliant with psychotropic medications and denies any physical adverse side effects patient with those medications. He denies any auditory or visual hallucinations. He denies any paranoid thoughts or delusions. He says his mood has been depressed and he does endorse some anhedonia. He has been fairly isolative to his room but did attend some groups. He denies any current active or passive suicidal thoughts or psychotic symptoms. He has been eating well. Per nursing he slept well and rested well.   Past psychiatric history. As above he carries multiple diagnoses. He was hospitalized at [facility] at least twice before. The patient is not a good historian and unable to give me any details of his medication history. She denies ever attempting suicide. Upon further questioning the patient has all his problems started in October 2015 when he suffered severe head injury that led to depression, mood instability, cognitive decline. He used to be evaluated regarding his high school but now has difficulties remembering everyday stuff. He was diagnosed with dementia praecox. He also believes that all his somatic problems constipation and urinary tract infection and insomnia targeted at the same time.  Family psychiatric history. He denies any history of any mental illness or substance use in the family.  Social history. He was married twice but now divorced. He has two children age 40 and 78. He did  well in school. He used to work as a Electronics engineer. He became dysfunctional following head injury. He is homeless, uninsured, with no social support. He applied for disability but was turned down and called a Risk manager.  Medical history: He has a long-standing history of multiple medical complaints most of which are never found to be diagnosable. He is always complaining of chronic severe constipation and trouble sleeping. Possible high blood pressure for real but no other known medical problems.  Substance abuse history: Patient says he is not drinking and does not use drugs. He says he dabbled in the years ago but patient's gait completely off of them now for years  Total Time spent with patient: 20 minutes  Principal Problem: Major depressive disorder, recurrent episode, severe, with psychosis ([place]) Diagnosis:   Patient Active Problem List   Diagnosis Date Noted  . Major depressive disorder, recurrent episode, severe, with psychosis ([place]) [F33.3] 03/30/2015  . Chronic constipation [K59.00] 03/29/2015  . 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 Cervical pain [M54.2] 01/18/2015  . Suicide threat or attempt [F48.9] 12/14/2014  . Cognitive and behavioral changes [R41.89, F91.9] 11/22/2014  . Microscopic hematuria [R31.29] 11/22/2014  . Disorder of ejaculation [N53.19] 11/22/2014  . 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 H/O autism spectrum disorder [Z86.59] 10/20/2014  . GAD (generalized anxiety disorder) [F41.1] 10/19/2014  . Tooth ache [K08.89] 10/19/2014  . Suicidal ideation [R45.851]   . Benign essential HTN [I10] 08/27/2014  . Decreased potassium in the blood [E87.6] 12/05/2013  . 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 Prostatitis [N41.9] 12/05/2013  . Schizophrenia ([place]) [F20.9] 12/05/2013   Total Time spent with patient: 30 minutes  Past Psychiatric History:  Depression and psychosis.  Past Medical History:  Past Medical History  Diagnosis Date  . Hypertension   . Rectal fistula   . Dysuria   . Diverticulosis   . Gastroparesis   . Borderline personality disorder   . Major depression ([place])   . Schizophrenia ([place])   . Hypokalemia   . Microscopic hematuria   . External hemorrhoid   . Over weight   . Nicotine addiction   . Difficulty urinating   . Stroke ([place])   . Benign essential HTN 08/27/2014  . BP (high blood pressure) 01/16/2015  . Anxiety     Past Surgical History  Procedure Laterality Date  . Lumbar spine surgery    . Colonoscopy    . Colonoscopy with propofol N/A 10/12/2014    Procedure: COLONOSCOPY WITH PROPOFOL;  Surgeon: Hulen Luster, MD;  Location: [facility] ENDOSCOPY;  Service: Gastroenterology;  Laterality: N/A;  . Tonsillectomy    . Back surgery     Family History:  Family History  Problem Relation Age of Onset  . Stroke Mother   . Stroke Maternal Grandmother   . Diabetes Mellitus II Sister    Family Psychiatric  History: None reported. Social History:  History  Alcohol Use No    Comment: last drink was in march 2016     History  Drug Use No    Social History   Social History  . Marital Status: Single    Spouse Name: N/A  . Number of Children: N/A  . Years of Education: N/A   Social History Main Topics  . Smoking status: Former Smoker    Types: Cigarettes    Quit date: 08/19/2014  . Smokeless tobacco: None  . Alcohol Use: No     Comment: last drink was in march 2016  . Drug Use: No  . Sexual Activity: Not Asked   Other Topics Concern  . None   Social History Narrative    Sleep: Good  Appetite:  Fair  Current Medications: Current Facility-Administered Medications  Medication Dose Route Frequency Provider Last Rate Last Dose  . acetaminophen (TYLENOL) tablet 650 mg  650 mg Oral Q6H PRN Gonzella Lex, MD   650 mg at 04/27/15 0926  . alum & mag hydroxide-simeth (MAALOX/MYLANTA) 200-200-20 MG/5ML  suspension 30 mL  30 mL Oral Q4H PRN Gonzella Lex, MD      . amLODipine (NORVASC) tablet 7.5 mg  7.5 mg Oral Daily Chauncey Mann, MD   7.5 mg at 04/27/15 0916  . ARIPiprazole (ABILIFY) tablet 20 mg  20 mg Oral Daily Hildred Priest, MD   20 mg at 04/27/15 0916  . ARIPiprazole SUSR 400 mg  400 mg Intramuscular Q28 days Clovis Fredrickson, MD   400 mg at 04/06/15 1429  . diphenhydrAMINE (BENADRYL) capsule 50 mg  50 mg Oral QHS Gonzella Lex, MD   50 mg at 04/26/15 2157  . docusate sodium (COLACE) capsule 200 mg  200 mg Oral BID Clovis Fredrickson, MD   200 mg at 04/27/15 0915  . doxepin (SINEQUAN) capsule 150 mg  150 mg Oral QHS Jolanta B Pucilowska, MD   150 mg at 04/26/15 2157  . haloperidol (HALDOL) tablet 5 mg  5 mg Oral QHS Clovis Fredrickson, MD   5 mg at 04/26/15 2156  . ibuprofen (  ADVIL,MOTRIN) tablet 600 mg  600 mg Oral Q6H PRN Gonzella Lex, MD   600 mg at 04/04/15 1444  . magnesium hydroxide (MILK OF MAGNESIA) suspension 30 mL  30 mL Oral Daily PRN Gonzella Lex, MD      . metoprolol succinate (TOPROL-XL) 24 hr tablet 50 mg  50 mg Oral Daily Jolanta B Pucilowska, MD   50 mg at 04/27/15 0915  . polyethylene glycol (MIRALAX / GLYCOLAX) packet 17 g  17 g Oral Daily Jolanta B Pucilowska, MD   17 g at 04/27/15 0915  . temazepam (RESTORIL) capsule 30 mg  30 mg Oral QHS PRN Clovis Fredrickson, MD   30 mg at 04/26/15 2156  . testosterone enanthate (DELATESTRYL) injection 200 mg  200 mg Intramuscular Q28 days Clovis Fredrickson, MD   200 mg at 04/06/15 1143  . venlafaxine XR (EFFEXOR-XR) 24 hr capsule 300 mg  300 mg Oral Q breakfast Jolanta B Pucilowska, MD   300 mg at 04/27/15 0915    Lab Results:  No results found for this or any previous visit (from the past 48 hour(s)).  Musculoskeletal: Strength & Muscle Tone: within normal limits Gait & Station: normal Patient leans: N/A  Psychiatric Specialty Exam: Review of Systems  Constitutional: Negative.  Negative for  fever, chills, weight loss, malaise/fatigue and diaphoresis.  HENT: Negative for congestion, ear pain, hearing loss, nosebleeds, sore throat and tinnitus.   Eyes: Positive for blurred vision, photophobia and pain. Negative for double vision and redness.       The patient complains of a worsening headache over the past 2 days.  Respiratory: Negative.  Negative for cough, hemoptysis, sputum production, shortness of breath and wheezing.   Cardiovascular: Negative.  Negative for chest pain, palpitations, orthopnea, claudication and leg swelling.  Gastrointestinal: Negative.  Negative for heartburn, nausea, vomiting, abdominal pain, diarrhea, constipation and blood in stool.  Genitourinary: Negative.  Negative for dysuria, urgency, frequency and hematuria.  Musculoskeletal: Negative.  Negative for myalgias, back pain, joint pain, falls and neck pain.  Skin: Negative.  Negative for itching and rash.  Neurological: Positive for headaches. Negative for dizziness, tingling, tremors, sensory change, speech change, focal weakness and seizures.  Endo/Heme/Allergies: Negative.  Does not bruise/bleed easily.  Psychiatric/Behavioral: Positive for depression and suicidal ideas. The patient has insomnia.   All other systems reviewed and are negative.   Blood pressure 94/67, pulse 72, temperature 98.1 F (36.7 C), temperature source Oral, resp. rate 18, height 5\' 7"  (1.702 m), weight 148 lb (67.132 kg), SpO2 98 %.Body mass index is 23.17 kg/(m^2).  General Appearance: Fairly Groomed  Engineer, water::  Poor - The patient would sit with his head down  Speech:  Clear and Coherent  Volume:  Normal  Mood:  Okay  Affect:  Blunt  Thought Process:  Tangential and pre-occupied about physical problems  Orientation:  Full (Time, Place, and Person)  Thought Content:  Today he is pre-occupied and perseverates about his UTI to the extent that it is like a fixed delusion.  Suicidal Thoughts:  No  Homicidal Thoughts:  No   Memory:  Immediate;   Poor Recent;   Poor Remote;   Poor  Judgement:  Poor and Impaired.  Insight:  Lacking  Psychomotor Activity:  Decreased  Concentration:  Poor  Recall:  Poor  Fund of Knowledge:Poor  Language: Poor  Akathisia:  No  Handed:  Right  AIMS (if indicated):     Assets:  Limited.  ADL's:  Intact  Cognition: WNL  Sleep:  Well and rested well.   Treatment Plan Summary: Daily contact with patient to assess and evaluate symptoms and progress in treatment and Medication management will be done. Pt was assured that undersigned will look into his labs and test results and he felt glad.  Mr. Calvanese is a 49 year old male with a history of depression and psychosis admitting floridly psychotic and depressed in the context of medication noncompliance and severe social stressors.  1. Suicidal ideation. Denies suicidal ideation and contracts for safety.  2. Mood. The patient is taking Effexor Xr 150mg  po daily for depression and anxiety and Abilify for psychosis. Abilify was increased to 20 mg daily. He accepted Abilify maintena injection of 400mg  that will be given monthly.. We added Haldol 5 mg nightly for psychosis. On 11-24 patient's Abilify was changed from daily at bedtime to every morning and since then appears that the patient has been sleeping better  3. Anxiety. The patient reportedly was prescribed Xanax in the community. Given concerns about cognitive decline, he completed Librium taper and will do it slowly.  4. Hypertension: On metoprolol and amlodipine. His blood pressure was low this morning and will decrease Norvasc to 7.5mg  po daily. Watch Vitals. and continue to monitor  5. Constipation. It is unclear if this is delusional. Seems like constant complaint. The patient is on bowel regimen. He reports feeling less constipated with improved appetite.  He did not complain about constipation today.  6. Insomnia. The patient claims not to be able to sleep for months. He  is on  restoril 30 mg and Sinequan 150. Patient is slept better last night, staff reports 6.5 hours last night.  7. Urinary tract infection. The patient has a history of prostatitis he believes that he needs to be on antibiotic. Urinalysis is not suggestive of urinary tract infection, urine culture is negative. No urinary complaints  Will follow. Repeat UA was negative. Pt was again explained and re-assured about the same.  8. Metabolic syndrome. Lipid panel is not elevated. TSH normal.   9. Headaches. This is a new complaint over the weekend.the patient now claims that he's had headaches since his head injury in October 2015. This is addressed with ibuprofen. Will also check CT Head given prior TBI.  10. ADLs. Occupational therapy consult was completed. it appears that low energy and tiredness are major obstacles.   11. Cognitive decline. Psychological consultation completed and did not detect gross cognitive difficulties. Please see results.This appears to be his baseline.   12. Hypogonadism. We ordered testosterone injections.   13. ECT. After 17 days in the hospital the patient is still severely depressed, delusional, and suicidal. He was [facility] to [facility] for [facility] E[facility] treatment.  14. Disposition. To be established. He is homeless and uninsured, and without any support. ACSW will explore availability of resources in the Community.Continue current meds and constant re-direction provided and re-assurance provided.    Jeremiah [name] 04/27/2015, 4:05 PM

## 2015-04-27 NOTE — Progress Notes (Signed)
Patient with sad affect, cooperative behavior with meals and meds. No SI/HI at this time. Patient vague inconversation. Attempts to encouraged therapy groups and coping skills with poor effect, patient will speak of vague ideas of what is wrong and and what will help. Social with select male peer. Safety maintained.

## 2015-04-27 NOTE — B[facility] Group Notes (Signed)
[facility] LCSW Group Therapy  04/27/2015 5:02 PM  Type of Therapy:  Group Therapy  Participation Level:  Did Not Attend   Keene Breath, MSW, LCSWA 04/27/2015, 5:02 PM

## 2015-04-27 NOTE — Progress Notes (Signed)
Recreation Therapy Notes  Date: 12.06.16 Time: 3:20 pm Location: Craft Room  Group Topic: Goal Setting  Goal Area(s) Addresses:  Patient will write at least one goal. Patient will write at least one obstacle.  Behavioral Response: Did not attend  Intervention: Recovery Goal Chart  Activity: Patients were instructed to make a Recovery Goal Chart including goals, obstacles, the date they started working on their goals, and the date they achieved their goal.  Education: LRT educated patients on healthy ways they can celebrate reaching their goals.  Education Outcome: Patient did not attend group.  Clinical Observations/Feedback: Patient did not attend group.  Leonette Monarch, LRT/CTRS 04/27/2015 4:04 PM

## 2015-04-27 NOTE — Progress Notes (Signed)
Calm and cooperative. Flat affect. Med compliant. C/o insomnia. Restoril 30 mg and tylenol 650 mg given as ordered PRN at 2159. No behavior problems noted. Denies SI/HI. Slept 7.15 hours.

## 2015-04-27 NOTE — BHH Group Notes (Signed)
[facility] Group Notes:  (Nursing/MHT/Case Management/Adjunct)  Date:  04/27/2015  Time:  2:38 PM  Type of Therapy:  Psychoeducational Skills  Participation Level:  Did Not Attend  [place] 04/27/2015, 2:38 PM

## 2015-04-28 MED ORDER — AMLODIPINE BESYLATE 5 MG PO TABS
5.0000 mg | ORAL_TABLET | Freq: Every day | ORAL | Status: DC
  Administered 2015-04-29 – 2015-05-04 (×6): 5 mg via ORAL
  Filled 2015-04-28 (×7): qty 1

## 2015-04-28 MED ORDER — CLONAZEPAM 0.5 MG PO TABS
0.5000 mg | ORAL_TABLET | Freq: Three times a day (TID) | ORAL | Status: DC
  Administered 2015-04-28 – 2015-04-29 (×2): 0.5 mg via ORAL
  Filled 2015-04-28 (×3): qty 1

## 2015-04-28 MED ORDER — ARIPIPRAZOLE 15 MG PO TABS
30.0000 mg | ORAL_TABLET | Freq: Every day | ORAL | Status: DC
  Administered 2015-04-29: 30 mg via ORAL
  Filled 2015-04-28: qty 2

## 2015-04-28 NOTE — Progress Notes (Addendum)
[facility] MD Progress Note  04/28/2015 3:00 PM Surya.K.Challa MRN:  [id number]  Subjective Pt continues to have multiple somatic complaints and it is a new one every day. Today the patient reports he has a mass in his abdomen and he thinks it is a tumor, he says he had a tumor before. He has been staying in his room more often and says that is because his head is hurting he feels that his brain is being herniated. He continues to complain of not being able to sleep and having ongoing issues with constipation.   He has been compliant with psychotropic medications and denies any physical adverse side effects patient with those medications. He denies any auditory or visual hallucinations. He denies any paranoid thoughts or delusions. He says his mood has been depressed and he does endorse some anhedonia. He has been fairly isolative to his room but did attend some groups. He denies any current active or passive suicidal thoughts or psychotic symptoms. He has been eating well. Per nursing he slept well and rested well.  Parents that he is slept 6 hours  Per nursing: D: Pt in room upon approach. Affect appears sad and anxious. Pt continues to be preoccupied with head injury and somatic symptoms. Reports passive SI. Pt does contract for safety. Denies HI/AVH. Pt complains of headache rated 9 out of 10. Pt states "something's wrong, I just don't know how to express it." Pt is seen talking on the phone most of the evening during phone times. A: Emotional support and encouragement provided. PRN tylenol given. Medications given as prescribed. q15 minute safety checks maintained. R: Pt remains free from harm.  Past psychiatric history. As above he carries multiple diagnoses. He was hospitalized at [facility] at least twice before. The patient is not a good historian and unable to give me any details of his medication history. She denies ever attempting suicide. Upon further questioning the patient has all his  problems started in October 2015 when he suffered severe head injury that led to depression, mood instability, cognitive decline. He used to be evaluated regarding his high school but now has difficulties remembering everyday stuff. He was diagnosed with dementia praecox. He also believes that all his somatic problems constipation and urinary tract infection and insomnia targeted at the same time.  Family psychiatric history. He denies any history of any mental illness or substance use in the family.  Social history. He was married twice but now divorced. He has two children age 4 and 44. He did well in school. He used to work as a Electronics engineer. He became dysfunctional following head injury. He is homeless, uninsured, with no social support. He applied for disability but was turned down and called a Risk manager.  Medical history: He has a long-standing history of multiple medical complaints most of which are never found to be diagnosable. He is always complaining of chronic severe constipation and trouble sleeping. Possible high blood pressure for real but no other known medical problems.  Substance abuse history: Patient says he is not drinking and does not use drugs. He says he dabbled in the years ago but patient's gait completely off of them now for years  Total Time spent with patient: 20 minutes  Principal Problem: Major depressive disorder, recurrent episode, severe, with psychosis ([place]) Diagnosis:   Patient Active Problem List   Diagnosis Date Noted  . Major depressive disorder, recurrent episode, severe, with psychosis ([place]) [F33.3] 03/30/2015  . Chronic constipation [K59.00] 03/29/2015  .  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 Cervical pain [M54.2] 01/18/2015  . Disorder of ejaculation [N53.19] 11/22/2014  . 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 GAD (generalized anxiety  disorder) [F41.1] 10/19/2014  . Benign essential HTN [I10] 08/27/2014  . 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  Past Medical History:  Past Medical History  Diagnosis Date  . Hypertension   . Rectal fistula   . Dysuria   . Diverticulosis   . Gastroparesis   . Borderline personality disorder   . Major depression ([place])   . Schizophrenia ([place])   . Hypokalemia   . Microscopic hematuria   . External hemorrhoid   . Over weight   . Nicotine addiction   . Difficulty urinating   . Stroke ([place])   . Benign essential HTN 08/27/2014  . BP (high blood pressure) 01/16/2015  . Anxiety     Past Surgical History  Procedure Laterality Date  . Lumbar spine surgery    . Colonoscopy    . Colonoscopy with propofol N/A 10/12/2014    Procedure: COLONOSCOPY WITH PROPOFOL;  Surgeon: Hulen Luster, MD;  Location: [facility] ENDOSCOPY;  Service: Gastroenterology;  Laterality: N/A;  . Tonsillectomy    . Back surgery     Family History:  Family History  Problem Relation Age of Onset  . Stroke Mother   . Stroke Maternal Grandmother   . Diabetes Mellitus II Sister    Family Psychiatric  History: None reported. Social History:  History  Alcohol Use No    Comment: last drink was in march 2016     History  Drug Use No    Social History   Social History  . Marital Status: Single    Spouse Name: N/A  . Number of Children: N/A  . Years of Education: N/A   Social History Main Topics  . Smoking status: Former Smoker    Types: Cigarettes    Quit date: 08/19/2014  . Smokeless tobacco: None  . Alcohol Use: No     Comment: last drink was in march 2016  . Drug Use: No  . Sexual Activity: Not Asked   Other Topics Concern  . None   Social History Narrative    Sleep: Good  Appetite:  Fair  Current Medications: Current Facility-Administered Medications  Medication Dose Route Frequency Provider  Last Rate Last Dose  . acetaminophen (TYLENOL) tablet 650 mg  650 mg Oral Q6H PRN Gonzella Lex, MD   650 mg at 04/27/15 2231  . alum & mag hydroxide-simeth (MAALOX/MYLANTA) 200-200-20 MG/5ML suspension 30 mL  30 mL Oral Q4H PRN Gonzella Lex, MD      . Derrill Memo ON 04/29/2015] amLODipine (NORVASC) tablet 5 mg  5 mg Oral Daily Hildred Priest, MD      . Derrill Memo ON 04/29/2015] ARIPiprazole (ABILIFY) tablet 30 mg  30 mg Oral Daily Hildred Priest, MD      . ARIPiprazole SUSR 400 mg  400 mg Intramuscular Q28 days Clovis Fredrickson, MD   400 mg at 04/06/15 1429  . clonazePAM (KLONOPIN) tablet 0.5 mg  0.5 mg Oral TID Hildred Priest, MD      . docusate sodium (COLACE) capsule 200 mg  200 mg Oral BID Clovis Fredrickson, MD   200 mg at 04/28/15 0919  . doxepin (SINEQUAN) capsule 150 mg  150 mg Oral  QHS Clovis Fredrickson, MD   150 mg at 04/27/15 2231  . ibuprofen (ADVIL,MOTRIN) tablet 600 mg  600 mg Oral Q6H PRN Gonzella Lex, MD   600 mg at 04/04/15 1444  . magnesium hydroxide (MILK OF MAGNESIA) suspension 30 mL  30 mL Oral Daily PRN Gonzella Lex, MD      . metoprolol succinate (TOPROL-XL) 24 hr tablet 50 mg  50 mg Oral Daily Jolanta B Pucilowska, MD   50 mg at 04/28/15 0920  . polyethylene glycol (MIRALAX / GLYCOLAX) packet 17 g  17 g Oral Daily Jolanta B Pucilowska, MD   17 g at 04/28/15 0919  . testosterone enanthate (DELATESTRYL) injection 200 mg  200 mg Intramuscular Q28 days Clovis Fredrickson, MD   200 mg at 04/06/15 1143  . venlafaxine XR (EFFEXOR-XR) 24 hr capsule 300 mg  300 mg Oral Q breakfast Jolanta B Pucilowska, MD   300 mg at 04/28/15 0920    Lab Results:  No results found for this or any previous visit (from the past 48 hour(s)).  Musculoskeletal: Strength & Muscle Tone: within normal limits Gait & Station: normal Patient leans: N/A  Psychiatric Specialty Exam: Review of Systems  Constitutional: Negative.  Negative for fever, chills, weight  loss, malaise/fatigue and diaphoresis.  HENT: Negative for congestion, ear pain, hearing loss, nosebleeds, sore throat and tinnitus.   Eyes: Negative for blurred vision, double vision, photophobia, pain and redness.       The patient complains of a worsening headache over the past 2 days.  Respiratory: Negative.  Negative for cough, hemoptysis, sputum production, shortness of breath and wheezing.   Cardiovascular: Negative.  Negative for chest pain, palpitations, orthopnea, claudication and leg swelling.  Gastrointestinal: Positive for abdominal pain. Negative for heartburn, nausea, vomiting, diarrhea, constipation and blood in stool.  Genitourinary: Negative.  Negative for dysuria, urgency, frequency and hematuria.  Musculoskeletal: Negative.  Negative for myalgias, back pain, joint pain, falls and neck pain.  Skin: Negative.  Negative for itching and rash.  Neurological: Positive for headaches. Negative for dizziness, tingling, tremors, sensory change, speech change, focal weakness and seizures.  Endo/Heme/Allergies: Negative.  Does not bruise/bleed easily.  Psychiatric/Behavioral: Positive for depression. Negative for suicidal ideas. The patient has insomnia.   All other systems reviewed and are negative.   Blood pressure 104/66, pulse 64, temperature 98.1 F (36.7 C), temperature source Oral, resp. rate 18, height 5\' 7"  (1.702 m), weight 67.132 kg (148 lb), SpO2 98 %.Body mass index is 23.17 kg/(m^2).  General Appearance: Fairly Groomed  Engineer, water::  Poor - The patient would sit with his head down  Speech:  Clear and Coherent  Volume:  Normal  Mood:  Okay  Affect:  Blunt  Thought Process:  Tangential and pre-occupied about physical problems  Orientation:  Full (Time, Place, and Person)  Thought Content:  Today he is pre-occupied and perseverates about his UTI to the extent that it is like a fixed delusion.  Suicidal Thoughts:  No  Homicidal Thoughts:  No  Memory:  Immediate;    Poor Recent;   Poor Remote;   Poor  Judgement:  Poor and Impaired.  Insight:  Lacking  Psychomotor Activity:  Decreased  Concentration:  Poor  Recall:  Poor  Fund of Knowledge:Poor  Language: Poor  Akathisia:  No  Handed:  Right  AIMS (if indicated):     Assets:  Limited.  ADL's:  Intact  Cognition: WNL  Sleep:  Well and rested well.  Treatment Plan Summary: Daily contact with patient to assess and evaluate symptoms and progress in treatment and Medication management will be done. Pt was assured that undersigned will look into his labs and test results and he felt glad.  Mr. Kopper is a 49 year old male with a history of depression and psychosis admitting floridly psychotic and depressed in the context of medication noncompliance and severe social stressors.  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 Continue Abilify for possible psychosis. Dose of Abilify will be increased to 30 mg a day. He has received Abilify maintenna on 11/15. I will discontinue haloperidol 5 mg in the evening as after adding the second antipsychotic we have not noted any benefit.  Anxiety: Patient was taking Xanax prior to admission and apparently there were some concerns about misuse. I will however restart him on benzodiazepines to see if that will decrease some of his somatic concerns. I will start Klonopin 0.5 mg 3 times a day.  Hypertension: Continue Toprol-XL 50 mg a day. Today I will decrease his amlodipine from 7.5-5 mg a day as his blood pressure has been slightly low.  Constipation. It is unclear if this is delusional. Seems like constant complaint. The patient is on bowel regimen. He reports feeling less constipated with improved appetite.  Continue Colace 200 mg by mouth twice a day and MiraLAX daily.  Insomnia: Patient continues to stay his not sleeping however he isn't sleeping between 6  and 7 hours per nursing staff. I will continue current regimen for insomnia which included doxepin 150 mg daily at bedtime.    Urinary tract infection. The patient has a history of prostatitis he believes that he needs to be on antibiotic. Urinalysis is not suggestive of urinary tract infection, urine culture is negative. No urinary complaints  Repeat UA was negative. Pt was again explained and re-assured about the same.  Metabolic syndrome. Lipid panel is not elevated. TSH normal.   Headaches: This is an ongoing concern of of this patient. He says that his headaches are debilitating. Head CT was negative. I will order an MRI to the brain with and without contrast today.  ADLs. Occupational therapy consult was completed. it appears that low energy and tiredness are major obstacles.   Cognitive decline. Psychological consultation completed and did not detect gross cognitive difficulties. Please see results.This appears to be his baseline.   Hypogonadism. We ordered testosterone injections.   ECT: h was referred to [facility] for possible ECT treatment.  Disposition. To be established. He is homeless and uninsured, and without any support. ACSW will explore availability of resources in the Community.Continue current meds and constant re-direction provided and re-assurance provided.    Hildred Priest 04/28/2015, 3:00 PM

## 2015-04-28 NOTE — Plan of Care (Signed)
Problem: Alteration in mood; excessive anxiety as evidenced by: Goal: STG-Pt will report an absence of self-harm thoughts/actions (Patient will report an absence of self-harm thoughts or actions)  Outcome: Progressing No SI/HI at this time. Refuses to attend therapy groups to learn and initiate coping skills.

## 2015-04-28 NOTE — Progress Notes (Signed)
D: Pt in room upon approach. Affect appears sad and anxious. Pt continues to be preoccupied with head injury and somatic symptoms. Reports passive SI. Pt does contract for safety. Denies HI/AVH. Pt complains of headache rated 9 out of 10. Pt states "something's wrong, I just don't know how to express it." Pt is seen talking on the phone most of the evening during phone times. A: Emotional support and encouragement provided. PRN tylenol given. Medications given as prescribed. q15 minute safety checks maintained. R: Pt remains free from harm.

## 2015-04-28 NOTE — Progress Notes (Signed)
Recreation Therapy Notes  Date: 12.07.16 Time: 3:00 pm Location: Craft Room  Group Topic: Self-esteem  Goal Area(s) Addresses:  Patient will be able to identify benefit of self-esteem. Patient will be able to identify ways to increase self-esteem.  Behavioral Response: Did not attend  Intervention: Self-Protrait  Activity: Patients were given blank face worksheets and instructed to draw their self-portrait of how they were feeling. Patients were then given construction paper and instructed to write their first name and something positive about themselves. Patients passed the papers around group to write positive traits about peers. Patients drew their self-portrait again after reading the positive comments from their peers.  Education: LRT educated patients on additional ways to increase their self-esteem.  Education Outcome: Patient did not attend group.   Clinical Observations/Feedback: Patient did not attend group.  Leonette Monarch, LRT/CTRS 04/28/2015 4:21 PM

## 2015-04-28 NOTE — Progress Notes (Signed)
MRI order noted. MRI phone numbers called. No response. Patient aware.

## 2015-04-28 NOTE — B[facility] Group Notes (Signed)
[facility] LCSW Aftercare Discharge Planning Group Note   04/28/2015 3:49 PM  Participation Quality:  Did not attend.   [place] MSW, LCSWA

## 2015-04-28 NOTE — [facility] Group Notes (Signed)
[place] Group Notes:  (Nursing/MHT/Case Management/Adjunct)  Date:  04/28/2015  Time:  12:51 PM  Type of Therapy:  Psychoeducational Skills  Participation Level:  Did Not Attend  Adela Lank [facility] 04/28/2015, 12:51 PM

## 2015-04-28 NOTE — Plan of Care (Addendum)
Problem: Ineffective individual coping Goal: STG: Patient will remain free from self harm Outcome: Progressing Pt remains free from harm  Problem: Alteration in mood; excessive anxiety as evidenced by: Goal: LTG-Patient's behavior demonstrates decreased anxiety (Patient's behavior demonstrates anxiety and he/she is utilizing learned coping skills to deal with anxiety-producing situations)  Outcome: Not Progressing Pt demonstrates increased anxiety. Continues to be preoccupied with head injury Goal: STG-Pt will report an absence of self-harm thoughts/actions (Patient will report an absence of self-harm thoughts or actions)  Outcome: Not Progressing Pt reports passive SI  Problem: Alteration in thought process Goal: STG-Patient is able to discuss thoughts with staff Outcome: Progressing Pt discusses concerns with staff

## 2015-04-29 ENCOUNTER — Inpatient Hospital Stay: Payer: No Typology Code available for payment source

## 2015-04-29 DIAGNOSIS — F450 Somatization disorder: Secondary | ICD-10-CM

## 2015-04-29 DIAGNOSIS — F333 Major depressive disorder, recurrent, severe with psychotic symptoms: Secondary | ICD-10-CM

## 2015-04-29 LAB — CBC
HEMATOCRIT: 42.2 % (ref 40.0–52.0)
Hemoglobin: 14.5 g/dL (ref 13.0–18.0)
MCH: 30.1 pg (ref 26.0–34.0)
MCHC: 34.3 g/dL (ref 32.0–36.0)
MCV: 87.9 fL (ref 80.0–100.0)
PLATELETS: 321 10*3/uL (ref 150–440)
RBC: 4.81 MIL/uL (ref 4.40–5.90)
RDW: 13.7 % (ref 11.5–14.5)
WBC: 6.8 10*3/uL (ref 3.8–10.6)

## 2015-04-29 LAB — COMPREHENSIVE METABOLIC PANEL
ALK PHOS: 61 U/L (ref 38–126)
ALT: 14 U/L — AB (ref 17–63)
AST: 16 U/L (ref 15–41)
Albumin: 4.3 g/dL (ref 3.5–5.0)
Anion gap: 10 (ref 5–15)
BILIRUBIN TOTAL: 0.8 mg/dL (ref 0.3–1.2)
BUN: 9 mg/dL (ref 6–20)
CALCIUM: 9.6 mg/dL (ref 8.9–10.3)
CHLORIDE: 98 mmol/L — AB (ref 101–111)
CO2: 27 mmol/L (ref 22–32)
CREATININE: 0.81 mg/dL (ref 0.61–1.24)
Glucose, Bld: 147 mg/dL — ABNORMAL HIGH (ref 65–99)
Potassium: 4.1 mmol/L (ref 3.5–5.1)
Sodium: 135 mmol/L (ref 135–145)
TOTAL PROTEIN: 7.1 g/dL (ref 6.5–8.1)

## 2015-04-29 LAB — VITAMIN B12: Vitamin B-12: 221 pg/mL (ref 180–914)

## 2015-04-29 MED ORDER — TEMAZEPAM 15 MG PO CAPS
15.0000 mg | ORAL_CAPSULE | Freq: Every day | ORAL | Status: DC
  Administered 2015-04-30 – 2015-05-04 (×6): 15 mg via ORAL
  Filled 2015-04-29 (×6): qty 1

## 2015-04-29 MED ORDER — GADOBENATE DIMEGLUMINE 529 MG/ML IV SOLN
15.0000 mL | Freq: Once | INTRAVENOUS | Status: AC | PRN
Start: 2015-04-29 — End: 2015-04-29
  Administered 2015-04-29: 14 mL via INTRAVENOUS

## 2015-04-29 MED ORDER — SENNA 8.6 MG PO TABS
2.0000 | ORAL_TABLET | Freq: Every day | ORAL | Status: DC
  Administered 2015-04-30 – 2015-05-04 (×6): 17.2 mg via ORAL
  Filled 2015-04-29 (×6): qty 2

## 2015-04-29 MED ORDER — QUETIAPINE FUMARATE 100 MG PO TABS
100.0000 mg | ORAL_TABLET | Freq: Every day | ORAL | Status: DC
  Administered 2015-04-30: 100 mg via ORAL
  Filled 2015-04-29: qty 1

## 2015-04-29 NOTE — Plan of Care (Signed)
Problem: Alteration in thought process Goal: LTG-Patient behavior demonstrates decreased signs psychosis (Patient behavior demonstrates decreased signs of psychosis to the point the patient is safe to return home and continue treatment in an outpatient setting.)  Outcome: Not Progressing Patient remains fixated on abdomen and constantly stating there is a blockage, but will not take His laxatives, He states that laxatives are not going to help and that it is not stool in there, and that it is a mass, Patient states that His stool is absorbing and that He might be urinating it out, nurse tried to redirect, but Patient somatic, attempted to get him to come out of the room, but He would not.

## 2015-04-29 NOTE — Progress Notes (Signed)
[facility] MD Progress Note  04/29/2015 1:20 PM Surya.K.Challa MRN:  [id number]  Subjective Pt continues to have multiple somatic complaints and it is a new one every day. Yesterday and today the patient has been focused on having a tumor in his abdomen. I completed a physical examination of his abdomen and he was completely normal, the patient was reassured that he kept insisting that there was a tumor.  Patient says he is not going to groups and getting out of his room because of severe debilitating migraines. He says that the migraines are so severe he is unable to walk.  He complains of inability to sleep and constipation. The patient complains of constipation but has been refusing to take his MiraLAX .  An MRI of the brain was ordered yesterday as the patient says he had a head injury several months ago. But now he is requesting to have an MRI of his abdomen.  Subjective benefit has been obtained from current pharmacological interventions. I plan to discontinue Abilify 5 and instead try Seroquel as this medication can target insomnia, anxiety and mood.  Patient will be restarted on 100 mg tonight. I will discontinue doxepin as patient is currently taking 300 mg of Effexor in combination with 150 mg of doxepin, I worry about side effects from this combination and the increased risk for serotonin syndrome.  Patient refuses clonazepam which was started yesterday I we'll discontinue this medication. He is requesting Restoril for sleep. I will start him on 15 mg daily at bedtime  He has been compliant with psychotropic medications and denies any physical adverse side effects patient with those medications. He denies any auditory or visual hallucinations. He denies any paranoid thoughts or delusions. He says his mood has been depressed and he does endorse some anhedonia.  He denies any current active or passive suicidal thoughts or psychotic symptoms. He has been eating well. Per nursing he slept well and rested  well.  Parents that he is slept 3 hours  Per nursing: Patient alert and oriented, patient complains of abd. Pain, states that " I have tumor in belly, nothing will go thru, patient states that He cannot take His miralax, refused states that I cannot drink that" patient did take His effexor, Patient is safe, denies Si/HI/AVH, , patient is calm, but fixated on belly discomfort and tumor. Nurse will continue to monitor.   Past psychiatric history. As above he carries multiple diagnoses. He was hospitalized at [facility] at least twice before. The patient is not a good historian and unable to give me any details of his medication history. She denies ever attempting suicide. Upon further questioning the patient has all his problems started in October 2015 when he suffered severe head injury that led to depression, mood instability, cognitive decline. He used to be evaluated regarding his high school but now has difficulties remembering everyday stuff. He was diagnosed with dementia praecox. He also believes that all his somatic problems constipation and urinary tract infection and insomnia targeted at the same time.  Family psychiatric history. He denies any history of any mental illness or substance use in the family.  Social history. He was married twice but now divorced. He has two children age 67 and 74. He did well in school. He used to work as a Electronics engineer. He became dysfunctional following head injury. He is homeless, uninsured, with no social support. He applied for disability but was turned down and called a Risk manager.  Medical history: He  has a long-standing history of multiple medical complaints most of which are never found to be diagnosable. He is always complaining of chronic severe constipation and trouble sleeping. Possible high blood pressure for real but no other known medical problems.  Substance abuse history: Patient says he is not drinking and does not use drugs. He  says he dabbled in the years ago but patient's gait completely off of them now for years  Total Time spent with patient: 30 minutes  Principal Problem: Major depressive disorder, recurrent episode, severe, with psychosis ([place]) Diagnosis:   Patient Active Problem List   Diagnosis Date Noted  . Major depressive disorder, recurrent episode, severe, with psychosis ([place]) [F33.3] 03/30/2015  . Chronic constipation [K59.00] 03/29/2015  . 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 Cervical pain [M54.2] 01/18/2015  . Disorder of ejaculation [N53.19] 11/22/2014  . 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 GAD (generalized anxiety disorder) [F41.1] 10/19/2014  . Benign essential HTN [I10] 08/27/2014  . 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  Past Medical History:  Past Medical History  Diagnosis Date  . Hypertension   . Rectal fistula   . Dysuria   . Diverticulosis   . Gastroparesis   . Borderline personality disorder   . Major depression ([place])   . Schizophrenia ([place])   . Hypokalemia   . Microscopic hematuria   . External hemorrhoid   . Over weight   . Nicotine addiction   . Difficulty urinating   . Stroke ([place])   . Benign essential HTN 08/27/2014  . BP (high blood pressure) 01/16/2015  . Anxiety     Past Surgical History  Procedure Laterality Date  . Lumbar spine surgery    . Colonoscopy    . Colonoscopy with propofol N/A 10/12/2014    Procedure: COLONOSCOPY WITH PROPOFOL;  Surgeon: Hulen Luster, MD;  Location: [facility] ENDOSCOPY;  Service: Gastroenterology;  Laterality: N/A;  . Tonsillectomy    . Back surgery     Family History:  Family History  Problem Relation Age of Onset  . Stroke Mother   . Stroke Maternal Grandmother   . Diabetes Mellitus II Sister    Family Psychiatric  History:  None reported. Social History:  History  Alcohol Use No    Comment: last drink was in march 2016     History  Drug Use No    Social History   Social History  . Marital Status: Single    Spouse Name: N/A  . Number of Children: N/A  . Years of Education: N/A   Social History Main Topics  . Smoking status: Former Smoker    Types: Cigarettes    Quit date: 08/19/2014  . Smokeless tobacco: None  . Alcohol Use: No     Comment: last drink was in march 2016  . Drug Use: No  . Sexual Activity: Not Asked   Other Topics Concern  . None   Social History Narrative    Sleep: Good  Appetite:  Fair  Current Medications: Current Facility-Administered Medications  Medication Dose Route Frequency Provider Last Rate Last Dose  . acetaminophen (TYLENOL) tablet 650 mg  650 mg Oral Q6H PRN Gonzella Lex, MD   650 mg at 04/28/15 2144  . alum & mag hydroxide-simeth (MAALOX/MYLANTA) 200-200-20 MG/5ML suspension 30 mL  30 mL Oral Q4H PRN Gonzella Lex,  MD      . amLODipine (NORVASC) tablet 5 mg  5 mg Oral Daily Hildred Priest, MD   5 mg at 04/29/15 1032  . docusate sodium (COLACE) capsule 200 mg  200 mg Oral BID Clovis Fredrickson, MD   200 mg at 04/28/15 0919  . ibuprofen (ADVIL,MOTRIN) tablet 600 mg  600 mg Oral Q6H PRN Gonzella Lex, MD   600 mg at 04/04/15 1444  . magnesium hydroxide (MILK OF MAGNESIA) suspension 30 mL  30 mL Oral Daily PRN Gonzella Lex, MD      . metoprolol succinate (TOPROL-XL) 24 hr tablet 50 mg  50 mg Oral Daily Jolanta B Pucilowska, MD   50 mg at 04/29/15 1032  . QUEtiapine (SEROQUEL) tablet 100 mg  100 mg Oral QHS Hildred Priest, MD      . senna (SENOKOT) tablet 17.2 mg  2 tablet Oral QHS Hildred Priest, MD      . temazepam (RESTORIL) capsule 15 mg  15 mg Oral QHS Hildred Priest, MD      . testosterone enanthate (DELATESTRYL) injection 200 mg  200 mg Intramuscular Q28 days Clovis Fredrickson, MD   200 mg at 04/06/15  1143  . venlafaxine XR (EFFEXOR-XR) 24 hr capsule 300 mg  300 mg Oral Q breakfast Jolanta B Pucilowska, MD   300 mg at 04/29/15 [id number]    Lab Results:  No results found for this or any previous visit (from the past 48 hour(s)).  Musculoskeletal: Strength & Muscle Tone: within normal limits Gait & Station: normal Patient leans: N/A  Psychiatric Specialty Exam: Review of Systems  Constitutional: Negative.  Negative for fever, chills, weight loss, malaise/fatigue and diaphoresis.  HENT: Negative for congestion, ear pain, hearing loss, nosebleeds, sore throat and tinnitus.   Eyes: Negative for blurred vision, double vision, photophobia, pain and redness.       The patient complains of a worsening headache over the past 2 days.  Respiratory: Negative.  Negative for cough, hemoptysis, sputum production, shortness of breath and wheezing.   Cardiovascular: Negative.  Negative for chest pain, palpitations, orthopnea, claudication and leg swelling.  Gastrointestinal: Negative for heartburn, nausea, vomiting, abdominal pain, diarrhea, constipation and blood in stool.       C/o having a tumor in his abdomen  Genitourinary: Negative.  Negative for dysuria, urgency, frequency and hematuria.  Musculoskeletal: Negative.  Negative for myalgias, back pain, joint pain, falls and neck pain.  Skin: Negative.  Negative for itching and rash.  Neurological: Positive for headaches. Negative for dizziness, tingling, tremors, sensory change, speech change, focal weakness and seizures.  Endo/Heme/Allergies: Negative.  Does not bruise/bleed easily.  Psychiatric/Behavioral: Positive for depression. Negative for suicidal ideas. The patient has insomnia.   All other systems reviewed and are negative.   Blood pressure 113/77, pulse 80, temperature 98.2 F (36.8 C), temperature source Oral, resp. rate 18, height 5\' 7"  (1.702 m), weight 67.132 kg (148 lb), SpO2 98 %.Body mass index is 23.17 kg/(m^2).  General Appearance:  Fairly Groomed  Engineer, water::  Good  Speech:  Clear and Coherent  Volume:  Normal  Mood:  Okay  Affect:  Blunt  Thought Process:  Tangential and pre-occupied about physical problems  Orientation:  Full (Time, Place, and Person)  Thought Content:  Today he is pre-occupied and perseverates about his UTI to the extent that it is like a fixed delusion.  Suicidal Thoughts:  No  Homicidal Thoughts:  No  Memory:  Immediate;   Poor  Recent;   Poor Remote;   Poor  Judgement:  Poor and Impaired.  Insight:  Lacking  Psychomotor Activity:  Decreased  Concentration:  Poor  Recall:  Poor  Fund of Knowledge:Poor  Language: Poor  Akathisia:  No  Handed:  Right  AIMS (if indicated):     Assets:  Limited.  ADL's:  Intact  Cognition: WNL  Sleep: 3h   Treatment Plan Summary: Daily contact with patient to assess and evaluate symptoms and progress in treatment and Medication management will be done. Pt was assured that undersigned will look into his labs and test results and he felt glad.  Mr. Torain is a 49 year old male with a history of depression and psychosis admitting floridly psychotic and depressed in the context of medication noncompliance and severe social stressors.  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 I we'll discontinue Abilify due to its lack of benefit. Instead I will start the patient on Seroquel 100 mg by mouth daily at bedtime with the plan of titrating this medication up.    Anxiety: Patient has documented history of anxiolytic dependence. He refused Klonopin yesterday.  Hypertension: Continue Toprol-XL 50 mg a day.  Continue also on Norvasc 5 mg a day  Constipation. It is unclear if this is delusional. Seems like constant complaint. I will continue the patient on Colace 200 mg a day, I will discontinue MiraLAX as he has not been taking it and instead I will order  Senokot 2 tablets at bedtime.  Insomnia: I will d/c doxepin as he is also on high dose effexor. I worry about SE and possible serotonin syndrome.   Instead will use restoril 15 mg qhs.  Urinary tract infection. The patient has a history of prostatitis he believes that he needs to be on antibiotic. Urinalysis is not suggestive of urinary tract infection, urine culture is negative. No urinary complaints  Repeat UA was negative. Pt was again explained and re-assured about the same.  Metabolic syndrome. Lipid panel is not elevated. TSH normal.   Headaches: This is an ongoing concern of of this patient. He says that his headaches are debilitating. Head CT was negative. I will order an MRI to the brain with and without contrast today.  ADLs. Occupational therapy consult was completed. it appears that low energy and tiredness are major obstacles.   Cognitive decline. Psychological consultation completed and did not detect gross cognitive difficulties. Please see results.This appears to be his baseline.   Hypogonadism. We ordered testosterone injections.   ECT: [facility].  Disposition. To be established. He is homeless and uninsured, and without any support. ACSW will explore availability of resources in the Community.Continue current meds and constant re-direction provided and re-assurance provided.    Hildred Priest 04/29/2015, 1:20 PM

## 2015-04-29 NOTE — BHH Group Notes (Signed)
[facility] LCSW Group Therapy  04/29/2015 1:51 PM  Type of Therapy:  Group Therapy  Participation Level:  Did Not Attend   Keene Breath, MSW, LCSWA 04/29/2015, 1:51 PM

## 2015-04-29 NOTE — Progress Notes (Signed)
Patient remains fixated on stomach being bloated and having blockage. Patient states now that His bones are involved and are brittle. Patient encouraged to come out of room today at times, but patient refused. Patient denies Si/HI/ or avh.

## 2015-04-29 NOTE — BHH Group Notes (Signed)
[facility] LCSW Group Therapy  04/29/2015 11:16 AM (Late entry for 04/28/2015)   Type of Therapy:  Group Therapy  Participation Level:  Did Not Attend  Modes of Intervention:  Discussion, Education, Socialization and Support  Summary of Progress/Problems: Balance in life: Patients will discuss the concept of balance and how it looks and feels to be unbalanced. Pt will identify areas in their life that is unbalanced and ways to become more balanced.    [place] MSW, [place]  04/29/2015, 11:16 AM

## 2015-04-29 NOTE — Tx Team (Signed)
Interdisciplinary Treatment Plan Update (Adult)  Date:  04/29/2015 Time Reviewed:  1:52 PM  Progress in Treatment: Attending groups: No. Participating in groups:  No. Taking medication as prescribed:  Yes. Tolerating medication:  Yes. Family/Significant othe contact made:  Yes, individual(s) contacted:  [name] (ex-wife) [phone] Patient understands diagnosis:  No. Discussing patient identified problems/goals with staff:  Yes. Medical problems stabilized or resolved:  Yes. Denies suicidal/homicidal ideation: Yes. Issues/concerns per patient self-inventory:  Yes. Other:  New problem(s) identified: No, Describe:  NA  Discharge Plan or Barriers: Pt is homeless and was last living with his sister and does not want to go to a shelter. Patient has no income, no employment, no disability, and no insurance. Patient reports that he has dementia and possibly cancer and other somatic symptoms. Patient may be delusional and has a Care Coordinator for support. Patient ex-wife is supportive and explains that she has no options for housing to offer but states that patient truly has no supportive family members.  Reason for Continuation of Hospitalization: Delusions  Medication stabilization  Suicidal Ideations   Comments: Patient remains delusional and paranoid. Patient reports somatic symptoms and believes he may have cancer or dementia and not long to live. Patient is scheduled for MRI. Patient reports change from Vistaril to Klonopin is not preferred and wants to be changed back to Vistaril for sleep.   Estimated length of stay: up to 7 days   New goal(s):  Review of initial/current patient goals per problem list:   1.  Goal(s): Patient will participate in aftercare plan * Met: No * Target date: at discharge * As evidenced by: Patient will participate within aftercare plan AEB aftercare provider and housing plan at discharge being identified.   2.  Goal (s): Patient will exhibit  decreased depressive symptoms and suicidal ideations. * Met: no *  Target date: at discharge * As evidenced by: Patient will utilize self rating of depression at 3 or below and demonstrate decreased signs of depression or be deemed stable for discharge by MD.  2.  Goal (s): Patient will demonstrate decreased symptoms of psychosis. * Met: No  *  Target date: at discharge * As evidenced by: Patient will not endorse signs of psychosis or be deemed stable for discharge by MD. *   Attendees: Physician:   Alethia Berthold, MD  12/8/20161:52 PM  Nursing:  Alden Server, RN  12/8/20161:52 PM  Other:  Fransisca Connors 12/8/20161:52 PM  Other: Tilden Fossa, LCSWA 12/8/20161:52 PM  Other: Marylou Flesher, LCSWA 12/8/20161:52 PM  Other:   12/8/20161:52 PM   Scribe for Treatment Team:   Keene Breath, MSW, LCSWA  04/29/2015, 1:52 PM

## 2015-04-29 NOTE — Progress Notes (Signed)
Patient alert and oriented, patient complains of abd. Pain, states that " I have tumor in belly, nothing will go thru, patient states that He cannot take His miralax, refused states that I cannot drink that" patient did take His effexor, Patient is safe, denies Si/HI/AVH, , patient is calm, but fixated on belly discomfort and tumor. Nurse will continue to monitor.

## 2015-04-29 NOTE — BHH Group Notes (Signed)
[facility] Group Notes:  (Nursing/MHT/Case Management/Adjunct)  Date:  04/29/2015  Time:  2:12 PM  Type of Therapy:  Psychoeducational Skills  Participation Level:  Did Not Attend  Jeremiah [name] 04/29/2015, 2:12 PM

## 2015-04-29 NOTE — Progress Notes (Signed)
Recreation Therapy Notes  Date: 12.08.16 Time: 3:00 pm Location: Craft Room  Group Topic: Leisure Education/Coping Skills  Goal Area(s) Addresses:  Patient will identify things they are grateful for. Patient will identify how being grateful can influence decision making.  Behavioral Response: Did not attend  Intervention: Grateful Wheel  Activity: Patients were given an I Am Grateful For worksheet and instructed to list thing they are grateful for under each category.  Education: LRT educated patient on why it is important to think of thing they are grateful for.  Education Outcome: Patient did not attend group.   Clinical Observations/Feedback: Patient did not attend group.  Leonette Monarch, LRT/CTRS 04/29/2015 5:17 PM

## 2015-04-29 NOTE — Progress Notes (Signed)
D: Pt in room upon approach. Reports that he has been laying in bed most of the day. Pt denies SI/HI/AVH at this time, but states "I will be seeing or hearing things soon enough". When asked to elaborate he replies that he "feels as though things are getting worse." Pt c/o pelvic pain rated a 10 out of 10. Pt continues to verbalize somatic complaints regarding an abdominal tumor. Pt refused evening dose of Klonopin and Colace despite RN encouragement. States that he has trouble sleeping at night and the Klonopin "doesn't do nothing to me." A: Emotional support and encouragement provided. PRN Tylenol given upon request. q15 minute safety checks maintained. R: Pt remains free from harm.

## 2015-04-29 NOTE — BHH Group Notes (Signed)
[facility] Group Notes:  (Nursing/MHT/Case Management/Adjunct)  Date:  04/29/2015  Time:  9:53 PM  Type of Therapy:  Evening Wrap-up Group  Participation Level:  Did Not Attend  Participation Quality:  N/A  Affect:  N/A  Cognitive:  N/A  Insight:  None  Engagement in Group:  N/A  Modes of Intervention:  Discussion  Summary of Progress/Problems:  Levonne Spiller 04/29/2015, 9:53 PM

## 2015-04-29 NOTE — Plan of Care (Signed)
Problem: Ineffective individual coping Goal: STG: Pt will be able to identify effective and ineffective STG: Pt will be able to identify effective and ineffective coping patterns  Outcome: Not Progressing Pt refuses to go to group and learn coping skills Goal: STG: Patient will remain free from self harm Outcome: Progressing Pt remains free from harm  Problem: Alteration in thought process Goal: STG-Patient is able to discuss thoughts with staff Outcome: Progressing Pt discusses concerns and feelings with staff

## 2015-04-30 DIAGNOSIS — E538 Deficiency of other specified B group vitamins: Secondary | ICD-10-CM

## 2015-04-30 LAB — HIV ANTIBODY (ROUTINE TESTING W REFLEX): HIV Screen 4th Generation wRfx: NONREACTIVE

## 2015-04-30 LAB — RPR: RPR Ser Ql: NONREACTIVE

## 2015-04-30 MED ORDER — CYANOCOBALAMIN 1000 MCG/ML IJ SOLN
1000.0000 ug | Freq: Every day | INTRAMUSCULAR | Status: DC
  Administered 2015-04-30 – 2015-05-05 (×6): 1000 ug via INTRAMUSCULAR
  Filled 2015-04-30 (×6): qty 1

## 2015-04-30 MED ORDER — QUETIAPINE FUMARATE 200 MG PO TABS
200.0000 mg | ORAL_TABLET | Freq: Every day | ORAL | Status: DC
  Administered 2015-04-30 – 2015-05-03 (×4): 200 mg via ORAL
  Filled 2015-04-30 (×5): qty 1

## 2015-04-30 NOTE — Progress Notes (Addendum)
[facility] MD Progress Note  04/30/2015 12:51 PM Surya.K.Challa MRN:  [id number]  Subjective Pt continues to have multiple somatic complaints and it is a new one every day. This week the patient has been focused on having a tumor in his abdomen. I completed a physical examination of his abdomen and it was completely normal 12/08, the patient was reassured but he kept insisting that there has a tumor.  Patient says he is not going to groups and getting out of his room because of severe debilitating migraines. He says that the migraines are so severe he is unable to walk.  He complains of inability to sleep and constipation. The patient complains of constipation but has been refusing to take his MiraLAX .  An MRI of the brain was completed yesterday and it was negative. I also ordered HIV and RPR which are non reactive. B12 is 200 low range normal.  He has been compliant with psychotropic medications and denies any physical adverse side effects patient with those medications. He denies any auditory or visual hallucinations. He denies any paranoid thoughts or delusions. He says his mood has been depressed and he does endorse some anhedonia.  He denies any current active or passive suicidal thoughts or psychotic symptoms. He has been eating well.   Parents that he is slept 4 hours  Per nursing: D: Patient stated slept good last night .Stated appetite is good and energy level Is normal. Stated concentration is good . Stated on Depression scale 10 , hopeless 10 and anxiety 9.( low 0-10 high) Denies suicidal homicidal ideations . Continue to express new somatic complaints . Appropriate ADL'S. Interacting with peers and staff.  A: Encourage patient participation with unit programming . Instruction Given on Medication , verbalize understanding. R: Voice no other concerns. Staff continue to monitor   Past psychiatric history. As above he carries multiple diagnoses. He was hospitalized at [facility] at least  twice before. The patient is not a good historian and unable to give me any details of his medication history. She denies ever attempting suicide. Upon further questioning the patient has all his problems started in October 2015 when he suffered severe head injury that led to depression, mood instability, cognitive decline. He used to be evaluated regarding his high school but now has difficulties remembering everyday stuff. He was diagnosed with dementia praecox. He also believes that all his somatic problems constipation and urinary tract infection and insomnia targeted at the same time.  Family psychiatric history. He denies any history of any mental illness or substance use in the family.  Social history. He was married twice but now divorced. He has two children age 75 and 59. He did well in school. He used to work as a Electronics engineer. He became dysfunctional following head injury. He is homeless, uninsured, with no social support. He applied for disability but was turned down and called a Risk manager.  Medical history: He has a long-standing history of multiple medical complaints most of which are never found to be diagnosable. He is always complaining of chronic severe constipation and trouble sleeping. Possible high blood pressure for real but no other known medical problems.  Substance abuse history: Patient says he is not drinking and does not use drugs. He says he dabbled in the years ago but patient's gait completely off of them now for years  Total Time spent with patient: 30 minutes  Principal Problem: Major depressive disorder, recurrent episode, severe, with psychosis ([place]) Diagnosis:  Patient Active Problem List   Diagnosis Date Noted  . Vitamin B12 deficiency [E53.8] 04/30/2015  . 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 Major depressive disorder, recurrent episode, severe, with psychosis ([place]) [F33.3] 04/29/2015  . Cervical pain [M54.2] 01/18/2015  . 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 GAD (generalized anxiety disorder) [F41.1] 10/19/2014  . Benign essential HTN [I10] 08/27/2014  . 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  Past Medical History:  Past Medical History  Diagnosis Date  . Hypertension   . Rectal fistula   . Dysuria   . Diverticulosis   . Gastroparesis   . Borderline personality disorder   . Major depression ([place])   . Schizophrenia ([place])   . Hypokalemia   . Microscopic hematuria   . External hemorrhoid   . Over weight   . Nicotine addiction   . Difficulty urinating   . Stroke ([place])   . Benign essential HTN 08/27/2014  . BP (high blood pressure) 01/16/2015  . Anxiety     Past Surgical History  Procedure Laterality Date  . Lumbar spine surgery    . Colonoscopy    . Colonoscopy with propofol N/A 10/12/2014    Procedure: COLONOSCOPY WITH PROPOFOL;  Surgeon: Hulen Luster, MD;  Location: [facility] ENDOSCOPY;  Service: Gastroenterology;  Laterality: N/A;  . Tonsillectomy    . Back surgery     Family History:  Family History  Problem Relation Age of Onset  . Stroke Mother   . Stroke Maternal Grandmother   . Diabetes Mellitus II Sister    Family Psychiatric  History: None reported. Social History:  History  Alcohol Use No    Comment: last drink was in march 2016     History  Drug Use No    Social History   Social History  . Marital Status: Single    Spouse Name: N/A  . Number of Children: N/A  . Years of Education: N/A   Social History Main Topics  . Smoking status: Former Smoker    Types: Cigarettes    Quit date: 08/19/2014  . Smokeless tobacco: None  . Alcohol Use: No     Comment: last drink was in march 2016  . Drug Use: No  . Sexual Activity: Not Asked   Other Topics Concern  . None   Social History Narrative    Sleep: Poor  Appetite:  Fair  Current  Medications: Current Facility-Administered Medications  Medication Dose Route Frequency Provider Last Rate Last Dose  . acetaminophen (TYLENOL) tablet 650 mg  650 mg Oral Q6H PRN Gonzella Lex, MD   650 mg at 04/28/15 2144  . alum & mag hydroxide-simeth (MAALOX/MYLANTA) 200-200-20 MG/5ML suspension 30 mL  30 mL Oral Q4H PRN Gonzella Lex, MD      . amLODipine (NORVASC) tablet 5 mg  5 mg Oral Daily Hildred Priest, MD   5 mg at 04/30/15 0807  . cyanocobalamin ((VITAMIN B-12)) injection 1,000 mcg  1,000 mcg Intramuscular Daily Hildred Priest, MD   1,000 mcg at 04/30/15 1151  . docusate sodium (COLACE) capsule 200 mg  200 mg Oral BID Clovis Fredrickson, MD   200 mg at 04/30/15 0807  . ibuprofen (ADVIL,MOTRIN) tablet 600 mg  600 mg Oral Q6H PRN Gonzella Lex, MD   600 mg at 04/04/15 1444  . magnesium hydroxide (MILK OF MAGNESIA) suspension 30 mL  30  mL Oral Daily PRN Gonzella Lex, MD      . metoprolol succinate (TOPROL-XL) 24 hr tablet 50 mg  50 mg Oral Daily Clovis Fredrickson, MD   50 mg at 04/30/15 0807  . QUEtiapine (SEROQUEL) tablet 200 mg  200 mg Oral QHS Hildred Priest, MD      . senna (SENOKOT) tablet 17.2 mg  2 tablet Oral QHS Hildred Priest, MD   17.2 mg at 04/30/15 0023  . temazepam (RESTORIL) capsule 15 mg  15 mg Oral QHS Hildred Priest, MD   15 mg at 04/30/15 0022  . testosterone enanthate (DELATESTRYL) injection 200 mg  200 mg Intramuscular Q28 days Clovis Fredrickson, MD   200 mg at 04/06/15 1143  . venlafaxine XR (EFFEXOR-XR) 24 hr capsule 300 mg  300 mg Oral Q breakfast Jolanta B Pucilowska, MD   300 mg at 04/30/15 1423    Lab Results:  Results for orders placed or performed during the hospital encounter of 03/30/15 (from the past 48 hour(s))  HIV antibody     Status: None   Collection Time: 04/29/15  1:45 PM  Result Value Ref Range   HIV Screen 4th Generation wRfx Non Reactive Non Reactive    Comment:  (NOTE) Performed At: [facility] [place], [place] [id number] Lindon Romp MD [id number]   RPR     Status: None   Collection Time: 04/29/15  1:45 PM  Result Value Ref Range   RPR Ser Ql Non Reactive Non Reactive    Comment: (NOTE) Performed At: [facility] [place], [place] [id number] Lindon Romp MD [id number]   Vitamin B12     Status: None   Collection Time: 04/29/15  1:45 PM  Result Value Ref Range   Vitamin B-12 221 180 - 914 pg/mL    Comment: (NOTE) This assay is not validated for testing neonatal or myeloproliferative syndrome specimens for Vitamin B12 levels. Performed at [facility]   CBC     Status: None   Collection Time: 04/29/15  1:45 PM  Result Value Ref Range   WBC 6.8 3.8 - 10.6 K/uL   RBC 4.81 4.40 - 5.90 MIL/uL   Hemoglobin 14.5 13.0 - 18.0 g/dL   HCT 42.2 40.0 - 52.0 %   MCV 87.9 80.0 - 100.0 fL   MCH 30.1 26.0 - 34.0 pg   MCHC 34.3 32.0 - 36.0 g/dL   RDW 13.7 11.5 - 14.5 %   Platelets 321 150 - 440 K/uL  Comprehensive metabolic panel     Status: Abnormal   Collection Time: 04/29/15  1:45 PM  Result Value Ref Range   Sodium 135 135 - 145 mmol/L   Potassium 4.1 3.5 - 5.1 mmol/L   Chloride 98 (L) 101 - 111 mmol/L   CO2 27 22 - 32 mmol/L   Glucose, Bld 147 (H) 65 - 99 mg/dL   BUN 9 6 - 20 mg/dL   Creatinine, Ser 0.81 0.61 - 1.24 mg/dL   Calcium 9.6 8.9 - 10.3 mg/dL   Total Protein 7.1 6.5 - 8.1 g/dL   Albumin 4.3 3.5 - 5.0 g/dL   AST 16 15 - 41 U/L   ALT 14 (L) 17 - 63 U/L   Alkaline Phosphatase 61 38 - 126 U/L   Total Bilirubin 0.8 0.3 - 1.2 mg/dL   GFR calc non Af Amer >60 >60 mL/min   GFR calc Af Amer >60 >60 mL/min  Comment: (NOTE) The eGFR has been calculated using the CKD EPI equation. This calculation has not been validated in all clinical situations. eGFR's persistently <60 mL/min signify possible Chronic Kidney Disease.    Anion gap 10 5 - 15     Musculoskeletal: Strength & Muscle Tone: within normal limits Gait & Station: normal Patient leans: N/A  Psychiatric Specialty Exam: Review of Systems  Constitutional: Negative for fever, chills, weight loss, malaise/fatigue and diaphoresis.  HENT: Negative for congestion, ear pain, hearing loss, nosebleeds, sore throat and tinnitus.   Eyes: Negative for blurred vision, double vision, photophobia, pain and redness.       The patient complains of a worsening headache over the past 2 days.  Respiratory: Negative.  Negative for cough, hemoptysis, sputum production, shortness of breath and wheezing.   Cardiovascular: Negative.  Negative for chest pain, palpitations, orthopnea, claudication and leg swelling.  Gastrointestinal: Positive for constipation. Negative for heartburn, nausea, vomiting, abdominal pain, diarrhea and blood in stool.       C/o having a tumor in his abdomen  Genitourinary: Negative.  Negative for dysuria, urgency, frequency and hematuria.  Musculoskeletal: Negative.  Negative for myalgias, back pain, joint pain, falls and neck pain.  Skin: Negative.  Negative for itching and rash.  Neurological: Positive for weakness and headaches. Negative for dizziness, tingling, tremors, sensory change, speech change, focal weakness and seizures.  Endo/Heme/Allergies: Negative.  Does not bruise/bleed easily.  Psychiatric/Behavioral: Positive for depression and memory loss. Negative for suicidal ideas. The patient has insomnia.   All other systems reviewed and are negative.   Blood pressure 107/72, pulse 80, temperature 98.1 F (36.7 C), temperature source Oral, resp. rate 18, height _0  (1.702 m), weight 67.132 kg (148 lb), SpO2 98 %.Body mass index is 23.17 kg/(m^2).  General Appearance: Fairly Groomed  Engineer, water::  Good  Speech:  Clear and Coherent  Volume:  Normal  Mood:  Okay  Affect:  Blunt  Thought Process:  Tangential and pre-occupied about physical problems   Orientation:  Full (Time, Place, and Person)  Thought Content:  Denies hallucinations, SI or HI.  Focus on abdominal mass  Suicidal Thoughts:  No  Homicidal Thoughts:  No  Memory:  Immediate;   Poor Recent;   Poor Remote;   Poor  Judgement:  Poor and Impaired.  Insight:  Lacking  Psychomotor Activity:  Decreased  Concentration:  Poor  Recall:  Poor  Fund of Knowledge:Poor  Language: Poor  Akathisia:  No  Handed:  Right  AIMS (if indicated):     Assets:  Limited.  ADL's:  Intact  Cognition: WNL  Sleep: 4h   Treatment Plan Summary: Daily contact with patient to assess and evaluate symptoms and progress in treatment and Medication management will be done. Pt was assured that undersigned will look into his labs and test results and he felt glad.  Mr. Bevins is a 49 year old male with a history of depression and psychosis admitting floridly psychotic and depressed in the context of medication noncompliance and severe social stressors.  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 Abilify was d/c on 12/8 due to its lack of benefit. Instead the patient has been stare on Seroquel.  I will increase seroquel to 200 mg  by mouth daily at bedtime.  Anxiety: Patient has documented history of anxiolytic dependence.   Hypertension: Continue Toprol-XL 50 mg a day.  Continue also  on Norvasc 5 mg a day  Constipation. It is unclear if this is delusional. Seems like constant complaint. I will continue the patient on Colace 200 mg a day and Senokot 2 tablets at bedtime.  Insomnia: doxepin 150 mg was d/c on 12/8.     Instead will use restoril 15 mg qhs.  Urinary tract infection. The patient has a history of prostatitis he believes that he needs to be on antibiotic. Urinalysis is not suggestive of urinary tract infection, urine culture is negative. No urinary complaints  Repeat UA was  negative. Pt was again explained and re-assured about the same.  Metabolic syndrome. Lipid panel is not elevated. TSH normal.   Headaches: This is an ongoing concern of of this patient. He says that his headaches are debilitating. Head CT was negative. MRI of the brain with and without contrast was completed on December 8 and he was negative.  B12 def: will start b12 inj q day for 7 days  ADLs. Occupational therapy consult was completed. it appears that low energy and tiredness are major obstacles.   Cognitive decline. Psychological consultation completed and did not detect gross cognitive difficulties. Please see results.This appears to be his baseline.   Hypogonadism. We ordered testosterone injections.   ECT: pt wasbeen [facility] to [facility] for possible ECT treatment.  Disposition. To be established. He is homeless and uninsured, and without any support. CSW will explore availability of resources in the Community.Continue current meds and constant re-direction provided and re-assurance provided.    Hildred Priest 04/30/2015, 12:51 PM

## 2015-04-30 NOTE — BHH Group Notes (Signed)
[place] LCSW Group Therapy   04/30/2015 1:15 PM   Type of Therapy: Group Therapy   Participation Level: Did Not Attend. Patient invited to participate but declined.    Alphonse Guild. Helmuth Recupero, MSW, LCSWA, LCAS

## 2015-04-30 NOTE — BHH Group Notes (Signed)
[place] Group Notes:  (Nursing/MHT/Case Management/Adjunct)  Date:  04/30/2015  Time:  12:36 PM  Type of Therapy:  Psychoeducational Skills  Participation Level:  Active  Participation Quality:  Appropriate, Attentive and Sharing  Affect:  Appropriate  Cognitive:  Appropriate  Insight:  Appropriate  Engagement in Group:  Engaged  Modes of Intervention:  Discussion, Education and Support  Summary of Progress/Problems:  Jeremiah [name] [name] [name] 04/30/2015, 12:36 PM

## 2015-04-30 NOTE — Progress Notes (Signed)
D: Pt continues to be fixated on migraine and abdominal tumor. Pt taken to MRI this evening accompanied by security guard and MHT. Pt denies SI/HI/AVH at this time. Pt complains of headache rated a 6 out of 10. A: Per pt request, evening medications administered after he returned from MRI. Emotional support and encouragement provided. q15 minute safety checks maintained. R: Pt remains free from harm.

## 2015-04-30 NOTE — BHH Group Notes (Signed)
[facility] LCSW Aftercare Discharge Planning Group Note   04/30/2015 2:17 PM  Participation Quality:  Did not attend group   Keene Breath, MSW, LCSWA

## 2015-04-30 NOTE — Progress Notes (Signed)
Recreation Therapy Notes  Date: 12.09.16 Time: 3:10 pm Location: Craft Room  Group Topic: Coping Skills  Goal Area(s) Addresses:  Patient will participate in coping skill. Patient will verbalize benefit of using art as a coping skill.  Behavioral Response: Attentive, Interactive  Intervention: Coloring  Activity: Patients were given coloring sheets and instructed to color and think about what emotions they were experiencing and what they are focused on.  Education: LRT educated patients on healthy coping skills.  Education Outcome: In group clarification offered  Clinical Observations/Feedback: Patient completed activity by coloring a coloring sheet. Patient contributed to group discussion.  Leonette Monarch, LRT/CTRS 04/30/2015 4:51 PM

## 2015-04-30 NOTE — Progress Notes (Signed)
D:  Pt reports sleeping fair-per report pt slept 4 hours 30 minutes last night, appetite poor-pt states that he has an abdominal tumor and has not had a bowel movement in 2 weeks-MD made aware of pt complaint, energy level low, pt denies depression/anxiety/hopelessness, denies SI/HI/AVH, flat/depressed affect.     A:  Emotional support provided, Encouraged pt to continue with treatment plan and attend all group activities, q15 min checks maintained for safety.  R:  Pt is not going to any groups and activities and continues to be focused on multiple somatic delusions, does not feel as though he needs to go to groups.

## 2015-04-30 NOTE — Plan of Care (Signed)
Problem: Alteration in thought process Goal: LTG-Patient behavior demonstrates decreased signs psychosis (Patient behavior demonstrates decreased signs of psychosis to the point the patient is safe to return home and continue treatment in an outpatient setting.)  Outcome: Not Progressing Pt continues to focused on somatic delusions regarding a "tumor in his abdomen" and "brain damage"

## 2015-05-01 MED ORDER — BENZOCAINE 10 % MT GEL
OROMUCOSAL | Status: DC | PRN
  Administered 2015-05-01 – 2015-05-02 (×2): via OROMUCOSAL
  Filled 2015-05-01: qty 9.4

## 2015-05-01 NOTE — Plan of Care (Signed)
Problem: Alteration in thought process Goal: LTG-Patient behavior demonstrates decreased signs psychosis (Patient behavior demonstrates decreased signs of psychosis to the point the patient is safe to return home and continue treatment in an outpatient setting.)  Outcome: Progressing Patient is demonstrates less psychosis.

## 2015-05-01 NOTE — BHH Group Notes (Signed)
[place] LCSW Group Therapy  05/01/2015 2:14 PM  Type of Therapy:  Group Therapy  Participation Level:  Did Not Attend  Participation Quality:     Affect:     Cognitive:     Insight:     Engagement in Therapy:     Modes of Intervention:     Summary of Progress/Problems:  Jeremiah [name], MSW, LCSW 05/01/2015, 2:14 PM

## 2015-05-01 NOTE — BHH Group Notes (Signed)
[facility] Group Notes:  (Nursing/MHT/Case Management/Adjunct)  Date:  05/01/2015  Time:  10:58 AM  Type of Therapy:  Psychoeducational Skills  Participation Level:  Did Not Attend    Leonie Douglas 05/01/2015, 10:58 AM

## 2015-05-01 NOTE — Progress Notes (Signed)
[facility] MD Progress Note  05/01/2015 11:54 AM  MRN:  [id number]  Subjective Patient is a 49 year old male who was seen for follow-up. He continues to remain psychosomatically preoccupied and was complaining about his urinary infection and that he is having problems with his urine. He reported that he feels that Seroquel is causing his symptoms. He reported that he is sleeping well with the help of temazepam and wants to go higher on the dose of the medication. He reported that he is not constipated and having difficulty passing bowel movements. He is interested in going higher on the dose of the sleeping medication as well as asking for the dose of Lunesta. He appeared disheveled during the interview. He currently denied having any mood swings as well as denied having any suicidal ideations or plans. He has been eating well and the staff is monitoring him on a daily basis. Pt continues to have multiple somatic complaints and it is a new one every day.  He has been compliant with psychotropic medications and denies any physical adverse side effects patient with those medications. He denies any auditory or visual hallucinations. He denies any paranoid thoughts or delusions. He says his mood has been depressed and he does endorse some anhedonia.  He denies any current active or passive suicidal thoughts or psychotic symptoms. He has been eating well.   Parents that he is slept 4 hours    Past psychiatric history. As above he carries multiple diagnoses. He was hospitalized at [facility] at least twice before. The patient is not a good historian and unable to give me any details of his medication history. Marland Kitchen Upon further questioning the patient has all his problems started in October 2015 when he suffered severe head injury that led to depression, mood instability, cognitive decline. He used to be evaluated regarding his high school but now has difficulties remembering everyday stuff. He was diagnosed with  dementia praecox. He also believes that all his somatic problems constipation and urinary tract infection and insomnia targeted at the same time.  Family psychiatric history. He denies any history of any mental illness or substance use in the family.  Social history. He was married twice but now divorced. He has two children age 48 and 53. He did well in school. He used to work as a Electronics engineer. He became dysfunctional following head injury. He is homeless, uninsured, with no social support. He applied for disability but was turned down and called a Risk manager.  Medical history: He has a long-standing history of multiple medical complaints most of which are never found to be diagnosable. He is always complaining of chronic severe constipation and trouble sleeping. Possible high blood pressure for real but no other known medical problems.  Substance abuse history: Patient says he is not drinking and does not use drugs. He says he dabbled in the years ago but patient's gait completely off of them now for years  Total Time spent with patient: 30 minutes  Principal Problem: Major depressive disorder, recurrent episode, severe, with psychosis ([place]) Diagnosis:   Patient Active Problem List   Diagnosis Date Noted  . Vitamin B12 deficiency [E53.8] 04/30/2015  . 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 Major depressive disorder, recurrent episode, severe, with psychosis ([place]) [F33.3] 04/29/2015  . Cervical pain [M54.2] 01/18/2015  . 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 GAD (generalized anxiety disorder) [F41.1] 10/19/2014  . Benign essential HTN [I10] 08/27/2014  . 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  Past Medical History:  Past Medical History  Diagnosis Date  . Hypertension   . Rectal fistula   . Dysuria   . Diverticulosis   .  Gastroparesis   . Borderline personality disorder   . Major depression ([place])   . Schizophrenia ([place])   . Hypokalemia   . Microscopic hematuria   . External hemorrhoid   . Over weight   . Nicotine addiction   . Difficulty urinating   . Stroke ([place])   . Benign essential HTN 08/27/2014  . BP (high blood pressure) 01/16/2015  . Anxiety     Past Surgical History  Procedure Laterality Date  . Lumbar spine surgery    . Colonoscopy    . Colonoscopy with propofol N/A 10/12/2014    Procedure: COLONOSCOPY WITH PROPOFOL;  Surgeon: Hulen Luster, MD;  Location: [facility] ENDOSCOPY;  Service: Gastroenterology;  Laterality: N/A;  . Tonsillectomy    . Back surgery     Family History:  Family History  Problem Relation Age of Onset  . Stroke Mother   . Stroke Maternal Grandmother   . Diabetes Mellitus II Sister    Family Psychiatric  History: None reported. Social History:  History  Alcohol Use No    Comment: last drink was in march 2016     History  Drug Use No    Social History   Social History  . Marital Status: Single    Spouse Name: N/A  . Number of Children: N/A  . Years of Education: N/A   Social History Main Topics  . Smoking status: Former Smoker    Types: Cigarettes    Quit date: 08/19/2014  . Smokeless tobacco: None  . Alcohol Use: No     Comment: last drink was in march 2016  . Drug Use: No  . Sexual Activity: Not Asked   Other Topics Concern  . None   Social History Narrative    Sleep: Poor  Appetite:  Fair  Current Medications: Current Facility-Administered Medications  Medication Dose Route Frequency Provider Last Rate Last Dose  . acetaminophen (TYLENOL) tablet 650 mg  650 mg Oral Q6H PRN Gonzella Lex, MD   650 mg at 04/30/15 2206  . alum & mag hydroxide-simeth (MAALOX/MYLANTA) 200-200-20 MG/5ML suspension 30 mL  30 mL Oral Q4H PRN Gonzella Lex, MD      . amLODipine (NORVASC) tablet 5 mg  5 mg Oral Daily Hildred Priest, MD   5 mg at 05/01/15  0930  . cyanocobalamin ((VITAMIN B-12)) injection 1,000 mcg  1,000 mcg Intramuscular Daily Hildred Priest, MD   1,000 mcg at 05/01/15 0930  . docusate sodium (COLACE) capsule 200 mg  200 mg Oral BID Jolanta B Pucilowska, MD   200 mg at 05/01/15 0930  . ibuprofen (ADVIL,MOTRIN) tablet 600 mg  600 mg Oral Q6H PRN Gonzella Lex, MD   600 mg at 04/04/15 1444  . magnesium hydroxide (MILK OF MAGNESIA) suspension 30 mL  30 mL Oral Daily PRN Gonzella Lex, MD      . metoprolol succinate (TOPROL-XL) 24 hr tablet 50 mg  50 mg Oral Daily Jolanta B Pucilowska, MD   50 mg at 05/01/15 0930  . QUEtiapine (SEROQUEL) tablet 200 mg  200 mg Oral QHS Hildred Priest, MD   200 mg at 04/30/15 2206  . senna (SENOKOT) tablet 17.2 mg  2 tablet Oral QHS  Hildred Priest, MD   17.2 mg at 04/30/15 2207  . temazepam (RESTORIL) capsule 15 mg  15 mg Oral QHS Hildred Priest, MD   15 mg at 04/30/15 2207  . testosterone enanthate (DELATESTRYL) injection 200 mg  200 mg Intramuscular Q28 days Clovis Fredrickson, MD   200 mg at 04/06/15 1143  . venlafaxine XR (EFFEXOR-XR) 24 hr capsule 300 mg  300 mg Oral Q breakfast Jolanta B Pucilowska, MD   300 mg at 05/01/15 0930    Lab Results:  Results for orders placed or performed during the hospital encounter of 03/30/15 (from the past 48 hour(s))  HIV antibody     Status: None   Collection Time: 04/29/15  1:45 PM  Result Value Ref Range   HIV Screen 4th Generation wRfx Non Reactive Non Reactive    Comment: (NOTE) Performed At: [facility] [address] [place], [place] [id number] Lindon Romp MD QI:[id number]   RPR     Status: None   Collection Time: 04/29/15  1:45 PM  Result Value Ref Range   RPR Ser Ql Non Reactive Non Reactive    Comment: (NOTE) Performed At: [facility] [place], [place] [id number] Lindon Romp MD YJ:[id number]   Vitamin B12     Status: None   Collection Time: 04/29/15   1:45 PM  Result Value Ref Range   Vitamin B-12 221 180 - 914 pg/mL    Comment: (NOTE) This assay is not validated for testing neonatal or myeloproliferative syndrome specimens for Vitamin B12 levels. Performed at [facility]   CBC     Status: None   Collection Time: 04/29/15  1:45 PM  Result Value Ref Range   WBC 6.8 3.8 - 10.6 K/uL   RBC 4.81 4.40 - 5.90 MIL/uL   Hemoglobin 14.5 13.0 - 18.0 g/dL   HCT 42.2 40.0 - 52.0 %   MCV 87.9 80.0 - 100.0 fL   MCH 30.1 26.0 - 34.0 pg   MCHC 34.3 32.0 - 36.0 g/dL   RDW 13.7 11.5 - 14.5 %   Platelets 321 150 - 440 K/uL  Comprehensive metabolic panel     Status: Abnormal   Collection Time: 04/29/15  1:45 PM  Result Value Ref Range   Sodium 135 135 - 145 mmol/L   Potassium 4.1 3.5 - 5.1 mmol/L   Chloride 98 (L) 101 - 111 mmol/L   CO2 27 22 - 32 mmol/L   Glucose, Bld 147 (H) 65 - 99 mg/dL   BUN 9 6 - 20 mg/dL   Creatinine, Ser 0.81 0.61 - 1.24 mg/dL   Calcium 9.6 8.9 - 10.3 mg/dL   Total Protein 7.1 6.5 - 8.1 g/dL   Albumin 4.3 3.5 - 5.0 g/dL   AST 16 15 - 41 U/L   ALT 14 (L) 17 - 63 U/L   Alkaline Phosphatase 61 38 - 126 U/L   Total Bilirubin 0.8 0.3 - 1.2 mg/dL   GFR calc non Af Amer >60 >60 mL/min   GFR calc Af Amer >60 >60 mL/min    Comment: (NOTE) The eGFR has been calculated using the CKD EPI equation. This calculation has not been validated in all clinical situations. eGFR's persistently <60 mL/min signify possible Chronic Kidney Disease.    Anion gap 10 5 - 15    Musculoskeletal: Strength & Muscle Tone: within normal limits Gait & Station: normal Patient leans: N/A  Psychiatric Specialty Exam: Review of Systems  Constitutional: Negative for  fever, chills, weight loss, malaise/fatigue and diaphoresis.  HENT: Negative for congestion, ear pain, hearing loss, nosebleeds, sore throat and tinnitus.   Eyes: Positive for blurred vision. Negative for double vision, photophobia, pain and redness.       The patient  complains of a worsening headache over the past 2 days.  Respiratory: Negative.  Negative for cough, hemoptysis, sputum production, shortness of breath and wheezing.   Cardiovascular: Negative.  Negative for chest pain, palpitations, orthopnea, claudication and leg swelling.  Gastrointestinal: Positive for heartburn and constipation. Negative for nausea, vomiting, abdominal pain, diarrhea and blood in stool.       C/o having a tumor in his abdomen  Genitourinary: Positive for dysuria and urgency. Negative for frequency and hematuria.  Musculoskeletal: Positive for back pain and neck pain. Negative for myalgias, joint pain and falls.  Skin: Negative.  Negative for itching and rash.  Neurological: Positive for tingling, weakness and headaches. Negative for dizziness, tremors, sensory change, speech change, focal weakness and seizures.  Endo/Heme/Allergies: Negative.  Does not bruise/bleed easily.  Psychiatric/Behavioral: Positive for depression and memory loss. Negative for suicidal ideas. The patient has insomnia.   All other systems reviewed and are negative.   Blood pressure 124/91, pulse 71, temperature 97.9 F (36.6 C), temperature source Oral, resp. rate 18, height _0  (1.702 m), weight 148 lb (67.132 kg), SpO2 98 %.Body mass index is 23.17 kg/(m^2).  General Appearance: Fairly Groomed  Engineer, water::  Good  Speech:  Clear and Coherent  Volume:  Normal  Mood:  Okay  Affect:  Blunt  Thought Process:  Tangential and pre-occupied about physical problems  Orientation:  Full (Time, Place, and Person)  Thought Content:  Denies hallucinations, SI or HI.  Focus on abdominal mass  Suicidal Thoughts:  No  Homicidal Thoughts:  No  Memory:  Immediate;   Poor Recent;   Poor Remote;   Poor  Judgement:  Poor and Impaired.  Insight:  Lacking  Psychomotor Activity:  Decreased  Concentration:  Poor  Recall:  Poor  Fund of Knowledge:Poor  Language: Poor  Akathisia:  No  Handed:  Right  AIMS  (if indicated):     Assets:  Limited.  ADL's:  Intact  Cognition: WNL  Sleep: 4h   Treatment Plan Summary: Daily contact with patient to assess and evaluate symptoms and progress in treatment and Medication management will be done. Pt was assured that undersigned will look into his labs and test results and he felt glad.  Mr. Wilkinson is a 49 year old male with a history of depression and psychosis admitting floridly psychotic and depressed in the context of medication noncompliance and severe social stressors.  Major depressive disorder and possibly somatic symptoms disorder or factitious disorder: Continue Effexor XR  300 mg a day Continue seroquel to 200 mg  by mouth daily at bedtime.  Anxiety: Patient has documented history of anxiolytic dependence.   Hypertension: Continue Toprol-XL 50 mg a day.  Continue also on Norvasc 5 mg a day  Constipation.continue the patient on Colace 200 mg a day and Senokot 2 tablets at bedtime.  Insomnia:  restoril 15 mg qhs. .  Metabolic syndrome. Lipid panel is not elevated. TSH normal.   Headaches: This is an ongoing concern of of this patient. He says that his headaches are debilitating. Head CT was negative. MRI of the brain with and without contrast was completed on December 8 and he was negative.  B12 def: will start b12 inj q day for 7  days  ADLs. Occupational therapy consult was completed. it appears that low energy and tiredness are major obstacles.   Cognitive decline. Psychological consultation completed and did not detect gross cognitive difficulties. Please see results.This appears to be his baseline.   Hypogonadism. We ordered testosterone injections.   ECT: [facility].  Disposition. To be established. He is homeless and uninsured, and without any support. CSW will explore availability of resources in the Community.Continue current meds and constant re-direction  provided and re-assurance provided.   Rainey Pines, MD  05/01/2015, 11:54 AM

## 2015-05-01 NOTE — Progress Notes (Signed)
Denies AVH.  Verbalizes that he has not had bm in 2 weeks.  Further states that he is not sleeping well because he cannot remember his dreams. No group attendance.  Stays to himself. Noted talking on phone during phone hours.  Medication compliant.

## 2015-05-01 NOTE — Progress Notes (Signed)
D: Pt denies SI/HI/AVH. Pt is pleasant and cooperative,affct is bright and flat, thoughts are sometimes disorganize abd illogical, he appears less anxious and he is interacting with staff appropriately.  A: Pt was offered support and encouragement. Pt was given scheduled medications. Pt was encouraged to attend groups. Q 15 minute checks were done for safety.  R:Pt did not attend evening group. Patient compliant with medication..Pt receptive to treatment and safety maintained on unit.

## 2015-05-02 NOTE — [facility] Group Notes (Signed)
[facility] Group Notes:  (Nursing/MHT/Case Management/Adjunct)  Date:  05/02/2015  Time:  9:38 PM  Type of Therapy:  Group Therapy  Participation Level:  Did Not Attend   Summary of Progress/Problems:  Jeremiah [name] 05/02/2015, 9:38 PM

## 2015-05-02 NOTE — Progress Notes (Signed)
D: Patient alert and orientedx3. Still showing somatic features. Says it's a bad thing that his CT came back clear. States a headache at a 9. Denies SI/HI/AVH at this time. A: Medication was provided with education. Encouragement given.  R: Patient compliant with parts of his medication. He would not take seroquel at first because he stated it made his stomach hurt. He then decided to take it with Mylanta. This was discussed with Pharmacist and approved. Safety maintained with 15 min checks.

## 2015-05-02 NOTE — Plan of Care (Signed)
Problem: Ineffective individual coping Goal: LTG: Patient will report a decrease in negative feelings Outcome: Not Progressing Continues to verbalize depression and rates as 7/10

## 2015-05-02 NOTE — Plan of Care (Signed)
Problem: Ineffective individual coping Goal: STG-Increase in ability to manage activities of daily living Outcome: Progressing Patient got a shower and made his bed at beginning of shift.

## 2015-05-02 NOTE — Progress Notes (Signed)
[facility] MD Progress Note  05/02/2015 3:34 PM  MRN:  [id number]  Subjective Patient is a 49 year old male who was seen for follow-up. He continues to remain psychosomatically preoccupied and was complaining about his constipation and inability to sleep. Patient reported that he is not able to have a bowel movement on a regular manner although he has bowel movements. He reported that he is also not sleeping well and was asking for Benadryl. He reported that he will get a rash at night and the staff has seen that. I asked him to show his rash and there was no rash. Stated that the rash usually comes at night. He was asking for an increase in the dose of his sleeping aid. He appeared disheveled during the interview he stated that he is not able to eat well during the daytime as well. Patient remains psychosomatically preoccupied and has different complaints during the interview.  Pt continues to have multiple somatic complaints and it is a new one every day.  He has been compliant with psychotropic medications and denies any physical adverse side effects patient with those medications. He denies any auditory or visual hallucinations. He denies any paranoid thoughts or delusions. He says his mood has been depressed and he does endorse some anhedonia.  He denies any current active or passive suicidal thoughts or psychotic symptoms. He has been eating well.   Parents that he is slept 4 hours    Past psychiatric history. As above he carries multiple diagnoses. He was hospitalized at [facility] at least twice before. The patient is not a good historian and unable to give me any details of his medication history. Marland Kitchen Upon further questioning the patient has all his problems started in October 2015 when he suffered severe head injury that led to depression, mood instability, cognitive decline. He used to be evaluated regarding his high school but now has difficulties remembering everyday stuff. He was diagnosed with  dementia praecox. He also believes that all his somatic problems constipation and urinary tract infection and insomnia targeted at the same time.  Family psychiatric history. He denies any history of any mental illness or substance use in the family.  Social history. He was married twice but now divorced. He has two children age 51 and 88. He did well in school. He used to work as a Electronics engineer. He became dysfunctional following head injury. He is homeless, uninsured, with no social support. He applied for disability but was turned down and called a Risk manager.  Medical history: He has a long-standing history of multiple medical complaints most of which are never found to be diagnosable. He is always complaining of chronic severe constipation and trouble sleeping. Possible high blood pressure for real but no other known medical problems.  Substance abuse history: Patient says he is not drinking and does not use drugs. He says he dabbled in the years ago but patient's gait completely off of them now for years  Total Time spent with patient: 30 minutes  Principal Problem: Major depressive disorder, recurrent episode, severe, with psychosis ([place]) Diagnosis:   Patient Active Problem List   Diagnosis Date Noted  . Vitamin B12 deficiency [E53.8] 04/30/2015  . 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 Major depressive disorder, recurrent episode, severe, with psychosis ([place]) [F33.3] 04/29/2015  . Cervical pain [M54.2] 01/18/2015  . 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 GAD (generalized anxiety disorder) [F41.1] 10/19/2014  . Benign essential HTN [I10] 08/27/2014  .  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  Past Medical History:  Past Medical History  Diagnosis Date  . Hypertension   . Rectal fistula   . Dysuria   . Diverticulosis   .  Gastroparesis   . Borderline personality disorder   . Major depression ([place])   . Schizophrenia ([place])   . Hypokalemia   . Microscopic hematuria   . External hemorrhoid   . Over weight   . Nicotine addiction   . Difficulty urinating   . Stroke ([place])   . Benign essential HTN 08/27/2014  . BP (high blood pressure) 01/16/2015  . Anxiety     Past Surgical History  Procedure Laterality Date  . Lumbar spine surgery    . Colonoscopy    . Colonoscopy with propofol N/A 10/12/2014    Procedure: COLONOSCOPY WITH PROPOFOL;  Surgeon: Hulen Luster, MD;  Location: [facility] ENDOSCOPY;  Service: Gastroenterology;  Laterality: N/A;  . Tonsillectomy    . Back surgery     Family History:  Family History  Problem Relation Age of Onset  . Stroke Mother   . Stroke Maternal Grandmother   . Diabetes Mellitus II Sister    Family Psychiatric  History: None reported. Social History:  History  Alcohol Use No    Comment: last drink was in march 2016     History  Drug Use No    Social History   Social History  . Marital Status: Single    Spouse Name: N/A  . Number of Children: N/A  . Years of Education: N/A   Social History Main Topics  . Smoking status: Former Smoker    Types: Cigarettes    Quit date: 08/19/2014  . Smokeless tobacco: None  . Alcohol Use: No     Comment: last drink was in march 2016  . Drug Use: No  . Sexual Activity: Not Asked   Other Topics Concern  . None   Social History Narrative    Sleep: Poor  Appetite:  Fair  Current Medications: Current Facility-Administered Medications  Medication Dose Route Frequency Provider Last Rate Last Dose  . acetaminophen (TYLENOL) tablet 650 mg  650 mg Oral Q6H PRN Gonzella Lex, MD   650 mg at 05/01/15 2159  . alum & mag hydroxide-simeth (MAALOX/MYLANTA) 200-200-20 MG/5ML suspension 30 mL  30 mL Oral Q4H PRN Gonzella Lex, MD   30 mL at 05/01/15 2335  . amLODipine (NORVASC) tablet 5 mg  5 mg Oral Daily Hildred Priest, MD    5 mg at 05/02/15 0841  . benzocaine (ORAJEL) 10 % mucosal gel   Mouth/Throat Q4H PRN Rainey Pines, MD      . cyanocobalamin ((VITAMIN B-12)) injection 1,000 mcg  1,000 mcg Intramuscular Daily Hildred Priest, MD   1,000 mcg at 05/02/15 [phone]  . docusate sodium (COLACE) capsule 200 mg  200 mg Oral BID Jolanta B Pucilowska, MD   200 mg at 05/01/15 0930  . ibuprofen (ADVIL,MOTRIN) tablet 600 mg  600 mg Oral Q6H PRN Gonzella Lex, MD   600 mg at 04/04/15 1444  . magnesium hydroxide (MILK OF MAGNESIA) suspension 30 mL  30 mL Oral Daily PRN Gonzella Lex, MD      . metoprolol succinate (TOPROL-XL) 24 hr tablet 50 mg  50 mg Oral Daily Jolanta B Pucilowska, MD   50 mg at 05/02/15 0841  . QUEtiapine (SEROQUEL) tablet 200 mg  200 mg Oral QHS Hildred Priest, MD   200 mg at 05/01/15 1136  . senna (SENOKOT) tablet 17.2 mg  2 tablet Oral QHS Hildred Priest, MD   17.2 mg at 05/01/15 2157  . temazepam (RESTORIL) capsule 15 mg  15 mg Oral QHS Hildred Priest, MD   15 mg at 05/01/15 2158  . testosterone enanthate (DELATESTRYL) injection 200 mg  200 mg Intramuscular Q28 days Clovis Fredrickson, MD   200 mg at 04/06/15 1143  . venlafaxine XR (EFFEXOR-XR) 24 hr capsule 300 mg  300 mg Oral Q breakfast Jolanta B Pucilowska, MD   300 mg at 05/02/15 0841    Lab Results:  No results found for this or any previous visit (from the past 48 hour(s)).  Musculoskeletal: Strength & Muscle Tone: within normal limits Gait & Station: normal Patient leans: N/A  Psychiatric Specialty Exam: Review of Systems  Constitutional: Negative for fever, chills, weight loss, malaise/fatigue and diaphoresis.  HENT: Negative for congestion, ear pain, hearing loss, nosebleeds, sore throat and tinnitus.   Eyes: Positive for blurred vision. Negative for double vision, photophobia, pain and redness.       The patient complains of a worsening headache over the past 2 days.  Respiratory: Negative.   Negative for cough, hemoptysis, sputum production, shortness of breath and wheezing.   Cardiovascular: Negative.  Negative for chest pain, palpitations, orthopnea, claudication and leg swelling.  Gastrointestinal: Positive for heartburn and constipation. Negative for nausea, vomiting, abdominal pain, diarrhea and blood in stool.       C/o having a tumor in his abdomen  Genitourinary: Positive for dysuria and urgency. Negative for frequency and hematuria.  Musculoskeletal: Positive for back pain and neck pain. Negative for myalgias, joint pain and falls.  Skin: Negative.  Negative for itching and rash.  Neurological: Positive for tingling, weakness and headaches. Negative for dizziness, tremors, sensory change, speech change, focal weakness and seizures.  Endo/Heme/Allergies: Negative.  Does not bruise/bleed easily.  Psychiatric/Behavioral: Positive for depression and memory loss. Negative for suicidal ideas. The patient has insomnia.   All other systems reviewed and are negative.   Blood pressure 127/88, pulse 65, temperature 97.9 F (36.6 C), temperature source Oral, resp. rate 18, height 5\' 7"  (1.702 m), weight 148 lb (67.132 kg), SpO2 98 %.Body mass index is 23.17 kg/(m^2).  General Appearance: Fairly Groomed  Engineer, water::  Good  Speech:  Clear and Coherent  Volume:  Normal  Mood:  Okay  Affect:  Blunt  Thought Process:  Tangential and pre-occupied about physical problems  Orientation:  Full (Time, Place, and Person)  Thought Content:  Denies hallucinations, SI or HI.  Focus on abdominal mass  Suicidal Thoughts:  No  Homicidal Thoughts:  No  Memory:  Immediate;   Poor Recent;   Poor Remote;   Poor  Judgement:  Poor and Impaired.  Insight:  Lacking  Psychomotor Activity:  Decreased  Concentration:  Poor  Recall:  Poor  Fund of Knowledge:Poor  Language: Poor  Akathisia:  No  Handed:  Right  AIMS (if indicated):     Assets:  Limited.  ADL's:  Intact  Cognition: WNL  Sleep:  4h   Treatment Plan Summary: Daily contact with patient to assess and evaluate symptoms and progress in treatment and Medication management will be done. Pt was assured that undersigned will look into his labs and test results and he felt glad.  Mr. Spiewak is a 49 year old male with a history of depression and psychosis admitting  floridly psychotic and depressed in the context of medication noncompliance and severe social stressors.  Major depressive disorder and possibly somatic symptoms disorder or factitious disorder: Continue Effexor XR  300 mg a day Continue seroquel to 200 mg  by mouth daily at bedtime.  Anxiety: Patient has documented history of anxiolytic dependence.   Hypertension: Continue Toprol-XL 50 mg a day.  Continue also on Norvasc 5 mg a day  Constipation.continue the patient on Colace 200 mg a day and Senokot 2 tablets at bedtime.  Insomnia:  restoril 15 mg qhs. .  Metabolic syndrome. Lipid panel is not elevated. TSH normal.   Headaches: This is an ongoing concern of of this patient. He says that his headaches are debilitating. Head CT was negative. MRI of the brain with and without contrast was completed on December 8 and he was negative.  B12 def: will start b12 inj q day for 7 days  ADLs. Occupational therapy consult was completed. it appears that low energy and tiredness are major obstacles.   Cognitive decline. Psychological consultation completed and did not detect gross cognitive difficulties. Please see results.This appears to be his baseline.   Hypogonadism. We ordered testosterone injections.   ECT: pt wasbeen referred to [facility] for [facility] E[facility] treatment.  Disposition. To be established. He is homeless and uninsured, and without any support. CSW will explore availability of resources in the Community.Continue current meds and constant re-direction provided and re-assurance provided.   Rainey Pines, MD  05/02/2015, 3:34 PM

## 2015-05-02 NOTE — Progress Notes (Signed)
Patient ID: Jeremiah [name], male   DOB: 1965/09/29, 49 y.o.   MRN: [id number]   D: Pt has been very flat and depressed on the unit today, pt has also been very isolative. Pt did not attend any groups, nor did he engage in treatment. Pt did take all medications as prescribed by the doctor, but has not been eating much. This Probation officer has continued to encourage the patient to eat and drink, but was receptive to feedback.  Pt reported that he remained very depressed, but reported that he was not hopeless. Pt reported being negative SI/HI, no AH/VH noted. A: 15 min checks continued for patient safety. R: Pt safety maintained.

## 2015-05-02 NOTE — [facility] Group Notes (Signed)
[facility] LCSW Group Therapy  05/02/2015 3:13 PM  Type of Therapy:  Group Therapy  Participation Level:  Did Not Attend  Modes of Intervention:  Discussion, Education, Socialization and Support  Summary of Progress/Problems: Todays topic: Grudges  Patients will be encouraged to discuss their thoughts, feelings, and behaviors as to why one holds on to grudges and reasons why people have grudges. Patients will process the impact of grudges on their daily lives and identify thoughts and feelings related to holding grudges. Patients will identify feelings and thoughts related to what life would look like without grudges.    [place] MSW, [place]  05/02/2015, 3:13 PM

## 2015-05-03 NOTE — Plan of Care (Signed)
Problem: Ineffective individual coping Goal: STG: Pt will be able to identify effective and ineffective STG: Pt will be able to identify effective and ineffective coping patterns  Outcome: Not Progressing Pt refuses teaching on coping skills. Does not attend groups on unit  Problem: Alteration in thought process Goal: STG-Patient is able to discuss thoughts with staff Outcome: Progressing Pt discusses thoughts and feelings with staff

## 2015-05-03 NOTE — Progress Notes (Signed)
D: Pt continues to isolate in his room this evening. Verbalizes passive SI but will contract for safety. Denies HI/AVH at this time. Pt c/o pelvic pain rated a 10 out of 10. Pt continues to focus on somatic complaints. States "I almost want to call [employer] so they can see what I'm going through." A: Emotional support and encouragement provided. Pt encouraged to attend groups and come up with positive coping skills. Pt educated on nonpharmacological pain relief measures. Medications given as prescribed. q15 minute safety checks maintained. R: Pt refused teaching. States "the pain is too severe nothing will work." Pt remains free from harm.

## 2015-05-03 NOTE — Progress Notes (Signed)
Recreation Therapy Notes  Date: 12.12.16 Time: 3:00 pm Location: Craft Room  Group Topic: Wellness  Goal Area(s) Addresses:  Patient will identify at least one item per dimension of health. Patient will examine areas they are deficient in.  Behavioral Response: Did not attend  Intervention: 6 Dimensions of Health  Activity: Patients were given a worksheet with the definitions of the 6 Dimension of Health and examples of each dimension. Patients were given a worksheet with the 6 dimensions on it and instructed to list 2-3 things in each dimension.  Education: LRT educated patients on how this activity was related to their admission and d/c from the hospital.  Education Outcome: Patient did not attend group.   Clinical Observations/Feedback: Patient did not attend group.  Leonette Monarch, LRT/CTRS 05/03/2015 4:42 PM

## 2015-05-03 NOTE — B[facility] Group Notes (Signed)
[facility] Group Notes:  (Nursing/MHT/Case Management/Adjunct)  Date:  05/03/2015  Time:  1:55 PM  Type of Therapy:  Psychoeducational Skills  Participation Level:  Did Not Attend  Cheryll Dessert 05/03/2015, 1:55 PM

## 2015-05-03 NOTE — B[facility] Group Notes (Signed)
[place] LCSW Group Therapy   05/03/2015 1:15 PM   Type of Therapy: Group Therapy   Participation Level: Did Not Attend. Patient invited to participate but declined.    Alphonse Guild. Silvanna Ohmer, MSW, LCSWA, LCAS

## 2015-05-03 NOTE — Progress Notes (Signed)
D:  Per pt self inventory pt reports sleeping fair, appetite good, energy level low, denies SI/HI/AVH, denies depression/hopelessnes, pt refused to take scheduled testosterone injection states "that shot gave me a stroke a years ago and my testosterone receptors aren't working, there is just a lot going on right now."  A:  MD notified of patient refusing testosterone injection, Emotional support provided, Encouraged pt to continue with treatment plan and attend all group activities, q15 min checks maintained for safety.  R:  Pt is not going to any groups, isolates in room except for meal, refusing to participate.

## 2015-05-03 NOTE — B[facility] Group Notes (Signed)
[facility] LCSW Aftercare Discharge Planning Group Note   05/03/2015 2:53 PM  Participation Quality:  Did not attend  Jeremiah [name] Jeremiah [name], LCAS  05/03/15

## 2015-05-03 NOTE — Progress Notes (Signed)
[facility] MD Progress Note  05/03/2015 3:55 PM Jeremiah [name]  MRN:  [id number]  Subjective:  Jeremiah [name] continues to be very somatic. He complains of headache, problems with his bowels and poor energy and believes that he needs to be transferred to medical floor. He is still depressed. He believes that his memory is gone even though he was formally tested and no deficits were identified. He thinks that he is unable to take care of himself. He thinks that he be placed in the nursing home. When confronted about his discharge plan the patient reported suicidal ideation. He was unable to identify the plan that reportedly is thinking all the time how to kill himself when discharged. He refused testosterone injection today. He initially reported that he felt much better when on testosterone. Today she believes that he has not testosterone receptors that medications will not work.  Principal Problem: Major depressive disorder, recurrent episode, severe, with psychosis ([place]) Diagnosis:   Patient Active Problem List   Diagnosis Date Noted  . Vitamin B12 deficiency [E53.8] 04/30/2015  . 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 Major depressive disorder, recurrent episode, severe, with psychosis ([place]) [F33.3] 04/29/2015  . Cervical pain [M54.2] 01/18/2015  . 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 GAD (generalized anxiety disorder) [F41.1] 10/19/2014  . Benign essential HTN [I10] 08/27/2014  . 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  Past Medical History:  Past Medical History  Diagnosis Date  . Hypertension   . Rectal fistula   . Dysuria   . Diverticulosis   . Gastroparesis   . Borderline personality disorder   . Major depression ([place])   . Schizophrenia ([place])   . Hypokalemia   . Microscopic hematuria   . External hemorrhoid   . Over weight   . Nicotine addiction   .  Difficulty urinating   . Stroke ([place])   . Benign essential HTN 08/27/2014  . BP (high blood pressure) 01/16/2015  . Anxiety     Past Surgical History  Procedure Laterality Date  . Lumbar spine surgery    . Colonoscopy    . Colonoscopy with propofol N/A 10/12/2014    Procedure: COLONOSCOPY WITH PROPOFOL;  Surgeon: Hulen Luster, MD;  Location: [facility] ENDOSCOPY;  Service: Gastroenterology;  Laterality: N/A;  . Tonsillectomy    . Back surgery     Family History:  Family History  Problem Relation Age of Onset  . Stroke Mother   . Stroke Maternal Grandmother   . Diabetes Mellitus II Sister    Family Psychiatric  History: none reported. Social History:  History  Alcohol Use No    Comment: last drink was in march 2016     History  Drug Use No    Social History   Social History  . Marital Status: Single    Spouse Name: N/A  . Number of Children: N/A  . Years of Education: N/A   Social History Main Topics  . Smoking status: Former Smoker    Types: Cigarettes    Quit date: 08/19/2014  . Smokeless tobacco: None  . Alcohol Use: No     Comment: last drink was in march 2016  . Drug Use: No  . Sexual Activity: Not Asked   Other Topics Concern  . None   Social History Narrative   Additional Social History:  Sleep: Poor  Appetite:  Poor  Current Medications: Current Facility-Administered Medications  Medication Dose Route Frequency Provider Last Rate Last Dose  . acetaminophen (TYLENOL) tablet 650 mg  650 mg Oral Q6H PRN Gonzella Lex, MD   650 mg at 05/02/15 2141  . alum & mag hydroxide-simeth (MAALOX/MYLANTA) 200-200-20 MG/5ML suspension 30 mL  30 mL Oral Q4H PRN Gonzella Lex, MD   30 mL at 05/01/15 2335  . amLODipine (NORVASC) tablet 5 mg  5 mg Oral Daily Hildred Priest, MD   5 mg at 05/03/15 [id number]  . benzocaine (ORAJEL) 10 % mucosal gel   Mouth/Throat Q4H PRN Rainey Pines, MD      . cyanocobalamin ((VITAMIN B-12)) injection  1,000 mcg  1,000 mcg Intramuscular Daily Hildred Priest, MD   1,000 mcg at 05/03/15 [id number]  . docusate sodium (COLACE) capsule 200 mg  200 mg Oral BID Clovis Fredrickson, MD   200 mg at 05/03/15 0827  . ibuprofen (ADVIL,MOTRIN) tablet 600 mg  600 mg Oral Q6H PRN Gonzella Lex, MD   600 mg at 04/04/15 1444  . magnesium hydroxide (MILK OF MAGNESIA) suspension 30 mL  30 mL Oral Daily PRN Gonzella Lex, MD      . metoprolol succinate (TOPROL-XL) 24 hr tablet 50 mg  50 mg Oral Daily Evamae Rowen B Garnet Overfield, MD   50 mg at 05/03/15 0828  . QUEtiapine (SEROQUEL) tablet 200 mg  200 mg Oral QHS Hildred Priest, MD   200 mg at 05/02/15 2141  . senna (SENOKOT) tablet 17.2 mg  2 tablet Oral QHS Hildred Priest, MD   17.2 mg at 05/02/15 2142  . temazepam (RESTORIL) capsule 15 mg  15 mg Oral QHS Hildred Priest, MD   15 mg at 05/02/15 2141  . testosterone enanthate (DELATESTRYL) injection 200 mg  200 mg Intramuscular Q28 days Clovis Fredrickson, MD   200 mg at 04/06/15 1143  . venlafaxine XR (EFFEXOR-XR) 24 hr capsule 300 mg  300 mg Oral Q breakfast Mykiah Schmuck B Trevonn Hallum, MD   300 mg at 05/03/15 [id number]    Lab Results: No results found for this or any previous visit (from the past 48 hour(s)).  Physical Findings: AIMS:  , ,  ,  ,    CIWA:    COWS:     Musculoskeletal: Strength & Muscle Tone: within normal limits Gait & Station: normal Patient leans: N/A  Psychiatric Specialty Exam: Review of Systems  Gastrointestinal: Positive for diarrhea and constipation.  Neurological: Positive for headaches.  All other systems reviewed and are negative.   Blood pressure 102/69, pulse 78, temperature 98 F (36.7 C), temperature source Oral, resp. rate 18, height 5\' 7"  (1.702 m), weight 67.132 kg (148 lb), SpO2 98 %.Body mass index is 23.17 kg/(m^2).  General Appearance: Casual  Eye Contact::  Fair  Speech:  Clear and Coherent  Volume:  Normal  Mood:  Depressed, Hopeless  and Worthless  Affect:  Blunt  Thought Process:  Circumstantial  Orientation:  Full (Time, Place, and Person)  Thought Content:  Delusions and Paranoid Ideation  Suicidal Thoughts:  Yes.  with intent/plan  Homicidal Thoughts:  No  Memory:  Immediate;   Fair Recent;   Fair Remote;   Fair  Judgement:  Impaired  Insight:  Lacking  Psychomotor Activity:  Decreased  Concentration:  Fair  Recall:  [place]  Language: Fair  Akathisia:  No  Handed:  Right  AIMS (if indicated):  Assets:  Communication Skills Desire for Improvement Resilience  ADL's:  Intact  Cognition: WNL  Sleep:  Number of Hours: 7   Treatment Plan Summary: Daily contact with patient to assess and evaluate symptoms and progress in treatment and Medication management   Jeremiah [name] is a 49 year old male with a history of depression and psychosis admitting floridly psychotic and depressed in the context of medication noncompliance and severe social stressors.  1. Suicidal ideation. He continues to feel suicidal butr able to contract for safety in the hospital.  2. Mood/psychosis. Continue Effexor XR for depression and Seroquel for psychosis.   3. Anxiety: Patient has documented history of anxiolytic dependence. He completed Librium taper.  4. Hypertension: Continue Toprol-XL and Norvasc.  5. Constipation. Continue Colace and Senokot.   6. Insomnia. He is on Restoril. Slept 7 hours.  7. Metabolic syndrome. Lipid panel is not elevated. TSH normal.   8. Headaches: This is an ongoing concern of of this patient. He says that his headaches are debilitating. Head CT was negative. MRI of the brain with and without contrast was completed on December 8 and he was negative.  9. B12 defficiency. We started Vit B12 injections.   10. ADLs. Occupational therapy consult was completed. It appears that low energy and tiredness are major obstacles.   11. Cognitive decline. Psychological consultation  completed and did not detect gross cognitive difficulties. Please see results.This appears to be his baseline.   12. Hypogonadism. We ordered testosterone injections that the patient now refuses.  13. Disposition. The patient has been referred to [facility] hospital.   Hendy Brindle 05/03/2015, 3:55 PM

## 2015-05-04 MED ORDER — VENLAFAXINE HCL ER 75 MG PO CP24
150.0000 mg | ORAL_CAPSULE | Freq: Every day | ORAL | Status: DC
  Administered 2015-05-05: 150 mg via ORAL
  Filled 2015-05-04: qty 2

## 2015-05-04 MED ORDER — QUETIAPINE FUMARATE 200 MG PO TABS
400.0000 mg | ORAL_TABLET | Freq: Every day | ORAL | Status: DC
  Administered 2015-05-04: 400 mg via ORAL
  Filled 2015-05-04: qty 2

## 2015-05-04 MED ORDER — FLUVOXAMINE MALEATE 100 MG PO TABS
100.0000 mg | ORAL_TABLET | Freq: Every day | ORAL | Status: DC
  Administered 2015-05-04: 100 mg via ORAL
  Filled 2015-05-04 (×2): qty 1

## 2015-05-04 NOTE — BHH Group Notes (Signed)
[facility] Group Notes:  (Nursing/MHT/Case Management/Adjunct)  Date:  05/04/2015  Time:  2:11 PM  Type of Therapy:  Psychoeducational Skills  Participation Level:  Did Not Attend   Driscilla Moats 05/04/2015, 2:11 PM

## 2015-05-04 NOTE — Progress Notes (Signed)
D:Patient staying close to room  Isolative from peer and staff. Continue to compliant of tumor in abdomen appetite good and sleep good . Poor hygiene efforts   Concentration level  Fluctuates with the information he is processing . Voice no concerns around placement  A: Encourage patient participation with unit programming . Instruction  Given on  Medication , verbalize understanding. R: Voice no other concerns. Staff continue to monitor

## 2015-05-04 NOTE — BHH Group Notes (Signed)
[facility] LCSW Group Therapy   05/04/2015 1:15 PM   Type of Therapy: Group Therapy   Participation Level: Did Not Attend. Patient invited to participate but declined.    Alphonse Guild. Cashlyn Huguley, MSW, LCSWA, LCAS

## 2015-05-04 NOTE — Progress Notes (Signed)
[facility] MD Progress Note  05/04/2015 3:24 PM Jeremiah [name]  MRN:  [id number]  Subjective:  Jeremiah [name] has just suffered a stroke and points to the middle of his forehead. He believes that he is unable to get out of bed because his heaviest pounding. I did watch him going to the dining area having lunch. He still believes that his bowels are out of order. He does not believe he has mental illness but he demands to be transferred to medical floor to address his multiple somatic complaints. He has not been participating in programming on the unit at all. He spends days in the bed with his head covered. He feels utterly hopeless and wants to suicide when discharged .  Principal Problem: Major depressive disorder, recurrent episode, severe, with psychosis ([place]) Diagnosis:   Patient Active Problem List   Diagnosis Date Noted  . Vitamin B12 deficiency [E53.8] 04/30/2015  . 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 Major depressive disorder, recurrent episode, severe, with psychosis ([place]) [F33.3] 04/29/2015  . Cervical pain [M54.2] 01/18/2015  . 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 GAD (generalized anxiety disorder) [F41.1] 10/19/2014  . Benign essential HTN [I10] 08/27/2014  . 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  Past Medical History:  Past Medical History  Diagnosis Date  . Hypertension   . Rectal fistula   . Dysuria   . Diverticulosis   . Gastroparesis   . Borderline personality disorder   . Major depression ([place])   . Schizophrenia ([place])   . Hypokalemia   . Microscopic hematuria   . External hemorrhoid   . Over weight   . Nicotine addiction   . Difficulty urinating   . Stroke ([place])   . Benign essential HTN 08/27/2014  . BP (high blood pressure) 01/16/2015  . Anxiety     Past Surgical History  Procedure Laterality Date  . Lumbar spine surgery    .  Colonoscopy    . Colonoscopy with propofol N/A 10/12/2014    Procedure: COLONOSCOPY WITH PROPOFOL;  Surgeon: Hulen Luster, MD;  Location: [facility] ENDOSCOPY;  Service: Gastroenterology;  Laterality: N/A;  . Tonsillectomy    . Back surgery     Family History:  Family History  Problem Relation Age of Onset  . Stroke Mother   . Stroke Maternal Grandmother   . Diabetes Mellitus II Sister    Family Psychiatric  History: None reported. Social History:  History  Alcohol Use No    Comment: last drink was in march 2016     History  Drug Use No    Social History   Social History  . Marital Status: Single    Spouse Name: N/A  . Number of Children: N/A  . Years of Education: N/A   Social History Main Topics  . Smoking status: Former Smoker    Types: Cigarettes    Quit date: 08/19/2014  . Smokeless tobacco: None  . Alcohol Use: No     Comment: last drink was in march 2016  . Drug Use: No  . Sexual Activity: Not Asked   Other Topics Concern  . None   Social History Narrative   Additional Social History:                         Sleep: Fair  Appetite:  Fair  Current Medications: Current Facility-Administered Medications  Medication  Dose Route Frequency Provider Last Rate Last Dose  . acetaminophen (TYLENOL) tablet 650 mg  650 mg Oral Q6H PRN Gonzella Lex, MD   650 mg at 05/03/15 2128  . alum & mag hydroxide-simeth (MAALOX/MYLANTA) 200-200-20 MG/5ML suspension 30 mL  30 mL Oral Q4H PRN Gonzella Lex, MD   30 mL at 05/01/15 2335  . amLODipine (NORVASC) tablet 5 mg  5 mg Oral Daily Hildred Priest, MD   5 mg at 05/04/15 0958  . benzocaine (ORAJEL) 10 % mucosal gel   Mouth/Throat Q4H PRN Rainey Pines, MD      . cyanocobalamin ((VITAMIN B-12)) injection 1,000 mcg  1,000 mcg Intramuscular Daily Hildred Priest, MD   1,000 mcg at 05/04/15 0959  . docusate sodium (COLACE) capsule 200 mg  200 mg Oral BID Clovis Fredrickson, MD   200 mg at 05/04/15 0958  .  fluvoxaMINE (LUVOX) tablet 100 mg  100 mg Oral QHS Mittie Knittel B Claritza July, MD      . ibuprofen (ADVIL,MOTRIN) tablet 600 mg  600 mg Oral Q6H PRN Gonzella Lex, MD   600 mg at 04/04/15 1444  . magnesium hydroxide (MILK OF MAGNESIA) suspension 30 mL  30 mL Oral Daily PRN Gonzella Lex, MD      . metoprolol succinate (TOPROL-XL) 24 hr tablet 50 mg  50 mg Oral Daily Lavonne Cass B Akita Maxim, MD   50 mg at 05/04/15 0958  . QUEtiapine (SEROQUEL) tablet 400 mg  400 mg Oral QHS Fabio Wah B Mackenzee Becvar, MD      . senna (SENOKOT) tablet 17.2 mg  2 tablet Oral QHS Hildred Priest, MD   17.2 mg at 05/03/15 2128  . temazepam (RESTORIL) capsule 15 mg  15 mg Oral QHS Hildred Priest, MD   15 mg at 05/03/15 2128  . [START ON 05/05/2015] venlafaxine XR (EFFEXOR-XR) 24 hr capsule 150 mg  150 mg Oral Q breakfast Taneeka Curtner B Jacobie Stamey, MD        Lab Results: No results found for this or any previous visit (from the past 48 hour(s)).  Physical Findings: AIMS:  , ,  ,  ,    CIWA:    COWS:     Musculoskeletal: Strength & Muscle Tone: within normal limits Gait & Station: normal Patient leans: N/A  Psychiatric Specialty Exam: Review of Systems  Gastrointestinal: Positive for constipation.  Neurological: Positive for headaches.  All other systems reviewed and are negative.   Blood pressure 112/79, pulse 71, temperature 98 F (36.7 C), temperature source Oral, resp. rate 18, height 5\' 7"  (1.702 m), weight 67.132 kg (148 lb), SpO2 98 %.Body mass index is 23.17 kg/(m^2).  General Appearance: Disheveled  Eye Contact::  Minimal  Speech:  Slow  Volume:  Decreased  Mood:  Anxious and Depressed  Affect:  Blunt  Thought Process:  Circumstantial  Orientation:  Full (Time, Place, and Person)  Thought Content:  Delusions and Paranoid Ideation  Suicidal Thoughts:  Yes.  with intent/plan  Homicidal Thoughts:  No  Memory:  Immediate;   Fair Recent;   Fair Remote;   Fair  Judgement:  Impaired   Insight:  Lacking that she does not want Psychiatric E Day  Psychomotor Activity:  Decreased  Concentration:  Fair  Recall:  [employer] of Knowledge:Fair  Language: Fair  Akathisia:  No  Handed:  Right  AIMS (if indicated):     Assets:  Communication Skills Desire for Improvement  ADL's:  Intact  Cognition: WNL  Sleep:  Number of Hours: 7   Treatment Plan Summary: Daily contact with patient to assess and evaluate symptoms and progress in treatment and Medication management   Mr. Lopardo is a 49 year old male with a history of depression and psychosis admitting floridly psychotic and depressed in the context of medication noncompliance and severe social stressors.  1. Suicidal ideation. He continues to feel suicidal butr able to contract for safety in the hospital.  2. Mood/psychosis. Continue Effexor XR for depression and Seroquel for psychosis. Will decrease Effexor to 150 and start Luvox for obsessive thoughts. Will increase Seroquel to 400 mg to address psychosis.   3. Anxiety: Patient has documented history of anxiolytic dependence. He completed Librium taper.  4. Hypertension: Continue Toprol-XL and Norvasc.  5. Constipation. Continue Colace and Senokot.   6. Insomnia. He is on Restoril. Slept 7 hours.  7. Metabolic syndrome. Lipid panel is not elevated. TSH normal.   8. Headaches: This is an ongoing concern of of this patient. He says that his headaches are debilitating. Head CT was negative. MRI of the brain with and without contrast was completed on December 8 and he was negative.  9. B12 defficiency. We started Vit B12 injections.   10. ADLs. Occupational therapy consult was completed. It appears that low energy and tiredness are major obstacles.   11. Cognitive decline. Psychological consultation completed and did not detect gross cognitive difficulties. Please see results.This appears to be his baseline.   12. Hypogonadism. We ordered testosterone injections  that the patient now refuses.  13. Disposition. The patient has been referred to [facility] hospital.   Terrel Nesheiwat 05/04/2015, 3:24 PM

## 2015-05-04 NOTE — Progress Notes (Signed)
Recreation Therapy Notes  Date: 12.13.16 Time: 3:00 pm Location: Craft Room  Group Topic: Self-expression  Goal Area(s) Addresses:  Patient will effectively use art as a means of self-expression. Patient will recognize positive benefit of self-expression. Patient will be able to identify one emotion experienced during group session. Patient will identify use of art/self-expression as a coping skill.  Behavioral Response: Did not attend  Intervention: Two Faces of Me  Activity: Patients were given a blank face worksheet and instructed to draw a line down the middle. On one side, patients were instructed to draw or write how they felt when they were admitted to the hospital and on the other side they were instructed to draw or write how they want to feel when they are d/c.  Education: LRT educated patient on different forms of self-expression.  Education Outcome: Patient did not attend group.  Clinical Observations/Feedback: Patient did not attend group.  Leonette Monarch, LRT/CTRS 05/04/2015 4:23 PM

## 2015-05-04 NOTE — Plan of Care (Signed)
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

## 2015-05-04 NOTE — Progress Notes (Signed)
D: Patient alert and orientedx3. Still showing somatic features. 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 He also states he's breaking out from not wearing shoes. He states there's holes in socks which causes what's on the floor to get in his body. States pain at a 10. Reports passive SI but contracts for safety.  A: Medication was provided with education. Encouragement given.  R: Patient compliant with his medication. He took his colace which he has been refusing. Safety maintained with 15 min checks.

## 2015-05-04 NOTE — Tx Team (Signed)
Interdisciplinary Treatment Plan Update (Adult)  Date:  05/04/2015 Time Reviewed:  11:19 AM  Progress in Treatment: Attending groups: No. Participating in groups:  No. Taking medication as prescribed:  Yes. Tolerating medication:  Yes. Family/Significant othe contact made:  Yes, individual(s) contacted:  Ex-Wife [name] Patient understands diagnosis:  No. Discussing patient identified problems/goals with staff:  Yes. Medical problems stabilized or resolved:  Yes. Denies suicidal/homicidal ideation: No. Issues/concerns per patient self-inventory:  No. Other:  New problem(s) identified: No, Describe:     Discharge Plan or Barriers:  Pt remains on [place] wait list.  They state that he is approaching top of list.  He did agree to allow referral to [facility].  Pt difficult to discharge as he is homeless and at this time not functioning well enough to be safe in community.  Reason for Continuation of Hospitalization: Anxiety Delusions  Depression Suicidal ideation  Comments: Mr. Veasley continues to be very somatic. He complains of headache, problems with his bowels and poor energy and believes that he needs to be transferred to medical floor. He is still depressed. He believes that his memory is gone even though he was formally tested and no deficits were identified. He thinks that he is unable to take care of himself. He thinks that he be placed in the nursing home. When confronted about his discharge plan the patient reported suicidal ideation. He was unable to identify the plan that reportedly is thinking all the time how to kill himself when discharged. He refused testosterone injection today. He initially reported that he felt much better when on testosterone. Today she believes that he has not testosterone receptors that medications will not work  Estimated length of stay: 7 days  New goal(s):  Review of initial/current patient goals per problem list:   See Plan of  Care  Attendees: Patient:  Jeremiah [name] 12/13/201611:19 AM  Family:   12/13/201611:19 AM  Physician:  Orson Slick 12/13/201611:19 AM  Nursing:   Polly Cobia, RN 12/13/201611:19 AM  Case Manager:   12/13/201611:19 AM  Counselor:  Dossie Arbour, LCSW 12/13/201611:19 AM  Other:  Everitt Amber, [place] 12/13/201611:19 AM  Other:   12/13/201611:19 AM  Other:   12/13/201611:19 AM  Other:  12/13/201611:19 AM  Other:  12/13/201611:19 AM  Other:  12/13/201611:19 AM  Other:  12/13/201611:19 AM  Other:  12/13/201611:19 AM  Other:  12/13/201611:19 AM  Other:   12/13/201611:19 AM   Scribe for Treatment Team:   August Saucer, 05/04/2015, 11:19 AM, MSW, LCSW

## 2015-05-04 NOTE — Plan of Care (Signed)
Problem: Alteration in thought process Goal: LTG-Patient has not harmed self or others in at least 2 days Outcome: Progressing Patient has remained free from harm since admission

## 2015-05-05 MED ORDER — CYANOCOBALAMIN 1000 MCG/ML IJ SOLN: 1000.0000 ug | Freq: Every day | INTRAMUSCULAR | Status: DC

## 2015-05-05 MED ORDER — TEMAZEPAM 15 MG PO CAPS: 15.0000 mg | ORAL_CAPSULE | Freq: Every day | ORAL | Status: DC

## 2015-05-05 MED ORDER — QUETIAPINE FUMARATE 400 MG PO TABS: 400.0000 mg | ORAL_TABLET | Freq: Every day | ORAL | Status: DC

## 2015-05-05 MED ORDER — METOPROLOL SUCCINATE ER 50 MG PO TB24: 50.0000 mg | ORAL_TABLET | Freq: Every day | ORAL | Status: DC

## 2015-05-05 MED ORDER — VENLAFAXINE HCL ER 150 MG PO CP24: 150.0000 mg | ORAL_CAPSULE | Freq: Every day | ORAL | Status: DC

## 2015-05-05 MED ORDER — FLUVOXAMINE MALEATE 100 MG PO TABS: 100.0000 mg | ORAL_TABLET | Freq: Every day | ORAL | Status: DC

## 2015-05-05 NOTE — Discharge Summary (Addendum)
Physician Discharge Summary Note  Patient:  Jeremiah [name] is an 49 y.o., male MRN:  [id number] DOB:  [birth year] Patient phone:  [phone] (home)  Patient address:   [place] [address],  Total Time spent with patient: 30 minutes  Date of Admission:  03/30/2015 Date of Discharge: 05/05/2015  Reason for Admission:  Suicidal ideation.  Identifying data. Mr. Allegro is a 49 year old male with history of depression and mood instability.  Chief complaint."I'm dying."  History of present illness. Information was obtained from the patient's chart. At the patient has a long history of depression and mood instability. He carries multiple diagnoses of depression, schizophrenia, bipolar disorder, autistic spectrum disorder, and anxiety disorder. The patient is not a good historian and rather disorganized today unable to provide much information apparently she stopped taking all his medications except for the Xanax and became increasingly depressed and disorganized, unable to care for himself. He became homeless as he burned many bridges. He no longer could stay with a family member and try to stay with his ex-wife who called the police. The patient is focused on his somatic complaints mostly constipation and insomnia. He claims that he did not have a bowel movement or slept in months. He reports many depressive symptoms with poor sleep, decreased appetite with weight loss, anhedonia, feeling of guilt and hopelessness worthlessness, poor energy and concentration social isolation and persistent intrusive thoughts of hurting himself.He did not specify the plan. He denies psychotic symptoms most likely is delusional with somatic delusions. He reports heightened anxiety in his opinion is helped by Xanax only. He denies symptoms suggestive of bipolar mania. He denies alcohol or illicit substance use. His drug screen was positive for benzodiazepines only.  Past psychiatric history. As  above he carries multiple diagnoses. He was hospitalized at [facility] at least twice before. The patient is not a good historian and unable to give me any details of his medication history. She denies ever attempting suicide. Upon further questioning the patient has all his problems started in October 2015 when he suffered severe head injury that led to depression, mood instability, cognitive decline. He used to be evaluated regarding his high school but now has difficulties remembering everyday stuff. He was diagnosed with dementia praecox. He also believes that all his somatic problems constipation and urinary tract infection and insomnia targeted at the same time.  Family psychiatric history. None reported.  Social history. He was married twice. He did well in school. He used to work as a Electronics engineer. He became dysfunctional following head injury. He is homeless, uninsured, with no social support. He applied for disability but was turned down and called a Risk manager.  Principal Problem: Major depressive disorder, recurrent episode, severe, with psychosis [facility]) Discharge Diagnoses: Patient Active Problem List   Diagnosis Date Noted  . Vitamin B12 deficiency [E53.8] 04/30/2015  . 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 Major depressive disorder, recurrent episode, severe, with psychosis ([place]) [F33.3] 04/29/2015  . Cervical pain [M54.2] 01/18/2015  . 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 GAD (generalized anxiety disorder) [F41.1] 10/19/2014  . Benign essential HTN [I10] 08/27/2014  . 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  Past Medical History:  Past Medical History  Diagnosis Date  . Hypertension   . Rectal fistula   . Dysuria   . Diverticulosis   . Gastroparesis   . Borderline personality disorder   . Major depression (  [place])   . Schizophrenia ([place])   . Hypokalemia   . Microscopic  hematuria   . External hemorrhoid   . Over weight   . Nicotine addiction   . Difficulty urinating   . Stroke ([place])   . Benign essential HTN 08/27/2014  . BP (high blood pressure) 01/16/2015  . Anxiety     Past Surgical History  Procedure Laterality Date  . Lumbar spine surgery    . Colonoscopy    . Colonoscopy with propofol N/A 10/12/2014    Procedure: COLONOSCOPY WITH PROPOFOL;  Surgeon: Hulen Luster, MD;  Location: [facility] ENDOSCOPY;  Service: Gastroenterology;  Laterality: N/A;  . Tonsillectomy    . Back surgery     Family History:  Family History  Problem Relation Age of Onset  . Stroke Mother   . Stroke Maternal Grandmother   . Diabetes Mellitus II Sister    Family Psychiatric  History:  none reported. Social History:  History  Alcohol Use No    Comment: last drink was in march 2016     History  Drug Use No    Social History   Social History  . Marital Status: Single    Spouse Name: N/A  . Number of Children: N/A  . Years of Education: N/A   Social History Main Topics  . Smoking status: Former Smoker    Types: Cigarettes    Quit date: 08/19/2014  . Smokeless tobacco: None  . Alcohol Use: No     Comment: last drink was in march 2016  . Drug Use: No  . Sexual Activity: Not Asked   Other Topics Concern  . None   Social History Narrative    Hospital Course:    Mr. Helming is a 49 year old male with a history of depression and psychosis admitting floridly psychotic and depressed in the context of medication noncompliance and severe social stressors.  1. Suicidal ideation. He continues to feel suicidal and plans to kill himself upon discharge but is able to contract for safety in the hospital.  2. Mood/psychosis. We restarted Effexor XR for depression and Abilify for psychosis. Abilify was switched to Seroquel due to poor response. Luvox was added for obsessive thoughts on 05/04/2015.    3. Anxiety: Patient has documented history of anxiolytic dependence. He  completed Librium taper.  4. Hypertension: Continued Toprol-XL and Norvasc.  5. Constipation. Continued Colace and Senokot.   6. Insomnia. This resolved with Restoril. Slept 7 hours.  7. Metabolic syndrome. Lipid panel is not elevated. TSH normal.   8. Headaches: This is an ongoing concern of of this patient. He says that his headaches are debilitating. Head CT on admission and MRI of the brain with and without contrast was completed on December 8 were both negative.  9. B12 defficiency. He was started on Vit B12 injections on 04/30/2015.  10. ADLs. Occupational therapy consult was completed. It appears that low energy and tiredness are major obstacles.   11. Cognitive decline. Psychological consultation completed and did not detect gross cognitive difficulties. Please see results.This appears to be his baseline.   12. Hypogonadism. We ordered testosterone injections that the patient now refuses.  13. Disposition. The patient was transferred to [facility] hospital for further treatment.  Physical Findings: AIMS:  , ,  ,  ,    CIWA:    COWS:     Musculoskeletal: Strength & Muscle Tone: within normal limits Gait & Station: normal Patient leans: N/A  Psychiatric Specialty Exam: Review of  Systems  Gastrointestinal: Positive for constipation.  Neurological: Positive for headaches.  All other systems reviewed and are negative.   Blood pressure 94/77, pulse 78, temperature 97.9 F (36.6 C), temperature source Oral, resp. rate 18, height 5\' 7"  (1.702 m), weight 67.132 kg (148 lb), SpO2 98 %.Body mass index is 23.17 kg/(m^2).  See SRA.                                                  Sleep:  Number of Hours: 7   Have you used any form of tobacco in the last 30 days? (Cigarettes, Smokeless Tobacco, Cigars, and/or Pipes): No  Has this patient used any form of tobacco in the last 30 days? (Cigarettes, Smokeless Tobacco, Cigars, and/or Pipes) Yes,  No  Metabolic Disorder Labs:  Lab Results  Component Value Date   HGBA1C 5.1 03/29/2015   MPG 117 10/20/2014   No results found for: PROLACTIN Lab Results  Component Value Date   CHOL 166 03/29/2015   TRIG 84 03/29/2015   HDL 44 03/29/2015   CHOLHDL 3.8 03/29/2015   VLDL 17 03/29/2015   LDLCALC 105* 03/29/2015   LDLCALC 77 10/20/2014    See Psychiatric Specialty Exam and Suicide Risk Assessment completed by Attending Physician prior to discharge.  [facility]  Is patient on multiple antipsychotic therapies at discharge:  No   Has Patient had three or more failed trials of antipsychotic monotherapy by history:  No  Recommended Plan for Multiple Antipsychotic Therapies: NA  Discharge Instructions    Diet - low sodium heart healthy    Complete by:  As directed      Increase activity slowly    Complete by:  As directed             Medication List    STOP taking these medications        ALPRAZolam 1 MG tablet  Commonly known as:  XANAX     cloNIDine 0.2 MG tablet  Commonly known as:  CATAPRES     divalproex 500 MG 24 hr tablet  Commonly known as:  DEPAKOTE ER     Melatonin 10 MG Tabs     sertraline 100 MG tablet  Commonly known as:  ZOLOFT     sulfamethoxazole-trimethoprim 800-160 MG tablet  Commonly known as:  BACTRIM DS,SEPTRA DS     zolpidem 10 MG tablet  Commonly known as:  AMBIEN      TAKE these medications      Indication   amLODipine 5 MG tablet  Commonly known as:  NORVASC  Take 1 tablet (5 mg total) by mouth daily.   Indication:  High Blood Pressure     bisacodyl 5 MG EC tablet  Commonly known as:  DULCOLAX  Take 5 mg by mouth daily as needed for mild constipation or moderate constipation (constipation).      cyanocobalamin 1000 MCG/ML injection  Commonly known as:  (VITAMIN B-12)  Inject 1 mL (1,000 mcg total) into the muscle daily.      fluvoxaMINE 100 MG tablet  Commonly known as:  LUVOX  Take 1 tablet  (100 mg total) by mouth at bedtime.   Indication:  Depression     hydrOXYzine 50 MG tablet  Commonly known as:  ATARAX/VISTARIL  Take 2 tablets (100 mg total) by mouth at  bedtime as needed (sleep).   Indication:  insomnia     magnesium hydroxide 400 MG/5ML suspension  Commonly known as:  MILK OF MAGNESIA  Take 30 mLs by mouth daily as needed for mild constipation.      metoprolol succinate 50 MG 24 hr tablet  Commonly known as:  TOPROL-XL  Take 1 tablet (50 mg total) by mouth daily.   Indication:  High Blood Pressure     polyethylene glycol packet  Commonly known as:  MIRALAX  Take 17 g by mouth daily.   Indication:  Constipation     QUEtiapine 400 MG tablet  Commonly known as:  SEROQUEL  Take 1 tablet (400 mg total) by mouth at bedtime.   Indication:  Obsessive Compulsive Disorder     temazepam 15 MG capsule  Commonly known as:  RESTORIL  Take 1 capsule (15 mg total) by mouth at bedtime.      venlafaxine XR 150 MG 24 hr capsule  Commonly known as:  EFFEXOR-XR  Take 1 capsule (150 mg total) by mouth daily with breakfast.   Indication:  Major Depressive Disorder         Follow-up recommendations:  Activity:  As tolerated.  Diet:  Low sodium heart healthy. Other:  Keep follow-up appointments.  Comments:  See Psychological consultation.  Psychological Assessment   Name: Marce Goodlow Age: 25 Date of Evaluation: 04-05-15 Test(s) Administered: Rebeca Alert [employer] Part A & B  Reason for Referral: Mr. Lemarr was referred for a psychological assessment by his physician, Orson Slick, MD. He was admitted to [facility] for the treatment of depression and somatic complaints. Please see the history and physical and psychosocial history for further background information. He has a long psychiatric history and has been diagnosed with major depression, schizophrenia, bipolar disorder, autism spectrum disorder and personality disorder.  He  reports a fall last October and increased confusion, and problems with memory since then. He reports he stopped driving because he felt he had short term memory problems. He has not contacted any professional for help with this issues. A neuro psychological screening was requested to determine if cognitive deficits may be contributing to his present difficulties.  Validity: Mr. Depa was pleasant and cooperative with the testing process. He attempted all tasks requested of him and appeared to try his best. The present evaluation is considered a valid indication of current functioning.  Results of Testing:  [name] - Mr. Friedley did not obtain any errors on this assessment. His performance was within expected limits with no evidence of brain impairment.  Trail Making Test - Part A: He completed the task in 36 seconds. Expected performance is 27-39 seconds. No evidence of brain impairment is noted.  Trail Making Test - Part B: He completed the task in 88 seconds. Expected performance is 66-86 seconds. He placed in the mild to moderate brain impairment category. Given his performance on the other assessments this score is not suggestive of brain impairment.  Diagnostic Impression: No evidence of brain impairment   Signed: Jolanta Pucilowska 05/05/2015, 10:41 AM

## 2015-05-05 NOTE — Progress Notes (Signed)
Recreation Therapy Notes  INPATIENT RECREATION TR PLAN  Patient Details Name: Jeremiah [name] MRN: [id number] DOB: Aug 22, 1965 Today's Date: 05/05/2015  Rec Therapy Plan Is patient appropriate for Therapeutic Recreation?: Yes Treatment times per week: At least once a week TR Treatment/Interventions: 1:1 session, Group participation (Comment) (Appropriate participation in daily recreation therapy tx)  Discharge Criteria Pt will be discharged from therapy if:: Treatment goals are met, Discharged Treatment plan/goals/alternatives discussed and agreed upon by:: Patient/family  Discharge Summary Short term goals set: See Care Plan Short term goals met: Complete Progress toward goals comments: One-to-one attended Which groups?: Self-esteem, Coping skills, Other (Comment), Goal setting (Self-expression) One-to-one attended: Self-esteem, stress management Reason goals not met: N/A Therapeutic equipment acquired: None Reason patient discharged from therapy: Discharge from hospital Pt/family agrees with progress & goals achieved: Yes Date patient discharged from therapy: 05/05/15   Leonette Monarch, LRT/CTRS 05/05/2015, 4:51 PM

## 2015-05-05 NOTE — Progress Notes (Signed)
D: Observed pt in room lying awake.. Patient alert and oriented x4. Patient endorses passive SI, but contracts for safety. Pt denies HI/AVH. Pt affect is blunted and anxious. Pt continues to have somatic delusions, complaining of extreme stomach pain, stomach ulcer, brain herniation. Pt is worried that he should be in he ER, and that he does not have a mental health issue. Pt is isolative to room most of evening.  A: Offered active listening and support. Provided therapeutic communication. Administered scheduled medications. Reinforced pt that he is in a safe environment with medical professionals regularly assessing him.  R: Pt compliant with medications. Pt cooperative. Will continue Q15 min. Checks. Safety maintained.

## 2015-05-05 NOTE — Progress Notes (Signed)
D:Patient aware of discharge this shift . Patient returning home . Patient received all belonging locked up . Patient denies  Suicidal  And homicidal ideations  .  A: Writer instructed on discharge criteria  .  Aware  Of follow up appointment to [facility]  . R: Patient left unit with no questions with sheriff

## 2015-05-05 NOTE — BHH Group Notes (Signed)
[facility] LCSW Aftercare Discharge Planning Group Note  05/05/2015 9:15 AM  Participation Quality: Did Not Attend. Patient invited to participate but declined.   Alphonse Guild. Krislyn Donnan, MSW, LCSWA, LCAS

## 2015-05-05 NOTE — Plan of Care (Signed)
Problem: Alteration in thought process Goal: LTG-Patient has not harmed self or others in at least 2 days Outcome: Progressing Pt has remained free from self harm since admission.

## 2015-05-05 NOTE — BHH Suicide Risk Assessment (Signed)
[facility] Discharge Suicide Risk Assessment   Demographic Factors:  Male, Divorced or widowed, Caucasian, Low socioeconomic status and Unemployed  Total Time spent with patient: 30 minutes  Musculoskeletal: Strength & Muscle Tone: within normal limits Gait & Station: normal Patient leans: N/A  Psychiatric Specialty Exam: Physical Exam  Review of Systems  Gastrointestinal: Positive for constipation.  Musculoskeletal: Positive for back pain.  Neurological: Positive for headaches.  All other systems reviewed and are negative.   Blood pressure 94/77, pulse 78, temperature 97.9 F (36.6 C), temperature source Oral, resp. rate 18, height 5\' 7"  (1.702 m), weight 67.132 kg (148 lb), SpO2 98 %.Body mass index is 23.17 kg/(m^2).  General Appearance: Disheveled  Eye Contact::  Fair  Speech:  [phone]  Volume:  Decreased  Mood:  Depressed, Hopeless and Worthless  Affect:  Flat  Thought Process:  Irrelevant  Orientation:  Full (Time, Place, and Person)  Thought Content:  Delusions and Paranoid Ideation  Suicidal Thoughts:  Yes.  with intent/plan  Homicidal Thoughts:  No  Memory:  Immediate;   Fair Recent;   Fair Remote;   Fair  Judgement:  Poor  Insight:  Lacking  Psychomotor Activity:  Decreased  Concentration:  Fair  Recall:  [employer] of Knowledge:Fair  Language: Fair  Akathisia:  No  Handed:  Right  AIMS (if indicated):     Assets:  Communication Skills Desire for Improvement Resilience  Sleep:  Number of Hours: 7  Cognition: WNL  ADL's:  Intact   Have you used any form of tobacco in the last 30 days? (Cigarettes, Smokeless Tobacco, Cigars, and/or Pipes): No  Has this patient used any form of tobacco in the last 30 days? (Cigarettes, Smokeless Tobacco, Cigars, and/or Pipes) N/A  Mental Status Per Nursing Assessment::   On Admission:  NA  Current Mental Status by Physician: Suicide ideation indicated by: Patient  Loss Factors: Decrease in vocational status, Loss of  significant relationship, Decline in physical health and Financial problems/change in socioeconomic status  Historical Factors: NA  Risk Reduction Factors:   Sense of responsibility to family  Continued Clinical Symptoms:  Depression:   Severe  Cognitive Features That Contribute To Risk:  None    Suicide Risk:  Moderate:  Frequent suicidal ideation with limited intensity, and duration, some specificity in terms of plans, no associated intent, good self-control, limited dysphoria/symptomatology, some risk factors present, and identifiable protective factors, including available and accessible social support.  Principal Problem: Major depressive disorder, recurrent episode, severe, with psychosis [facility]) Discharge Diagnoses:  Patient Active Problem List   Diagnosis Date Noted  . Vitamin B12 deficiency [E53.8] 04/30/2015  . 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 Major depressive disorder, recurrent episode, severe, with psychosis ([place]) [F33.3] 04/29/2015  . Cervical pain [M54.2] 01/18/2015  . 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 GAD (generalized anxiety disorder) [F41.1] 10/19/2014  . Benign essential HTN [I10] 08/27/2014  . 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 Diet:  Low sodium heart healthy. Other:  P follow-up appointments.  Is patient on multiple antipsychotic therapies at discharge:  No   Has Patient had three or more failed trials of antipsychotic monotherapy by history:  No  Recommended Plan for Multiple Antipsychotic Therapies: NA    Jolanta Pucilowska 05/05/2015, 10:37 AM

## 2015-05-05 NOTE — Progress Notes (Signed)
  [facility] Adult Case Management Discharge Plan :  Will you be returning to the same living situation after discharge:  No. At discharge, do you have transportation home?: Yes,  Sheriff  Do you have the ability to pay for your medications: Yes,  [facility]   Release of information consent forms completed and in the chart;  Patient's signature needed at discharge.  Patient to Follow up at: Follow-up Information    Follow up with [facility]  On 05/05/2015.   Why:  Transferred to [place].    Contact information:   [address]  [place], [place] [address] Phone: [phone] Fax: [phone]      Next level of care provider has access to [facility] Link:no  Patient denies SI/HI: No.    Safety Planning and Suicide Prevention discussed: Yes,  with patient and ex- wife  Have you used any form of tobacco in the last 30 days? (Cigarettes, Smokeless Tobacco, Cigars, and/or Pipes): No  Has patient been referred to the Quitline?: N/A patient is not a smoker  Patient has been referred for addiction treatment: [place] MSW, [place]  05/05/2015, 11:46 AM

## 2015-05-05 NOTE — BHH Group Notes (Signed)
[place] Group Notes:  (Nursing/MHT/Case Management/Adjunct)  Date:  05/05/2015  Time:  12:29 PM  Type of Therapy:  Psychoeducational Skills  Participation Level:  Active  Participation Quality:  Appropriate, Attentive and Sharing  Affect:  Appropriate  Cognitive:  Alert and Appropriate  Insight:  Appropriate  Engagement in Group:  Engaged  Modes of Intervention:  Discussion, Education and Support  Summary of Progress/Problems:  Adela Lank [facility] 05/05/2015, 12:29 PM

## 2015-05-05 NOTE — Progress Notes (Signed)
Patient ID: Jeremiah [name], male   DOB: November 26, 1965, 49 y.o.   MRN: [id number]  CSW informed Algie Coffer, Care Coordinator [phone]) of pt's transfer to [facility].   [place] MSW, [place]  05/05/2015 2:45 PM

## 2015-08-02 IMAGING — CR DG ABDOMEN 3V
1 series · 4 of 4 positions shown · non-contrast
Comparison: CT abdomen 04/01/2012.  Abdomen 11/06/2011

CLINICAL DATA: Intestinal blockage. Patient feels constipated since
07/25/2014. Rectal pain and burning.

EXAM:
ABDOMEN SERIES

[Series 1: w chest pa · 0.14mm/px · 4 of 4 slices shown]
[im 1/4]
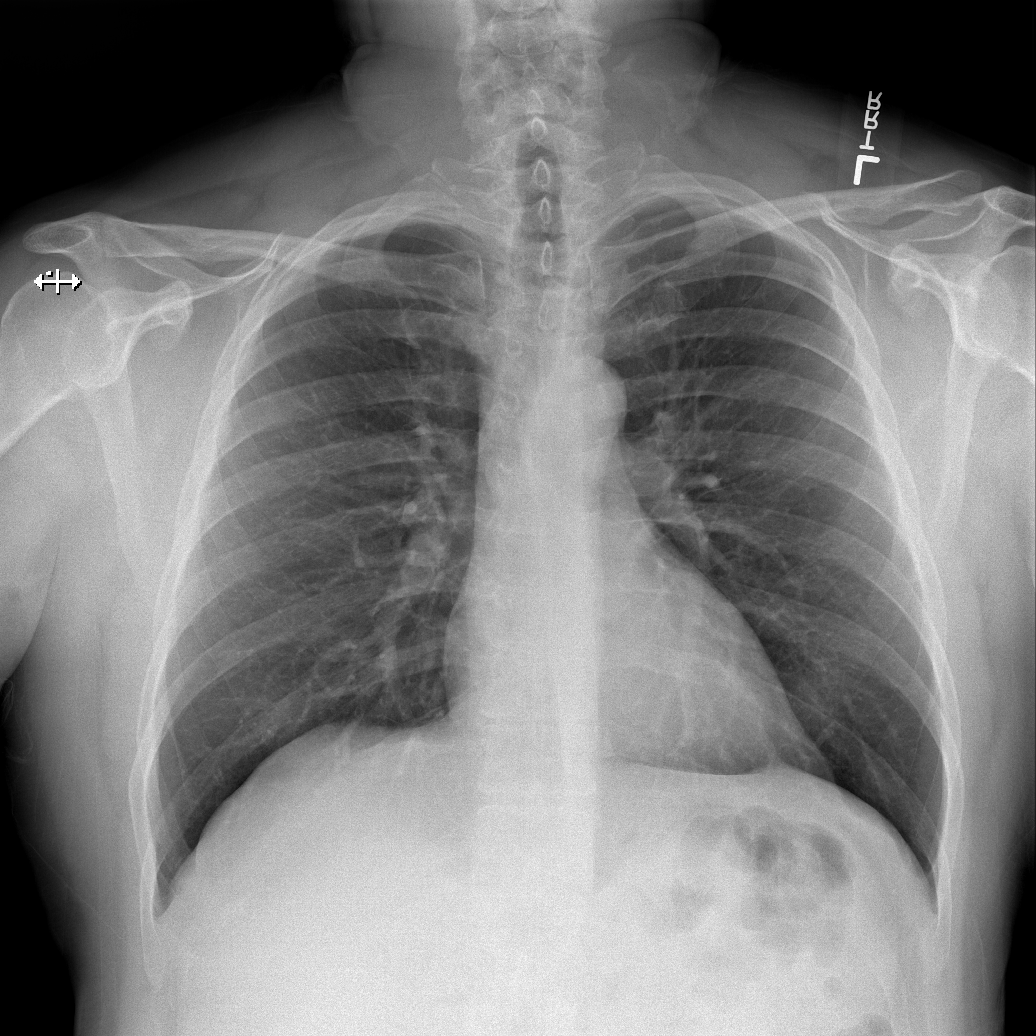
[im 2/4]
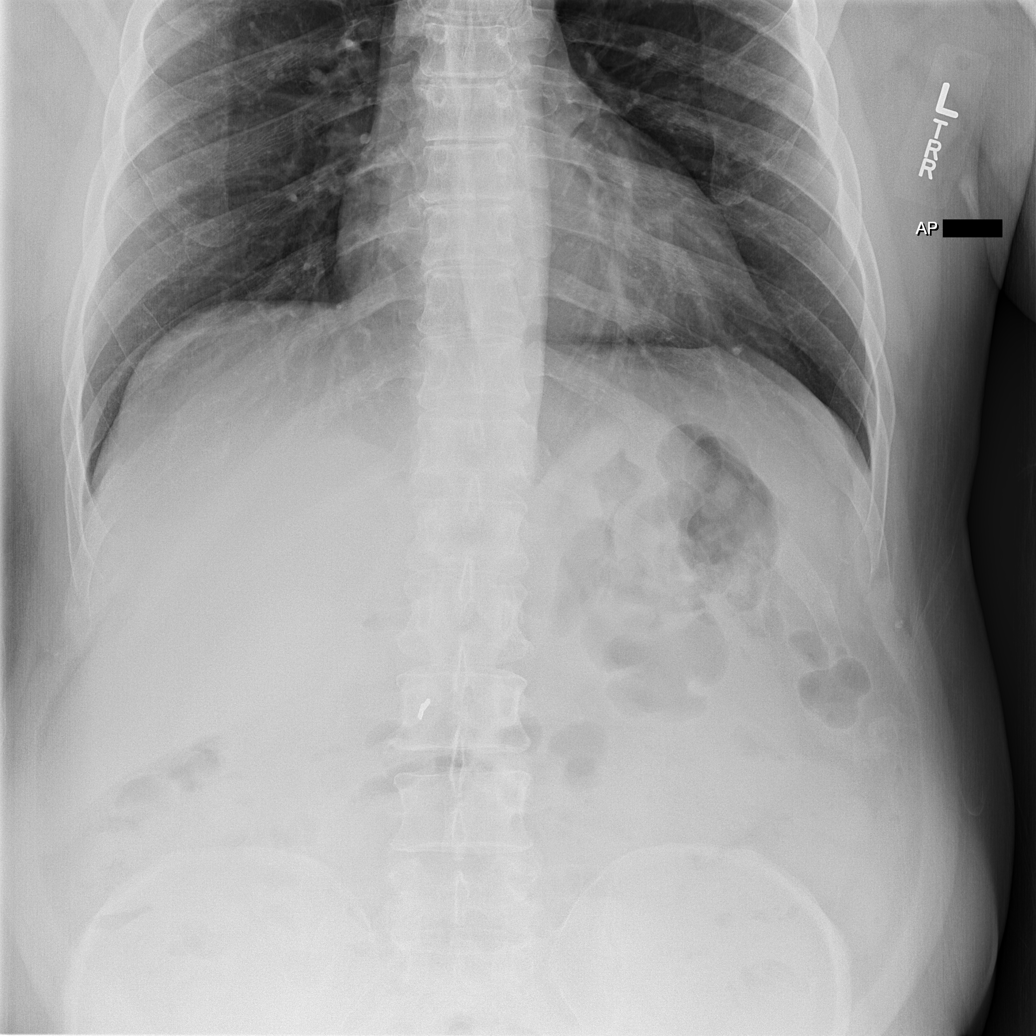
[im 3/4]
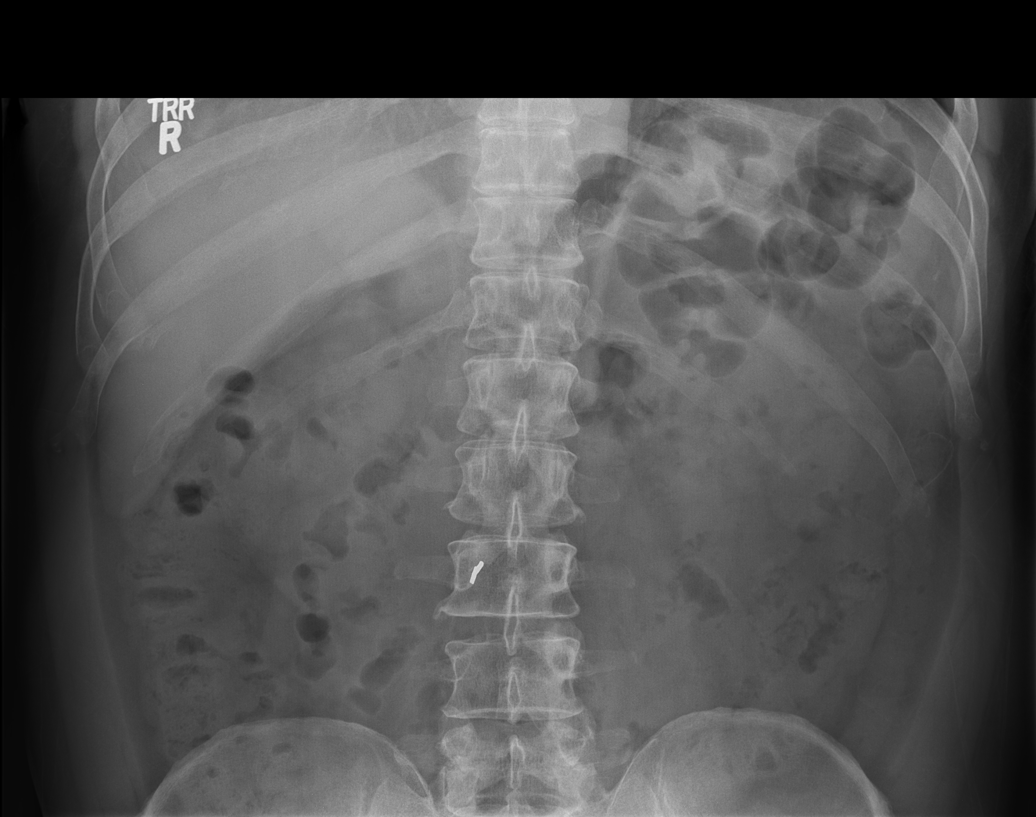
[im 4/4]
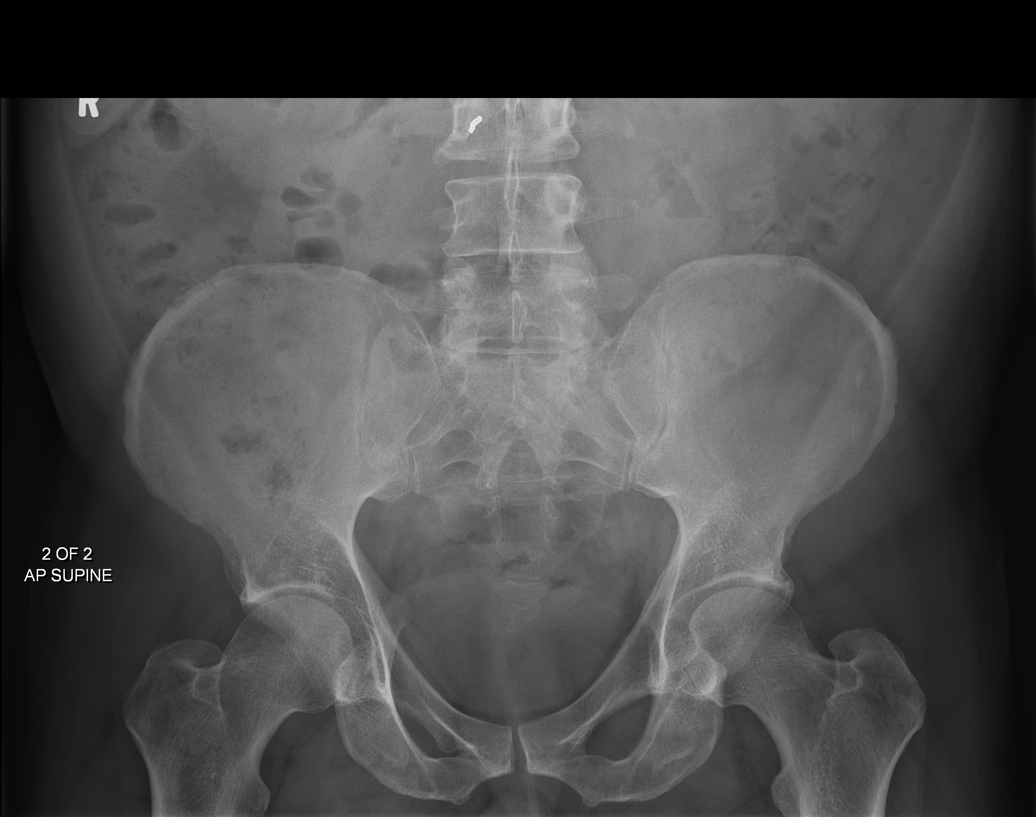

[4 of 4 positions shown; findings below may reference images not displayed]

FINDINGS: Normal heart size and pulmonary vascularity. No focal airspace
disease or consolidation in the lungs. No blunting of costophrenic
angles. No pneumothorax. Mediastinal contours appear intact.

Scattered gas and stool in the colon. No small or large bowel
distention. No free intra-abdominal air. No abnormal air-fluid
levels. No radiopaque stones. Visualized bones appear intact.
Surgical clip projected over L3.
IMPRESSION: No evidence of active pulmonary disease. Normal nonobstructive bowel
gas pattern.

## 2015-08-30 ENCOUNTER — Encounter: Payer: Self-pay | Admitting: Urology

## 2015-08-30 ENCOUNTER — Ambulatory Visit: Payer: Self-pay

## 2015-08-30 ENCOUNTER — Ambulatory Visit: Payer: Self-pay | Admitting: Urology

## 2016-04-03 IMAGING — MR MR HEAD WO/W CM
10 of 12 series · 38 of 48 positions shown · IV contrast (multihance)
Comparison: CT head April 24, 2015

CLINICAL DATA: Debilitating migraines. History of traumatic brain
injury and severe cognitive deficits and, stroke. History of
hypertension.

EXAM:
MRI HEAD WITHOUT AND WITH CONTRAST
TECHNIQUE: Multiplanar, multiecho pulse sequences of the brain and surrounding
structures were obtained without and with intravenous contrast.
CONTRAST:  14mL MULTIHANCE GADOBENATE DIMEGLUMINE 529 MG/ML IV SOLN

[Series 4: DWI · axial · 4.0mm · 0.94mm/px · z∈[-92,+81]mm · 5 of 45 slices shown (1 of 4)]
[im 1/45]
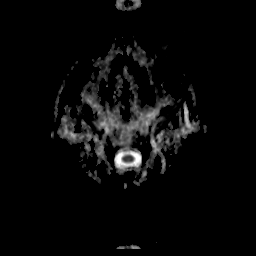
[im 12/45]
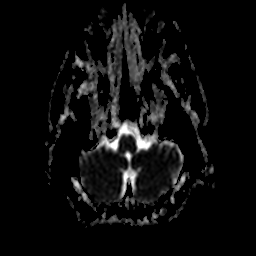
[im 23/45]
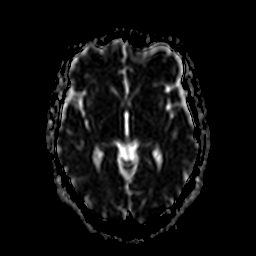
[im 34/45]
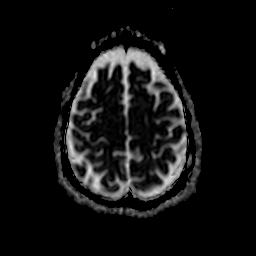
[im 45/45]
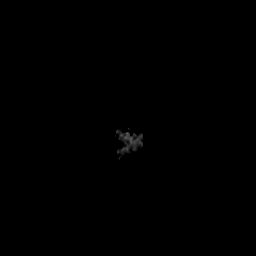

[Series 6: DWI · coronal · 5.0mm · 1.80mm/px · 4 of 43 slices shown (2 of 4)]
[im 1/43]
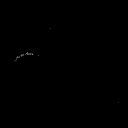
[im 15/43]
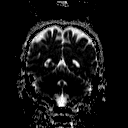
[im 29/43]
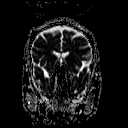
[im 43/43]
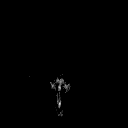

[Series 7: DWI · axial · 4.0mm · 0.94mm/px · z∈[-92,+81]mm · 5 of 45 slices shown (3 of 4)]
[im 1/45]
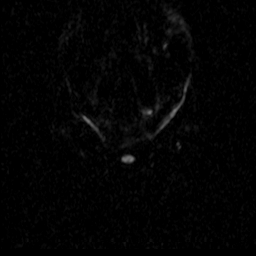
[im 12/45]
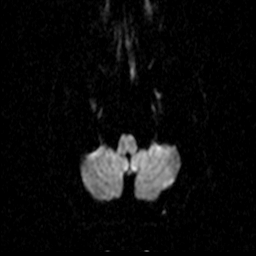
[im 23/45]
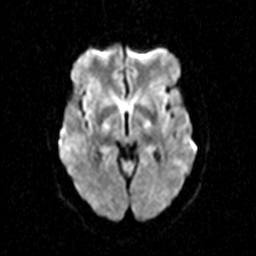
[im 34/45]
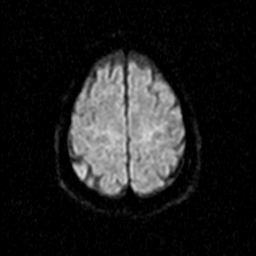
[im 45/45]
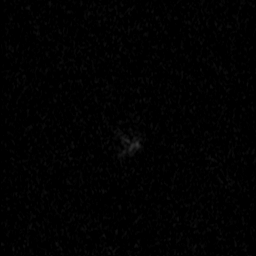

[Series 8: DWI · coronal · 5.0mm · 1.80mm/px · 4 of 42 slices shown (4 of 4)]
[im 1/42]
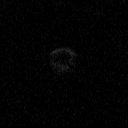
[im 14/42]
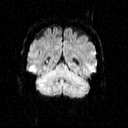
[im 28/42]
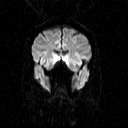
[im 42/42]
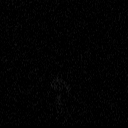

[Series 9: T2 · axial · 5.0mm · 0.45mm/px · z∈[-96,+83]mm · 3 of 29 slices shown (1 of 2)]
[im 1/29]
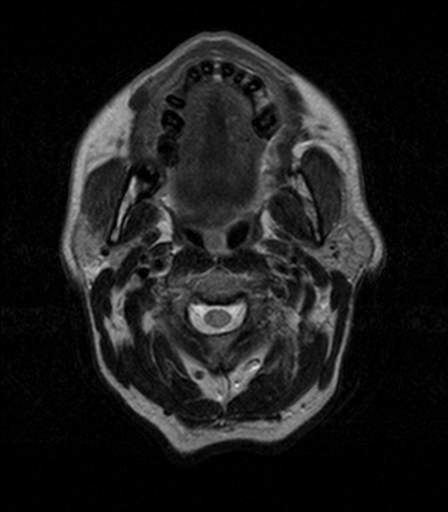
[im 15/29]
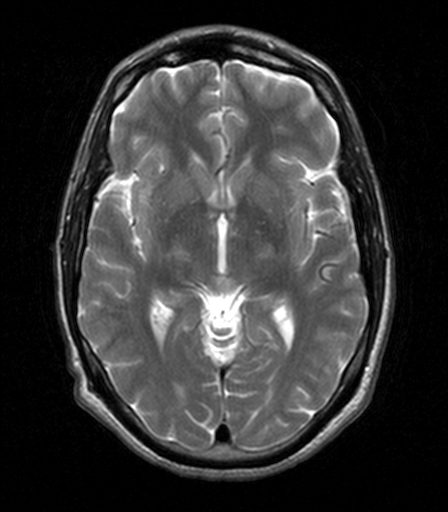
[im 29/29]
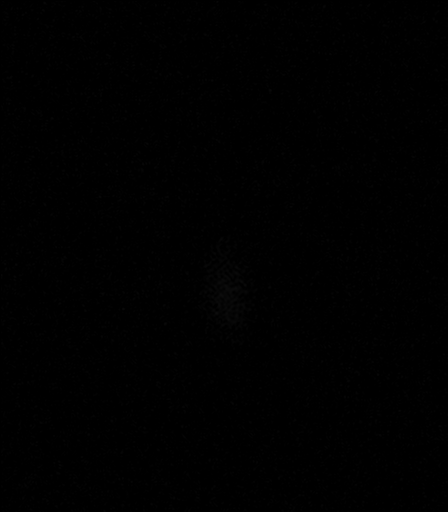

[Series 10: FLAIR · axial · 5.0mm · 0.90mm/px · z∈[-96,+83]mm · 3 of 29 slices shown]
[im 1/29]
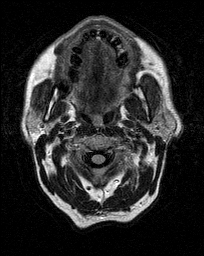
[im 15/29]
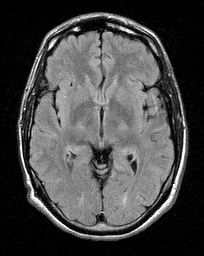
[im 29/29]
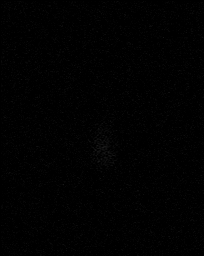

[Series 11: T2 · axial · 5.0mm · 0.45mm/px · z∈[-96,+83]mm · 3 of 29 slices shown (2 of 2)]
[im 1/29]
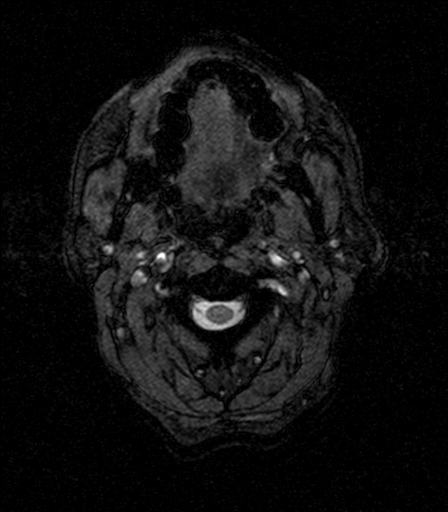
[im 15/29]
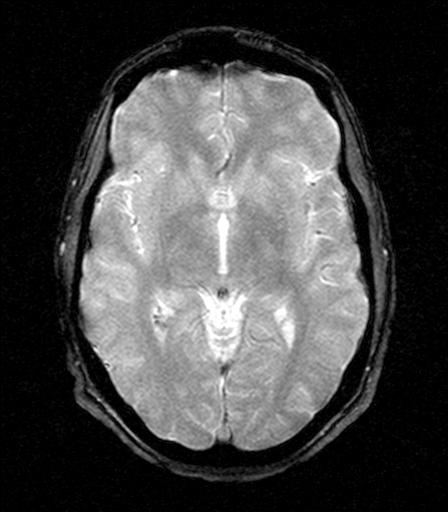
[im 29/29]
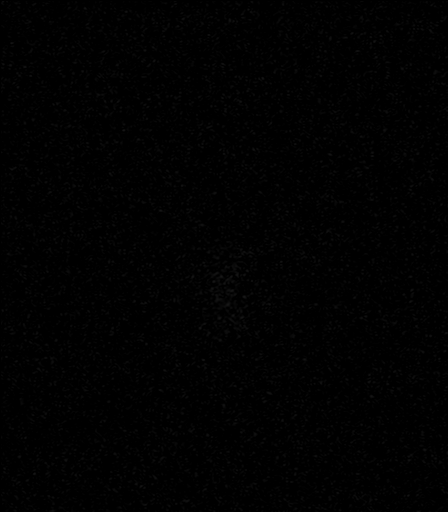

[Series 13: T2 post-contrast · coronal · 5.0mm · 0.45mm/px · 2 of 33 slices shown]
[im 1/33]
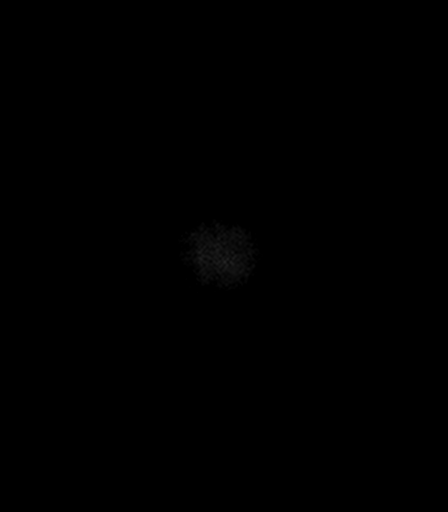
[im 17/33]
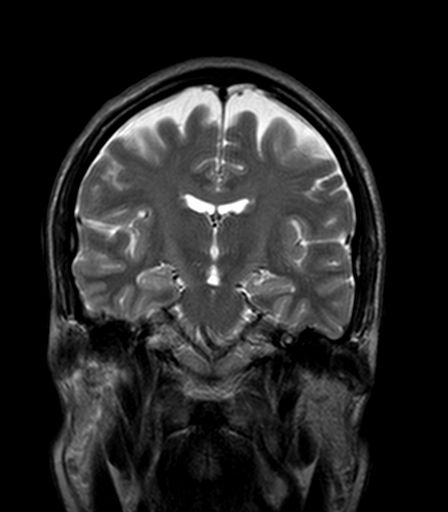

[Series 14: T1 post-contrast · axial · 3.0mm · 0.45mm/px · z∈[-88,+75]mm · 6 of 56 slices shown (1 of 2)]
[im 1/56]
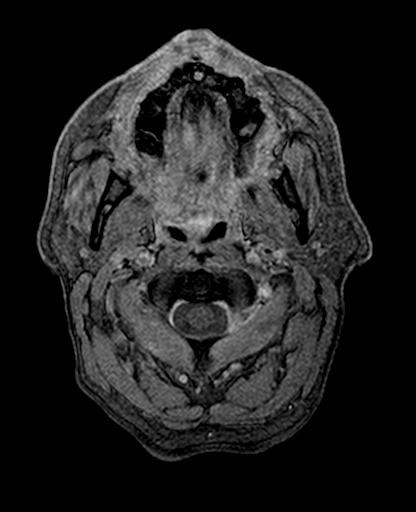
[im 12/56]
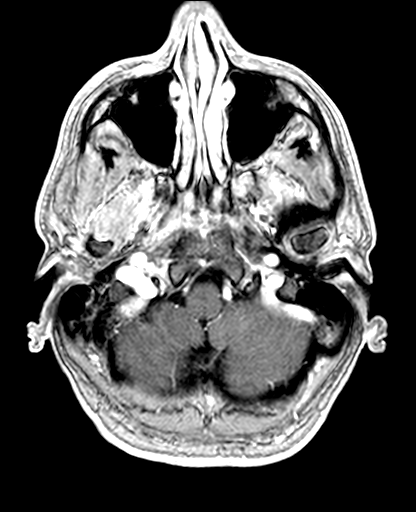
[im 23/56]
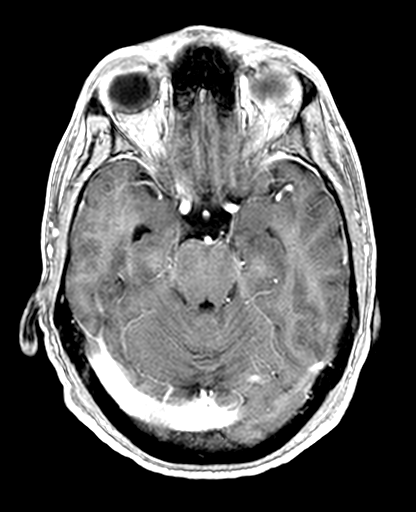
[im 34/56]
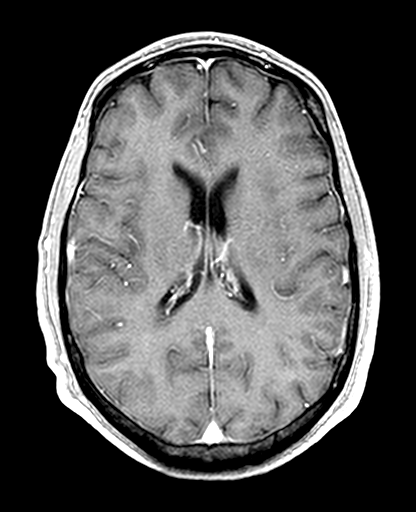
[im 45/56]
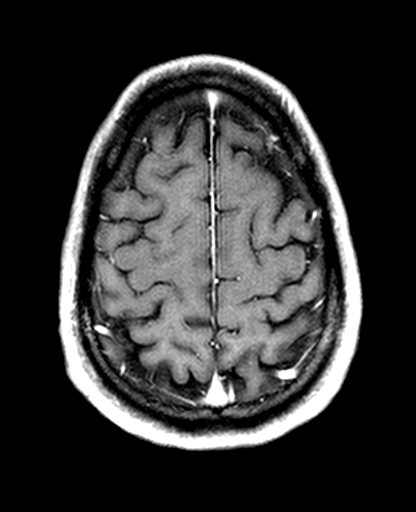
[im 56/56]
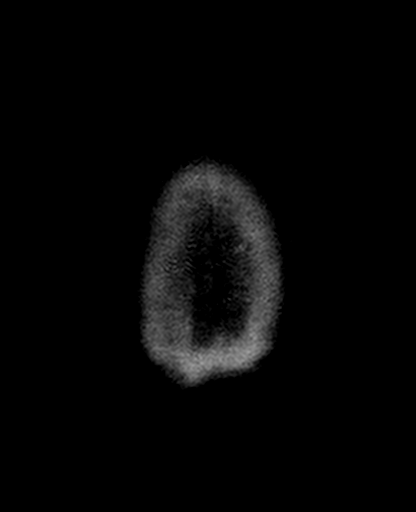

[Series 15: T1 post-contrast · coronal · 5.0mm · 0.45mm/px · 3 of 33 slices shown (2 of 2)]
[im 1/33]
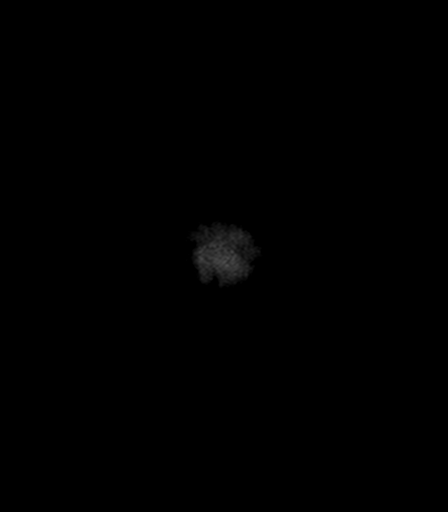
[im 17/33]
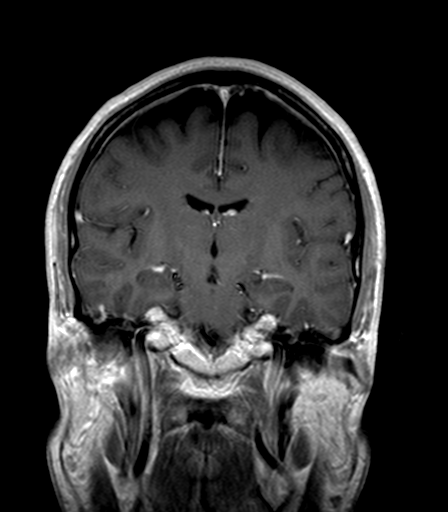
[im 33/33]
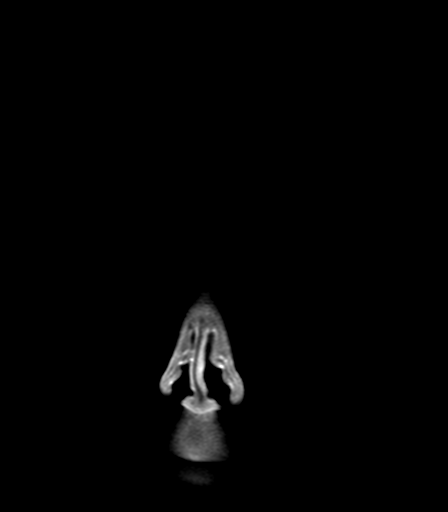

[38 of 48 positions shown; findings below may reference images not displayed]

FINDINGS: Mildly motion degraded examination.

The ventricles and sulci are normal for patient's age. No abnormal
parenchymal signal, mass lesions, mass effect. No abnormal
parenchymal enhancement. No reduced diffusion to suggest acute
ischemia. No susceptibility artifact to suggest hemorrhage.

No abnormal extra-axial fluid collections. No extra-axial masses nor
leptomeningeal enhancement. Normal major intracranial vascular flow
voids seen at the skull base.

Ocular globes and orbital contents are unremarkable though not
tailored for evaluation. No suspicious calvarial bone marrow signal.
No abnormal sellar expansion. Craniocervical junction maintained.
Small LEFT maxillary mucosal retention cysts without paranasal sinus
air-fluid levels. The mastoid air cells are well aerated.
IMPRESSION: Negative mildly motion degraded MRI of the brain with and without
contrast.

## 2016-11-15 DIAGNOSIS — Z8639 Personal history of other endocrine, nutritional and metabolic disease: Secondary | ICD-10-CM | POA: Insufficient documentation

## 2016-11-15 DIAGNOSIS — K5909 Other constipation: Secondary | ICD-10-CM | POA: Insufficient documentation

## 2017-08-04 ENCOUNTER — Encounter (HOSPITAL_COMMUNITY): Payer: Self-pay

## 2017-08-04 ENCOUNTER — Emergency Department (HOSPITAL_COMMUNITY)
Admission: EM | Admit: 2017-08-04 | Discharge: 2017-08-04 | Disposition: A | Discharge status: Home / Self Care | Payer: Medicaid Other | Attending: Physician Assistant | Admitting: Physician Assistant

## 2017-08-04 ENCOUNTER — Emergency Department (HOSPITAL_COMMUNITY): Payer: Medicaid Other

## 2017-08-04 DIAGNOSIS — R1013 Epigastric pain: Secondary | ICD-10-CM | POA: Diagnosis not present

## 2017-08-04 DIAGNOSIS — R110 Nausea: Secondary | ICD-10-CM | POA: Diagnosis not present

## 2017-08-04 DIAGNOSIS — F17210 Nicotine dependence, cigarettes, uncomplicated: Secondary | ICD-10-CM | POA: Insufficient documentation

## 2017-08-04 DIAGNOSIS — Z79899 Other long term (current) drug therapy: Secondary | ICD-10-CM | POA: Diagnosis not present

## 2017-08-04 DIAGNOSIS — I10 Essential (primary) hypertension: Secondary | ICD-10-CM | POA: Diagnosis not present

## 2017-08-04 LAB — COMPREHENSIVE METABOLIC PANEL
ALT: 38 U/L (ref 17–63)
AST: 37 U/L (ref 15–41)
Albumin: 4.2 g/dL (ref 3.5–5.0)
Alkaline Phosphatase: 75 U/L (ref 38–126)
Anion gap: 12 (ref 5–15)
BUN: 12 mg/dL (ref 6–20)
CHLORIDE: 98 mmol/L — AB (ref 101–111)
CO2: 25 mmol/L (ref 22–32)
CREATININE: 0.87 mg/dL (ref 0.61–1.24)
Calcium: 9.5 mg/dL (ref 8.9–10.3)
Glucose, Bld: 182 mg/dL — ABNORMAL HIGH (ref 65–99)
Potassium: 4.2 mmol/L (ref 3.5–5.1)
Sodium: 135 mmol/L (ref 135–145)
Total Bilirubin: 0.6 mg/dL (ref 0.3–1.2)
Total Protein: 7.8 g/dL (ref 6.5–8.1)

## 2017-08-04 LAB — LIPASE, BLOOD: LIPASE: 27 U/L (ref 11–51)

## 2017-08-04 LAB — URINALYSIS, ROUTINE W REFLEX MICROSCOPIC
Bacteria, UA: NONE SEEN
Bilirubin Urine: NEGATIVE
GLUCOSE, UA: 150 mg/dL — AB
Ketones, ur: 5 mg/dL — AB
Leukocytes, UA: NEGATIVE
Nitrite: NEGATIVE
PROTEIN: NEGATIVE mg/dL
Specific Gravity, Urine: 1.01 (ref 1.005–1.030)
WBC, UA: NONE SEEN WBC/hpf (ref 0–5)
pH: 8 (ref 5.0–8.0)

## 2017-08-04 LAB — BRAIN NATRIURETIC PEPTIDE: B Natriuretic Peptide: 25.6 pg/mL (ref 0.0–100.0)

## 2017-08-04 LAB — CBC
HCT: 41.3 % (ref 39.0–52.0)
Hemoglobin: 13.6 g/dL (ref 13.0–17.0)
MCH: 29.1 pg (ref 26.0–34.0)
MCHC: 32.9 g/dL (ref 30.0–36.0)
MCV: 88.2 fL (ref 78.0–100.0)
PLATELETS: 381 10*3/uL (ref 150–400)
RBC: 4.68 MIL/uL (ref 4.22–5.81)
RDW: 14.5 % (ref 11.5–15.5)
WBC: 10.4 10*3/uL (ref 4.0–10.5)

## 2017-08-04 LAB — I-STAT TROPONIN, ED: Troponin i, poc: 0 ng/mL (ref 0.00–0.08)

## 2017-08-04 MED ORDER — ONDANSETRON HCL 4 MG/2ML IJ SOLN
4.0000 mg | Freq: Once | INTRAMUSCULAR | Status: AC
  Administered 2017-08-04: 4 mg via INTRAVENOUS
  Filled 2017-08-04: qty 2

## 2017-08-04 MED ORDER — GI COCKTAIL ~~LOC~~
30.0000 mL | Freq: Once | ORAL | Status: AC
  Administered 2017-08-04: 30 mL via ORAL
  Filled 2017-08-04: qty 30

## 2017-08-04 MED ORDER — ONDANSETRON HCL 4 MG PO TABS: 4.0000 mg | ORAL_TABLET | Freq: Three times a day (TID) | ORAL | 0 refills | Status: DC | PRN

## 2017-08-04 NOTE — Discharge Instructions (Signed)
Use Zofran as needed for nausea or vomiting. Use Tylenol as needed for pain. Try to stay well-hydrated with water. Eat small, more frequent meals throughout the day.  Avoid spicy, fatty, acidic foods. Is very important that you follow-up with your primary care doctor next week for further evaluation of your abdominal pain and leg swelling. Return to the emergency room if you develop fevers, vomiting, worsening pain, or any new or concerning symptoms.

## 2017-08-04 NOTE — ED Notes (Signed)
PLZ CALL VICKIE (exwife of patient; advocating)  @ [phone].  If you could call Vickie, she can explain to patient what is going on for he does not understand.

## 2017-08-04 NOTE — ED Triage Notes (Signed)
Patient arrived by [facility] for epigastric pain that started last night after eating fish. Nausea with same. No vomiting, no diarrhea. States that he thinks the fish was undercooked

## 2017-08-04 NOTE — ED Notes (Signed)
Tolerating PO fluids °

## 2017-08-04 NOTE — ED Provider Notes (Signed)
[place] EMERGENCY DEPARTMENT Provider Note   CSN: [id number] Arrival date & time: 08/04/17  1517     History   Chief Complaint No chief complaint on file.   HPI Jeremiah [name] is a 52 y.o. male presenting for evaluation of nausea and epigastric pain.  Patient states that last night he was eating fish when he had acute onset epigastric pain and nausea.  He has had persistent nausea since, with intermittent epigastric pain which is moving down his abdomen.  He denies associated vomiting or diarrhea.  He denies fevers, chills, chest pain, shortness of breath, cough, urinary symptoms.  He has a history of chronic constipation, and states he last had a bowel movement 4 days ago, which is normal for him.  He has been evaluated by gastroenterology in the past for constipation.  He denies history of abdominal surgeries.  Additionally, patient reports gradual bilateral leg swelling.  He used to be on Lasix or other fluid pills, but is not currently on this.   HPI  Past Medical History:  Diagnosis Date  . Anxiety   . Benign essential HTN 08/27/2014  . Borderline personality disorder ([place])   . BP (high blood pressure) 01/16/2015  . Difficulty urinating   . Diverticulosis   . Dysuria   . External hemorrhoid   . Gastroparesis   . Hypertension   . Hypokalemia   . Major depression   . Microscopic hematuria   . Nicotine addiction   . Over weight   . Rectal fistula   . Schizophrenia ([place])   . Stroke [facility])     Patient Active Problem List   Diagnosis Date Noted  . Vitamin B12 deficiency 04/30/2015  . 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 Cervical pain 01/18/2015  . 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 GAD (generalized anxiety disorder) 10/19/2014  . Benign essential HTN 08/27/2014  . 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 BACK SURGERY    . COLONOSCOPY    . COLONOSCOPY WITH PROPOFOL N/A 10/12/2014   Procedure: COLONOSCOPY WITH PROPOFOL;  Surgeon: Hulen Luster, MD;  Location: [facility] ENDOSCOPY;  Service: Gastroenterology;  Laterality: N/A;  . LUMBAR SPINE SURGERY    . TONSILLECTOMY         Home Medications    Prior to Admission medications   Medication Sig Start Date End Date Taking? Authorizing Provider  amLODipine (NORVASC) 5 MG tablet Take 1 tablet (5 mg total) by mouth daily. 10/22/14   Withrow, Elyse Jarvis, FNP  bisacodyl (DULCOLAX) 5 MG EC tablet Take 5 mg by mouth daily as needed for mild constipation or moderate constipation (constipation).     [provider]  cyanocobalamin (,VITAMIN B-12,) 1000 MCG/ML injection Inject 1 mL (1,000 mcg total) into the muscle daily. 05/05/15   Pucilowska, Herma Ard B, MD  fluvoxaMINE (LUVOX) 100 MG tablet Take 1 tablet (100 mg total) by mouth at bedtime. 05/05/15   Pucilowska, Herma Ard B, MD  hydrOXYzine (ATARAX/VISTARIL) 50 MG tablet Take 2 tablets (100 mg total) by mouth at bedtime as needed (sleep). 10/22/14   Withrow, Elyse Jarvis, FNP  magnesium hydroxide (MILK OF MAGNESIA) 400 MG/5ML suspension Take 30 mLs by mouth daily as needed for mild constipation.    [provider]  metoprolol succinate (TOPROL-XL) 50 MG 24 hr tablet Take 1 tablet (50 mg total) by mouth  daily. 05/05/15   Pucilowska, Jolanta B, MD  ondansetron (ZOFRAN) 4 MG tablet Take 1 tablet (4 mg total) by mouth every 8 (eight) hours as needed for nausea or vomiting. 08/04/17   Doran Nestle, PA-C  polyethylene glycol (MIRALAX) packet Take 17 g by mouth daily. 02/26/15   Collier Flowers, MD  QUEtiapine (SEROQUEL) 400 MG tablet Take 1 tablet (400 mg total) by mouth at bedtime. 05/05/15   Pucilowska, Jolanta B, MD  temazepam (RESTORIL) 15 MG capsule Take 1 capsule (15 mg total) by mouth at bedtime. 05/05/15   Pucilowska, Wardell Honour, MD  venlafaxine XR (EFFEXOR-XR) 150 MG 24 hr  capsule Take 1 capsule (150 mg total) by mouth daily with breakfast. 05/05/15   Pucilowska, Wardell Honour, MD    Family History Family History  Problem Relation Age of Onset  . Stroke Mother   . Stroke Maternal Grandmother   . Diabetes Mellitus II Sister     Social History Social History   Tobacco Use  . Smoking status: Current Every Day Smoker    Types: Cigarettes    Last attempt to quit: 08/19/2014    Years since quitting: 2.9  . Smokeless tobacco: Never Used  Substance Use Topics  . Alcohol use: No    Alcohol/week: 0.0 oz    Comment: last drink was in march 2016  . Drug use: No     Allergies   Diflucan [fluconazole]; Ciprofloxacin; Milk-related compounds; Pepto-bismol [bismuth subsalicylate]; Trazodone and nefazodone; and Sulfur   Review of Systems Review of Systems  Gastrointestinal: Positive for abdominal pain and nausea.  All other systems reviewed and are negative.    Physical Exam Updated Vital Signs BP (!) 152/99   Pulse 93   Temp (!) 97.5 F (36.4 C) (Oral)   Resp (!) 25   SpO2 92%   Physical Exam  Constitutional: He is oriented to person, place, and time. He appears well-developed and well-nourished. No distress.  HENT:  Head: Normocephalic and atraumatic.  Eyes: Conjunctivae and EOM are normal. Pupils are equal, round, and reactive to light.  Neck: Normal range of motion. Neck supple.  Cardiovascular: Normal rate, regular rhythm and intact distal pulses.  Pulmonary/Chest: Effort normal and breath sounds normal. No respiratory distress. He has no wheezes.  Abdominal: Soft. Bowel sounds are normal. He exhibits no distension and no mass. There is no tenderness. There is no guarding.  Reported generalized abdominal tenderness without change in visual expression.  Abdomen is not rigid, distended, and no guarding.  Musculoskeletal: Normal range of motion.  Neurological: He is alert and oriented to person, place, and time.  Skin: Skin is warm and dry.    Psychiatric: He has a normal mood and affect.  Nursing note and vitals reviewed.    ED Treatments / Results  Labs (all labs ordered are listed, but only abnormal results are displayed) Labs Reviewed  COMPREHENSIVE METABOLIC PANEL - Abnormal; Notable for the following components:      Result Value   Chloride 98 (*)    Glucose, Bld 182 (*)    All other components within normal limits  URINALYSIS, ROUTINE W REFLEX MICROSCOPIC - Abnormal; Notable for the following components:   Color, Urine COLORLESS (*)    Glucose, UA 150 (*)    Hgb urine dipstick SMALL (*)    Ketones, ur 5 (*)    Squamous Epithelial / LPF 0-5 (*)    All other components within normal limits  LIPASE, BLOOD  CBC  BRAIN NATRIURETIC  PEPTIDE  I-STAT TROPONIN, ED    EKG  EKG Interpretation None       Radiology Dg Chest 2 View  Result Date: 08/04/2017 CLINICAL DATA:  Epigastric pain. EXAM: CHEST - 2 VIEW COMPARISON:  Chest x-ray dated Oct 15, 2014. FINDINGS: The heart size and mediastinal contours are within normal limits. Both lungs are clear. The visualized skeletal structures are unremarkable. IMPRESSION: No active cardiopulmonary disease. Electronically Signed   By: Titus Dubin M.D.   On: 08/04/2017 12:21    Procedures Procedures (including critical care time)  Medications Ordered in ED Medications  ondansetron (ZOFRAN) injection 4 mg (4 mg Intravenous Given 08/04/17 1326)  gi cocktail (Maalox,Lidocaine,Donnatal) (30 mLs Oral Given 08/04/17 1326)     Initial Impression / Assessment and Plan / ED Course  I have reviewed the triage vital signs and the nursing notes.  Pertinent labs & imaging results that were available during my care of the patient were reviewed by me and considered in my medical decision making (see chart for details).     Patient presenting for evaluation of epigastric pain and nausea.  Physical exam shows patient is afebrile not tachycardic.  Appears nontoxic.  Initial labs  reassuring, no leukocytosis, electrolytes stable, kidney function stable.  UA negative for infection.  At this time, doubt intra-abdominal infection requiring surgery, perforation, obstruction.  I do not believe CT scan will be beneficial at this time.  As patient reports bilateral leg swelling, will obtain CXR, troponin, EKG, and BNP.  Case discussed with attending, Dr. Thomasene Lot evaluated the patient.  EKG reassuring, no sinus STEMI.  Chest x-ray reviewed and evaluated by me, no sign of pneumonia.  BNP and troponin negative.  Discussed findings with patient.  Discussed that this is likely gastritis.  Patient symptoms improved with Zofran and GI cocktail.  Discussed antibiotic treatment over the next several days.  Discussed importance of follow-up with primary care for further evaluation of his leg swelling and abdominal symptoms.  At this time, patient appears safe for discharge.  Return precautions given.  Patient states he understands and agrees to plan.   Final Clinical Impressions(s) / ED Diagnoses   Final diagnoses:  Nausea  Epigastric abdominal pain    ED Discharge Orders        Ordered    ondansetron (ZOFRAN) 4 MG tablet  Every 8 hours PRN     08/04/17 1501       Katlin Bortner, PA-C 08/04/17 1650    Mackuen, Fredia Sorrow, MD 08/05/17 1154

## 2017-08-04 NOTE — ED Notes (Signed)
Patient verbalizes understanding of discharge instructions. Opportunity for questioning and answers were provided. Armband removed by staff, pt discharged from ED ambulatory.   

## 2019-09-04 ENCOUNTER — Other Ambulatory Visit: Payer: Self-pay | Admitting: Neurology

## 2019-09-04 DIAGNOSIS — D334 Benign neoplasm of spinal cord: Secondary | ICD-10-CM

## 2019-09-14 ENCOUNTER — Ambulatory Visit: Payer: Medicare Other

## 2020-03-16 ENCOUNTER — Ambulatory Visit (INDEPENDENT_AMBULATORY_CARE_PROVIDER_SITE_OTHER): Payer: Medicare Other | Admitting: Vascular Surgery

## 2020-03-16 ENCOUNTER — Encounter (INDEPENDENT_AMBULATORY_CARE_PROVIDER_SITE_OTHER): Payer: Self-pay | Admitting: Vascular Surgery

## 2020-03-16 ENCOUNTER — Other Ambulatory Visit: Payer: Self-pay

## 2020-03-16 VITALS — BP 121/71 | HR 87 | Resp 16 | Ht 67.0 in | Wt 250.8 lb

## 2020-03-16 DIAGNOSIS — R600 Localized edema: Secondary | ICD-10-CM | POA: Diagnosis not present

## 2020-03-16 DIAGNOSIS — I10 Essential (primary) hypertension: Secondary | ICD-10-CM | POA: Diagnosis not present

## 2020-03-16 DIAGNOSIS — E876 Hypokalemia: Secondary | ICD-10-CM | POA: Diagnosis not present

## 2020-03-16 DIAGNOSIS — M7989 Other specified soft tissue disorders: Secondary | ICD-10-CM | POA: Insufficient documentation

## 2020-03-16 NOTE — Assessment & Plan Note (Signed)
Electrolyte imbalances can worsen lower extremity swelling particularly associated with kidney disease.

## 2020-03-16 NOTE — Progress Notes (Signed)
Patient ID: Jeremiah [name], male   DOB: [birth year], 54 y.o.   MRN: [id number]  Chief Complaint  Patient presents with  . New Patient (Initial Visit)    ref Tejan-Sie venous insufficiency    HPI Jeremiah [name] is a 54 y.o. male.  I am asked to see the patient by Dr. Elijio Miles for evaluation of pitting edema both lower extremities with darkening skin discoloration. This has been a progressive problem over months to years. The patient still tries to walk frequently, but he says the more he is up on his feet the more the leg swell. No previous history of DVT or superficial thrombophlebitis. No chest pain or shortness of breath. Both lower extremities are affected. He has had episodes where the skin has broken down and had weeping. He gets scabs that are very slow to heal. No current wounds or infection to the lower extremities. Tries to elevate his legs and he does have compression stockings that he wears regularly, but the swelling continues to progress   Past Medical History:  Diagnosis Date  . Anxiety   . Benign essential HTN 08/27/2014  . Borderline personality disorder ([place])   . BP (high blood pressure) 01/16/2015  . Difficulty urinating   . Diverticulosis   . Dysuria   . External hemorrhoid   . Gastroparesis   . Hypertension   . Hypokalemia   . Major depression   . Microscopic hematuria   . Nicotine addiction   . Over weight   . Rectal fistula   . Schizophrenia ([place])   . Stroke [facility])     Past Surgical History:  Procedure Laterality Date  . BACK SURGERY    . COLONOSCOPY    . COLONOSCOPY WITH PROPOFOL N/A 10/12/2014   Procedure: COLONOSCOPY WITH PROPOFOL;  Surgeon: Hulen Luster, MD;  Location: [facility] ENDOSCOPY;  Service: Gastroenterology;  Laterality: N/A;  . LUMBAR SPINE SURGERY    . TONSILLECTOMY       Family History  Problem Relation Age of Onset  . Stroke Mother   . Stroke Maternal Grandmother   . Diabetes Mellitus II Sister   No bleeding or clotting  disorders    Social History   Tobacco Use  . Smoking status: Former Smoker    Types: Cigarettes    Quit date: 08/19/2014    Years since quitting: 5.5  . Smokeless tobacco: Never Used  Substance Use Topics  . Alcohol use: No    Alcohol/week: 0.0 standard drinks    Comment: last drink was in march 2016  . Drug use: No     Allergies  Allergen Reactions  . Diflucan [Fluconazole] Diarrhea  . Ciprofloxacin Other (See Comments)    GI distress  . Milk-Related Compounds Other (See Comments)    GI problems  . Other     Other reaction(s): Other (See Comments) GI problems  . Pepto-Bismol [Bismuth Subsalicylate] Other (See Comments)    "Bad reaction"  . Trazodone And Nefazodone Other (See Comments)    Burn tongue and causes blisters  . Sulfur Other (See Comments)    GI distress     Current Outpatient Medications  Medication Sig Dispense Refill  . amLODipine (NORVASC) 10 MG tablet Take 10 mg by mouth daily.    Marland Kitchen amLODipine (NORVASC) 5 MG tablet Take 1 tablet (5 mg total) by mouth daily. (Patient taking differently: Take 10 mg by mouth daily. ) 30 tablet 0  . Arginine (L-ARGININE DOUBLE STRENGTH) 1000 MG TABS Take  5 tablets by mouth.    Marland Kitchen atorvastatin (LIPITOR) 40 MG tablet Take 40 mg by mouth at bedtime.    . bisacodyl (DULCOLAX) 5 MG EC tablet Take 5 mg by mouth daily as needed for mild constipation or moderate constipation (constipation).     Marland Kitchen buPROPion (WELLBUTRIN SR) 150 MG 12 hr tablet     . butalbital-acetaminophen-caffeine (FIORICET) 50-325-40 MG tablet Take by mouth.    . carvedilol (COREG) 25 MG tablet Take 50 mg by mouth 2 (two) times daily with a meal.     . chlorthalidone (HYGROTON) 25 MG tablet Take 25 mg by mouth daily.    . Citrulline (PURE L-CITRULLINE) 600 MG CAPS Take 5 capsules by mouth.    . cyanocobalamin (,VITAMIN B-12,) 1000 MCG/ML injection Inject 1 mL (1,000 mcg total) into the muscle daily. 1 mL 0  . ergocalciferol (VITAMIN D2) 1.25 MG (50000 UT) capsule  Take by mouth.    . galantamine (RAZADYNE ER) 24 MG 24 hr capsule Take 24 mg by mouth daily with breakfast.    . melatonin 3 MG TABS tablet Take by mouth.    . Omega 3-6-9 Fatty Acids (OMEGA-3-6-9 PO) Take by mouth.    . polyethylene glycol (MIRALAX) packet Take 17 g by mouth daily. 14 each 0  . QUEtiapine (SEROQUEL) 400 MG tablet Take 1 tablet (400 mg total) by mouth at bedtime. 30 tablet 0  . tamsulosin (FLOMAX) 0.4 MG CAPS capsule Take 0.4 mg by mouth daily.    . Testosterone 20.25 MG/ACT (1.62%) GEL SMARTSIG:40.5 Milligram(s) Topical Every Morning    . fluvoxaMINE (LUVOX) 100 MG tablet Take 1 tablet (100 mg total) by mouth at bedtime. (Patient not taking: Reported on 03/16/2020) 30 tablet 0  . hydrOXYzine (ATARAX/VISTARIL) 50 MG tablet Take 2 tablets (100 mg total) by mouth at bedtime as needed (sleep). (Patient not taking: Reported on 03/16/2020) 60 tablet 0  . magnesium hydroxide (MILK OF MAGNESIA) 400 MG/5ML suspension Take 30 mLs by mouth daily as needed for mild constipation. (Patient not taking: Reported on 03/16/2020)    . metoprolol succinate (TOPROL-XL) 50 MG 24 hr tablet Take 1 tablet (50 mg total) by mouth daily. (Patient not taking: Reported on 03/16/2020) 30 tablet 0  . ondansetron (ZOFRAN) 4 MG tablet Take 1 tablet (4 mg total) by mouth every 8 (eight) hours as needed for nausea or vomiting. (Patient not taking: Reported on 03/16/2020) 12 tablet 0  . temazepam (RESTORIL) 15 MG capsule Take 1 capsule (15 mg total) by mouth at bedtime. (Patient not taking: Reported on 03/16/2020) 30 capsule 0  . venlafaxine XR (EFFEXOR-XR) 150 MG 24 hr capsule Take 1 capsule (150 mg total) by mouth daily with breakfast. (Patient not taking: Reported on 03/16/2020) 30 capsule 0   No current facility-administered medications for this visit.      REVIEW OF SYSTEMS (Negative unless checked)  Constitutional: [] Weight loss  [] Fever  [] Chills Cardiac: [] Chest pain   [] Chest pressure   [] Palpitations    [] Shortness of breath when laying flat   [] Shortness of breath at rest   [] Shortness of breath with exertion. Vascular:  [] Pain in legs with walking   [] Pain in legs at rest   [] Pain in legs when laying flat   [] Claudication   [] Pain in feet when walking  [] Pain in feet at rest  [] Pain in feet when laying flat   [] History of DVT   [] Phlebitis   [x] Swelling in legs   [x] Varicose veins   [] Non-healing  ulcers Pulmonary:   [] Uses home oxygen   [] Productive cough   [] Hemoptysis   [] Wheeze  [] COPD   [] Asthma Neurologic:  [] Dizziness  [] Blackouts   [] Seizures   [x] History of stroke   [] History of TIA  [] Aphasia   [] Temporary blindness   [] Dysphagia   [] Weakness or numbness in arms   [] Weakness or numbness in legs Musculoskeletal:  [x] Arthritis   [] Joint swelling   [] Joint pain   [] Low back pain Hematologic:  [] Easy bruising  [] Easy bleeding   [] Hypercoagulable state   [] Anemic  [] Hepatitis Gastrointestinal:  [] Blood in stool   [] Vomiting blood  [] Gastroesophageal reflux/heartburn   [] Abdominal pain Genitourinary:  [] Chronic kidney disease   [] Difficult urination  [] Frequent urination  [] Burning with urination   [] Hematuria Skin:  [] Rashes   [] Ulcers   [] Wounds Psychological:  [x] History of anxiety   [x]  History of major depression.    Physical Exam BP 121/71 (BP Location: Right Arm)   Pulse 87   Resp 16   Ht 5\' 7"  (1.702 m)   Wt 250 lb 12.8 oz (113.8 kg)   BMI 39.28 kg/m  Gen:  WD/WN, NAD Head: [place]/AT, No temporalis wasting.  Ear/Nose/Throat: Hearing grossly intact, nares w/o erythema or drainage, oropharynx w/o Erythema/Exudate Eyes: Conjunctiva clear, sclera non-icteric  Neck: trachea midline.  No JVD.  Pulmonary:  Good air movement, respirations not labored, no use of accessory muscles  Cardiac: RRR, no JVD Vascular:  Vessel Right Left  Radial Palpable Palpable                          DP  1+  1+  PT  not palpable  not palpable   Gastrointestinal:. No masses, surgical incisions,  or scars. Musculoskeletal: M/S 5/5 throughout.  Extremities without ischemic changes.  No deformity or atrophy. Moderate stasis dermatitis changes present to both lower extremities. 2+ bilateral lower extremity edema. Neurologic: Sensation grossly intact in extremities.  Symmetrical.  Speech is fluent. Motor exam as listed above. Psychiatric: Judgment intact, Mood & affect appropriate for pt's clinical situation. Dermatologic: No rashes or ulcers noted.  No cellulitis or open wounds.    Radiology No results found.  Labs No results found for this or any previous visit (from the past 2160 hour(s)).  Assessment/Plan:  Swelling of limb I have had a long discussion with the patient regarding swelling and why it  causes symptoms.  Patient will begin wearing graduated compression stockings class 1 (20-30 mmHg) on a daily basis. He has these already and has been using them. The patient will  beginning wearing the stockings first thing in the morning and removing them in the evening. The patient is instructed specifically not to sleep in the stockings.   In addition, behavioral modification will be initiated.  This will include frequent elevation, use of over the counter pain medications and exercise such as walking.  I have reviewed systemic causes for chronic edema such as liver, kidney and cardiac etiologies.  The patient denies problems with these organ systems.    Consideration for a lymph pump will also be made based upon the effectiveness of conservative therapy.  This would help to improve the edema control and prevent sequela such as ulcers and infections   Patient should undergo duplex ultrasound of the venous system to ensure that DVT or reflux is not present.  The patient will follow-up with me after the ultrasound.    Benign essential HTN blood pressure control important in  reducing the progression of atherosclerotic disease. On appropriate oral  medications.   Hypokalemia Electrolyte imbalances can worsen lower extremity swelling particularly associated with kidney disease.      Leotis Pain 03/16/2020, 4:57 PM   This note was created with Dragon medical transcription system.  Any errors from dictation are unintentional.

## 2020-03-16 NOTE — Assessment & Plan Note (Signed)
I have had a long discussion with the patient regarding swelling and why it  causes symptoms.  Patient will begin wearing graduated compression stockings class 1 (20-30 mmHg) on a daily basis. He has these already and has been using them. The patient will  beginning wearing the stockings first thing in the morning and removing them in the evening. The patient is instructed specifically not to sleep in the stockings.   In addition, behavioral modification will be initiated.  This will include frequent elevation, use of over the counter pain medications and exercise such as walking.  I have reviewed systemic causes for chronic edema such as liver, kidney and cardiac etiologies.  The patient denies problems with these organ systems.    Consideration for a lymph pump will also be made based upon the effectiveness of conservative therapy.  This would help to improve the edema control and prevent sequela such as ulcers and infections   Patient should undergo duplex ultrasound of the venous system to ensure that DVT or reflux is not present.  The patient will follow-up with me after the ultrasound.

## 2020-03-16 NOTE — Patient Instructions (Signed)

## 2020-03-16 NOTE — Assessment & Plan Note (Signed)
blood pressure control important in reducing the progression of atherosclerotic disease. On appropriate oral medications.  

## 2020-03-26 ENCOUNTER — Encounter (INDEPENDENT_AMBULATORY_CARE_PROVIDER_SITE_OTHER): Payer: Medicare Other

## 2020-03-26 ENCOUNTER — Ambulatory Visit (INDEPENDENT_AMBULATORY_CARE_PROVIDER_SITE_OTHER): Payer: Medicare Other | Admitting: Nurse Practitioner

## 2020-04-07 ENCOUNTER — Ambulatory Visit (INDEPENDENT_AMBULATORY_CARE_PROVIDER_SITE_OTHER): Payer: Medicare Other | Admitting: Nurse Practitioner

## 2020-04-07 ENCOUNTER — Encounter (INDEPENDENT_AMBULATORY_CARE_PROVIDER_SITE_OTHER): Payer: Medicare Other

## 2020-04-29 ENCOUNTER — Encounter (INDEPENDENT_AMBULATORY_CARE_PROVIDER_SITE_OTHER): Payer: Medicare Other

## 2020-04-29 ENCOUNTER — Emergency Department: Payer: Medicare Other

## 2020-04-29 ENCOUNTER — Ambulatory Visit (INDEPENDENT_AMBULATORY_CARE_PROVIDER_SITE_OTHER): Payer: Medicare Other | Admitting: Nurse Practitioner

## 2020-04-29 ENCOUNTER — Other Ambulatory Visit: Payer: Self-pay

## 2020-04-29 DIAGNOSIS — I10 Essential (primary) hypertension: Secondary | ICD-10-CM | POA: Insufficient documentation

## 2020-04-29 DIAGNOSIS — E876 Hypokalemia: Secondary | ICD-10-CM | POA: Insufficient documentation

## 2020-04-29 DIAGNOSIS — Z87891 Personal history of nicotine dependence: Secondary | ICD-10-CM | POA: Diagnosis not present

## 2020-04-29 DIAGNOSIS — Z8673 Personal history of transient ischemic attack (TIA), and cerebral infarction without residual deficits: Secondary | ICD-10-CM | POA: Diagnosis not present

## 2020-04-29 DIAGNOSIS — M7989 Other specified soft tissue disorders: Secondary | ICD-10-CM | POA: Diagnosis present

## 2020-04-29 DIAGNOSIS — R600 Localized edema: Secondary | ICD-10-CM | POA: Insufficient documentation

## 2020-04-29 DIAGNOSIS — Z79899 Other long term (current) drug therapy: Secondary | ICD-10-CM | POA: Diagnosis not present

## 2020-04-29 LAB — BASIC METABOLIC PANEL
Anion gap: 13 (ref 5–15)
BUN: 9 mg/dL (ref 6–20)
CO2: 26 mmol/L (ref 22–32)
Calcium: 9.1 mg/dL (ref 8.9–10.3)
Chloride: 98 mmol/L (ref 98–111)
Creatinine, Ser: 1.05 mg/dL (ref 0.61–1.24)
GFR, Estimated: >60 mL/min (ref >60)
Glucose, Bld: 164 mg/dL — ABNORMAL HIGH (ref 70–99)
Potassium: 2.9 mmol/L — ABNORMAL LOW (ref 3.5–5.1)
Sodium: 137 mmol/L (ref 135–145)

## 2020-04-29 LAB — CBC
HCT: 38.8 % — ABNORMAL LOW (ref 39.0–52.0)
Hemoglobin: 13.1 g/dL (ref 13.0–17.0)
MCH: 27.8 pg (ref 26.0–34.0)
MCHC: 33.8 g/dL (ref 30.0–36.0)
MCV: 82.2 fL (ref 80.0–100.0)
Platelets: 426 10*3/uL — ABNORMAL HIGH (ref 150–400)
RBC: 4.72 MIL/uL (ref 4.22–5.81)
RDW: 13.2 % (ref 11.5–15.5)
WBC: 7.6 10*3/uL (ref 4.0–10.5)
nRBC: 0 % (ref 0.0–0.2)

## 2020-04-29 NOTE — ED Triage Notes (Signed)
Pt arrives POV from home w cc of possible blood clot. Pt reports mass in R heel that is painful to walk on 10/10. Pt has pitting edema bilaterally. Denies shob, hx CHF COPD. Pt states he does take baby aspirin twice daily but not diagnosed w blood clots.

## 2020-04-30 ENCOUNTER — Emergency Department
Admission: EM | Admit: 2020-04-30 | Discharge: 2020-04-30 | Disposition: A | Discharge status: Home / Self Care | Payer: Medicare Other | Attending: Emergency Medicine | Admitting: Emergency Medicine

## 2020-04-30 DIAGNOSIS — R600 Localized edema: Secondary | ICD-10-CM | POA: Diagnosis not present

## 2020-04-30 DIAGNOSIS — E876 Hypokalemia: Secondary | ICD-10-CM

## 2020-04-30 DIAGNOSIS — R609 Edema, unspecified: Secondary | ICD-10-CM

## 2020-04-30 MED ORDER — POTASSIUM CHLORIDE CRYS ER 20 MEQ PO TBCR
40.0000 meq | EXTENDED_RELEASE_TABLET | Freq: Once | ORAL | Status: AC
  Administered 2020-04-30: 40 meq via ORAL
  Filled 2020-04-30: qty 2

## 2020-04-30 NOTE — Discharge Instructions (Addendum)
Continue to wrap legs to reduce swelling.  Elevate legs whenever possible, including while you are sleeping.  Return to the ER for worsening symptoms, persistent vomiting, difficulty breathing or other concerns.

## 2020-04-30 NOTE — ED Notes (Signed)
Pt legs wrapped with ace bandage wraps per MD request.

## 2020-04-30 NOTE — ED Provider Notes (Signed)
[facility] Emergency Department Provider Note   ____________________________________________   Event Date/Time   First MD Initiated Contact with Patient 04/30/20 [phone]     (approximate)  I have reviewed the triage vital signs and the nursing notes.   HISTORY  Chief Complaint Leg Swelling    HPI Jeremiah [name] is a 54 y.o. male who presents to the ED from home with a chief complaint of possible blood clot.  Patient reports he had swelling in his posterior right heel yesterday morning that was painful to walk on.  Has swelling to both legs.  History of hypertension, hypokalemia, psychiatric disease.  Saw Dr. Lucky Cowboy from vascular surgery on October for same, started compression stockings, was supposed to have a ultrasound and follow-up but patient has not been able to due to transportation issues.  Denies recent travel, trauma or hormone use.     Past Medical History:  Diagnosis Date  . Anxiety   . Benign essential HTN 08/27/2014  . Borderline personality disorder ([place])   . BP (high blood pressure) 01/16/2015  . Difficulty urinating   . Diverticulosis   . Dysuria   . External hemorrhoid   . Gastroparesis   . Hypertension   . Hypokalemia   . Major depression   . Microscopic hematuria   . Nicotine addiction   . Over weight   . Rectal fistula   . Schizophrenia ([place])   . Stroke [facility])     Patient Active Problem List   Diagnosis Date Noted  . Swelling of limb 03/16/2020  . Constipation, chronic 11/15/2016  . History of hypogonadism 11/15/2016  . Vitamin B12 deficiency 04/30/2015  . 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 Cervical pain 01/18/2015  . 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 GAD (generalized anxiety disorder) 10/19/2014  . Benign essential HTN 08/27/2014  . 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 Hypokalemia 12/05/2013     Past Surgical History:  Procedure Laterality Date  . BACK SURGERY    . COLONOSCOPY    . COLONOSCOPY WITH PROPOFOL N/A 10/12/2014   Procedure: COLONOSCOPY WITH PROPOFOL;  Surgeon: Hulen Luster, MD;  Location: [facility] ENDOSCOPY;  Service: Gastroenterology;  Laterality: N/A;  . LUMBAR SPINE SURGERY    . TONSILLECTOMY      Prior to Admission medications   Medication Sig Start Date End Date Taking? Authorizing Provider  amLODipine (NORVASC) 10 MG tablet Take 10 mg by mouth daily. 03/09/20   [provider]  amLODipine (NORVASC) 5 MG tablet Take 1 tablet (5 mg total) by mouth daily. Patient taking differently: Take 10 mg by mouth daily.  10/22/14   Withrow, Elyse Jarvis, FNP  Arginine (L-ARGININE DOUBLE STRENGTH) 1000 MG TABS Take 5 tablets by mouth.    [provider]  atorvastatin (LIPITOR) 40 MG tablet Take 40 mg by mouth at bedtime. 01/08/20   [provider]  bisacodyl (DULCOLAX) 5 MG EC tablet Take 5 mg by mouth daily as needed for mild constipation or moderate constipation (constipation).     [provider]  buPROPion [facility] SR) 150 MG 12 hr tablet  03/09/20   [provider]  butalbital-acetaminophen-caffeine (FIORICET) 50-325-40 MG tablet Take by mouth. 02/29/20   [provider]  carvedilol (COREG) 25 MG tablet Take 50 mg by mouth 2 (two) times daily with a meal.  03/09/20   [provider]  chlorthalidone (HYGROTON) 25 MG tablet Take  25 mg by mouth daily. 03/09/20   [provider]  Citrulline (PURE L-CITRULLINE) 600 MG CAPS Take 5 capsules by mouth.    [provider]  cyanocobalamin (,VITAMIN B-12,) 1000 MCG/ML injection Inject 1 mL (1,000 mcg total) into the muscle daily. 05/05/15   Pucilowska, Herma Ard B, MD  ergocalciferol (VITAMIN D2) 1.25 MG (50000 UT) capsule Take by mouth. 09/08/19   [provider]  fluvoxaMINE (LUVOX) 100 MG tablet Take 1 tablet (100 mg total) by mouth at bedtime. Patient not  taking: Reported on 03/16/2020 05/05/15   Pucilowska, Wardell Honour, MD  galantamine (RAZADYNE ER) 24 MG 24 hr capsule Take 24 mg by mouth daily with breakfast.    [provider]  hydrOXYzine (ATARAX/VISTARIL) 50 MG tablet Take 2 tablets (100 mg total) by mouth at bedtime as needed (sleep). Patient not taking: Reported on 03/16/2020 10/22/14   Benjamine Mola, FNP  magnesium hydroxide (MILK OF MAGNESIA) 400 MG/5ML suspension Take 30 mLs by mouth daily as needed for mild constipation. Patient not taking: Reported on 03/16/2020    [provider]  melatonin 3 MG TABS tablet Take by mouth. 01/26/15   [provider]  metoprolol succinate (TOPROL-XL) 50 MG 24 hr tablet Take 1 tablet (50 mg total) by mouth daily. Patient not taking: Reported on 03/16/2020 05/05/15   Pucilowska, Wardell Honour, MD  Omega 3-6-9 Fatty Acids (OMEGA-3-6-9 PO) Take by mouth.    [provider]  ondansetron (ZOFRAN) 4 MG tablet Take 1 tablet (4 mg total) by mouth every 8 (eight) hours as needed for nausea or vomiting. Patient not taking: Reported on 03/16/2020 08/04/17   Caccavale, Sophia, PA-C  polyethylene glycol (MIRALAX) packet Take 17 g by mouth daily. 02/26/15   Collier Flowers, MD  QUEtiapine (SEROQUEL) 400 MG tablet Take 1 tablet (400 mg total) by mouth at bedtime. 05/05/15   Pucilowska, Herma Ard B, MD  tamsulosin (FLOMAX) 0.4 MG CAPS capsule Take 0.4 mg by mouth daily. 03/09/20   [provider]  temazepam (RESTORIL) 15 MG capsule Take 1 capsule (15 mg total) by mouth at bedtime. Patient not taking: Reported on 03/16/2020 05/05/15   Clovis Fredrickson, MD  Testosterone 20.25 MG/ACT (1.62%) GEL SMARTSIG:40.5 Milligram(s) Topical Every Morning 01/08/20   [provider]  venlafaxine XR (EFFEXOR-XR) 150 MG 24 hr capsule Take 1 capsule (150 mg total) by mouth daily with breakfast. Patient not taking: Reported on 03/16/2020 05/05/15   Pucilowska, Wardell Honour, MD     Allergies Diflucan [fluconazole], Ciprofloxacin, Milk-related compounds, Other, Pepto-bismol [bismuth subsalicylate], Trazodone and nefazodone, and Sulfur  Family History  Problem Relation Age of Onset  . Stroke Mother   . Stroke Maternal Grandmother   . Diabetes Mellitus II Sister     Social History Social History   Tobacco Use  . Smoking status: Former Smoker    Types: Cigarettes    Quit date: 08/19/2014    Years since quitting: 5.7  . Smokeless tobacco: Never Used  Substance Use Topics  . Alcohol use: No    Alcohol/week: 0.0 standard drinks    Comment: last drink was in march 2016  . Drug use: No    Review of Systems  Constitutional: No fever/chills Eyes: No visual changes. ENT: No sore throat. Cardiovascular: Denies chest pain. Respiratory: Denies shortness of breath. Gastrointestinal: No abdominal pain.  No nausea, no vomiting.  No diarrhea.  No constipation. Genitourinary: Negative for dysuria. Musculoskeletal: Positive for BLE swelling.  Negative for back pain. Skin:  Negative for rash. Neurological: Negative for headaches, focal weakness or numbness.   ____________________________________________   PHYSICAL EXAM:  VITAL SIGNS: ED Triage Vitals  Enc Vitals Group     BP 04/29/20 1922 (!) 147/77     Pulse Rate 04/29/20 1922 79     Resp 04/29/20 1922 20     Temp 04/29/20 1922 98.9 F (37.2 C)     Temp Source 04/29/20 1922 Oral     SpO2 04/29/20 1922 98 %     Weight 04/29/20 1923 238 lb (108 kg)     Height 04/29/20 1923 5\' 6"  (1.676 m)     Head Circumference --      Peak Flow --      Pain Score 04/29/20 1923 10     Pain Loc --      Pain Edu? --      Excl. in [place]? --     Constitutional: Alert and oriented. Well appearing and in no acute distress. Eyes: Conjunctivae are normal. PERRL. EOMI. Head: Atraumatic. Nose: No congestion/rhinnorhea. Mouth/Throat: Mucous membranes are moist.  Oropharynx non-erythematous. Neck: No stridor.    Cardiovascular: Normal rate, regular rhythm. Grossly normal heart sounds.  Good peripheral circulation. Respiratory: Normal respiratory effort.  No retractions. Lungs CTAB. Gastrointestinal: Soft and nontender. No distention. No abdominal bruits. No CVA tenderness. Musculoskeletal: No lower extremity tenderness.  2+ BLE pitting edema.  No swelling to right posterior heel (patient states it has gone down).  Difficult to palpate distal pulses due to edema.  Symmetrically warm limbs without evidence for ischemia.  Less than 5-second capillary refill bilaterally.  No joint effusions. Neurologic:  Normal speech and language. No gross focal neurologic deficits are appreciated. No gait instability. Skin:  Skin is warm, dry and intact. No rash noted. Psychiatric: Mood and affect are normal. Speech and behavior are normal.  ____________________________________________   LABS (all labs ordered are listed, but only abnormal results are displayed)  Labs Reviewed  CBC - Abnormal; Notable for the following components:      Result Value   HCT 38.8 (*)    Platelets 426 (*)    All other components within normal limits  BASIC METABOLIC PANEL - Abnormal; Notable for the following components:   Potassium 2.9 (*)    Glucose, Bld 164 (*)    All other components within normal limits   ____________________________________________  EKG  None ____________________________________________  RADIOLOGY I, Iverna Hammac J, personally viewed and evaluated these images (plain radiographs) as part of my medical decision making, as well as reviewing the written report by the radiologist.  ED MD interpretation: No DVT  Official radiology report(s): US Venous Img Lower Unilateral Right  Result Date: 04/29/2020 CLINICAL DATA:  Leg pain and swelling EXAM: RIGHT LOWER EXTREMITY VENOUS DOPPLER ULTRASOUND TECHNIQUE: Gray-scale sonography with compression, as well as color and duplex ultrasound, were performed to evaluate  the deep venous system(s) from the level of the common femoral vein through the popliteal and proximal calf veins. COMPARISON:  None. FINDINGS: VENOUS Normal compressibility of the common femoral, superficial femoral, and popliteal veins, as well as the visualized calf veins. Visualized portions of profunda femoral vein and great saphenous vein unremarkable. No filling defects to suggest DVT on grayscale or color Doppler imaging. Doppler waveforms show normal direction of venous flow, normal respiratory plasticity and response to augmentation. Limited views of the contralateral common femoral vein are unremarkable. OTHER None. Limitations: none IMPRESSION: Negative. Electronically Signed   By: Ebony Cargo.D.  On: 04/29/2020 20:50    ____________________________________________   PROCEDURES  Procedure(s) performed (including Critical Care):  Procedures   ____________________________________________   INITIAL IMPRESSION / ASSESSMENT AND PLAN / ED COURSE  As part of my medical decision making, I reviewed the following data within the [facility] notes reviewed and incorporated, Old chart reviewed, Radiograph reviewed and Notes from prior ED visits     54 year old male presenting with leg swelling.  Differential diagnosis includes but is not limited to peripheral edema, CHF, DVT, etc.  Patient denies history of CAD/CHF.  Ultrasound negative for DVT.  Will wrap the legs and compression dressing and patient will follow up with vascular surgery as previously instructed.  Strict return precautions given.  Patient verbalizes understanding agrees with plan of care.      ____________________________________________   FINAL CLINICAL IMPRESSION(S) / ED DIAGNOSES  Final diagnoses:  Peripheral edema  Hypokalemia     ED Discharge Orders    None      *Please note:  Jeremiah [name] was evaluated in Emergency Department on 04/30/2020 for the symptoms described in  the history of present illness. He was evaluated in the context of the global COVID-19 pandemic, which necessitated consideration that the patient might be at risk for infection with the SARS-CoV-2 virus that causes COVID-19. Institutional protocols and algorithms that pertain to the evaluation of patients at risk for COVID-19 are in a state of rapid change based on information released by regulatory bodies including the CDC and federal and state organizations. These policies and algorithms were followed during the patient's care in the ED.  Some ED evaluations and interventions may be delayed as a result of limited staffing during and the pandemic.*   Note:  This document was prepared using Dragon voice recognition software and may include unintentional dictation errors.   Paulette Blanch, MD 04/30/20 [phone]

## 2020-04-30 NOTE — ED Notes (Signed)
Pt standing at counter in lobby with no distress noted; pt updated on wait time & vs retaken; no c/o voiced at present

## 2020-04-30 NOTE — ED Notes (Signed)
Pt signed paper d/c, follow up consent form.

## 2020-05-13 ENCOUNTER — Encounter (INDEPENDENT_AMBULATORY_CARE_PROVIDER_SITE_OTHER): Payer: Self-pay

## 2020-05-13 ENCOUNTER — Encounter (INDEPENDENT_AMBULATORY_CARE_PROVIDER_SITE_OTHER): Payer: Medicare Other

## 2020-05-13 ENCOUNTER — Ambulatory Visit (INDEPENDENT_AMBULATORY_CARE_PROVIDER_SITE_OTHER): Payer: Medicare Other | Admitting: Nurse Practitioner

## 2020-05-20 ENCOUNTER — Other Ambulatory Visit: Payer: Self-pay

## 2020-05-20 ENCOUNTER — Ambulatory Visit (INDEPENDENT_AMBULATORY_CARE_PROVIDER_SITE_OTHER): Payer: Medicare Other

## 2020-05-20 ENCOUNTER — Encounter (INDEPENDENT_AMBULATORY_CARE_PROVIDER_SITE_OTHER): Payer: Self-pay | Admitting: Nurse Practitioner

## 2020-05-20 ENCOUNTER — Ambulatory Visit (INDEPENDENT_AMBULATORY_CARE_PROVIDER_SITE_OTHER): Payer: Medicare Other | Admitting: Nurse Practitioner

## 2020-05-20 VITALS — BP 138/89 | HR 66 | Ht 68.0 in | Wt 235.0 lb

## 2020-05-20 DIAGNOSIS — M7989 Other specified soft tissue disorders: Secondary | ICD-10-CM

## 2020-05-20 DIAGNOSIS — I10 Essential (primary) hypertension: Secondary | ICD-10-CM

## 2020-05-20 DIAGNOSIS — R6889 Other general symptoms and signs: Secondary | ICD-10-CM | POA: Diagnosis not present

## 2020-05-21 ENCOUNTER — Encounter (INDEPENDENT_AMBULATORY_CARE_PROVIDER_SITE_OTHER): Payer: Self-pay | Admitting: Nurse Practitioner

## 2020-05-21 NOTE — Progress Notes (Signed)
Subjective:    Patient ID: Jeremiah [name], male    DOB: 01/18/1966, 54 y.o.   MRN: [id number] Chief Complaint  Patient presents with  . Follow-up    U/S      Jeremiah [name] is a 54 y.o. male.  He returns today for noninvasive studies guarding evaluation of bilateral pitting edema.  This has been an ongoing progressive problem for years per the patient.  The patient notes that he tries to walk but the more that he stands on his feet more his legs swell.  He notes that he does wear compression stockings regularly as well as elevate his legs.  Despite these conservative therapy he continues to have edema.  He has also had episodes of weeping as well as skin breakdown.  Currently he does not have any open wounds or ulcerations.  The patient denies any known history of cardiac, nephrotic or hepatic disease.  However, the patient does note after several years he had a extremely elevated blood pressure that was uncontrolled for years.  He notes that at times he would be in the 220/120 range.  Currently denies any fever, chills, nausea, vomiting or diarrhea.  Patient is also concerned for possible peripheral arterial disease.  The patient has noted that he does not have any symptoms that are consistent with PAD such as claudication, rest pain or acutely ischemic symptoms.  However the patient notes that he has done his ABIs at home and notes that they were "significantly high" causing him concern.  Today his noninvasive study showed no evidence of DVT or superficial venous thrombosis.  Patient has evidence of deep venous insufficiency seen in the right lower extremity as well as evidence of the saphenofemoral junction bilaterally.     Review of Systems  Cardiovascular: Positive for leg swelling.  Skin: Positive for wound.       Objective:   Physical Exam HENT:     Head: Normocephalic.  Cardiovascular:     Rate and Rhythm: Normal rate.  Pulmonary:     Effort: Pulmonary effort is normal.   Musculoskeletal:     Right lower leg: 3+ Pitting Edema present.     Left lower leg: 3+ Pitting Edema present.  Skin:    General: Skin is warm and dry.  Neurological:     Mental Status: He is oriented to person, place, and time.  Psychiatric:        Mood and Affect: Mood normal.        Behavior: Behavior normal.        Thought Content: Thought content normal.        Judgment: Judgment normal.     BP 138/89   Pulse 66   Ht 5\' 8"  (1.727 m)   Wt 235 lb (106.6 kg)   BMI 35.73 kg/m   Past Medical History:  Diagnosis Date  . Anxiety   . Benign essential HTN 08/27/2014  . Borderline personality disorder ([place])   . BP (high blood pressure) 01/16/2015  . Difficulty urinating   . Diverticulosis   . Dysuria   . External hemorrhoid   . Gastroparesis   . Hypertension   . Hypokalemia   . Major depression   . Microscopic hematuria   . Nicotine addiction   . Over weight   . Rectal fistula   . Schizophrenia ([place])   . Stroke [facility])     Social History   Socioeconomic History  . Marital status: Single    Spouse name:  Not on file  . Number of children: Not on file  . Years of education: Not on file  . Highest education level: Not on file  Occupational History  . Not on file  Tobacco Use  . Smoking status: Former Smoker    Types: Cigarettes    Quit date: 08/19/2014    Years since quitting: 5.7  . Smokeless tobacco: Never Used  Substance and Sexual Activity  . Alcohol use: No    Alcohol/week: 0.0 standard drinks    Comment: last drink was in march 2016  . Drug use: No  . Sexual activity: Not on file  Other Topics Concern  . Not on file  Social History Narrative  . Not on file   Social Determinants of Health   Financial Resource Strain: Not on file  Food Insecurity: Not on file  Transportation Needs: Not on file  Physical Activity: Not on file  Stress: Not on file  Social Connections: Not on file  Intimate Partner Violence: Not on file    Past Surgical History:   Procedure Laterality Date  . BACK SURGERY    . COLONOSCOPY    . COLONOSCOPY WITH PROPOFOL N/A 10/12/2014   Procedure: COLONOSCOPY WITH PROPOFOL;  Surgeon: Wallace Cullens, MD;  Location: [facility] ENDOSCOPY;  Service: Gastroenterology;  Laterality: N/A;  . LUMBAR SPINE SURGERY    . TONSILLECTOMY      Family History  Problem Relation Age of Onset  . Stroke Mother   . Stroke Maternal Grandmother   . Diabetes Mellitus II Sister     Allergies  Allergen Reactions  . Diflucan [Fluconazole] Diarrhea  . Ciprofloxacin Other (See Comments)    GI distress  . Milk-Related Compounds Other (See Comments)    GI problems  . Other     Other reaction(s): Other (See Comments) GI problems  . Pepto-Bismol [Bismuth Subsalicylate] Other (See Comments)    "Bad reaction"  . Trazodone And Nefazodone Other (See Comments)    Burn tongue and causes blisters  . Sulfur Other (See Comments)    GI distress     CBC Latest Ref Rng & Units 04/29/2020 08/04/2017 04/29/2015  WBC 4.0 - 10.5 K/uL 7.6 10.4 6.8  Hemoglobin 13.0 - 17.0 g/dL 71.0 62.6 94.8  Hematocrit 39.0 - 52.0 % 38.8(L) 41.3 42.2  Platelets 150 - 400 K/uL 426(H) 381 321      CMP     Component Value Date/Time   NA 137 04/29/2020 1924   NA 135 09/19/2014 1556   K 2.9 (L) 04/29/2020 1924   K 3.9 09/19/2014 1556   CL 98 04/29/2020 1924   CL 97 (L) 09/19/2014 1556   CO2 26 04/29/2020 1924   CO2 27 09/19/2014 1556   GLUCOSE 164 (H) 04/29/2020 1924   GLUCOSE 119 (H) 09/19/2014 1556   BUN 9 04/29/2020 1924   BUN 5 (L) 09/19/2014 1556   CREATININE 1.05 04/29/2020 1924   CREATININE 0.98 09/19/2014 1556   CALCIUM 9.1 04/29/2020 1924   CALCIUM 10.1 09/19/2014 1556   PROT 7.8 08/04/2017 0923   PROT 7.6 09/19/2014 1556   ALBUMIN 4.2 08/04/2017 0923   ALBUMIN 4.6 09/19/2014 1556   AST 37 08/04/2017 0923   AST 24 09/19/2014 1556   ALT 38 08/04/2017 0923   ALT 21 09/19/2014 1556   ALKPHOS 75 08/04/2017 0923   ALKPHOS 50 09/19/2014 1556   BILITOT  0.6 08/04/2017 0923   BILITOT 0.8 09/19/2014 1556   GFRNONAA >60 04/29/2020 1924  GFRNONAA >60 09/19/2014 1556   GFRAA >60 08/04/2017 0923   GFRAA >60 09/19/2014 1556     No results found.     Assessment & Plan:   1. Swelling of limb The patient has significant 3+ pitting edema and also endorses having weeping and multiple ulcerations at times.  Due to his noted significant history of extreme hypertension that was untreated for some time there is the distinct possibility of possible cardiac damage.  We will place referral to the patient for possible work-up of cardiomyopathy or heart failure.  Otherwise, the patient is instructed to continue with conservative therapy for leg swelling including elevation of lower extremities when possible.  In addition to use of medical grade 1 compression stockings.  The patient was also given a prescription for increased strength compression socks per his request of 40 to 50 mmHg.  Patient also be evaluated for lymphedema pump pending the evaluation with cardiology. - Ambulatory referral to Cardiology  2. Benign essential HTN Continue antihypertensive medications as already ordered, these medications have been reviewed and there are no changes at this time.   3. Abnormal ankle brachial index (ABI) The patient has done ABIs at home and notes that they were elevated.  The normal range of ABIs discussed as well as the likelihood that these ABIs may not be correct, as an at home blood pressure cuff may not be as sensitive.  We will have patient return at his convenience for ABIs to evaluate peripheral arterial disease   Current Outpatient Medications on File Prior to Visit  Medication Sig Dispense Refill  . amLODipine (NORVASC) 10 MG tablet Take 10 mg by mouth daily.    Marland Kitchen amLODipine (NORVASC) 5 MG tablet Take 1 tablet (5 mg total) by mouth daily. (Patient taking differently: Take 10 mg by mouth daily.) 30 tablet 0  . Arginine (L-ARGININE DOUBLE  STRENGTH) 1000 MG TABS Take 5 tablets by mouth.    Marland Kitchen atorvastatin (LIPITOR) 40 MG tablet Take 40 mg by mouth at bedtime.    . bisacodyl (DULCOLAX) 5 MG EC tablet Take 5 mg by mouth daily as needed for mild constipation or moderate constipation (constipation).     Marland Kitchen buPROPion (WELLBUTRIN SR) 150 MG 12 hr tablet     . butalbital-acetaminophen-caffeine (FIORICET) 50-325-40 MG tablet Take by mouth.    . carvedilol (COREG) 25 MG tablet Take 50 mg by mouth 2 (two) times daily with a meal.     . chlorthalidone (HYGROTON) 25 MG tablet Take 25 mg by mouth daily.    . Citrulline (PURE L-CITRULLINE) 600 MG CAPS Take 5 capsules by mouth.    . cyanocobalamin (,VITAMIN B-12,) 1000 MCG/ML injection Inject 1 mL (1,000 mcg total) into the muscle daily. 1 mL 0  . ergocalciferol (VITAMIN D2) 1.25 MG (50000 UT) capsule Take by mouth.    . fluvoxaMINE (LUVOX) 100 MG tablet Take 1 tablet (100 mg total) by mouth at bedtime. 30 tablet 0  . galantamine (RAZADYNE ER) 24 MG 24 hr capsule Take 24 mg by mouth daily with breakfast.    . hydrOXYzine (ATARAX/VISTARIL) 50 MG tablet Take 2 tablets (100 mg total) by mouth at bedtime as needed (sleep). 60 tablet 0  . magnesium hydroxide (MILK OF MAGNESIA) 400 MG/5ML suspension Take 30 mLs by mouth daily as needed for mild constipation.    . melatonin 3 MG TABS tablet Take by mouth.    . metoprolol succinate (TOPROL-XL) 50 MG 24 hr tablet Take 1 tablet (50 mg  total) by mouth daily. 30 tablet 0  . Omega 3-6-9 Fatty Acids (OMEGA-3-6-9 PO) Take by mouth.    . ondansetron (ZOFRAN) 4 MG tablet Take 1 tablet (4 mg total) by mouth every 8 (eight) hours as needed for nausea or vomiting. 12 tablet 0  . polyethylene glycol (MIRALAX) packet Take 17 g by mouth daily. 14 each 0  . QUEtiapine (SEROQUEL) 400 MG tablet Take 1 tablet (400 mg total) by mouth at bedtime. 30 tablet 0  . tamsulosin (FLOMAX) 0.4 MG CAPS capsule Take 0.4 mg by mouth daily.    . temazepam (RESTORIL) 15 MG capsule Take 1  capsule (15 mg total) by mouth at bedtime. 30 capsule 0  . Testosterone 20.25 MG/ACT (1.62%) GEL SMARTSIG:40.5 Milligram(s) Topical Every Morning    . venlafaxine XR (EFFEXOR-XR) 150 MG 24 hr capsule Take 1 capsule (150 mg total) by mouth daily with breakfast. 30 capsule 0   No current facility-administered medications on file prior to visit.    There are no Patient Instructions on file for this visit. No follow-ups on file.   Kris Hartmann, NP

## 2020-06-02 ENCOUNTER — Ambulatory Visit: Payer: Medicare Other | Admitting: Internal Medicine

## 2020-06-02 NOTE — Progress Notes (Deleted)
New Outpatient Visit Date: 06/02/2020  Referring Provider: Kris Hartmann, NP [place],  [place] [id number]  Chief Complaint: ***  HPI:  Jeremiah [name] is a 55 y.o. male who is being seen today for the evaluation of leg swelling at the request of Ms. Owens Shark. He has a history of stroke, hypertension, hypokalemia, schizophrenia, depression, and anxiety.  He presented to the [facility] emergency department on 04/30/2020 complaining of persistent leg edema and concern for DVT.  He has also been followed by vascular surgery, having been seen most recently by Ms. Brown on 05/20/2020.  He was noted to have 3+ pitting edema.  Given this finding as well as history of uncontrolled hypertension, he was referred to [place] to exclude cardiomyopathy contributing to his chronic edema.  --------------------------------------------------------------------------------------------------  Cardiovascular History & Procedures: Cardiovascular Problems:  Lower extremity edema  Risk Factors:  Prior stroke, hypertension, male gender, obesity, and prior tobacco use  Cath/PCI:  None  CV Surgery:  None  EP Procedures and Devices:  None  Non-Invasive Evaluation(s):  Lower extremity venous duplex (05/20/2020): No evidence of DVT in either leg.  No evidence of superficial thrombosis.  Venous reflux noted.  Recent CV Pertinent Labs: Lab Results  Component Value Date   CHOL 166 03/29/2015   HDL 44 03/29/2015   LDLCALC 105 (H) 03/29/2015   TRIG 84 03/29/2015   CHOLHDL 3.8 03/29/2015   INR 0.9 12/01/2011   BNP 25.6 08/04/2017   K 2.9 (L) 04/29/2020   K 3.9 09/19/2014   MG 1.7 (L) 05/31/2013   BUN 9 04/29/2020   BUN 5 (L) 09/19/2014   CREATININE 1.05 04/29/2020   CREATININE 0.98 09/19/2014    --------------------------------------------------------------------------------------------------  Past Medical History:  Diagnosis Date   Anxiety    Benign essential HTN 08/27/2014   Borderline  personality disorder ([facility])    BP (high blood pressure) 01/16/2015   Difficulty urinating    Diverticulosis    Dysuria    External hemorrhoid    Gastroparesis    Hypertension    Hypokalemia    Major depression    Microscopic hematuria    Nicotine addiction    Over weight    Rectal fistula    Schizophrenia ([facility])    Stroke [facility])     Past Surgical History:  Procedure Laterality Date   BACK SURGERY     COLONOSCOPY     COLONOSCOPY WITH PROPOFOL N/A 10/12/2014   Procedure: COLONOSCOPY WITH PROPOFOL;  Surgeon: Hulen Luster, MD;  Location: [facility] ENDOSCOPY;  Service: Gastroenterology;  Laterality: N/A;   LUMBAR SPINE SURGERY     TONSILLECTOMY      No outpatient medications have been marked as taking for the 06/02/20 encounter (Appointment) with Kelise Kuch, Jeremiah Gave, MD.    Allergies: Diflucan [fluconazole], Ciprofloxacin, Milk-related compounds, Other, Pepto-bismol [bismuth subsalicylate], Trazodone and nefazodone, and Sulfur  Social History   Tobacco Use   Smoking status: Former Smoker    Types: Cigarettes    Quit date: 08/19/2014    Years since quitting: 5.7   Smokeless tobacco: Never Used  Substance Use Topics   Alcohol use: No    Alcohol/week: 0.0 standard drinks    Comment: last drink was in march 2016   Drug use: No    Family History  Problem Relation Age of Onset   Stroke Mother    Stroke Maternal Grandmother    Diabetes Mellitus II Sister     Review of Systems: A 12-system review of systems was performed and  was negative except as noted in the HPI.  --------------------------------------------------------------------------------------------------  Physical Exam: There were no vitals taken for this visit.  General:  *** HEENT: No conjunctival pallor or scleral icterus. Facemask in place. Neck: Supple without lymphadenopathy, thyromegaly, JVD, or HJR. No carotid bruit. Lungs: Normal work of breathing. Clear to auscultation bilaterally  without wheezes or crackles. Heart: Regular rate and rhythm without murmurs, rubs, or gallops. Non-displaced PMI. Abd: Bowel sounds present. Soft, NT/ND without hepatosplenomegaly Ext: No lower extremity edema. Radial, PT, and DP pulses are 2+ bilaterally Skin: Warm and dry without rash. Neuro: CNIII-XII intact. Strength and fine-touch sensation intact in upper and lower extremities bilaterally. Psych: Normal mood and affect.  EKG:  ***  Lab Results  Component Value Date   WBC 7.6 04/29/2020   HGB 13.1 04/29/2020   HCT 38.8 (L) 04/29/2020   MCV 82.2 04/29/2020   PLT 426 (H) 04/29/2020    Lab Results  Component Value Date   NA 137 04/29/2020   K 2.9 (L) 04/29/2020   CL 98 04/29/2020   CO2 26 04/29/2020   BUN 9 04/29/2020   CREATININE 1.05 04/29/2020   GLUCOSE 164 (H) 04/29/2020   ALT 38 08/04/2017    Lab Results  Component Value Date   CHOL 166 03/29/2015   HDL 44 03/29/2015   LDLCALC 105 (H) 03/29/2015   TRIG 84 03/29/2015   CHOLHDL 3.8 03/29/2015     --------------------------------------------------------------------------------------------------  ASSESSMENT AND PLAN: Jeremiah Gave Kieran Nachtigal, MD 06/02/2020 8:06 AM

## 2020-06-04 ENCOUNTER — Other Ambulatory Visit: Payer: Self-pay

## 2020-06-04 ENCOUNTER — Emergency Department
Admission: EM | Admit: 2020-06-04 | Discharge: 2020-06-04 | Disposition: A | Discharge status: Home / Self Care | Payer: Medicare Other | Attending: Emergency Medicine | Admitting: Emergency Medicine

## 2020-06-04 DIAGNOSIS — Y9289 Other specified places as the place of occurrence of the external cause: Secondary | ICD-10-CM | POA: Insufficient documentation

## 2020-06-04 DIAGNOSIS — M79651 Pain in right thigh: Secondary | ICD-10-CM | POA: Diagnosis present

## 2020-06-04 DIAGNOSIS — X509XXA Other and unspecified overexertion or strenuous movements or postures, initial encounter: Secondary | ICD-10-CM | POA: Insufficient documentation

## 2020-06-04 DIAGNOSIS — Z5321 Procedure and treatment not carried out due to patient leaving prior to being seen by health care provider: Secondary | ICD-10-CM | POA: Diagnosis not present

## 2020-06-04 DIAGNOSIS — M79652 Pain in left thigh: Secondary | ICD-10-CM | POA: Diagnosis not present

## 2020-06-04 NOTE — ED Triage Notes (Addendum)
Pt in with co bilat thigh pain states has been doing a lot of lifting and working. Pain only when he walks or gets up from sitting position. Pt has been moving.

## 2020-06-04 NOTE — ED Triage Notes (Signed)
EMS brings pt in from home for cramping in legs

## 2020-06-16 ENCOUNTER — Other Ambulatory Visit (INDEPENDENT_AMBULATORY_CARE_PROVIDER_SITE_OTHER): Payer: Self-pay | Admitting: Nurse Practitioner

## 2020-06-16 DIAGNOSIS — R6889 Other general symptoms and signs: Secondary | ICD-10-CM

## 2020-06-18 ENCOUNTER — Ambulatory Visit (INDEPENDENT_AMBULATORY_CARE_PROVIDER_SITE_OTHER): Payer: Medicare Other | Admitting: Vascular Surgery

## 2020-06-18 ENCOUNTER — Encounter (INDEPENDENT_AMBULATORY_CARE_PROVIDER_SITE_OTHER): Payer: Medicare Other

## 2020-07-27 ENCOUNTER — Ambulatory Visit (INDEPENDENT_AMBULATORY_CARE_PROVIDER_SITE_OTHER): Payer: Medicare Other | Admitting: Vascular Surgery

## 2020-07-27 ENCOUNTER — Encounter (INDEPENDENT_AMBULATORY_CARE_PROVIDER_SITE_OTHER): Payer: Self-pay | Admitting: Vascular Surgery

## 2020-07-27 ENCOUNTER — Other Ambulatory Visit: Payer: Self-pay

## 2020-07-27 ENCOUNTER — Ambulatory Visit (INDEPENDENT_AMBULATORY_CARE_PROVIDER_SITE_OTHER): Payer: Medicare Other

## 2020-07-27 VITALS — BP 142/88 | HR 69 | Resp 16 | Wt 216.0 lb

## 2020-07-27 DIAGNOSIS — M7989 Other specified soft tissue disorders: Secondary | ICD-10-CM

## 2020-07-27 DIAGNOSIS — M79605 Pain in left leg: Secondary | ICD-10-CM

## 2020-07-27 DIAGNOSIS — M79604 Pain in right leg: Secondary | ICD-10-CM | POA: Diagnosis not present

## 2020-07-27 DIAGNOSIS — R6889 Other general symptoms and signs: Secondary | ICD-10-CM | POA: Diagnosis not present

## 2020-07-27 DIAGNOSIS — E876 Hypokalemia: Secondary | ICD-10-CM

## 2020-07-27 DIAGNOSIS — I10 Essential (primary) hypertension: Secondary | ICD-10-CM

## 2020-07-27 DIAGNOSIS — M79609 Pain in unspecified limb: Secondary | ICD-10-CM | POA: Insufficient documentation

## 2020-07-27 NOTE — Assessment & Plan Note (Signed)
Actually some better with increased activity and elevation.

## 2020-07-27 NOTE — Progress Notes (Signed)
MRN : [id number]  Jeremiah [name] is a 55 y.o. (30-Apr-1966) male who presents with chief complaint of  Chief Complaint  Patient presents with   Follow-up    Ultrasound follow up  .  History of Present Illness: Patient returns today in follow up of his leg swelling and pain. He has previously undergone a venous study which was normal. He is actually been more active and elevating his legs more and his swelling has gone down some. He did a home ABI which was abnormal and with his leg pain that prompted a formal ABI done today. His ABIs are 1.36 on the right and 1.37 on the left with brisk triphasic normal waveforms and normal digital pressures and waveforms bilaterally consistent with no arterial insufficiency.  Current Outpatient Medications  Medication Sig Dispense Refill   amLODipine (NORVASC) 10 MG tablet Take 10 mg by mouth daily.     amLODipine (NORVASC) 5 MG tablet Take 1 tablet (5 mg total) by mouth daily. (Patient taking differently: Take 10 mg by mouth daily.) 30 tablet 0   Arginine (L-ARGININE DOUBLE STRENGTH) 1000 MG TABS Take 5 tablets by mouth.     atorvastatin (LIPITOR) 40 MG tablet Take 40 mg by mouth at bedtime.     bisacodyl (DULCOLAX) 5 MG EC tablet Take 5 mg by mouth daily as needed for mild constipation or moderate constipation (constipation).      buPROPion (WELLBUTRIN SR) 150 MG 12 hr tablet      butalbital-acetaminophen-caffeine (FIORICET) 50-325-40 MG tablet Take by mouth.     carvedilol (COREG) 25 MG tablet Take 50 mg by mouth 2 (two) times daily with a meal.      chlorthalidone (HYGROTON) 25 MG tablet Take 25 mg by mouth daily.     Citrulline (PURE L-CITRULLINE) 600 MG CAPS Take 5 capsules by mouth.     cyanocobalamin (,VITAMIN B-12,) 1000 MCG/ML injection Inject 1 mL (1,000 mcg total) into the muscle daily. 1 mL 0   ergocalciferol (VITAMIN D2) 1.25 MG (50000 UT) capsule Take by mouth.     fluvoxaMINE (LUVOX) 100 MG tablet Take 1 tablet (100 mg  total) by mouth at bedtime. 30 tablet 0   galantamine (RAZADYNE ER) 24 MG 24 hr capsule Take 24 mg by mouth daily with breakfast.     hydrOXYzine (ATARAX/VISTARIL) 50 MG tablet Take 2 tablets (100 mg total) by mouth at bedtime as needed (sleep). 60 tablet 0   magnesium hydroxide (MILK OF MAGNESIA) 400 MG/5ML suspension Take 30 mLs by mouth daily as needed for mild constipation.     melatonin 3 MG TABS tablet Take by mouth.     metoprolol succinate (TOPROL-XL) 50 MG 24 hr tablet Take 1 tablet (50 mg total) by mouth daily. 30 tablet 0   Omega 3-6-9 Fatty Acids (OMEGA-3-6-9 PO) Take by mouth.     ondansetron (ZOFRAN) 4 MG tablet Take 1 tablet (4 mg total) by mouth every 8 (eight) hours as needed for nausea or vomiting. 12 tablet 0   polyethylene glycol (MIRALAX) packet Take 17 g by mouth daily. 14 each 0   QUEtiapine (SEROQUEL) 400 MG tablet Take 1 tablet (400 mg total) by mouth at bedtime. 30 tablet 0   tamsulosin (FLOMAX) 0.4 MG CAPS capsule Take 0.4 mg by mouth daily.     temazepam (RESTORIL) 15 MG capsule Take 1 capsule (15 mg total) by mouth at bedtime. 30 capsule 0   Testosterone 20.25 MG/ACT (1.62%) GEL SMARTSIG:40.5 Milligram(s) Topical Every  Morning     venlafaxine XR (EFFEXOR-XR) 150 MG 24 hr capsule Take 1 capsule (150 mg total) by mouth daily with breakfast. 30 capsule 0   No current facility-administered medications for this visit.    Past Medical History:  Diagnosis Date   Anxiety    Benign essential HTN 08/27/2014   Borderline personality disorder (HCC)    BP (high blood pressure) 01/16/2015   Difficulty urinating    Diverticulosis    Dysuria    External hemorrhoid    Gastroparesis    Hypertension    Hypokalemia    Major depression    Microscopic hematuria    Nicotine addiction    Over weight    Rectal fistula    Schizophrenia ([place])    Stroke [facility])     Past Surgical History:  Procedure Laterality Date   BACK SURGERY     COLONOSCOPY      COLONOSCOPY WITH PROPOFOL N/A 10/12/2014   Procedure: COLONOSCOPY WITH PROPOFOL;  Surgeon: Hulen Luster, MD;  Location: [facility] ENDOSCOPY;  Service: Gastroenterology;  Laterality: N/A;   LUMBAR SPINE SURGERY     TONSILLECTOMY       Social History   Tobacco Use   Smoking status: Former Smoker    Types: Cigarettes    Quit date: 08/19/2014    Years since quitting: 5.9   Smokeless tobacco: Never Used  Substance Use Topics   Alcohol use: No    Alcohol/week: 0.0 standard drinks    Comment: last drink was in march 2016   Drug use: No      Family History  Problem Relation Age of Onset   Stroke Mother    Stroke Maternal Grandmother    Diabetes Mellitus II Sister     Allergies  Allergen Reactions   Diflucan [Fluconazole] Diarrhea   Ciprofloxacin Other (See Comments)    GI distress   Milk-Related Compounds Other (See Comments)    GI problems   Other     Other reaction(s): Other (See Comments) GI problems   Pepto-Bismol [Bismuth Subsalicylate] Other (See Comments)    "Bad reaction"   Trazodone And Nefazodone Other (See Comments)    Burn tongue and causes blisters   Elemental Sulfur Other (See Comments)    GI distress     REVIEW OF SYSTEMS (Negative unless checked)  Constitutional: [] ?Weight loss  [] ?Fever  [] ?Chills Cardiac: [] ?Chest pain   [] ?Chest pressure   [] ?Palpitations   [] ?Shortness of breath when laying flat   [] ?Shortness of breath at rest   [] ?Shortness of breath with exertion. Vascular:  [] ?Pain in legs with walking   [] ?Pain in legs at rest   [] ?Pain in legs when laying flat   [] ?Claudication   [] ?Pain in feet when walking  [] ?Pain in feet at rest  [] ?Pain in feet when laying flat   [] ?History of DVT   [] ?Phlebitis   [x] ?Swelling in legs   [x] ?Varicose veins   [] ?Non-healing ulcers Pulmonary:   [] ?Uses home oxygen   [] ?Productive cough   [] ?Hemoptysis   [] ?Wheeze  [] ?COPD   [] ?Asthma Neurologic:  [] ?Dizziness  [] ?Blackouts   [] ?Seizures    [x] ?History of stroke   [] ?History of TIA  [] ?Aphasia   [] ?Temporary blindness   [] ?Dysphagia   [] ?Weakness or numbness in arms   [] ?Weakness or numbness in legs Musculoskeletal:  [x] ?Arthritis   [] ?Joint swelling   [] ?Joint pain   [] ?Low back pain Hematologic:  [] ?Easy bruising  [] ?Easy bleeding   [] ?Hypercoagulable state   [] ?Anemic  [] ?Hepatitis  Gastrointestinal:  [] ?Blood in stool   [] ?Vomiting blood  [] ?Gastroesophageal reflux/heartburn   [] ?Abdominal pain Genitourinary:  [] ?Chronic kidney disease   [] ?Difficult urination  [] ?Frequent urination  [] ?Burning with urination   [] ?Hematuria Skin:  [] ?Rashes   [] ?Ulcers   [] ?Wounds Psychological:  [x] ?History of anxiety   [x] ? History of major depression.  Physical Examination  BP (!) 142/88 (BP Location: Right Arm)    Pulse 69    Resp 16    Wt 216 lb (98 kg)    BMI 34.86 kg/m  Gen:  WD/WN, NAD Head: [place]/AT, No temporalis wasting. Ear/Nose/Throat: Hearing grossly intact, nares w/o erythema or drainage Eyes: Conjunctiva clear. Sclera non-icteric Neck: Supple.  Trachea midline Pulmonary:  Good air movement, no use of accessory muscles.  Cardiac: RRR, no JVD Vascular:  Vessel Right Left  Radial Palpable Palpable                          PT Palpable Palpable  DP Palpable Palpable    Musculoskeletal: M/S 5/5 throughout.  No deformity or atrophy. 1+ bilateral lower extremity edema. Neurologic: Sensation grossly intact in extremities.  Symmetrical.  Speech is fluent.  Psychiatric: Judgment intact, Mood & affect appropriate for pt's clinical situation. Dermatologic: No rashes or ulcers noted.  No cellulitis or open wounds.       Labs Recent Results (from the past 2160 hour(s))  CBC     Status: Abnormal   Collection Time: 04/29/20  7:24 PM  Result Value Ref Range   WBC 7.6 4.0 - 10.5 K/uL   RBC 4.72 4.22 - 5.81 MIL/uL   Hemoglobin 13.1 13.0 - 17.0 g/dL   HCT 38.8 (L) 39.0 - 52.0 %   MCV 82.2 80.0 - 100.0 fL   MCH 27.8 26.0 -  34.0 pg   MCHC 33.8 30.0 - 36.0 g/dL   RDW 13.2 11.5 - 15.5 %   Platelets 426 (H) 150 - 400 K/uL   nRBC 0.0 0.0 - 0.2 %    Comment: Performed at [facility], [address] [place], [place] [address]  Basic metabolic panel     Status: Abnormal   Collection Time: 04/29/20  7:24 PM  Result Value Ref Range   Sodium 137 135 - 145 mmol/L   Potassium 2.9 (L) 3.5 - 5.1 mmol/L   Chloride 98 98 - 111 mmol/L   CO2 26 22 - 32 mmol/L   Glucose, Bld 164 (H) 70 - 99 mg/dL    Comment: Glucose reference range applies only to samples taken after fasting for at least 8 hours.   BUN 9 6 - 20 mg/dL   Creatinine, Ser 1.05 0.61 - 1.24 mg/dL   Calcium 9.1 8.9 - 10.3 mg/dL   GFR, Estimated >60 >60 mL/min    Comment: (NOTE) Calculated using the CKD-EPI Creatinine Equation (2021)    Anion gap 13 5 - 15    Comment: Performed at [facility], [address], [place],  [address]    Radiology No results found.  Assessment/Plan Benign essential HTN blood pressure control important in reducing the progression of atherosclerotic disease. On appropriate oral medications.   Hypokalemia Electrolyte imbalances can worsen lower extremity swelling particularly associated with kidney disease.  Swelling of limb Actually some better with increased activity and elevation.  Pain in limb ABIs today are 1.36 on the right and 1.37 on the left with brisk triphasic waveforms and brisk digital pressures and waveforms bilaterally consistent with  no arterial insufficiency. We have previously checked his veins in his legs. His lower extremity symptoms are not secondary to vascular disease. He can wear compression for his swelling going forward. He can follow-up with [place] on an as-needed basis.    Leotis Pain, MD  07/27/2020 3:40 PM    This note was created with Dragon medical transcription system.  Any errors from dictation are purely unintentional

## 2020-07-27 NOTE — Assessment & Plan Note (Signed)
ABIs today are 1.36 on the right and 1.37 on the left with brisk triphasic waveforms and brisk digital pressures and waveforms bilaterally consistent with no arterial insufficiency. We have previously checked his veins in his legs. His lower extremity symptoms are not secondary to vascular disease. He can wear compression for his swelling going forward. He can follow-up with [place] on an as-needed basis.

## 2020-10-20 ENCOUNTER — Other Ambulatory Visit: Payer: Self-pay | Admitting: Pain Medicine

## 2020-10-20 DIAGNOSIS — M549 Dorsalgia, unspecified: Secondary | ICD-10-CM

## 2020-10-20 DIAGNOSIS — G8929 Other chronic pain: Secondary | ICD-10-CM

## 2020-10-30 ENCOUNTER — Other Ambulatory Visit: Payer: Self-pay

## 2020-10-30 ENCOUNTER — Ambulatory Visit
Admission: RE | Admit: 2020-10-30 | Discharge: 2020-10-30 | Disposition: A | Discharge status: Home / Self Care | Payer: Medicare Other | Source: Ambulatory Visit | Attending: Pain Medicine | Admitting: Pain Medicine

## 2020-10-30 DIAGNOSIS — G8929 Other chronic pain: Secondary | ICD-10-CM

## 2021-08-18 DIAGNOSIS — I10 Essential (primary) hypertension: Secondary | ICD-10-CM | POA: Diagnosis not present

## 2021-08-18 DIAGNOSIS — E785 Hyperlipidemia, unspecified: Secondary | ICD-10-CM | POA: Diagnosis not present

## 2021-08-23 DIAGNOSIS — F17210 Nicotine dependence, cigarettes, uncomplicated: Secondary | ICD-10-CM | POA: Diagnosis not present

## 2021-08-23 DIAGNOSIS — G243 Spasmodic torticollis: Secondary | ICD-10-CM | POA: Diagnosis not present

## 2021-08-23 DIAGNOSIS — Z0001 Encounter for general adult medical examination with abnormal findings: Secondary | ICD-10-CM | POA: Diagnosis not present

## 2021-08-23 DIAGNOSIS — G44019 Episodic cluster headache, not intractable: Secondary | ICD-10-CM | POA: Diagnosis not present

## 2021-08-23 DIAGNOSIS — K8020 Calculus of gallbladder without cholecystitis without obstruction: Secondary | ICD-10-CM | POA: Diagnosis not present

## 2021-08-23 DIAGNOSIS — B351 Tinea unguium: Secondary | ICD-10-CM | POA: Diagnosis not present

## 2021-11-24 DIAGNOSIS — I10 Essential (primary) hypertension: Secondary | ICD-10-CM | POA: Diagnosis not present

## 2021-11-24 DIAGNOSIS — E785 Hyperlipidemia, unspecified: Secondary | ICD-10-CM | POA: Diagnosis not present

## 2021-11-24 DIAGNOSIS — R7301 Impaired fasting glucose: Secondary | ICD-10-CM | POA: Diagnosis not present

## 2021-11-28 DIAGNOSIS — B351 Tinea unguium: Secondary | ICD-10-CM | POA: Diagnosis not present

## 2021-11-28 DIAGNOSIS — I10 Essential (primary) hypertension: Secondary | ICD-10-CM | POA: Diagnosis not present

## 2021-11-28 DIAGNOSIS — G44019 Episodic cluster headache, not intractable: Secondary | ICD-10-CM | POA: Diagnosis not present

## 2021-11-28 DIAGNOSIS — K760 Fatty (change of) liver, not elsewhere classified: Secondary | ICD-10-CM | POA: Diagnosis not present

## 2022-03-20 ENCOUNTER — Encounter (INDEPENDENT_AMBULATORY_CARE_PROVIDER_SITE_OTHER): Payer: Self-pay

## 2022-06-01 DIAGNOSIS — K760 Fatty (change of) liver, not elsewhere classified: Secondary | ICD-10-CM | POA: Diagnosis not present

## 2022-06-01 DIAGNOSIS — I10 Essential (primary) hypertension: Secondary | ICD-10-CM | POA: Diagnosis not present

## 2022-06-01 DIAGNOSIS — R7301 Impaired fasting glucose: Secondary | ICD-10-CM | POA: Diagnosis not present

## 2022-06-09 DIAGNOSIS — K8020 Calculus of gallbladder without cholecystitis without obstruction: Secondary | ICD-10-CM | POA: Diagnosis not present

## 2022-06-09 DIAGNOSIS — G243 Spasmodic torticollis: Secondary | ICD-10-CM | POA: Diagnosis not present

## 2022-06-09 DIAGNOSIS — B351 Tinea unguium: Secondary | ICD-10-CM | POA: Diagnosis not present

## 2022-06-09 DIAGNOSIS — G44019 Episodic cluster headache, not intractable: Secondary | ICD-10-CM | POA: Diagnosis not present

## 2022-06-09 DIAGNOSIS — E785 Hyperlipidemia, unspecified: Secondary | ICD-10-CM | POA: Diagnosis not present

## 2022-06-26 ENCOUNTER — Other Ambulatory Visit: Payer: Self-pay

## 2022-06-26 MED ORDER — BUPROPION HCL ER (SR) 150 MG PO TB12: 150.0000 mg | ORAL_TABLET | Freq: Two times a day (BID) | ORAL | 1 refills | Status: DC

## 2022-06-26 MED ORDER — TESTOSTERONE 20.25 MG/ACT (1.62%) TD GEL: TRANSDERMAL | 2 refills | Status: DC

## 2022-06-26 MED ORDER — QUETIAPINE FUMARATE 400 MG PO TABS: 400.0000 mg | ORAL_TABLET | Freq: Every day | ORAL | 0 refills | Status: DC

## 2022-07-04 DIAGNOSIS — M546 Pain in thoracic spine: Secondary | ICD-10-CM | POA: Diagnosis not present

## 2022-07-04 DIAGNOSIS — M9902 Segmental and somatic dysfunction of thoracic region: Secondary | ICD-10-CM | POA: Diagnosis not present

## 2022-07-04 DIAGNOSIS — M545 Low back pain, unspecified: Secondary | ICD-10-CM | POA: Diagnosis not present

## 2022-07-04 DIAGNOSIS — M9903 Segmental and somatic dysfunction of lumbar region: Secondary | ICD-10-CM | POA: Diagnosis not present

## 2022-07-24 ENCOUNTER — Ambulatory Visit (INDEPENDENT_AMBULATORY_CARE_PROVIDER_SITE_OTHER): Payer: 59 | Admitting: Internal Medicine

## 2022-07-24 VITALS — BP 123/73 | HR 79 | Resp 16 | Ht 67.0 in | Wt 180.0 lb

## 2022-07-24 DIAGNOSIS — G4733 Obstructive sleep apnea (adult) (pediatric): Secondary | ICD-10-CM

## 2022-07-24 DIAGNOSIS — I10 Essential (primary) hypertension: Secondary | ICD-10-CM

## 2022-07-24 NOTE — Progress Notes (Signed)
Sleep Medicine   Office Visit  Patient Name: Jeremiah [name] DOB: Jul 13, 1965 MRN [id number]    Chief Complaint: history of OSA  Brief History:  Jeremiah [name] presents with a 7 year history of sleep apnea.  The patient was previously on CPAP, but was non compliant.  Sleep quality is poor. This is noted most nights. The patient does not have a bed partner to report symptoms at night. The patient relates the following symptoms: Loud snoring, excessive daytime sleepiness, morning headaches, and frequent awakenings are also present. The patient does not have consistent bedtime or wake up time.   Sleep quality is worse when outside home environment.  Patient has noted restlessness of his legs at night.  The patient  relates sleepwalking behavior during the night.  The patient reports a history of psychiatric problems. The Epworth Sleepiness Score is 14 out of 24 .  The patient relates  Cardiovascular risk factors include: hypertension.    ROS  General: (-) fever, (-) chills, (-) night sweat Nose and Sinuses: (-) nasal stuffiness or itchiness, (-) postnasal drip, (-) nosebleeds, (-) sinus trouble. Mouth and Throat: (-) sore throat, (-) hoarseness. Neck: (-) swollen glands, (-) enlarged thyroid, (-) neck pain. Respiratory: - cough, - shortness of breath, - wheezing. Neurologic: - numbness, - tingling. Psychiatric: + anxiety, + depression Sleep behavior: -sleep paralysis -hypnogogic hallucinations -dream enactment      -vivid dreams -cataplexy -night terrors -sleep walking   Current Medication: Outpatient Encounter Medications as of 07/24/2022  Medication Sig   amLODipine (NORVASC) 10 MG tablet Take 10 mg by mouth daily.   Arginine (L-ARGININE DOUBLE STRENGTH) 1000 MG TABS Take 5 tablets by mouth.   atorvastatin (LIPITOR) 40 MG tablet Take 40 mg by mouth at bedtime.   bisacodyl (DULCOLAX) 5 MG EC tablet Take 5 mg by mouth daily as needed for mild constipation or moderate constipation (constipation).     buPROPion (WELLBUTRIN SR) 150 MG 12 hr tablet Take 1 tablet (150 mg total) by mouth 2 (two) times daily.   butalbital-acetaminophen-caffeine (FIORICET) 50-325-40 MG tablet Take by mouth.   carvedilol (COREG) 25 MG tablet Take 50 mg by mouth 2 (two) times daily with a meal.    chlorthalidone (HYGROTON) 25 MG tablet Take 25 mg by mouth daily.   Citrulline (PURE L-CITRULLINE) 600 MG CAPS Take 5 capsules by mouth.   cyanocobalamin (,VITAMIN B-12,) 1000 MCG/ML injection Inject 1 mL (1,000 mcg total) into the muscle daily.   ergocalciferol (VITAMIN D2) 1.25 MG (50000 UT) capsule Take by mouth.   fluvoxaMINE (LUVOX) 100 MG tablet Take 1 tablet (100 mg total) by mouth at bedtime.   galantamine (RAZADYNE ER) 24 MG 24 hr capsule Take 24 mg by mouth daily with breakfast.   hydrOXYzine (ATARAX/VISTARIL) 50 MG tablet Take 2 tablets (100 mg total) by mouth at bedtime as needed (sleep).   magnesium hydroxide (MILK OF MAGNESIA) 400 MG/5ML suspension Take 30 mLs by mouth daily as needed for mild constipation.   melatonin 3 MG TABS tablet Take by mouth.   metoprolol succinate (TOPROL-XL) 50 MG 24 hr tablet Take 1 tablet (50 mg total) by mouth daily.   Omega 3-6-9 Fatty Acids (OMEGA-3-6-9 PO) Take by mouth.   ondansetron (ZOFRAN) 4 MG tablet Take 1 tablet (4 mg total) by mouth every 8 (eight) hours as needed for nausea or vomiting.   polyethylene glycol (MIRALAX) packet Take 17 g by mouth daily.   QUEtiapine (SEROQUEL) 400 MG tablet Take 1 tablet (400 mg  total) by mouth at bedtime.   tamsulosin (FLOMAX) 0.4 MG CAPS capsule Take 0.4 mg by mouth daily.   temazepam (RESTORIL) 15 MG capsule Take 1 capsule (15 mg total) by mouth at bedtime.   Testosterone 20.25 MG/ACT (1.62%) GEL SMARTSIG:40.5 Milligram(s) Topical Every Morning   venlafaxine XR (EFFEXOR-XR) 150 MG 24 hr capsule Take 1 capsule (150 mg total) by mouth daily with breakfast.   [DISCONTINUED] amLODipine (NORVASC) 5 MG tablet Take 1 tablet (5 mg total) by  mouth daily. (Patient taking differently: Take 10 mg by mouth daily.)   No facility-administered encounter medications on file as of 07/24/2022.    Surgical History: Past Surgical History:  Procedure Laterality Date   BACK SURGERY     COLONOSCOPY     COLONOSCOPY WITH PROPOFOL N/A 10/12/2014   Procedure: COLONOSCOPY WITH PROPOFOL;  Surgeon: Hulen Luster, MD;  Location[facility] ENDOSCOPY;  Service: Gastroenterology;  Laterality: N/A;   LUMBAR SPINE SURGERY     TONSILLECTOMY      Medical History: Past Medical History:  Diagnosis Date   Anxiety    Benign essential HTN 08/27/2014   Borderline personality disorder (HCC)    BP (high blood pressure) 01/16/2015   Difficulty urinating    Diverticulosis    Dysuria    External hemorrhoid    Gastroparesis    Hypertension    Hypokalemia    Major depression    Microscopic hematuria    Nicotine addiction    Over weight    Rectal fistula    Schizophrenia ([place])    Stroke ([place])     Family History: Non contributory to the present illness  Social History: Social History   Socioeconomic History   Marital status: Single    Spouse name: Not on file   Number of children: Not on file   Years of education: Not on file   Highest education level: Not on file  Occupational History   Not on file  Tobacco Use   Smoking status: Former    Types: Cigarettes    Quit date: 08/19/2014    Years since quitting: 7.9   Smokeless tobacco: Never  Substance and Sexual Activity   Alcohol use: No    Alcohol/week: 0.0 standard drinks of alcohol    Comment: last drink was in march 2016   Drug use: No   Sexual activity: Not on file  Other Topics Concern   Not on file  Social History Narrative   Not on file   Social Determinants of Health   Financial Resource Strain: Not on file  Food Insecurity: Not on file  Transportation Needs: Not on file  Physical Activity: Not on file  Stress: Not on file  Social Connections: Not on file  Intimate Partner  Violence: Not on file    Vital Signs: Blood pressure 123/73, pulse 79, resp. rate 16, height '5\' 7"'$  (1.702 m), weight 180 lb (81.6 kg), SpO2 98 %. Body mass index is 28.19 kg/m.   Examination: General Appearance: The patient is well-developed, well-nourished, and in no distress. Neck Circumference: 39 cm Skin: Gross inspection of skin unremarkable. Head: normocephalic, no gross deformities. Eyes: no gross deformities noted. ENT: ears appear grossly normal Neurologic: Alert and oriented. No involuntary movements.    STOP BANG RISK ASSESSMENT S (snore) Have you been told that you snore?     YES   T (tired) Are you often tired, fatigued, or sleepy during the day?   YES  O (obstruction) Do you stop breathing,  choke, or gasp during sleep? NO   P (pressure) Do you have or are you being treated for high blood pressure? YES   B (BMI) Is your body index greater than 35 kg/m? NO   A (age) Are you 65 years old or older? YES   N (neck) Do you have a neck circumference greater than 16 inches?   NO   G (gender) Are you a male? YES   TOTAL STOP/BANG "YES" ANSWERS 5                                                               A STOP-Bang score of 2 or less is considered low risk, and a score of 5 or more is high risk for having either moderate or severe OSA. For people who score 3 or 4, doctors may need to perform further assessment to determine how likely they are to have OSA.         EPWORTH SLEEPINESS SCALE:  Scale:  (0)= no chance of dozing; (1)= slight chance of dozing; (2)= moderate chance of dozing; (3)= high chance of dozing  Chance  Situtation    Sitting and reading: 3    Watching TV: 3    Sitting Inactive in public: 2    As a passenger in car: 2      Lying down to rest: 3    Sitting and talking: 0    Sitting quielty after lunch: 1    In a car, stopped in traffic: 0   TOTAL SCORE:   14 out of 24    SLEEP STUDIES:  He has had a sleep study about a  year ago and about 7 years ago but does not have a copy of the result   LABS: No results found for this or any previous visit (from the past 2160 hour(s)).  Radiology: MR THORACIC SPINE WO CONTRAST  Result Date: 10/31/2020 CLINICAL DATA:  Chronic upper and mid back pain since fall 1 year ago. EXAM: MRI THORACIC SPINE WITHOUT CONTRAST TECHNIQUE: Multiplanar, multisequence MR imaging of the thoracic spine was performed. No intravenous contrast was administered. COMPARISON:  Chest x-ray dated August 04, 2017. FINDINGS: Alignment: Mild dextroscoliosis of the upper thoracic spine. No listhesis. Vertebrae: No fracture, evidence of discitis, or bone lesion. Cord:  Normal signal and morphology. Paraspinal and other soft tissues: Negative. Disc levels: T1-T2 to T6-T7: Negative. T7-T8: Small central disc protrusion.  No stenosis. T8-T9: Small shallow central disc protrusion.  No stenosis. T9-T10: Small left paracentral disc protrusion.  No stenosis. T10-T11: Small posterior disc osteophyte complex effacing the ventral thecal sac. Borderline mild spinal canal stenosis. No neuroforaminal stenosis. T11-T12: Small right paracentral disc protrusion.  No stenosis. IMPRESSION: 1. Mild multilevel degenerative disc disease as described above. No significant stenosis or impingement. Electronically Signed   By: Titus Dubin M.D.   On: 10/31/2020 17:27   MR CERVICAL SPINE WO CONTRAST  Result Date: 10/31/2020 CLINICAL DATA:  Chronic neck and left shoulder pain with intermittent left arm numbness. EXAM: MRI CERVICAL SPINE WITHOUT CONTRAST TECHNIQUE: Multiplanar, multisequence MR imaging of the cervical spine was performed. No intravenous contrast was administered. COMPARISON:  MRI cervical spine dated January 06, 2015. FINDINGS: Alignment: Physiologic. Vertebrae: No fracture, evidence of discitis, or bone lesion. Cord:  Normal signal and morphology. Posterior Fossa, vertebral arteries, paraspinal tissues: Negative. Disc  levels: C2-C3: Negative disc. New severe left neuroforaminal stenosis due to mild uncovertebral and advanced facet arthropathy. No spinal canal or right neuroforaminal stenosis. C3-C4: Negative disc. Progressive now severe left neuroforaminal stenosis due to facet uncovertebral hypertrophy. No spinal canal or right neuroforaminal stenosis. C4-C5: Negative disc. Progressive now severe left neuroforaminal stenosis due to facet uncovertebral hypertrophy. No spinal canal or right neuroforaminal stenosis. C5-C6: New mild right eccentric disc bulging. Progressive now moderate to severe right neuroforaminal stenosis due to uncovertebral hypertrophy. No spinal canal or left neuroforaminal stenosis. C6-C7: Chronic mild disc bulging with superimposed central disc protrusion. Progressive bilateral uncovertebral hypertrophy. Unchanged mild to moderate spinal canal stenosis. Worsened now severe bilateral neuroforaminal stenosis. C7-T1: Negative disc. Mild bilateral facet arthropathy. No stenosis. IMPRESSION: 1. Progressive now severe left neuroforaminal stenosis from C2-C3 to C4-C5 due to facet uncovertebral hypertrophy. 2. Progressive now moderate to severe right neuroforaminal stenosis at C5-C6 due to uncovertebral hypertrophy. 3. Worsened now severe bilateral neuroforaminal stenosis at C6-C7 due to uncovertebral hypertrophy. Unchanged mild to moderate spinal canal stenosis. Electronically Signed   By: Titus Dubin M.D.   On: 10/31/2020 17:17    No results found.  No results found.    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 OSA (obstructive sleep apnea) PLAN OSA:   Patient evaluation suggests high risk of sleep disordered breathing due to history of OSA (AHI of  13.8 on PSG of 11/19/2015) with symptoms of snoring, daytime sleepiness, morning headaches and frequent awakenings. Patient has comorbid cardiovascular risk factors including: hypertension which could be exacerbated by pathologic sleep-disordered breathing.  Suggest: PSG to assess/treat the patient's sleep disordered breathing. The patient was also counselled on weight loss to optimize sleep health.    2. Hypertension, unspecified type Hypertension Counseling:   The following hypertensive lifestyle modification were recommended and discussed:  1. Limiting alcohol intake to less than 1 oz/day of ethanol:(24 oz of beer or 8 oz of wine or 2 oz of 100-proof whiskey). 2. Take baby ASA 81 mg daily. 3. Importance of regular aerobic exercise and losing weight. 4. Reduce dietary saturated fat and cholesterol intake for overall cardiovascular health. 5. Maintaining adequate dietary potassium, calcium, and magnesium intake. 6. Regular monitoring of the blood pressure. 7. Reduce sodium intake to less than 100 mmol/day (less than 2.3 gm of sodium or less than 6 gm of sodium choride)       General Counseling: I have discussed the findings of the evaluation and examination with Jeremiah [name].  I have also discussed any further diagnostic evaluation thatmay be needed or ordered today. Jeremiah [name] verbalizes understanding of the findings of todays visit. We also reviewed his medications today and discussed drug interactions and side effects including but not limited excessive drowsiness and altered mental states. We also discussed that there is always a risk not just to  him but also people  around him. he has been encouraged to call the office with any questions or concerns that should arise related to todays visit.  No orders of the defined types were placed in this encounter.       I have personally obtained a history, evaluated the patient, evaluated pertinent data, formulated the assessment and plan and placed orders.   This patient was seen today by Tressie Ellis, PA-C in collaboration with Dr. Devona Konig.   Allyne Gee, MD [facility] Diplomate ABMS Pulmonary and Critical Care Medicine Sleep medicine

## 2022-07-25 ENCOUNTER — Other Ambulatory Visit: Payer: Self-pay | Admitting: Internal Medicine

## 2022-07-28 ENCOUNTER — Other Ambulatory Visit: Payer: Self-pay | Admitting: Internal Medicine

## 2022-08-15 DIAGNOSIS — G4730 Sleep apnea, unspecified: Secondary | ICD-10-CM | POA: Diagnosis not present

## 2022-09-04 ENCOUNTER — Other Ambulatory Visit: Payer: Self-pay | Admitting: Internal Medicine

## 2022-09-06 ENCOUNTER — Other Ambulatory Visit: Payer: Self-pay | Admitting: Internal Medicine

## 2022-09-09 DIAGNOSIS — I10 Essential (primary) hypertension: Secondary | ICD-10-CM | POA: Diagnosis not present

## 2022-09-09 DIAGNOSIS — H6993 Unspecified Eustachian tube disorder, bilateral: Secondary | ICD-10-CM | POA: Diagnosis not present

## 2022-09-09 DIAGNOSIS — J069 Acute upper respiratory infection, unspecified: Secondary | ICD-10-CM | POA: Diagnosis not present

## 2022-09-09 DIAGNOSIS — Z20822 Contact with and (suspected) exposure to covid-19: Secondary | ICD-10-CM | POA: Diagnosis not present

## 2022-09-12 ENCOUNTER — Encounter: Payer: 59 | Admitting: Internal Medicine

## 2022-10-17 ENCOUNTER — Encounter: Payer: 59 | Admitting: Internal Medicine

## 2022-11-03 ENCOUNTER — Other Ambulatory Visit: Payer: 59

## 2022-11-03 ENCOUNTER — Other Ambulatory Visit: Payer: Self-pay | Admitting: Internal Medicine

## 2022-11-03 DIAGNOSIS — E785 Hyperlipidemia, unspecified: Secondary | ICD-10-CM

## 2022-11-03 DIAGNOSIS — R7301 Impaired fasting glucose: Secondary | ICD-10-CM

## 2022-11-03 DIAGNOSIS — I10 Essential (primary) hypertension: Secondary | ICD-10-CM

## 2022-11-03 DIAGNOSIS — E669 Obesity, unspecified: Secondary | ICD-10-CM

## 2022-11-08 LAB — LIPID PANEL W/O CHOL/HDL RATIO
Cholesterol, Total: 121 mg/dL (ref 100–199)
HDL: 50 mg/dL (ref >39)
LDL Chol Calc (NIH): 59 mg/dL (ref 0–99)
Triglycerides: 54 mg/dL (ref 0–149)
VLDL Cholesterol Cal: 12 mg/dL (ref 5–40)

## 2022-11-08 LAB — COMPREHENSIVE METABOLIC PANEL
ALT: 117 IU/L — ABNORMAL HIGH (ref 0–44)
AST: 41 IU/L — ABNORMAL HIGH (ref 0–40)
Albumin: 4.7 g/dL (ref 3.8–4.9)
Alkaline Phosphatase: 71 IU/L (ref 44–121)
BUN/Creatinine Ratio: 12 (ref 9–20)
BUN: 11 mg/dL (ref 6–24)
Bilirubin Total: 0.8 mg/dL (ref 0.0–1.2)
CO2: 26 mmol/L (ref 20–29)
Calcium: 10 mg/dL (ref 8.7–10.2)
Chloride: 97 mmol/L (ref 96–106)
Creatinine, Ser: 0.92 mg/dL (ref 0.76–1.27)
Globulin, Total: 2.2 g/dL (ref 1.5–4.5)
Glucose: 105 mg/dL — ABNORMAL HIGH (ref 70–99)
Potassium: 3.7 mmol/L (ref 3.5–5.2)
Sodium: 137 mmol/L (ref 134–144)
Total Protein: 6.9 g/dL (ref 6.0–8.5)
eGFR: 98 mL/min/{1.73_m2} (ref >59)

## 2022-11-08 LAB — CBC WITH DIFFERENTIAL/PLATELET
Basophils Absolute: 0.1 10*3/uL (ref 0.0–0.2)
Basos: 1 %
EOS (ABSOLUTE): 0.2 10*3/uL (ref 0.0–0.4)
Eos: 4 %
Hematocrit: 45.8 % (ref 37.5–51.0)
Hemoglobin: 15.3 g/dL (ref 13.0–17.7)
Immature Grans (Abs): 0 10*3/uL (ref 0.0–0.1)
Immature Granulocytes: 0 %
Lymphocytes Absolute: 1.4 10*3/uL (ref 0.7–3.1)
Lymphs: 29 %
MCH: 29.5 pg (ref 26.6–33.0)
MCHC: 33.4 g/dL (ref 31.5–35.7)
MCV: 88 fL (ref 79–97)
Monocytes Absolute: 0.6 10*3/uL (ref 0.1–0.9)
Monocytes: 11 %
Neutrophils Absolute: 2.8 10*3/uL (ref 1.4–7.0)
Neutrophils: 55 %
Platelets: 334 10*3/uL (ref 150–450)
RBC: 5.19 x10E6/uL (ref 4.14–5.80)
RDW: 12 % (ref 11.6–15.4)
WBC: 5 10*3/uL (ref 3.4–10.8)

## 2022-11-08 LAB — CK: Total CK: 212 U/L (ref 41–331)

## 2022-11-08 LAB — TESTOSTERONE,FREE AND TOTAL
Testosterone, Free: 17.3 pg/mL (ref 7.2–24.0)
Testosterone: >1500 ng/dL — ABNORMAL HIGH (ref 264–916)

## 2022-11-10 ENCOUNTER — Ambulatory Visit (INDEPENDENT_AMBULATORY_CARE_PROVIDER_SITE_OTHER): Payer: 59 | Admitting: Internal Medicine

## 2022-11-10 VITALS — BP 130/88 | HR 81 | Ht 67.0 in | Wt 184.0 lb

## 2022-11-10 DIAGNOSIS — I10 Essential (primary) hypertension: Secondary | ICD-10-CM

## 2022-11-10 DIAGNOSIS — F319 Bipolar disorder, unspecified: Secondary | ICD-10-CM

## 2022-11-10 DIAGNOSIS — K760 Fatty (change of) liver, not elsewhere classified: Secondary | ICD-10-CM | POA: Insufficient documentation

## 2022-11-10 DIAGNOSIS — Z1331 Encounter for screening for depression: Secondary | ICD-10-CM

## 2022-11-10 DIAGNOSIS — R748 Abnormal levels of other serum enzymes: Secondary | ICD-10-CM

## 2022-11-10 DIAGNOSIS — E782 Mixed hyperlipidemia: Secondary | ICD-10-CM | POA: Diagnosis not present

## 2022-11-10 DIAGNOSIS — F418 Other specified anxiety disorders: Secondary | ICD-10-CM

## 2022-11-10 MED ORDER — AMLODIPINE BESYLATE 10 MG PO TABS: 10.0000 mg | ORAL_TABLET | Freq: Every morning | ORAL | 0 refills | Status: DC

## 2022-11-10 MED ORDER — ALPRAZOLAM 0.25 MG PO TABS
0.2500 mg | ORAL_TABLET | Freq: Three times a day (TID) | ORAL | 0 refills | Status: AC | PRN
Start: 2022-11-10 — End: 2022-11-13

## 2022-11-10 MED ORDER — QUETIAPINE FUMARATE 300 MG PO TABS
ORAL_TABLET | ORAL | 2 refills | Status: DC
Start: 2022-11-10 — End: 2023-01-19

## 2022-11-10 MED ORDER — QUETIAPINE FUMARATE 100 MG PO TABS: ORAL_TABLET | ORAL | 2 refills | Status: DC

## 2022-11-10 MED ORDER — BUSPIRONE HCL 7.5 MG PO TABS: 7.5000 mg | ORAL_TABLET | Freq: Two times a day (BID) | ORAL | 0 refills | Status: DC

## 2022-11-10 MED ORDER — CHLORTHALIDONE 25 MG PO TABS
25.0000 mg | ORAL_TABLET | Freq: Every morning | ORAL | 0 refills | Status: DC
Start: 2022-11-10 — End: 2022-11-14

## 2022-11-10 NOTE — Progress Notes (Signed)
Established Patient Office Visit  Subjective:  Patient ID: Jeremiah [name], male    DOB: 1965-10-20  Age: 57 y.o. MRN: [id number]  Chief Complaint  Patient presents with   Annual Exam    CPE    No new complaints, here for CPE, lab review and medication refills. LDL and TC well controlled on lab review. Triglycerides also satisfactory but CMP notable for deterioration in transaminases. Only admits to  binge drinking every 3 wks where he'd drink 6-12 beers at a sitting. Denies unprotected sex.      No other concerns at this time.   Past Medical History:  Diagnosis Date   Anxiety    Benign essential HTN 08/27/2014   Borderline personality disorder (HCC)    BP (high blood pressure) 01/16/2015   Difficulty urinating    Diverticulosis    Dysuria    External hemorrhoid    Gastroparesis    Hypertension    Hypokalemia    Major depression    Microscopic hematuria    Nicotine addiction    Over weight    Rectal fistula    Schizophrenia (HCC)    Stroke [facility])     Past Surgical History:  Procedure Laterality Date   BACK SURGERY     COLONOSCOPY     COLONOSCOPY WITH PROPOFOL N/A 10/12/2014   Procedure: COLONOSCOPY WITH PROPOFOL;  Surgeon: Wallace Cullens, MD;  Location: [facility] ENDOSCOPY;  Service: Gastroenterology;  Laterality: N/A;   LUMBAR SPINE SURGERY     TONSILLECTOMY      Social History   Socioeconomic History   Marital status: Single    Spouse name: Not on file   Number of children: Not on file   Years of education: Not on file   Highest education level: Not on file  Occupational History   Not on file  Tobacco Use   Smoking status: Former    Types: Cigarettes    Quit date: 08/19/2014    Years since quitting: 8.2   Smokeless tobacco: Never  Substance and Sexual Activity   Alcohol use: No    Alcohol/week: 0.0 standard drinks of alcohol    Comment: last drink was in march 2016   Drug use: No   Sexual activity: Not on file  Other Topics Concern   Not on file  Social  History Narrative   Not on file   Social Determinants of Health   Financial Resource Strain: Not on file  Food Insecurity: Not on file  Transportation Needs: Not on file  Physical Activity: Not on file  Stress: Not on file  Social Connections: Not on file  Intimate Partner Violence: Not on file    Family History  Problem Relation Age of Onset   Stroke Mother    Stroke Maternal Grandmother    Diabetes Mellitus II Sister     Allergies  Allergen Reactions   Diflucan [Fluconazole] Diarrhea   Ciprofloxacin Other (See Comments)    GI distress   Milk-Related Compounds Other (See Comments)    GI problems   Other     Other reaction(Kyri Shader): Other (See Comments) GI problems   Pepto-Bismol [Bismuth Subsalicylate] Other (See Comments)    "Bad reaction"   Trazodone And Nefazodone Other (See Comments)    Burn tongue and causes blisters   Elemental Sulfur Other (See Comments)    GI distress     Review of Systems  Constitutional: Negative.   HENT: Negative.    Eyes: Negative.   Respiratory: Negative.  Cardiovascular: Negative.   Gastrointestinal: Negative.   Genitourinary: Negative.   Skin: Negative.   Neurological: Negative.   Endo/Heme/Allergies: Negative.   Psychiatric/Behavioral:  The patient is nervous/anxious.        Objective:   BP 130/88   Pulse 81   Ht 5\' 7"  (1.702 m)   Wt 184 lb (83.5 kg)   SpO2 98%   BMI 28.82 kg/m   Vitals:   11/10/22 1431  BP: 130/88  Pulse: 81  Height: 5\' 7"  (1.702 m)  Weight: 184 lb (83.5 kg)  SpO2: 98%  BMI (Calculated): 28.81    Physical Exam Vitals reviewed.  Constitutional:      Appearance: Normal appearance.  HENT:     Head: Normocephalic.     Left Ear: There is no impacted cerumen.     Nose: Nose normal.     Mouth/Throat:     Mouth: Mucous membranes are moist.     Pharynx: No posterior oropharyngeal erythema.  Eyes:     Extraocular Movements: Extraocular movements intact.     Pupils: Pupils are equal, round,  and reactive to light.  Cardiovascular:     Rate and Rhythm: Regular rhythm.     Chest Wall: PMI is not displaced.     Pulses: Normal pulses.     Heart sounds: Normal heart sounds. No murmur heard. Pulmonary:     Effort: Pulmonary effort is normal.     Breath sounds: Normal air entry. No rhonchi or rales.  Abdominal:     General: Abdomen is flat. Bowel sounds are normal. There is no distension.     Palpations: Abdomen is soft. There is no hepatomegaly, splenomegaly or mass.     Tenderness: There is no abdominal tenderness.  Musculoskeletal:        General: Normal range of motion.     Cervical back: Normal range of motion and neck supple.     Right lower leg: No edema.     Left lower leg: No edema.  Skin:    General: Skin is warm and dry.  Neurological:     General: No focal deficit present.     Mental Status: He is alert and oriented to person, place, and time.     Cranial Nerves: No cranial nerve deficit.     Motor: No weakness.  Psychiatric:        Mood and Affect: Mood normal.        Behavior: Behavior normal.      No results found for any visits on 11/10/22.  Recent Results (from the past 2160 hour(Galit Urich))  CBC with Differential/Platelet     Status: None   Collection Time: 11/03/22  2:53 PM  Result Value Ref Range   WBC 5.0 3.4 - 10.8 x10E3/uL   RBC 5.19 4.14 - 5.80 x10E6/uL   Hemoglobin 15.3 13.0 - 17.7 g/dL   Hematocrit 91.4 78.2 - 51.0 %   MCV 88 79 - 97 fL   MCH 29.5 26.6 - 33.0 pg   MCHC 33.4 31.5 - 35.7 g/dL   RDW 95.6 21.3 - 08.6 %   Platelets 334 150 - 450 x10E3/uL   Neutrophils 55 Not Estab. %   Lymphs 29 Not Estab. %   Monocytes 11 Not Estab. %   Eos 4 Not Estab. %   Basos 1 Not Estab. %   Neutrophils Absolute 2.8 1.4 - 7.0 x10E3/uL   Lymphocytes Absolute 1.4 0.7 - 3.1 x10E3/uL   Monocytes Absolute 0.6 0.1 - 0.9 x10E3/uL  EOS (ABSOLUTE) 0.2 0.0 - 0.4 x10E3/uL   Basophils Absolute 0.1 0.0 - 0.2 x10E3/uL   Immature Granulocytes 0 Not Estab. %    Immature Grans (Abs) 0.0 0.0 - 0.1 x10E3/uL  Comprehensive metabolic panel     Status: Abnormal   Collection Time: 11/03/22  2:53 PM  Result Value Ref Range   Glucose 105 (H) 70 - 99 mg/dL   BUN 11 6 - 24 mg/dL   Creatinine, Ser 1.61 0.76 - 1.27 mg/dL   eGFR 98 >09 UE/AVW/0.98   BUN/Creatinine Ratio 12 9 - 20   Sodium 137 134 - 144 mmol/L   Potassium 3.7 3.5 - 5.2 mmol/L   Chloride 97 96 - 106 mmol/L   CO2 26 20 - 29 mmol/L   Calcium 10.0 8.7 - 10.2 mg/dL   Total Protein 6.9 6.0 - 8.5 g/dL   Albumin 4.7 3.8 - 4.9 g/dL   Globulin, Total 2.2 1.5 - 4.5 g/dL   Bilirubin Total 0.8 0.0 - 1.2 mg/dL   Alkaline Phosphatase 71 44 - 121 IU/L   AST 41 (H) 0 - 40 IU/L   ALT 117 (H) 0 - 44 IU/L  Lipid Panel w/o Chol/HDL Ratio     Status: None   Collection Time: 11/03/22  2:53 PM  Result Value Ref Range   Cholesterol, Total 121 100 - 199 mg/dL   Triglycerides 54 0 - 149 mg/dL   HDL 50 >11 mg/dL   VLDL Cholesterol Cal 12 5 - 40 mg/dL   LDL Chol Calc (NIH) 59 0 - 99 mg/dL  Testosterone,Free and Total     Status: Abnormal   Collection Time: 11/03/22  2:53 PM  Result Value Ref Range   Testosterone >1500 (H) 264 - 916 ng/dL    Comment: Adult male reference interval is based on a population of healthy nonobese males (BMI <30) between 74 and 56 years old. Travison, et.al. JCEM [id number]. PMID: [id number].    Testosterone, Free 17.3 7.2 - 24.0 pg/mL  CK     Status: None   Collection Time: 11/03/22  2:53 PM  Result Value Ref Range   Total CK 212 41 - 331 U/L      Assessment & Plan:  As per problem list  Problem List Items Addressed This Visit       Cardiovascular and Mediastinum   Hypertension   Relevant Medications   chlorthalidone (HYGROTON) 25 MG tablet   amLODipine (NORVASC) 10 MG tablet     Digestive   Fatty liver - Primary   Relevant Orders   Hepatitis C Antibody   US Abdomen Limited RUQ (LIVER/GB)     Other   Mixed hyperlipidemia   Relevant Medications    chlorthalidone (HYGROTON) 25 MG tablet   amLODipine (NORVASC) 10 MG tablet   Other Visit Diagnoses     Abnormal transaminases       Relevant Orders   Hepatitis C Antibody   Bipolar affective disorder, remission status unspecified (HCC)       Relevant Medications   QUEtiapine (SEROQUEL) 100 MG tablet   QUEtiapine (SEROQUEL) 300 MG tablet   busPIRone (BUSPAR) 7.5 MG tablet   Situational anxiety       Relevant Medications   busPIRone (BUSPAR) 7.5 MG tablet   ALPRAZolam (XANAX) 0.25 MG tablet       Return in about 2 weeks (around 11/24/2022) for lab results.   Total time spent: 35 minutes  Luna Fuse, MD  11/10/2022   This document may  have been prepared by [employer] and as such may include unintentional dictation errors.

## 2022-11-13 ENCOUNTER — Telehealth: Payer: Self-pay

## 2022-11-13 NOTE — Telephone Encounter (Signed)
Patient is requesting to get a full hepatitis panel drawn he wants to be checked for A, B, C

## 2022-11-14 ENCOUNTER — Other Ambulatory Visit: Payer: Self-pay

## 2022-11-14 ENCOUNTER — Other Ambulatory Visit: Payer: 59

## 2022-11-14 DIAGNOSIS — K760 Fatty (change of) liver, not elsewhere classified: Secondary | ICD-10-CM | POA: Diagnosis not present

## 2022-11-14 DIAGNOSIS — R748 Abnormal levels of other serum enzymes: Secondary | ICD-10-CM | POA: Diagnosis not present

## 2022-11-14 DIAGNOSIS — F319 Bipolar disorder, unspecified: Secondary | ICD-10-CM

## 2022-11-14 DIAGNOSIS — Z1159 Encounter for screening for other viral diseases: Secondary | ICD-10-CM | POA: Diagnosis not present

## 2022-11-14 DIAGNOSIS — I10 Essential (primary) hypertension: Secondary | ICD-10-CM

## 2022-11-14 MED ORDER — AMLODIPINE BESYLATE 10 MG PO TABS
10.0000 mg | ORAL_TABLET | Freq: Every morning | ORAL | 0 refills | Status: DC
Start: 2022-11-14 — End: 2023-01-29

## 2022-11-14 MED ORDER — CHLORTHALIDONE 25 MG PO TABS
25.0000 mg | ORAL_TABLET | Freq: Every morning | ORAL | 0 refills | Status: DC
Start: 2022-11-14 — End: 2022-12-22

## 2022-11-14 MED ORDER — BUSPIRONE HCL 7.5 MG PO TABS
7.5000 mg | ORAL_TABLET | Freq: Two times a day (BID) | ORAL | 0 refills | Status: DC
Start: 2022-11-14 — End: 2023-01-19

## 2022-11-14 MED ORDER — BUPROPION HCL ER (SR) 150 MG PO TB12: 150.0000 mg | ORAL_TABLET | Freq: Two times a day (BID) | ORAL | 1 refills | Status: DC

## 2022-11-14 NOTE — Telephone Encounter (Signed)
Patient is requesting xanax to be sent to [facility] pharmacy

## 2022-11-14 NOTE — Telephone Encounter (Signed)
Patient informed. 

## 2022-11-15 LAB — HEPATITIS C ANTIBODY: Hep C Virus Ab: NONREACTIVE

## 2022-11-15 MED ORDER — TEMAZEPAM 15 MG PO CAPS: 15.0000 mg | ORAL_CAPSULE | Freq: Every day | ORAL | 0 refills | Status: DC

## 2022-11-16 LAB — SPECIMEN STATUS REPORT

## 2022-11-16 LAB — HEPATITIS A ANTIBODY, IGM: Hep A IgM: NEGATIVE

## 2022-11-20 NOTE — Progress Notes (Signed)
Informed via my chart.

## 2022-11-28 ENCOUNTER — Other Ambulatory Visit: Payer: Self-pay | Admitting: Nurse Practitioner

## 2022-11-28 DIAGNOSIS — I10 Essential (primary) hypertension: Secondary | ICD-10-CM

## 2022-12-04 ENCOUNTER — Telehealth: Payer: Self-pay | Admitting: Internal Medicine

## 2022-12-04 ENCOUNTER — Other Ambulatory Visit: Payer: Self-pay | Admitting: Internal Medicine

## 2022-12-04 ENCOUNTER — Other Ambulatory Visit: Payer: 59

## 2022-12-04 DIAGNOSIS — F411 Generalized anxiety disorder: Secondary | ICD-10-CM

## 2022-12-04 DIAGNOSIS — I10 Essential (primary) hypertension: Secondary | ICD-10-CM | POA: Diagnosis not present

## 2022-12-04 DIAGNOSIS — E785 Hyperlipidemia, unspecified: Secondary | ICD-10-CM | POA: Diagnosis not present

## 2022-12-04 DIAGNOSIS — R7301 Impaired fasting glucose: Secondary | ICD-10-CM | POA: Diagnosis not present

## 2022-12-04 MED ORDER — TEMAZEPAM 15 MG PO CAPS
15.0000 mg | ORAL_CAPSULE | Freq: Every day | ORAL | 2 refills | Status: DC
Start: 2022-12-04 — End: 2022-12-04

## 2022-12-04 MED ORDER — TEMAZEPAM 15 MG PO CAPS
15.0000 mg | ORAL_CAPSULE | Freq: Every day | ORAL | 2 refills | Status: DC
Start: 2022-12-04 — End: 2023-03-05

## 2022-12-04 NOTE — Telephone Encounter (Signed)
Landon from [facility] called and states they did not receive the Rx for temazepam. It looks as though the last time it was prescribed the Rx was printed and not sent electronically. Can you please send it electronically to [facility]?

## 2022-12-05 LAB — CBC WITH DIFFERENTIAL
Basophils Absolute: 0 10*3/uL (ref 0.0–0.2)
Basos: 1 %
EOS (ABSOLUTE): 0.1 10*3/uL (ref 0.0–0.4)
Eos: 4 %
Hematocrit: 43.2 % (ref 37.5–51.0)
Hemoglobin: 14.6 g/dL (ref 13.0–17.7)
Immature Grans (Abs): 0 10*3/uL (ref 0.0–0.1)
Immature Granulocytes: 0 %
Lymphocytes Absolute: 1.3 10*3/uL (ref 0.7–3.1)
Lymphs: 40 %
MCH: 30.8 pg (ref 26.6–33.0)
MCHC: 33.8 g/dL (ref 31.5–35.7)
MCV: 91 fL (ref 79–97)
Monocytes Absolute: 0.4 10*3/uL (ref 0.1–0.9)
Monocytes: 13 %
Neutrophils Absolute: 1.4 10*3/uL (ref 1.4–7.0)
Neutrophils: 42 %
RBC: 4.74 x10E6/uL (ref 4.14–5.80)
RDW: 12 % (ref 11.6–15.4)
WBC: 3.3 10*3/uL — ABNORMAL LOW (ref 3.4–10.8)

## 2022-12-05 LAB — CMP14+EGFR
ALT: 19 IU/L (ref 0–44)
AST: 25 IU/L (ref 0–40)
Albumin: 4.7 g/dL (ref 3.8–4.9)
Alkaline Phosphatase: 53 IU/L (ref 44–121)
BUN/Creatinine Ratio: 9 (ref 9–20)
BUN: 9 mg/dL (ref 6–24)
Bilirubin Total: 0.7 mg/dL (ref 0.0–1.2)
CO2: 25 mmol/L (ref 20–29)
Calcium: 9.9 mg/dL (ref 8.7–10.2)
Chloride: 102 mmol/L (ref 96–106)
Creatinine, Ser: 0.95 mg/dL (ref 0.76–1.27)
Globulin, Total: 1.9 g/dL (ref 1.5–4.5)
Glucose: 95 mg/dL (ref 70–99)
Potassium: 4.3 mmol/L (ref 3.5–5.2)
Sodium: 141 mmol/L (ref 134–144)
Total Protein: 6.6 g/dL (ref 6.0–8.5)
eGFR: 94 mL/min/{1.73_m2} (ref >59)

## 2022-12-05 LAB — HEMOGLOBIN A1C
Est. average glucose Bld gHb Est-mCnc: 108 mg/dL
Hgb A1c MFr Bld: 5.4 % (ref 4.8–5.6)

## 2022-12-05 LAB — LIPID PANEL
Chol/HDL Ratio: 3.7 ratio (ref 0.0–5.0)
Cholesterol, Total: 173 mg/dL (ref 100–199)
HDL: 47 mg/dL (ref >39)
LDL Chol Calc (NIH): 114 mg/dL — ABNORMAL HIGH (ref 0–99)
Triglycerides: 59 mg/dL (ref 0–149)
VLDL Cholesterol Cal: 12 mg/dL (ref 5–40)

## 2022-12-05 LAB — TSH: TSH: 2.12 u[IU]/mL (ref 0.450–4.500)

## 2022-12-06 ENCOUNTER — Ambulatory Visit (INDEPENDENT_AMBULATORY_CARE_PROVIDER_SITE_OTHER): Payer: 59

## 2022-12-06 ENCOUNTER — Telehealth: Payer: Self-pay | Admitting: Internal Medicine

## 2022-12-06 DIAGNOSIS — K760 Fatty (change of) liver, not elsewhere classified: Secondary | ICD-10-CM | POA: Diagnosis not present

## 2022-12-06 NOTE — Telephone Encounter (Signed)
Patient called in requesting his psych referral be sent to [facility]. Phone # [phone]

## 2022-12-08 ENCOUNTER — Ambulatory Visit (INDEPENDENT_AMBULATORY_CARE_PROVIDER_SITE_OTHER): Payer: 59 | Admitting: Internal Medicine

## 2022-12-08 VITALS — BP 116/85 | HR 55 | Ht 67.0 in | Wt 181.4 lb

## 2022-12-08 DIAGNOSIS — E782 Mixed hyperlipidemia: Secondary | ICD-10-CM | POA: Diagnosis not present

## 2022-12-08 DIAGNOSIS — F418 Other specified anxiety disorders: Secondary | ICD-10-CM

## 2022-12-08 DIAGNOSIS — E538 Deficiency of other specified B group vitamins: Secondary | ICD-10-CM | POA: Diagnosis not present

## 2022-12-08 DIAGNOSIS — I10 Essential (primary) hypertension: Secondary | ICD-10-CM | POA: Diagnosis not present

## 2022-12-08 DIAGNOSIS — H9191 Unspecified hearing loss, right ear: Secondary | ICD-10-CM | POA: Diagnosis not present

## 2022-12-08 DIAGNOSIS — E291 Testicular hypofunction: Secondary | ICD-10-CM

## 2022-12-08 MED ORDER — ESCITALOPRAM OXALATE 10 MG PO TABS
10.0000 mg | ORAL_TABLET | Freq: Every day | ORAL | 2 refills | Status: DC
Start: 2022-12-08 — End: 2023-03-05

## 2022-12-08 MED ORDER — NEXLETOL 180 MG PO TABS
180.0000 mg | ORAL_TABLET | Freq: Every day | ORAL | 0 refills | Status: AC
Start: 2022-12-08 — End: 2023-03-08

## 2022-12-08 NOTE — Progress Notes (Signed)
Established Patient Office Visit  Subjective:  Patient ID: Jeremiah [name], male    DOB: [birth year]  Age: 57 y.o. MRN: [id number]  Chief Complaint  Patient presents with   Follow-up    2 week F/U    No new complaints, here for lab review and medication refills. Reports 6 month h/o hearing loss. Labs reviewed and notable for improvement and normalization of transaminases. He stopped Seroquel, statins and stopped drinking. USS notable for Fatty liver.  No other concerns at this time.   Past Medical History:  Diagnosis Date   Anxiety    Benign essential HTN 08/27/2014   Borderline personality disorder (HCC)    BP (high blood pressure) 01/16/2015   Difficulty urinating    Diverticulosis    Dysuria    External hemorrhoid    Gastroparesis    Hypertension    Hypokalemia    Major depression    Microscopic hematuria    Nicotine addiction    Over weight    Rectal fistula    Schizophrenia (HCC)    Stroke [facility])     Past Surgical History:  Procedure Laterality Date   BACK SURGERY     COLONOSCOPY     COLONOSCOPY WITH PROPOFOL N/A 10/12/2014   Procedure: COLONOSCOPY WITH PROPOFOL;  Surgeon: Wallace Cullens, MD;  Location: [facility] ENDOSCOPY;  Service: Gastroenterology;  Laterality: N/A;   LUMBAR SPINE SURGERY     TONSILLECTOMY      Social History   Socioeconomic History   Marital status: Single    Spouse name: Not on file   Number of children: Not on file   Years of education: Not on file   Highest education level: Not on file  Occupational History   Not on file  Tobacco Use   Smoking status: Former    Current packs/day: 0.00    Types: Cigarettes    Quit date: 08/19/2014    Years since quitting: 8.3   Smokeless tobacco: Never  Substance and Sexual Activity   Alcohol use: No    Alcohol/week: 0.0 standard drinks of alcohol    Comment: last drink was in march 2016   Drug use: No   Sexual activity: Not on file  Other Topics Concern   Not on file  Social History Narrative    Not on file   Social Determinants of Health   Financial Resource Strain: Not on file  Food Insecurity: Not on file  Transportation Needs: Not on file  Physical Activity: Not on file  Stress: Not on file  Social Connections: Not on file  Intimate Partner Violence: Not on file    Family History  Problem Relation Age of Onset   Stroke Mother    Stroke Maternal Grandmother    Diabetes Mellitus II Sister     Allergies  Allergen Reactions   Diflucan [Fluconazole] Diarrhea   Ciprofloxacin Other (See Comments)    GI distress   Milk-Related Compounds Other (See Comments)    GI problems   Other     Other reaction(Mikka Kissner): Other (See Comments) GI problems   Pepto-Bismol [Bismuth Subsalicylate] Other (See Comments)    "Bad reaction"   Trazodone And Nefazodone Other (See Comments)    Burn tongue and causes blisters   Elemental Sulfur Other (See Comments)    GI distress     Review of Systems  Constitutional: Negative.   HENT: Negative.    Eyes: Negative.   Respiratory: Negative.    Cardiovascular: Negative.   Gastrointestinal: Negative.  Genitourinary: Negative.   Skin: Negative.   Neurological: Negative.   Endo/Heme/Allergies: Negative.   Psychiatric/Behavioral:  The patient is nervous/anxious.        Objective:   BP 116/85   Pulse (!) 55   Ht 5\' 7"  (1.702 m)   Wt 181 lb 6.4 oz (82.3 kg)   SpO2 98%   BMI 28.41 kg/m   Vitals:   12/08/22 1405  BP: 116/85  Pulse: (!) 55  Height: 5\' 7"  (1.702 m)  Weight: 181 lb 6.4 oz (82.3 kg)  SpO2: 98%  BMI (Calculated): 28.4    Physical Exam Vitals reviewed.  Constitutional:      Appearance: Normal appearance.  HENT:     Head: Normocephalic.     Left Ear: There is no impacted cerumen.     Nose: Nose normal.     Mouth/Throat:     Mouth: Mucous membranes are moist.     Pharynx: No posterior oropharyngeal erythema.  Eyes:     Extraocular Movements: Extraocular movements intact.     Pupils: Pupils are equal, round,  and reactive to light.  Cardiovascular:     Rate and Rhythm: Regular rhythm.     Chest Wall: PMI is not displaced.     Pulses: Normal pulses.     Heart sounds: Normal heart sounds. No murmur heard. Pulmonary:     Effort: Pulmonary effort is normal.     Breath sounds: Normal air entry. No rhonchi or rales.  Abdominal:     General: Abdomen is flat. Bowel sounds are normal. There is no distension.     Palpations: Abdomen is soft. There is no hepatomegaly, splenomegaly or mass.     Tenderness: There is no abdominal tenderness.  Musculoskeletal:        General: Normal range of motion.     Cervical back: Normal range of motion and neck supple.     Right lower leg: No edema.     Left lower leg: No edema.  Skin:    General: Skin is warm and dry.  Neurological:     General: No focal deficit present.     Mental Status: He is alert and oriented to person, place, and time.     Cranial Nerves: No cranial nerve deficit.     Motor: No weakness.  Psychiatric:        Mood and Affect: Mood normal.        Behavior: Behavior normal.      No results found for any visits on 12/08/22.  Recent Results (from the past 2160 hour(Abbie Berling))  CBC with Differential/Platelet     Status: None   Collection Time: 11/03/22  2:53 PM  Result Value Ref Range   WBC 5.0 3.4 - 10.8 x10E3/uL   RBC 5.19 4.14 - 5.80 x10E6/uL   Hemoglobin 15.3 13.0 - 17.7 g/dL   Hematocrit 16.1 09.6 - 51.0 %   MCV 88 79 - 97 fL   MCH 29.5 26.6 - 33.0 pg   MCHC 33.4 31.5 - 35.7 g/dL   RDW 04.5 40.9 - 81.1 %   Platelets 334 150 - 450 x10E3/uL   Neutrophils 55 Not Estab. %   Lymphs 29 Not Estab. %   Monocytes 11 Not Estab. %   Eos 4 Not Estab. %   Basos 1 Not Estab. %   Neutrophils Absolute 2.8 1.4 - 7.0 x10E3/uL   Lymphocytes Absolute 1.4 0.7 - 3.1 x10E3/uL   Monocytes Absolute 0.6 0.1 - 0.9 x10E3/uL  EOS (ABSOLUTE) 0.2 0.0 - 0.4 x10E3/uL   Basophils Absolute 0.1 0.0 - 0.2 x10E3/uL   Immature Granulocytes 0 Not Estab. %    Immature Grans (Abs) 0.0 0.0 - 0.1 x10E3/uL  Comprehensive metabolic panel     Status: Abnormal   Collection Time: 11/03/22  2:53 PM  Result Value Ref Range   Glucose 105 (H) 70 - 99 mg/dL   BUN 11 6 - 24 mg/dL   Creatinine, Ser 1.30 0.76 - 1.27 mg/dL   eGFR 98 >86 VH/QIO/9.62   BUN/Creatinine Ratio 12 9 - 20   Sodium 137 134 - 144 mmol/L   Potassium 3.7 3.5 - 5.2 mmol/L   Chloride 97 96 - 106 mmol/L   CO2 26 20 - 29 mmol/L   Calcium 10.0 8.7 - 10.2 mg/dL   Total Protein 6.9 6.0 - 8.5 g/dL   Albumin 4.7 3.8 - 4.9 g/dL   Globulin, Total 2.2 1.5 - 4.5 g/dL   Bilirubin Total 0.8 0.0 - 1.2 mg/dL   Alkaline Phosphatase 71 44 - 121 IU/L   AST 41 (H) 0 - 40 IU/L   ALT 117 (H) 0 - 44 IU/L  Lipid Panel w/o Chol/HDL Ratio     Status: None   Collection Time: 11/03/22  2:53 PM  Result Value Ref Range   Cholesterol, Total 121 100 - 199 mg/dL   Triglycerides 54 0 - 149 mg/dL   HDL 50 >95 mg/dL   VLDL Cholesterol Cal 12 5 - 40 mg/dL   LDL Chol Calc (NIH) 59 0 - 99 mg/dL  Testosterone,Free and Total     Status: Abnormal   Collection Time: 11/03/22  2:53 PM  Result Value Ref Range   Testosterone >1500 (H) 264 - 916 ng/dL    Comment: Adult male reference interval is based on a population of healthy nonobese males (BMI <30) between 59 and 29 years old. Travison, et.al. JCEM [id number]. PMID: 36644034.    Testosterone, Free 17.3 7.2 - 24.0 pg/mL  CK     Status: None   Collection Time: 11/03/22  2:53 PM  Result Value Ref Range   Total CK 212 41 - 331 U/L  Hepatitis C Antibody     Status: None   Collection Time: 11/14/22  2:59 PM  Result Value Ref Range   Hep C Virus Ab Non Reactive Non Reactive    Comment: HCV antibody alone does not differentiate between previously resolved infection and active infection. Equivocal and Reactive HCV antibody results should be followed up with an HCV RNA test to support the diagnosis of active HCV infection.   Hepatitis A antibody, IgM      Status: None   Collection Time: 11/14/22  2:59 PM  Result Value Ref Range   Hep A IgM Negative Negative  Specimen status report     Status: None   Collection Time: 11/14/22  2:59 PM  Result Value Ref Range   specimen status report Comment     Comment: Written Authorization Written Authorization Written Authorization Received. Authorization received from [facility] SIE ABMED MD 11-15-2022 Logged by Victoriano Lain   Hemoglobin A1c     Status: None   Collection Time: 12/04/22  2:47 PM  Result Value Ref Range   Hgb A1c MFr Bld 5.4 4.8 - 5.6 %    Comment:          Prediabetes: 5.7 - 6.4          Diabetes: >6.4  Glycemic control for adults with diabetes: <7.0    Est. average glucose Bld gHb Est-mCnc 108 mg/dL  TSH     Status: None   Collection Time: 12/04/22  2:47 PM  Result Value Ref Range   TSH 2.120 0.450 - 4.500 uIU/mL  CMP14+EGFR     Status: None   Collection Time: 12/04/22  2:47 PM  Result Value Ref Range   Glucose 95 70 - 99 mg/dL   BUN 9 6 - 24 mg/dL   Creatinine, Ser 4.78 0.76 - 1.27 mg/dL   eGFR 94 >29 FA/OZH/0.86   BUN/Creatinine Ratio 9 9 - 20   Sodium 141 134 - 144 mmol/L   Potassium 4.3 3.5 - 5.2 mmol/L   Chloride 102 96 - 106 mmol/L   CO2 25 20 - 29 mmol/L   Calcium 9.9 8.7 - 10.2 mg/dL   Total Protein 6.6 6.0 - 8.5 g/dL   Albumin 4.7 3.8 - 4.9 g/dL   Globulin, Total 1.9 1.5 - 4.5 g/dL   Bilirubin Total 0.7 0.0 - 1.2 mg/dL   Alkaline Phosphatase 53 44 - 121 IU/L   AST 25 0 - 40 IU/L   ALT 19 0 - 44 IU/L  Lipid panel     Status: Abnormal   Collection Time: 12/04/22  2:47 PM  Result Value Ref Range   Cholesterol, Total 173 100 - 199 mg/dL   Triglycerides 59 0 - 149 mg/dL   HDL 47 >57 mg/dL   VLDL Cholesterol Cal 12 5 - 40 mg/dL   LDL Chol Calc (NIH) 846 (H) 0 - 99 mg/dL   Chol/HDL Ratio 3.7 0.0 - 5.0 ratio    Comment:                                   T. Chol/HDL Ratio                                             Men  Women                                1/2 Avg.Risk  3.4    3.3                                   Avg.Risk  5.0    4.4                                2X Avg.Risk  9.6    7.1                                3X Avg.Risk 23.4   11.0   CBC With Differential     Status: Abnormal   Collection Time: 12/04/22  2:47 PM  Result Value Ref Range   WBC 3.3 (L) 3.4 - 10.8 x10E3/uL   RBC 4.74 4.14 - 5.80 x10E6/uL   Hemoglobin 14.6 13.0 - 17.7 g/dL   Hematocrit 96.2 95.2 - 51.0 %   MCV 91 79 - 97 fL   MCH 30.8 26.6 - 33.0 pg  MCHC 33.8 31.5 - 35.7 g/dL   RDW 82.9 56.2 - 13.0 %   Neutrophils 42 Not Estab. %   Lymphs 40 Not Estab. %   Monocytes 13 Not Estab. %   Eos 4 Not Estab. %   Basos 1 Not Estab. %   Neutrophils Absolute 1.4 1.4 - 7.0 x10E3/uL   Lymphocytes Absolute 1.3 0.7 - 3.1 x10E3/uL   Monocytes Absolute 0.4 0.1 - 0.9 x10E3/uL   EOS (ABSOLUTE) 0.1 0.0 - 0.4 x10E3/uL   Basophils Absolute 0.0 0.0 - 0.2 x10E3/uL   Immature Granulocytes 0 Not Estab. %   Immature Grans (Abs) 0.0 0.0 - 0.1 x10E3/uL    Comment: **Effective December 18, 2022, profile [id number] CBC/Differential**   (No Platelet) will be made non-orderable. [facility] Offers:   [id number] CBC With Differential/Platelet       Assessment & Plan:  As per problem list.  Problem List Items Addressed This Visit       Cardiovascular and Mediastinum   Hypertension   Relevant Medications   Bempedoic Acid (NEXLETOL) 180 MG TABS   Other Relevant Orders   CBC With Diff/Platelet     Endocrine   Testicular hypofunction   Relevant Orders   Testosterone Free with SHBG     Nervous and Auditory   Hearing loss of right ear   Relevant Orders   Ambulatory referral to ENT     Other   Mixed hyperlipidemia   Relevant Medications   Bempedoic Acid (NEXLETOL) 180 MG TABS   Other Relevant Orders   Comprehensive metabolic panel   Lipid panel   Other Visit Diagnoses     Situational anxiety    -  Primary   Relevant Medications   escitalopram (LEXAPRO) 10 MG tablet        Return in about 3 months (around 03/10/2023) for fu with labs prior.   Total time spent: 20 minutes  Luna Fuse, MD  12/08/2022   This document may have been prepared by [facility] Voice Recognition software and as such may include unintentional dictation errors.

## 2022-12-12 ENCOUNTER — Other Ambulatory Visit: Payer: Self-pay | Admitting: Student

## 2022-12-12 DIAGNOSIS — R42 Dizziness and giddiness: Secondary | ICD-10-CM | POA: Diagnosis not present

## 2022-12-12 DIAGNOSIS — H90A31 Mixed conductive and sensorineural hearing loss, unilateral, right ear with restricted hearing on the contralateral side: Secondary | ICD-10-CM | POA: Diagnosis not present

## 2022-12-12 DIAGNOSIS — H93A2 Pulsatile tinnitus, left ear: Secondary | ICD-10-CM | POA: Diagnosis not present

## 2022-12-13 ENCOUNTER — Other Ambulatory Visit: Payer: Self-pay | Admitting: Student

## 2022-12-13 DIAGNOSIS — H93A2 Pulsatile tinnitus, left ear: Secondary | ICD-10-CM

## 2022-12-15 ENCOUNTER — Other Ambulatory Visit: Payer: Self-pay | Admitting: Internal Medicine

## 2022-12-15 DIAGNOSIS — F319 Bipolar disorder, unspecified: Secondary | ICD-10-CM

## 2022-12-22 ENCOUNTER — Other Ambulatory Visit: Payer: Self-pay | Admitting: Nurse Practitioner

## 2022-12-22 DIAGNOSIS — I10 Essential (primary) hypertension: Secondary | ICD-10-CM

## 2023-01-11 ENCOUNTER — Other Ambulatory Visit: Payer: 59

## 2023-01-11 ENCOUNTER — Telehealth: Payer: Self-pay

## 2023-01-11 DIAGNOSIS — I10 Essential (primary) hypertension: Secondary | ICD-10-CM | POA: Diagnosis not present

## 2023-01-11 DIAGNOSIS — E782 Mixed hyperlipidemia: Secondary | ICD-10-CM

## 2023-01-11 DIAGNOSIS — E538 Deficiency of other specified B group vitamins: Secondary | ICD-10-CM | POA: Diagnosis not present

## 2023-01-11 DIAGNOSIS — E291 Testicular hypofunction: Secondary | ICD-10-CM

## 2023-01-11 NOTE — Telephone Encounter (Signed)
Patient was in here for labs and states that his insurance didn't approve his nexletol, on the denial it states he needs to try and fail zetia first

## 2023-01-19 ENCOUNTER — Ambulatory Visit (INDEPENDENT_AMBULATORY_CARE_PROVIDER_SITE_OTHER): Payer: 59 | Admitting: Internal Medicine

## 2023-01-19 VITALS — BP 130/70 | HR 55 | Ht 67.0 in | Wt 167.0 lb

## 2023-01-19 DIAGNOSIS — F411 Generalized anxiety disorder: Secondary | ICD-10-CM

## 2023-01-19 DIAGNOSIS — I10 Essential (primary) hypertension: Secondary | ICD-10-CM

## 2023-01-19 DIAGNOSIS — S39012A Strain of muscle, fascia and tendon of lower back, initial encounter: Secondary | ICD-10-CM

## 2023-01-19 DIAGNOSIS — F319 Bipolar disorder, unspecified: Secondary | ICD-10-CM | POA: Diagnosis not present

## 2023-01-19 DIAGNOSIS — K760 Fatty (change of) liver, not elsewhere classified: Secondary | ICD-10-CM

## 2023-01-19 MED ORDER — CYCLOBENZAPRINE HCL 10 MG PO TABS
10.0000 mg | ORAL_TABLET | Freq: Three times a day (TID) | ORAL | 0 refills | Status: DC | PRN
Start: 2023-01-19 — End: 2023-02-05

## 2023-01-19 MED ORDER — BUSPIRONE HCL 7.5 MG PO TABS
7.5000 mg | ORAL_TABLET | Freq: Two times a day (BID) | ORAL | 0 refills | Status: DC
Start: 2023-01-19 — End: 2023-02-23
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
## 2023-01-22 LAB — COMPREHENSIVE METABOLIC PANEL
ALT: 20 IU/L (ref 0–44)
AST: 43 IU/L — ABNORMAL HIGH (ref 0–40)
Albumin: 4.8 g/dL (ref 3.8–4.9)
Alkaline Phosphatase: 50 IU/L (ref 44–121)
BUN/Creatinine Ratio: 12 (ref 9–20)
BUN: 12 mg/dL (ref 6–24)
Bilirubin Total: 0.8 mg/dL (ref 0.0–1.2)
CO2: 27 mmol/L (ref 20–29)
Calcium: 10.5 mg/dL — ABNORMAL HIGH (ref 8.7–10.2)
Chloride: 92 mmol/L — ABNORMAL LOW (ref 96–106)
Creatinine, Ser: 0.99 mg/dL (ref 0.76–1.27)
Globulin, Total: 2.3 g/dL (ref 1.5–4.5)
Glucose: 95 mg/dL (ref 70–99)
Potassium: 4.4 mmol/L (ref 3.5–5.2)
Sodium: 134 mmol/L (ref 134–144)
Total Protein: 7.1 g/dL (ref 6.0–8.5)
eGFR: 89 mL/min/{1.73_m2} (ref >59)

## 2023-01-22 LAB — LIPID PANEL
Chol/HDL Ratio: 3.2 ratio (ref 0.0–5.0)
Cholesterol, Total: 164 mg/dL (ref 100–199)
HDL: 52 mg/dL (ref >39)
LDL Chol Calc (NIH): 97 mg/dL (ref 0–99)
Triglycerides: 77 mg/dL (ref 0–149)
VLDL Cholesterol Cal: 15 mg/dL (ref 5–40)

## 2023-01-22 LAB — TESTOSTERONE, FREE AND TOTAL (INCLUDES SHBG)-(MALES)
% Free Testosterone: 0.9 %
Free Testosterone, S: 73 pg/mL
Sex Hormone Binding Globulin: 109 nmol/L — ABNORMAL HIGH
Testosterone, Serum (Total): 808 ng/dL

## 2023-01-22 LAB — CBC WITH DIFF/PLATELET
Basophils Absolute: 0.1 10*3/uL (ref 0.0–0.2)
Basos: 2 %
EOS (ABSOLUTE): 0.3 10*3/uL (ref 0.0–0.4)
Eos: 7 %
Hematocrit: 42.8 % (ref 37.5–51.0)
Hemoglobin: 14.8 g/dL (ref 13.0–17.7)
Immature Grans (Abs): 0 10*3/uL (ref 0.0–0.1)
Immature Granulocytes: 0 %
Lymphocytes Absolute: 1.5 10*3/uL (ref 0.7–3.1)
Lymphs: 38 %
MCH: 30.6 pg (ref 26.6–33.0)
MCHC: 34.6 g/dL (ref 31.5–35.7)
MCV: 89 fL (ref 79–97)
Monocytes Absolute: 0.4 10*3/uL (ref 0.1–0.9)
Monocytes: 10 %
Neutrophils Absolute: 1.7 10*3/uL (ref 1.4–7.0)
Neutrophils: 43 %
Platelets: 327 10*3/uL (ref 150–450)
RBC: 4.83 x10E6/uL (ref 4.14–5.80)
RDW: 12.3 % (ref 11.6–15.4)
WBC: 3.9 10*3/uL (ref 3.4–10.8)

## 2023-01-22 LAB — VITAMIN B12: Vitamin B-12: 500 pg/mL (ref 232–1245)

## 2023-01-26 ENCOUNTER — Other Ambulatory Visit: Payer: Self-pay | Admitting: Internal Medicine

## 2023-01-26 NOTE — Progress Notes (Signed)
Patient Notified via MyChart Messgae

## 2023-01-27 ENCOUNTER — Other Ambulatory Visit: Payer: Self-pay | Admitting: Internal Medicine

## 2023-01-27 DIAGNOSIS — I10 Essential (primary) hypertension: Secondary | ICD-10-CM

## 2023-02-05 ENCOUNTER — Other Ambulatory Visit: Payer: Self-pay | Admitting: Internal Medicine

## 2023-02-05 DIAGNOSIS — S39012A Strain of muscle, fascia and tendon of lower back, initial encounter: Secondary | ICD-10-CM

## 2023-02-20 ENCOUNTER — Ambulatory Visit (HOSPITAL_COMMUNITY)
Admission: EM | Admit: 2023-02-20 | Discharge: 2023-02-20 | Disposition: A | Discharge status: Home / Self Care | Payer: 59

## 2023-02-20 NOTE — ED Notes (Signed)
Jeremiah [name] is a 57 year old male who presents to [facility] voluntarily and unaccompanied. Unable to assess pt further due to pt leaving prior to completing triage. Pt stated that he did not want to participate because he was concerned about getting a bill.Pt refused to sign a waiver and he left without being seen. Pt also refused vital signs.

## 2023-02-20 NOTE — Progress Notes (Signed)
   02/20/23 1559  [facility] Triage Screening (Walk-ins at [facility] only)  How Did You Hear About [place]? Self  What Is the Reason for Your Visit/Call Today? Jeremiah [name] is a 57 year old male who presents to [facility] voluntarily and unaccompanied. Unable to assess pt further due to pt leaving prior to completing triage. Pt stated that he did not want to participate because he was concerned about getting a bill.Pt refused to sign a waiver and he left without being seen.  How Long Has This Been Causing You Problems?  (UTA)  Have You Recently Had Any Thoughts About Hurting Yourself?  (UTA)  Are You Planning to Commit Suicide/Harm Yourself At This time?  (UTA)  Have you Recently Had Thoughts About Hurting Someone Else?  (UTA)  Are You Planning To Harm Someone At This Time?  (UTA)  Are you currently experiencing any auditory, visual or other hallucinations?  (UTA)  Have You Used Any Alcohol or Drugs in the Past 24 Hours?  (UTA)  Do you have any current medical co-morbidities that require immediate attention?  (UTA)  What Do You Feel Would Help You the Most Today?  (UTA)  If access to [facility] was not available, would you have sought care in the Emergency Department?  (UTA)  Determination of Need  (UTA)  Options For Referral  (UTA)

## 2023-02-20 NOTE — ED Notes (Signed)
Patient left ama.

## 2023-02-23 ENCOUNTER — Other Ambulatory Visit: Payer: Self-pay | Admitting: Internal Medicine

## 2023-02-23 DIAGNOSIS — F319 Bipolar disorder, unspecified: Secondary | ICD-10-CM

## 2023-02-24 ENCOUNTER — Other Ambulatory Visit: Payer: Self-pay | Admitting: Internal Medicine

## 2023-02-24 DIAGNOSIS — F411 Generalized anxiety disorder: Secondary | ICD-10-CM

## 2023-02-28 ENCOUNTER — Other Ambulatory Visit: Payer: Self-pay | Admitting: Internal Medicine

## 2023-02-28 DIAGNOSIS — F418 Other specified anxiety disorders: Secondary | ICD-10-CM

## 2023-03-09 ENCOUNTER — Other Ambulatory Visit: Payer: Self-pay | Admitting: Internal Medicine

## 2023-03-09 DIAGNOSIS — S39012A Strain of muscle, fascia and tendon of lower back, initial encounter: Secondary | ICD-10-CM

## 2023-04-17 ENCOUNTER — Other Ambulatory Visit: Payer: Self-pay | Admitting: Internal Medicine

## 2023-04-26 DIAGNOSIS — M47896 Other spondylosis, lumbar region: Secondary | ICD-10-CM | POA: Diagnosis not present

## 2023-04-27 ENCOUNTER — Other Ambulatory Visit: Payer: Self-pay | Admitting: Internal Medicine

## 2023-04-27 ENCOUNTER — Ambulatory Visit: Payer: 59 | Admitting: Internal Medicine

## 2023-04-27 DIAGNOSIS — I10 Essential (primary) hypertension: Secondary | ICD-10-CM

## 2023-05-03 ENCOUNTER — Other Ambulatory Visit: Payer: Self-pay | Admitting: Internal Medicine

## 2023-05-03 DIAGNOSIS — F319 Bipolar disorder, unspecified: Secondary | ICD-10-CM

## 2023-05-24 DIAGNOSIS — M47896 Other spondylosis, lumbar region: Secondary | ICD-10-CM | POA: Diagnosis not present

## 2023-05-29 ENCOUNTER — Other Ambulatory Visit: Payer: Self-pay | Admitting: Internal Medicine

## 2023-05-29 DIAGNOSIS — I10 Essential (primary) hypertension: Secondary | ICD-10-CM

## 2023-06-01 ENCOUNTER — Other Ambulatory Visit: Payer: Self-pay | Admitting: Internal Medicine

## 2023-06-01 DIAGNOSIS — F411 Generalized anxiety disorder: Secondary | ICD-10-CM

## 2023-06-01 DIAGNOSIS — I10 Essential (primary) hypertension: Secondary | ICD-10-CM

## 2023-06-01 DIAGNOSIS — F418 Other specified anxiety disorders: Secondary | ICD-10-CM

## 2023-06-12 ENCOUNTER — Ambulatory Visit: Payer: 59 | Admitting: Internal Medicine

## 2023-06-14 ENCOUNTER — Telehealth: Payer: Self-pay | Admitting: Internal Medicine

## 2023-06-14 NOTE — Telephone Encounter (Signed)
Patient left VM requesting to have his testosterone levels checked. Would like a call back when orders are in so he have the labs drawn.

## 2023-06-15 ENCOUNTER — Other Ambulatory Visit: Payer: Self-pay | Admitting: Internal Medicine

## 2023-06-15 DIAGNOSIS — E291 Testicular hypofunction: Secondary | ICD-10-CM

## 2023-06-18 ENCOUNTER — Ambulatory Visit: Payer: 59 | Admitting: Internal Medicine

## 2023-06-28 ENCOUNTER — Other Ambulatory Visit: Payer: Self-pay | Admitting: Internal Medicine

## 2023-06-28 DIAGNOSIS — F411 Generalized anxiety disorder: Secondary | ICD-10-CM

## 2023-06-28 DIAGNOSIS — I10 Essential (primary) hypertension: Secondary | ICD-10-CM

## 2023-07-19 ENCOUNTER — Other Ambulatory Visit: Payer: Self-pay | Admitting: Internal Medicine

## 2023-07-19 DIAGNOSIS — I10 Essential (primary) hypertension: Secondary | ICD-10-CM

## 2023-07-25 ENCOUNTER — Other Ambulatory Visit: Payer: Self-pay | Admitting: Internal Medicine

## 2023-07-25 ENCOUNTER — Other Ambulatory Visit

## 2023-07-25 DIAGNOSIS — K760 Fatty (change of) liver, not elsewhere classified: Secondary | ICD-10-CM

## 2023-07-25 DIAGNOSIS — F411 Generalized anxiety disorder: Secondary | ICD-10-CM

## 2023-07-25 DIAGNOSIS — E291 Testicular hypofunction: Secondary | ICD-10-CM

## 2023-07-26 ENCOUNTER — Other Ambulatory Visit: Payer: Self-pay | Admitting: Internal Medicine

## 2023-07-26 DIAGNOSIS — I10 Essential (primary) hypertension: Secondary | ICD-10-CM

## 2023-07-31 ENCOUNTER — Encounter: Payer: Self-pay | Admitting: Internal Medicine

## 2023-07-31 ENCOUNTER — Ambulatory Visit (INDEPENDENT_AMBULATORY_CARE_PROVIDER_SITE_OTHER): Admitting: Internal Medicine

## 2023-07-31 VITALS — BP 118/92 | HR 71 | Ht 67.0 in | Wt 170.0 lb

## 2023-07-31 DIAGNOSIS — E291 Testicular hypofunction: Secondary | ICD-10-CM

## 2023-07-31 DIAGNOSIS — K760 Fatty (change of) liver, not elsewhere classified: Secondary | ICD-10-CM | POA: Diagnosis not present

## 2023-07-31 DIAGNOSIS — F411 Generalized anxiety disorder: Secondary | ICD-10-CM

## 2023-07-31 DIAGNOSIS — I10 Essential (primary) hypertension: Secondary | ICD-10-CM | POA: Diagnosis not present

## 2023-07-31 DIAGNOSIS — B354 Tinea corporis: Secondary | ICD-10-CM

## 2023-07-31 DIAGNOSIS — B351 Tinea unguium: Secondary | ICD-10-CM | POA: Diagnosis not present

## 2023-07-31 MED ORDER — TEMAZEPAM 15 MG PO CAPS: 15.0000 mg | ORAL_CAPSULE | Freq: Every day | ORAL | 2 refills | Status: DC

## 2023-07-31 MED ORDER — TERBINAFINE HCL 250 MG PO TABS: 250.0000 mg | ORAL_TABLET | Freq: Every day | ORAL | 0 refills | Status: DC

## 2023-07-31 MED ORDER — CLOTRIMAZOLE-BETAMETHASONE 1-0.05 % EX CREA: 1.0000 | TOPICAL_CREAM | Freq: Two times a day (BID) | CUTANEOUS | 0 refills | Status: DC

## 2023-07-31 MED ORDER — AMLODIPINE BESYLATE 10 MG PO TABS: 10.0000 mg | ORAL_TABLET | Freq: Every day | ORAL | 0 refills | Status: DC

## 2023-07-31 NOTE — Progress Notes (Signed)
 Established Patient Office Visit  Subjective:  Patient ID: Jeremiah [name], male    DOB: 26-Jan-1966  Age: 58 y.o. MRN: [id number]  No chief complaint on file.   C/o rash on both legs x 2 wks mildly pruritic. Also here for lab review and medication refills. Labs reviewed and notable for improvement in well controlled lipids. Total testosterone wnl and cmp unremarkable.     No other concerns at this time.   Past Medical History:  Diagnosis Date   Anxiety    Benign essential HTN 08/27/2014   Borderline personality disorder (HCC)    BP (high blood pressure) 01/16/2015   Difficulty urinating    Diverticulosis    Dysuria    External hemorrhoid    Gastroparesis    Hypertension    Hypokalemia    Major depression    Microscopic hematuria    Nicotine addiction    Over weight    Rectal fistula    Schizophrenia (HCC)    Stroke [facility])     Past Surgical History:  Procedure Laterality Date   BACK SURGERY     COLONOSCOPY     COLONOSCOPY WITH PROPOFOL N/A 10/12/2014   Procedure: COLONOSCOPY WITH PROPOFOL;  Surgeon: Wallace Cullens, MD;  Location: [facility] ENDOSCOPY;  Service: Gastroenterology;  Laterality: N/A;   LUMBAR SPINE SURGERY     TONSILLECTOMY      Social History   Socioeconomic History   Marital status: Single    Spouse name: Not on file   Number of children: Not on file   Years of education: Not on file   Highest education level: Not on file  Occupational History   Not on file  Tobacco Use   Smoking status: Former    Current packs/day: 0.00    Types: Cigarettes    Quit date: 08/19/2014    Years since quitting: 8.9   Smokeless tobacco: Never  Substance and Sexual Activity   Alcohol use: No    Alcohol/week: 0.0 standard drinks of alcohol    Comment: last drink was in march 2016   Drug use: No   Sexual activity: Not on file  Other Topics Concern   Not on file  Social History Narrative   Not on file   Social Drivers of Health   Financial Resource Strain: Not on file   Food Insecurity: Not on file  Transportation Needs: Not on file  Physical Activity: Not on file  Stress: Not on file  Social Connections: Not on file  Intimate Partner Violence: Not on file    Family History  Problem Relation Age of Onset   Stroke Mother    Stroke Maternal Grandmother    Diabetes Mellitus II Sister     Allergies  Allergen Reactions   Diflucan [Fluconazole] Diarrhea   Ciprofloxacin Other (See Comments)    GI distress   Milk-Related Compounds Other (See Comments)    GI problems   Other     Other reaction(Xaria Judon): Other (See Comments) GI problems   Pepto-Bismol [Bismuth Subsalicylate] Other (See Comments)    "Bad reaction"   Trazodone And Nefazodone Other (See Comments)    Burn tongue and causes blisters   Elemental Sulfur Other (See Comments)    GI distress     Outpatient Medications Prior to Visit  Medication Sig   Arginine (L-ARGININE DOUBLE STRENGTH) 1000 MG TABS Take 5 tablets by mouth.   atorvastatin (LIPITOR) 40 MG tablet Take 40 mg by mouth at bedtime.   bisacodyl (DULCOLAX)  5 MG EC tablet Take 5 mg by mouth daily as needed for mild constipation or moderate constipation (constipation).    butalbital-acetaminophen-caffeine (FIORICET) 50-325-40 MG tablet Take by mouth.   carvedilol (COREG) 25 MG tablet TAKE TWO TABLETS BY MOUTH TWO TIMES A DAY   chlorthalidone (HYGROTON) 25 MG tablet TAKE ONE TABLET BY MOUTH EVERY MORNING   Citrulline (PURE L-CITRULLINE) 600 MG CAPS Take 5 capsules by mouth.   cyanocobalamin (,VITAMIN B-12,) 1000 MCG/ML injection Inject 1 mL (1,000 mcg total) into the muscle daily.   cyclobenzaprine (FLEXERIL) 10 MG tablet TAKE ONE TABLET (10 MG TOTAL) BY MOUTH THREE TIMES DAILY AS NEEDED FOR UP TO 20 DAYS FOR MUSCLE SPASMS.   ergocalciferol (VITAMIN D2) 1.25 MG (50000 UT) capsule Take by mouth.   escitalopram (LEXAPRO) 10 MG tablet TAKE ONE TABLET (10 MG TOTAL) BY MOUTH DAILY.   fluvoxaMINE (LUVOX) 100 MG tablet Take 1 tablet (100 mg  total) by mouth at bedtime.   galantamine (RAZADYNE ER) 24 MG 24 hr capsule Take 24 mg by mouth daily with breakfast.   hydrOXYzine (ATARAX/VISTARIL) 50 MG tablet Take 2 tablets (100 mg total) by mouth at bedtime as needed (sleep).   magnesium hydroxide (MILK OF MAGNESIA) 400 MG/5ML suspension Take 30 mLs by mouth daily as needed for mild constipation.   melatonin 3 MG TABS tablet Take by mouth.   Omega 3-6-9 Fatty Acids (OMEGA-3-6-9 PO) Take by mouth.   ondansetron (ZOFRAN) 4 MG tablet Take 1 tablet (4 mg total) by mouth every 8 (eight) hours as needed for nausea or vomiting.   polyethylene glycol (MIRALAX) packet Take 17 g by mouth daily.   tamsulosin (FLOMAX) 0.4 MG CAPS capsule Take 0.4 mg by mouth daily.   Testosterone 1.62 % GEL APPLY TWO PUMPS (40.5 MGS) TOPICALY EVERY MORNING   venlafaxine XR (EFFEXOR-XR) 150 MG 24 hr capsule Take 1 capsule (150 mg total) by mouth daily with breakfast.   [DISCONTINUED] amLODipine (NORVASC) 10 MG tablet TAKE ONE TABLET (10 MG TOTAL) BY MOUTH EVERY MORNING.   [DISCONTINUED] temazepam (RESTORIL) 15 MG capsule TAKE ONE CAPSULE BY MOUTH EVERY NIGHT AT BEDTIME   No facility-administered medications prior to visit.    Review of Systems  Constitutional: Negative.   HENT: Negative.    Eyes: Negative.   Respiratory: Negative.    Cardiovascular: Negative.   Gastrointestinal: Negative.   Genitourinary: Negative.   Neurological: Negative.   Endo/Heme/Allergies: Negative.   Psychiatric/Behavioral:  The patient is nervous/anxious.        Objective:   BP (!) 118/92   Pulse 71   Ht 5\' 7"  (1.702 m)   Wt 170 lb (77.1 kg)   SpO2 98%   BMI 26.63 kg/m   Vitals:   07/31/23 1553  BP: (!) 118/92  Pulse: 71  Height: 5\' 7"  (1.702 m)  Weight: 170 lb (77.1 kg)  SpO2: 98%  BMI (Calculated): 26.62    Physical Exam Vitals reviewed.  Constitutional:      Appearance: Normal appearance.  HENT:     Head: Normocephalic.     Left Ear: There is no impacted  cerumen.     Nose: Nose normal.     Mouth/Throat:     Mouth: Mucous membranes are moist.     Pharynx: No posterior oropharyngeal erythema.  Eyes:     Extraocular Movements: Extraocular movements intact.     Pupils: Pupils are equal, round, and reactive to light.  Cardiovascular:     Rate and Rhythm: Regular  rhythm.     Chest Wall: PMI is not displaced.     Pulses: Normal pulses.     Heart sounds: Normal heart sounds. No murmur heard. Pulmonary:     Effort: Pulmonary effort is normal.     Breath sounds: Normal air entry. No rhonchi or rales.  Abdominal:     General: Abdomen is flat. Bowel sounds are normal. There is no distension.     Palpations: Abdomen is soft. There is no hepatomegaly, splenomegaly or mass.     Tenderness: There is no abdominal tenderness.  Musculoskeletal:        General: Normal range of motion.     Cervical back: Normal range of motion and neck supple.     Lumbar back: Tenderness (left sacroiliac region) present.     Right lower leg: No edema.     Left lower leg: No edema.  Skin:    General: Skin is warm and dry.     Comments: Kissing lesion on both lower legs macular rash which are circular.  Neurological:     General: No focal deficit present.     Mental Status: He is alert and oriented to person, place, and time.     Cranial Nerves: No cranial nerve deficit.     Motor: No weakness.  Psychiatric:        Mood and Affect: Mood normal.        Behavior: Behavior normal.      No results found for any visits on 07/31/23.  No results found for this or any previous visit (from the past 2160 hours).    Assessment & Plan:   Problem List Items Addressed This Visit       Cardiovascular and Mediastinum   Hypertension   Relevant Medications   amLODipine (NORVASC) 10 MG tablet   Other Relevant Orders   Lipid panel   CBC With Diff/Platelet     Digestive   Fatty liver - Primary   Relevant Orders   Comprehensive metabolic panel     Other   GAD  (generalized anxiety disorder)   Relevant Medications   temazepam (RESTORIL) 15 MG capsule   Other Visit Diagnoses       Onychomycosis       Relevant Medications   terbinafine (LAMISIL) 250 MG tablet   clotrimazole-betamethasone (LOTRISONE) cream     Tinea corporis       Relevant Medications   terbinafine (LAMISIL) 250 MG tablet   clotrimazole-betamethasone (LOTRISONE) cream     Hypogonadism in male       Relevant Orders   Testosterone Free with SHBG       Return in about 3 months (around 10/31/2023) for fu with labs prior.   Total time spent: 20 minutes  Luna Fuse, MD  07/31/2023   This document may have been prepared by [facility] Voice Recognition software and as such may include unintentional dictation errors.

## 2023-08-02 LAB — COMPREHENSIVE METABOLIC PANEL WITH GFR
ALT: 19 IU/L (ref 0–44)
AST: 25 IU/L (ref 0–40)
Albumin: 4.4 g/dL (ref 3.8–4.9)
Alkaline Phosphatase: 69 IU/L (ref 44–121)
BUN/Creatinine Ratio: 13 (ref 9–20)
BUN: 11 mg/dL (ref 6–24)
Bilirubin Total: 0.7 mg/dL (ref 0.0–1.2)
CO2: 24 mmol/L (ref 20–29)
Calcium: 9.8 mg/dL (ref 8.7–10.2)
Chloride: 100 mmol/L (ref 96–106)
Creatinine, Ser: 0.85 mg/dL (ref 0.76–1.27)
Globulin, Total: 2 g/dL (ref 1.5–4.5)
Glucose: 88 mg/dL (ref 70–99)
Potassium: 3.9 mmol/L (ref 3.5–5.2)
Sodium: 136 mmol/L (ref 134–144)
Total Protein: 6.4 g/dL (ref 6.0–8.5)
eGFR: 102 mL/min/1.73 (ref >59)

## 2023-08-02 LAB — PSA: Prostate Specific Ag, Serum: 0.6 ng/mL (ref 0.0–4.0)

## 2023-08-02 LAB — TESTOSTERONE, BIOAVAILABLE (M)
Testost., % Free&Weakly Bound: 10.2 % (ref 9.0–46.0)
Testost., F&W Bound: 58.1 ng/dL (ref 40.0–250.0)
Testosterone: 570 ng/dL (ref 264–916)

## 2023-08-02 LAB — LIPID PANEL
Chol/HDL Ratio: 2.5 ratio (ref 0.0–5.0)
Cholesterol, Total: 135 mg/dL (ref 100–199)
HDL: 53 mg/dL (ref >39)
LDL Chol Calc (NIH): 69 mg/dL (ref 0–99)
Triglycerides: 63 mg/dL (ref 0–149)
VLDL Cholesterol Cal: 13 mg/dL (ref 5–40)

## 2023-08-02 LAB — TESTOSTERONE, FREE AND TOTAL (INCLUDES SHBG)-(MALES)
% Free Testosterone: 0.7 %
Free Testosterone, S: 41 pg/mL — ABNORMAL LOW
Sex Hormone Binding Globulin: 131 nmol/L — ABNORMAL HIGH
Testosterone, Serum (Total): 584 ng/dL

## 2023-08-06 ENCOUNTER — Encounter: Payer: Self-pay | Admitting: Internal Medicine

## 2023-08-06 NOTE — Progress Notes (Signed)
 Informed via Mychart message

## 2023-08-28 ENCOUNTER — Other Ambulatory Visit: Payer: Self-pay | Admitting: Internal Medicine

## 2023-08-28 DIAGNOSIS — F319 Bipolar disorder, unspecified: Secondary | ICD-10-CM

## 2023-09-05 ENCOUNTER — Other Ambulatory Visit: Payer: Self-pay | Admitting: Internal Medicine

## 2023-09-05 DIAGNOSIS — B354 Tinea corporis: Secondary | ICD-10-CM

## 2023-09-07 ENCOUNTER — Other Ambulatory Visit: Payer: Self-pay | Admitting: Internal Medicine

## 2023-09-07 DIAGNOSIS — B351 Tinea unguium: Secondary | ICD-10-CM

## 2023-09-11 ENCOUNTER — Telehealth: Payer: Self-pay

## 2023-09-11 NOTE — Telephone Encounter (Signed)
 Patient LM asking for a letter for Jury Duty bc he's under stress and anxiety

## 2023-09-12 ENCOUNTER — Other Ambulatory Visit: Payer: Self-pay | Admitting: Internal Medicine

## 2023-09-12 DIAGNOSIS — I10 Essential (primary) hypertension: Secondary | ICD-10-CM

## 2023-10-01 ENCOUNTER — Other Ambulatory Visit: Payer: Self-pay | Admitting: Internal Medicine

## 2023-10-01 DIAGNOSIS — F319 Bipolar disorder, unspecified: Secondary | ICD-10-CM

## 2023-10-03 DIAGNOSIS — E569 Vitamin deficiency, unspecified: Secondary | ICD-10-CM | POA: Diagnosis not present

## 2023-10-03 DIAGNOSIS — Z79899 Other long term (current) drug therapy: Secondary | ICD-10-CM | POA: Diagnosis not present

## 2023-10-03 DIAGNOSIS — R200 Anesthesia of skin: Secondary | ICD-10-CM | POA: Diagnosis not present

## 2023-10-20 ENCOUNTER — Other Ambulatory Visit: Payer: Self-pay | Admitting: Internal Medicine

## 2023-10-20 DIAGNOSIS — B351 Tinea unguium: Secondary | ICD-10-CM

## 2023-11-06 ENCOUNTER — Ambulatory Visit: Admitting: Internal Medicine

## 2023-11-12 ENCOUNTER — Other Ambulatory Visit: Payer: Self-pay | Admitting: Internal Medicine

## 2023-11-12 DIAGNOSIS — I10 Essential (primary) hypertension: Secondary | ICD-10-CM

## 2023-11-14 ENCOUNTER — Telehealth: Payer: Self-pay | Admitting: Internal Medicine

## 2023-11-14 NOTE — Telephone Encounter (Signed)
 Called to report that patient was in a very manic state and couldn't understand his speech. He was actively having hallucinations while she was there - stating that he said someone was banging on his windows when this actually wasn't happening. Carly felt unsafe in the situation and unfortunately had to finish the visit prematurely. Just FYI of the patient's current state.

## 2023-11-16 ENCOUNTER — Other Ambulatory Visit: Payer: Self-pay | Admitting: Internal Medicine

## 2023-11-16 DIAGNOSIS — B354 Tinea corporis: Secondary | ICD-10-CM

## 2023-11-27 DIAGNOSIS — R200 Anesthesia of skin: Secondary | ICD-10-CM | POA: Diagnosis not present

## 2023-11-27 DIAGNOSIS — E569 Vitamin deficiency, unspecified: Secondary | ICD-10-CM | POA: Diagnosis not present

## 2023-11-27 DIAGNOSIS — Z79899 Other long term (current) drug therapy: Secondary | ICD-10-CM | POA: Diagnosis not present

## 2023-12-04 ENCOUNTER — Other Ambulatory Visit

## 2023-12-04 DIAGNOSIS — K760 Fatty (change of) liver, not elsewhere classified: Secondary | ICD-10-CM | POA: Diagnosis not present

## 2023-12-04 DIAGNOSIS — I10 Essential (primary) hypertension: Secondary | ICD-10-CM | POA: Diagnosis not present

## 2023-12-05 ENCOUNTER — Other Ambulatory Visit: Payer: Self-pay | Admitting: Internal Medicine

## 2023-12-05 DIAGNOSIS — F418 Other specified anxiety disorders: Secondary | ICD-10-CM

## 2023-12-07 ENCOUNTER — Other Ambulatory Visit: Payer: Self-pay | Admitting: Internal Medicine

## 2023-12-07 DIAGNOSIS — I10 Essential (primary) hypertension: Secondary | ICD-10-CM

## 2023-12-07 DIAGNOSIS — F411 Generalized anxiety disorder: Secondary | ICD-10-CM

## 2023-12-10 ENCOUNTER — Other Ambulatory Visit: Payer: Self-pay | Admitting: Internal Medicine

## 2023-12-10 DIAGNOSIS — I10 Essential (primary) hypertension: Secondary | ICD-10-CM

## 2023-12-12 LAB — COMPREHENSIVE METABOLIC PANEL WITH GFR
ALT: 14 IU/L (ref 0–44)
AST: 14 IU/L (ref 0–40)
Albumin: 4.8 g/dL (ref 3.8–4.9)
Alkaline Phosphatase: 115 IU/L (ref 44–121)
BUN/Creatinine Ratio: 15 (ref 9–20)
BUN: 12 mg/dL (ref 6–24)
Bilirubin Total: 0.3 mg/dL (ref 0.0–1.2)
CO2: 19 mmol/L — ABNORMAL LOW (ref 20–29)
Calcium: 10.1 mg/dL (ref 8.7–10.2)
Chloride: 103 mmol/L (ref 96–106)
Creatinine, Ser: 0.82 mg/dL (ref 0.76–1.27)
Globulin, Total: 2.9 g/dL (ref 1.5–4.5)
Glucose: 96 mg/dL (ref 70–99)
Potassium: 4.2 mmol/L (ref 3.5–5.2)
Sodium: 142 mmol/L (ref 134–144)
Total Protein: 7.7 g/dL (ref 6.0–8.5)
eGFR: 102 mL/min/1.73 (ref >59)

## 2023-12-12 LAB — CBC WITH DIFF/PLATELET
Basophils Absolute: 0 x10E3/uL (ref 0.0–0.2)
Basos: 1 %
EOS (ABSOLUTE): 0.2 x10E3/uL (ref 0.0–0.4)
Eos: 4 %
Hematocrit: 40.2 % (ref 37.5–51.0)
Hemoglobin: 13.4 g/dL (ref 13.0–17.7)
Immature Grans (Abs): 0 x10E3/uL (ref 0.0–0.1)
Immature Granulocytes: 0 %
Lymphocytes Absolute: 1.1 x10E3/uL (ref 0.7–3.1)
Lymphs: 23 %
MCH: 30.9 pg (ref 26.6–33.0)
MCHC: 33.3 g/dL (ref 31.5–35.7)
MCV: 93 fL (ref 79–97)
Monocytes Absolute: 0.5 x10E3/uL (ref 0.1–0.9)
Monocytes: 10 %
Neutrophils Absolute: 2.9 x10E3/uL (ref 1.4–7.0)
Neutrophils: 62 %
Platelets: 278 x10E3/uL (ref 150–450)
RBC: 4.34 x10E6/uL (ref 4.14–5.80)
RDW: 11.9 % (ref 11.6–15.4)
WBC: 4.8 x10E3/uL (ref 3.4–10.8)

## 2023-12-12 LAB — LIPID PANEL
Chol/HDL Ratio: 3.2 ratio (ref 0.0–5.0)
Cholesterol, Total: 188 mg/dL (ref 100–199)
HDL: 59 mg/dL (ref >39)
LDL Chol Calc (NIH): 106 mg/dL — ABNORMAL HIGH (ref 0–99)
Triglycerides: 133 mg/dL (ref 0–149)
VLDL Cholesterol Cal: 23 mg/dL (ref 5–40)

## 2023-12-12 LAB — TESTOSTERONE, FREE AND TOTAL (INCLUDES SHBG)-(MALES)
% Free Testosterone: 0.9 %
Free Testosterone, S: 69 pg/mL
Sex Hormone Binding Globulin: 112 nmol/L — ABNORMAL HIGH
Testosterone, Serum (Total): 765 ng/dL

## 2023-12-13 ENCOUNTER — Other Ambulatory Visit: Payer: Self-pay | Admitting: Internal Medicine

## 2023-12-17 ENCOUNTER — Ambulatory Visit (INDEPENDENT_AMBULATORY_CARE_PROVIDER_SITE_OTHER): Admitting: Internal Medicine

## 2023-12-17 ENCOUNTER — Other Ambulatory Visit: Payer: Self-pay | Admitting: Internal Medicine

## 2023-12-17 ENCOUNTER — Ambulatory Visit: Payer: Self-pay | Admitting: Internal Medicine

## 2023-12-17 VITALS — BP 120/88 | HR 61 | Temp 98.2°F | Ht 67.0 in | Wt 177.4 lb

## 2023-12-17 DIAGNOSIS — B354 Tinea corporis: Secondary | ICD-10-CM

## 2023-12-17 DIAGNOSIS — E291 Testicular hypofunction: Secondary | ICD-10-CM | POA: Diagnosis not present

## 2023-12-17 DIAGNOSIS — I10 Essential (primary) hypertension: Secondary | ICD-10-CM | POA: Diagnosis not present

## 2023-12-17 DIAGNOSIS — F418 Other specified anxiety disorders: Secondary | ICD-10-CM

## 2023-12-17 DIAGNOSIS — F411 Generalized anxiety disorder: Secondary | ICD-10-CM

## 2023-12-17 MED ORDER — AMLODIPINE BESYLATE 10 MG PO TABS: 10.0000 mg | ORAL_TABLET | Freq: Every day | ORAL | 2 refills | Status: DC

## 2023-12-17 MED ORDER — TESTOSTERONE 1.62 % TD GEL
TRANSDERMAL | 2 refills | Status: DC
Start: 2023-12-17 — End: 2024-01-12

## 2023-12-17 MED ORDER — CARVEDILOL 25 MG PO TABS: 50.0000 mg | ORAL_TABLET | Freq: Two times a day (BID) | ORAL | 0 refills | Status: DC

## 2023-12-17 MED ORDER — TEMAZEPAM 15 MG PO CAPS: 15.0000 mg | ORAL_CAPSULE | Freq: Every day | ORAL | 2 refills | Status: DC

## 2023-12-17 MED ORDER — CYANOCOBALAMIN 1000 MCG/ML IJ SOLN: 1000.0000 ug | Freq: Every day | INTRAMUSCULAR | 0 refills | Status: DC

## 2023-12-17 MED ORDER — CHLORTHALIDONE 25 MG PO TABS: 25.0000 mg | ORAL_TABLET | Freq: Every morning | ORAL | 2 refills | Status: DC

## 2023-12-17 MED ORDER — ESCITALOPRAM OXALATE 10 MG PO TABS: 10.0000 mg | ORAL_TABLET | Freq: Every day | ORAL | 2 refills | Status: DC

## 2023-12-17 NOTE — Progress Notes (Signed)
 Established Patient Office Visit  Subjective:  Patient ID: Jeremiah [name], male    DOB: Aug 31, 1965  Age: 58 y.o. MRN: [id number]  Chief Complaint  Patient presents with   Follow-up    3 month lab results. Feels pressure in his right ear. Wondering about medication for stress.     No new complaints, here for lab review and medication refills. Labs reviewed and notable for deterioration in LDL, normal CBC, CMP and testosterone .     No other concerns at this time.   Past Medical History:  Diagnosis Date   Anxiety    Benign essential HTN 08/27/2014   Borderline personality disorder (HCC)    BP (high blood pressure) 01/16/2015   Difficulty urinating    Diverticulosis    Dysuria    External hemorrhoid    Gastroparesis    Hypertension    Hypokalemia    Major depression    Microscopic hematuria    Nicotine  addiction    Over weight    Rectal fistula    Schizophrenia (HCC)    Stroke [facility])     Past Surgical History:  Procedure Laterality Date   BACK SURGERY     COLONOSCOPY     COLONOSCOPY WITH PROPOFOL  N/A 10/12/2014   Procedure: COLONOSCOPY WITH PROPOFOL ;  Surgeon: Deward CINDERELLA Piedmont, MD;  Location: [facility] ENDOSCOPY;  Service: Gastroenterology;  Laterality: N/A;   LUMBAR SPINE SURGERY     TONSILLECTOMY      Social History   Socioeconomic History   Marital status: Single    Spouse name: Not on file   Number of children: Not on file   Years of education: Not on file   Highest education level: Not on file  Occupational History   Not on file  Tobacco Use   Smoking status: Former    Current packs/day: 0.00    Types: Cigarettes    Quit date: 08/19/2014    Years since quitting: 9.3   Smokeless tobacco: Never  Substance and Sexual Activity   Alcohol use: No    Alcohol/week: 0.0 standard drinks of alcohol    Comment: last drink was in march 2016   Drug use: No   Sexual activity: Not on file  Other Topics Concern   Not on file  Social History Narrative   Not on file    Social Drivers of Health   Financial Resource Strain: Not on file  Food Insecurity: Not on file  Transportation Needs: Not on file  Physical Activity: Not on file  Stress: Not on file  Social Connections: Not on file  Intimate Partner Violence: Not on file    Family History  Problem Relation Age of Onset   Stroke Mother    Stroke Maternal Grandmother    Diabetes Mellitus II Sister     Allergies  Allergen Reactions   Diflucan  [Fluconazole ] Diarrhea   Ciprofloxacin  Other (See Comments)    GI distress   Milk-Related Compounds Other (See Comments)    GI problems   Other     Other reaction(Hikeem Andersson): Other (See Comments) GI problems   Pepto-Bismol [Bismuth Subsalicylate] Other (See Comments)    Bad reaction   Trazodone  And Nefazodone Other (See Comments)    Burn tongue and causes blisters   Elemental Sulfur Other (See Comments)    GI distress     Outpatient Medications Prior to Visit  Medication Sig   busPIRone  (BUSPAR ) 7.5 MG tablet TAKE ONE TABLET (7.5 MG TOTAL) BY MOUTH TWO TIMES DAILY.  butalbital-acetaminophen -caffeine (FIORICET) 50-325-40 MG tablet Take by mouth.   clotrimazole -betamethasone  (LOTRISONE ) cream APPLY ONE APPLICATION TOPICALLY TWO (TWO) TIMES DAILY.   cyclobenzaprine  (FLEXERIL ) 10 MG tablet TAKE ONE TABLET (10 MG TOTAL) BY MOUTH THREE TIMES DAILY AS NEEDED FOR UP TO 20 DAYS FOR MUSCLE SPASMS.   ergocalciferol (VITAMIN D2) 1.25 MG (50000 UT) capsule Take by mouth.   JUBLIA 10 % SOLN APPLY TO AFFECTED TOENAIL DAILY, COVERING THE TOE, NAIL BED, AND SURROUNDING SKIN ETC.   [DISCONTINUED] amLODipine  (NORVASC ) 10 MG tablet TAKE ONE TABLET (10 MG TOTAL) BY MOUTH DAILY.   [DISCONTINUED] carvedilol  (COREG ) 25 MG tablet TAKE TWO TABLETS BY MOUTH TWO TIMES A DAY   [DISCONTINUED] chlorthalidone  (HYGROTON ) 25 MG tablet TAKE ONE TABLET BY MOUTH EVERY MORNING   [DISCONTINUED] cyanocobalamin  (,VITAMIN B-12,) 1000 MCG/ML injection Inject 1 mL (1,000 mcg total) into the  muscle daily.   [DISCONTINUED] escitalopram  (LEXAPRO ) 10 MG tablet TAKE ONE TABLET (10 MG TOTAL) BY MOUTH DAILY.   [DISCONTINUED] temazepam  (RESTORIL ) 15 MG capsule Take 1 capsule (15 mg total) by mouth at bedtime.   [DISCONTINUED] Testosterone  1.62 % GEL APPLY TWO PUMPS (40.5 MGS) TOPICALY EVERY MORNING   Arginine (L-ARGININE DOUBLE STRENGTH) 1000 MG TABS Take 5 tablets by mouth. (Patient not taking: Reported on 12/17/2023)   atorvastatin (LIPITOR) 40 MG tablet Take 40 mg by mouth at bedtime. (Patient not taking: Reported on 12/17/2023)   bisacodyl  (DULCOLAX) 5 MG EC tablet Take 5 mg by mouth daily as needed for mild constipation or moderate constipation (constipation).    Citrulline (PURE L-CITRULLINE) 600 MG CAPS Take 5 capsules by mouth. (Patient not taking: Reported on 12/17/2023)   fluvoxaMINE  (LUVOX ) 100 MG tablet Take 1 tablet (100 mg total) by mouth at bedtime. (Patient not taking: Reported on 12/17/2023)   galantamine (RAZADYNE ER) 24 MG 24 hr capsule Take 24 mg by mouth daily with breakfast. (Patient not taking: Reported on 12/17/2023)   hydrOXYzine  (ATARAX /VISTARIL ) 50 MG tablet Take 2 tablets (100 mg total) by mouth at bedtime as needed (sleep). (Patient not taking: Reported on 12/17/2023)   magnesium  hydroxide (MILK OF MAGNESIA) 400 MG/5ML suspension Take 30 mLs by mouth daily as needed for mild constipation. (Patient not taking: Reported on 12/17/2023)   melatonin 3 MG TABS tablet Take by mouth. (Patient not taking: Reported on 12/17/2023)   Omega 3-6-9 Fatty Acids (OMEGA-3-6-9 PO) Take by mouth. (Patient not taking: Reported on 12/17/2023)   ondansetron  (ZOFRAN ) 4 MG tablet Take 1 tablet (4 mg total) by mouth every 8 (eight) hours as needed for nausea or vomiting. (Patient not taking: Reported on 12/17/2023)   polyethylene glycol (MIRALAX ) packet Take 17 g by mouth daily. (Patient not taking: Reported on 12/17/2023)   tamsulosin (FLOMAX) 0.4 MG CAPS capsule Take 0.4 mg by mouth daily. (Patient  not taking: Reported on 12/17/2023)   venlafaxine  XR (EFFEXOR -XR) 150 MG 24 hr capsule Take 1 capsule (150 mg total) by mouth daily with breakfast. (Patient not taking: Reported on 12/17/2023)   [DISCONTINUED] terbinafine  (LAMISIL ) 250 MG tablet TAKE ONE TABLET (250 MG TOTAL) BY MOUTH DAILY. (Patient not taking: Reported on 12/17/2023)   No facility-administered medications prior to visit.    Review of Systems  Constitutional:  Positive for weight loss. Negative for fever.  HENT: Negative.    Eyes: Negative.   Respiratory: Negative.    Cardiovascular: Negative.   Gastrointestinal: Negative.   Genitourinary: Negative.   Skin: Negative.   Neurological: Negative.   Endo/Heme/Allergies: Negative.  Psychiatric/Behavioral:  The patient is nervous/anxious.        Objective:   BP 120/88   Pulse 61   Temp 98.2 F (36.8 C)   Ht 5' 7 (1.702 m)   Wt 177 lb 6.4 oz (80.5 kg)   SpO2 98%   BMI 27.78 kg/m   Vitals:   12/17/23 1547  BP: 120/88  Pulse: 61  Temp: 98.2 F (36.8 C)  Height: 5' 7 (1.702 m)  Weight: 177 lb 6.4 oz (80.5 kg)  SpO2: 98%  BMI (Calculated): 27.78    Physical Exam   No results found for any visits on 12/17/23.  Recent Results (from the past 2160 hours)  Comprehensive metabolic panel     Status: Abnormal   Collection Time: 12/04/23  3:42 PM  Result Value Ref Range   Glucose 96 70 - 99 mg/dL   BUN 12 6 - 24 mg/dL   Creatinine, Ser 9.17 0.76 - 1.27 mg/dL   eGFR 897 >40 fO/fpw/8.26   BUN/Creatinine Ratio 15 9 - 20   Sodium 142 134 - 144 mmol/L   Potassium 4.2 3.5 - 5.2 mmol/L   Chloride 103 96 - 106 mmol/L   CO2 19 (L) 20 - 29 mmol/L   Calcium 10.1 8.7 - 10.2 mg/dL   Total Protein 7.7 6.0 - 8.5 g/dL   Albumin 4.8 3.8 - 4.9 g/dL   Globulin, Total 2.9 1.5 - 4.5 g/dL   Bilirubin Total 0.3 0.0 - 1.2 mg/dL   Alkaline Phosphatase 115 44 - 121 IU/L   AST 14 0 - 40 IU/L   ALT 14 0 - 44 IU/L  Lipid panel     Status: Abnormal   Collection Time: 12/04/23   3:42 PM  Result Value Ref Range   Cholesterol, Total 188 100 - 199 mg/dL   Triglycerides 866 0 - 149 mg/dL   HDL 59 >60 mg/dL   VLDL Cholesterol Cal 23 5 - 40 mg/dL   LDL Chol Calc (NIH) 893 (H) 0 - 99 mg/dL   Chol/HDL Ratio 3.2 0.0 - 5.0 ratio    Comment:                                   T. Chol/HDL Ratio                                             Men  Women                               1/2 Avg.Risk  3.4    3.3                                   Avg.Risk  5.0    4.4                                2X Avg.Risk  9.6    7.1                                3X Avg.Risk 23.4   11.0  Testosterone  Free with SHBG     Status: Abnormal   Collection Time: 12/04/23  3:42 PM  Result Value Ref Range   Testosterone , Serum (Total) 765 ng/dL    Comment: This test was developed and its performance characteristics determined by [facility]. It has not been cleared or approved by the Food and Drug Administration.  Reference Range: Adult Males >18 years    43 - 916 This [facility] LC/MS-MS method is currently certified by the [facility] Hormone Standardization Program (HoST).  Adult male reference interval is based on a population of healthy nonobese males (BMI <30) between 9 and 73 years old. Gara birdie [place] [id number] PMID: [id number].    % Free Testosterone  0.9 %    Comment: This test was developed and its performance characteristics determined by [facility]. It has not been cleared or approved by the Food and Drug Administration. Reference Range: Adult Males: 1.5 - 3.2    Free Testosterone , Cyrstal Leitz 69 pg/mL    Comment: Reference Range: Adult Males: 35 - 280    Sex Hormone Binding Globulin 112.0 (H) nmol/L    Comment: Reference Range: >49y: 19.3 - 76.4   CBC With Diff/Platelet     Status: None   Collection Time: 12/04/23  3:42 PM  Result Value Ref Range   WBC 4.8 3.4 - 10.8 x10E3/uL   RBC 4.34 4.14 - 5.80 x10E6/uL   Hemoglobin 13.4 13.0 - 17.7 g/dL   Hematocrit 59.7 62.4 - 51.0 %   MCV  93 79 - 97 fL   MCH 30.9 26.6 - 33.0 pg   MCHC 33.3 31.5 - 35.7 g/dL   RDW 88.0 88.3 - 84.5 %   Platelets 278 150 - 450 x10E3/uL   Neutrophils 62 Not Estab. %   Lymphs 23 Not Estab. %   Monocytes 10 Not Estab. %   Eos 4 Not Estab. %   Basos 1 Not Estab. %   Neutrophils Absolute 2.9 1.4 - 7.0 x10E3/uL   Lymphocytes Absolute 1.1 0.7 - 3.1 x10E3/uL   Monocytes Absolute 0.5 0.1 - 0.9 x10E3/uL   EOS (ABSOLUTE) 0.2 0.0 - 0.4 x10E3/uL   Basophils Absolute 0.0 0.0 - 0.2 x10E3/uL   Immature Granulocytes 0 Not Estab. %   Immature Grans (Abs) 0.0 0.0 - 0.1 x10E3/uL      Assessment & Plan:  As per problem list  Problem List Items Addressed This Visit       Cardiovascular and Mediastinum   Hypertension   Relevant Medications   amLODipine  (NORVASC ) 10 MG tablet   carvedilol  (COREG ) 25 MG tablet   chlorthalidone  (HYGROTON ) 25 MG tablet     Endocrine   Testicular hypofunction - Primary   Relevant Medications   Testosterone  1.62 % GEL     Other   GAD (generalized anxiety disorder)   Relevant Medications   temazepam  (RESTORIL ) 15 MG capsule   escitalopram  (LEXAPRO ) 10 MG tablet   Other Visit Diagnoses       Tinea corporis         Situational anxiety       Relevant Medications   escitalopram  (LEXAPRO ) 10 MG tablet       Return in about 3 months (around 03/18/2024) for fu with labs prior.   Total time spent: 20 minutes  Sherrill Cinderella Perry, MD  12/17/2023   This document may have been prepared by [facility] Voice Recognition software and as such may include unintentional dictation errors.

## 2023-12-19 ENCOUNTER — Other Ambulatory Visit: Payer: Self-pay

## 2023-12-21 ENCOUNTER — Other Ambulatory Visit: Payer: Self-pay | Admitting: Internal Medicine

## 2023-12-21 DIAGNOSIS — B351 Tinea unguium: Secondary | ICD-10-CM

## 2024-01-05 ENCOUNTER — Other Ambulatory Visit: Payer: Self-pay | Admitting: Internal Medicine

## 2024-01-09 DIAGNOSIS — H2513 Age-related nuclear cataract, bilateral: Secondary | ICD-10-CM | POA: Diagnosis not present

## 2024-01-10 ENCOUNTER — Emergency Department
Admission: EM | Admit: 2024-01-10 | Discharge: 2024-01-11 | Disposition: A | Discharge status: Other Acute Inpatient Hospital | Source: Home / Self Care | Attending: Emergency Medicine | Admitting: Emergency Medicine

## 2024-01-10 ENCOUNTER — Other Ambulatory Visit: Payer: Self-pay

## 2024-01-10 DIAGNOSIS — E871 Hypo-osmolality and hyponatremia: Secondary | ICD-10-CM | POA: Insufficient documentation

## 2024-01-10 DIAGNOSIS — I10 Essential (primary) hypertension: Secondary | ICD-10-CM | POA: Diagnosis not present

## 2024-01-10 DIAGNOSIS — R0789 Other chest pain: Secondary | ICD-10-CM | POA: Insufficient documentation

## 2024-01-10 DIAGNOSIS — R45851 Suicidal ideations: Secondary | ICD-10-CM | POA: Insufficient documentation

## 2024-01-10 DIAGNOSIS — F250 Schizoaffective disorder, bipolar type: Secondary | ICD-10-CM | POA: Diagnosis not present

## 2024-01-10 DIAGNOSIS — R1010 Upper abdominal pain, unspecified: Secondary | ICD-10-CM | POA: Insufficient documentation

## 2024-01-10 DIAGNOSIS — Z79899 Other long term (current) drug therapy: Secondary | ICD-10-CM | POA: Insufficient documentation

## 2024-01-10 LAB — URINE DRUG SCREEN, QUALITATIVE (ARMC ONLY)
Amphetamines, Ur Screen: NOT DETECTED
Barbiturates, Ur Screen: POSITIVE — AB
Benzodiazepine, Ur Scrn: POSITIVE — AB
Cannabinoid 50 Ng, Ur ~~LOC~~: NOT DETECTED
Cocaine Metabolite,Ur ~~LOC~~: NOT DETECTED
MDMA (Ecstasy)Ur Screen: NOT DETECTED
Methadone Scn, Ur: NOT DETECTED
Opiate, Ur Screen: NOT DETECTED
Phencyclidine (PCP) Ur S: NOT DETECTED
Tricyclic, Ur Screen: NOT DETECTED

## 2024-01-10 LAB — BASIC METABOLIC PANEL WITH GFR
Anion gap: 14 (ref 5–15)
BUN: 9 mg/dL (ref 6–20)
CO2: 26 mmol/L (ref 22–32)
Calcium: 9.1 mg/dL (ref 8.9–10.3)
Chloride: 84 mmol/L — ABNORMAL LOW (ref 98–111)
Creatinine, Ser: 0.75 mg/dL (ref 0.61–1.24)
GFR, Estimated: >60 mL/min (ref >60)
Glucose, Bld: 115 mg/dL — ABNORMAL HIGH (ref 70–99)
Potassium: 2.7 mmol/L — CL (ref 3.5–5.1)
Sodium: 124 mmol/L — ABNORMAL LOW (ref 135–145)

## 2024-01-10 LAB — TROPONIN I (HIGH SENSITIVITY): Troponin I (High Sensitivity): 5 ng/L (ref <18)

## 2024-01-10 LAB — ETHANOL: Alcohol, Ethyl (B): <15 mg/dL (ref <15)

## 2024-01-10 LAB — CBC
Hemoglobin: 15.6 g/dL (ref 13.0–17.0)
Platelets: 351 K/uL (ref 150–400)
WBC: 7.1 K/uL (ref 4.0–10.5)

## 2024-01-10 LAB — COMPREHENSIVE METABOLIC PANEL WITH GFR
ALT: 21 U/L (ref 0–44)
AST: 35 U/L (ref 15–41)
Albumin: 5 g/dL (ref 3.5–5.0)
Alkaline Phosphatase: 65 U/L (ref 38–126)
Anion gap: 18 — ABNORMAL HIGH (ref 5–15)
BUN: 11 mg/dL (ref 6–20)
CO2: 26 mmol/L (ref 22–32)
Calcium: 10.6 mg/dL — ABNORMAL HIGH (ref 8.9–10.3)
Chloride: 78 mmol/L — ABNORMAL LOW (ref 98–111)
Creatinine, Ser: 0.54 mg/dL — ABNORMAL LOW (ref 0.61–1.24)
GFR, Estimated: >60 mL/min (ref >60)
Glucose, Bld: 125 mg/dL — ABNORMAL HIGH (ref 70–99)
Potassium: 2.9 mmol/L — ABNORMAL LOW (ref 3.5–5.1)
Sodium: 122 mmol/L — ABNORMAL LOW (ref 135–145)
Total Bilirubin: 1.3 mg/dL — ABNORMAL HIGH (ref 0.0–1.2)
Total Protein: 7.9 g/dL (ref 6.5–8.1)

## 2024-01-10 LAB — LIPASE, BLOOD: Lipase: 50 U/L (ref 11–51)

## 2024-01-10 MED ORDER — OLANZAPINE 10 MG PO TABS
10.0000 mg | ORAL_TABLET | Freq: Every day | ORAL | Status: DC
  Administered 2024-01-10: 10 mg via ORAL
  Filled 2024-01-10: qty 1

## 2024-01-10 MED ORDER — BISACODYL 5 MG PO TBEC: 5.0000 mg | DELAYED_RELEASE_TABLET | Freq: Every day | ORAL | Status: DC | PRN

## 2024-01-10 MED ORDER — MELATONIN 5 MG PO TABS
2.5000 mg | ORAL_TABLET | Freq: Every day | ORAL | Status: DC
  Administered 2024-01-10: 2.5 mg via ORAL
  Filled 2024-01-10: qty 1

## 2024-01-10 MED ORDER — CARVEDILOL 6.25 MG PO TABS
50.0000 mg | ORAL_TABLET | Freq: Two times a day (BID) | ORAL | Status: DC
  Administered 2024-01-11 (×2): 50 mg via ORAL
  Filled 2024-01-10 (×2): qty 8

## 2024-01-10 MED ORDER — CHLORTHALIDONE 25 MG PO TABS
25.0000 mg | ORAL_TABLET | Freq: Every morning | ORAL | Status: DC
  Administered 2024-01-11: 25 mg via ORAL
  Filled 2024-01-10: qty 1

## 2024-01-10 MED ORDER — BUSPIRONE HCL 15 MG PO TABS
7.5000 mg | ORAL_TABLET | Freq: Two times a day (BID) | ORAL | Status: DC
  Administered 2024-01-11 (×2): 7.5 mg via ORAL
  Filled 2024-01-10 (×2): qty 1

## 2024-01-10 MED ORDER — POTASSIUM CHLORIDE 20 MEQ PO PACK
40.0000 meq | PACK | Freq: Once | ORAL | Status: AC
  Administered 2024-01-10: 40 meq via ORAL
  Filled 2024-01-10: qty 2

## 2024-01-10 MED ORDER — AMLODIPINE BESYLATE 5 MG PO TABS
10.0000 mg | ORAL_TABLET | Freq: Every day | ORAL | Status: DC
  Administered 2024-01-11: 10 mg via ORAL
  Filled 2024-01-10: qty 2

## 2024-01-10 MED ORDER — ALUM & MAG HYDROXIDE-SIMETH 200-200-20 MG/5ML PO SUSP
30.0000 mL | Freq: Once | ORAL | Status: AC
  Administered 2024-01-10: 30 mL via ORAL
  Filled 2024-01-10: qty 30

## 2024-01-10 MED ORDER — SODIUM CHLORIDE 1 G PO TABS
1.0000 g | ORAL_TABLET | Freq: Once | ORAL | Status: AC
  Administered 2024-01-10: 1 g via ORAL
  Filled 2024-01-10: qty 1

## 2024-01-10 MED ORDER — SODIUM CHLORIDE 0.9 % IV BOLUS
1000.0000 mL | Freq: Once | INTRAVENOUS | Status: AC
  Administered 2024-01-10: 1000 mL via INTRAVENOUS

## 2024-01-10 MED ORDER — ATORVASTATIN CALCIUM 20 MG PO TABS
20.0000 mg | ORAL_TABLET | Freq: Every day | ORAL | Status: DC
  Administered 2024-01-10: 20 mg via ORAL
  Filled 2024-01-10: qty 1

## 2024-01-10 MED ORDER — POTASSIUM CHLORIDE CRYS ER 20 MEQ PO TBCR
40.0000 meq | EXTENDED_RELEASE_TABLET | Freq: Once | ORAL | Status: AC
  Administered 2024-01-10: 40 meq via ORAL
  Filled 2024-01-10: qty 2

## 2024-01-10 MED ORDER — LORAZEPAM 1 MG PO TABS
1.0000 mg | ORAL_TABLET | Freq: Once | ORAL | Status: AC
  Administered 2024-01-10: 1 mg via ORAL
  Filled 2024-01-10: qty 1

## 2024-01-10 NOTE — ED Notes (Signed)
 his RN introduced self to pt. Explained the rules of the unit and how it operates. Showed pt the bathroom and where he could get one of us  staff members if he needed something or had a question.   Pt given a snack and water.  Pt denies SI/HI. States he is just really anxious. PT is requesting more medication to help him sleep. This RN will reach out to ED provider.   Pt recommended for inpatient therapy.
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
## 2024-01-10 NOTE — ED Notes (Addendum)
 Pt dressed out by this RN and EDT:   1 blue and white shirt 1 pair khaki shorts 1 pair black shoes  1 hair tie 1 cell phone  1 black watch  1 black shoes  2 pill bottles  Set of keys

## 2024-01-10 NOTE — ED Provider Notes (Signed)
 [facility] Provider Note   Event Date/Time   First MD Initiated Contact with Patient 01/10/24 1759     (approximate) History  Anxiety and Psychiatric Evaluation  HPI Jeremiah [name] is a 58 y.o. male with a stated past medical history of schizoaffective disorder who presents complaining of increased anxiety as well as bilateral lower anterior chest and upper abdominal pain that has been present over the last 5 days and has been worsening.  Patient states he has had increased stress from a housing situation.  Patient states he has been off his medications for the last 3 years until he took a 150 mg Seroquel  yesterday.  Patient currently denies any SI, HI, or AVH ROS: Patient currently denies any vision changes, tinnitus, difficulty speaking, facial droop, sore throat, chest pain, shortness of breath, abdominal pain, nausea/vomiting/diarrhea, dysuria, or weakness/numbness/paresthesias in any extremity   Physical Exam  Triage Vital Signs: ED Triage Vitals  Encounter Vitals Group     BP 01/10/24 1739 (!) 163/100     Girls Systolic BP Percentile --      Girls Diastolic BP Percentile --      Boys Systolic BP Percentile --      Boys Diastolic BP Percentile --      Pulse Rate 01/10/24 1739 79     Resp 01/10/24 1739 18     Temp 01/10/24 1739 98.3 F (36.8 C)     Temp Source 01/10/24 1739 Oral     SpO2 01/10/24 1739 98 %     Weight --      Height --      Head Circumference --      Peak Flow --      Pain Score 01/10/24 1740 3     Pain Loc --      Pain Education --      Exclude from Growth Chart --    Most recent vital signs: Vitals:   01/10/24 1739 01/10/24 1821  BP: (!) 163/100   Pulse: 79   Resp: 18   Temp: 98.3 F (36.8 C)   SpO2: 98% 98%   General: Awake, oriented x4. CV:  Good peripheral perfusion. Resp:  Normal effort. Abd:  No distention. Other:  Middle-aged well-developed, well-nourished Caucasian male resting comfortably in no acute  distress ED Results / Procedures / Treatments  Labs (all labs ordered are listed, but only abnormal results are displayed) Labs Reviewed  COMPREHENSIVE METABOLIC PANEL WITH GFR - Abnormal; Notable for the following components:      Result Value   Sodium 122 (*)    Potassium 2.9 (*)    Chloride 78 (*)    Glucose, Bld 125 (*)    Creatinine, Ser 0.54 (*)    Calcium  10.6 (*)    Total Bilirubin 1.3 (*)    Anion gap 18 (*)    All other components within normal limits  URINE DRUG SCREEN, QUALITATIVE ([facility] ONLY) - Abnormal; Notable for the following components:   Barbiturates, Ur Screen POSITIVE (*)    Benzodiazepine, Ur Scrn POSITIVE (*)    All other components within normal limits  ETHANOL  CBC  LIPASE, BLOOD  BASIC METABOLIC PANEL WITH GFR  TROPONIN I (HIGH SENSITIVITY)   PROCEDURES: Critical Care performed: No Procedures MEDICATIONS ORDERED IN ED: Medications  OLANZapine  (ZYPREXA ) tablet 10 mg (10 mg Oral Given 01/10/24 2114)  sodium chloride  0.9 % bolus 1,000 mL (0 mLs Intravenous Stopped 01/10/24 1931)  potassium chloride  SA (KLOR-CON  M) CR  tablet 40 mEq (40 mEq Oral Given 01/10/24 2001)  alum & mag hydroxide-simeth (MAALOX/MYLANTA) 200-200-20 MG/5ML suspension 30 mL (30 mLs Oral Given 01/10/24 2021)  LORazepam  (ATIVAN ) tablet 1 mg (1 mg Oral Given 01/10/24 2114)   IMPRESSION / MDM / ASSESSMENT AND PLAN / ED COURSE  I reviewed the triage vital signs and the nursing notes.                             The patient is on the cardiac monitor to evaluate for evidence of arrhythmia and/or significant heart rate changes. Patient's presentation is most consistent with acute presentation with potential threat to life or bodily function. This patient presents with symptoms consistent with acute anxiety reaction panic attacks. Low suspicion for acute cardiopulmonary process including ACS, PE, or thoracic aortic dissection however patient does have lower chest pain and bilateral upper  abdominal quadrant pain so will draw troponin x 1 as well as lipase. Denies any ingestions or any other medical complaints. No evidence of alcohol withdrawal symptoms. Presentation not consistent with overt toxidrome or ingestion given history & physical. Presentation not consistent with organic or medical emergency at this time.  Plan: CBC, CMP, ethanol, lipase, troponin  Patient is pending repeat metabolic panel for medical clearance at this time. Care of this patient will be signed out to the oncoming physician at the end of my shift.  All pertinent patient information conveyed and all questions answered.  All further care and disposition decisions will be made by the oncoming physician. Clinical Course as of 01/10/24 2234  Thu Jan 10, 2024  1930 Laboratory evaluation significant for sodium of 122 and urinary drug screen positive for barbiturates and benzodiazepines.  Patient states that he has been out of his benzodiazepine prescription for the past few months and that he has not had any benzodiazepines over this time.  Patient given 1 L of normal saline and will repeat BMP after this administration [EB]  2026 Patient seen by psychiatrist, Dr. Camellia, who recommends starting Zyprexa  10 mg nightly and Ativan  once as well as inpatient psychiatric admission  Dispo: Pending placement [EB]    Clinical Course User Index [EB] Jossie Artist POUR, MD   FINAL CLINICAL IMPRESSION(S) / ED DIAGNOSES   Final diagnoses:  Suicidal ideation  Hyponatremia   Rx / DC Orders   ED Discharge Orders     None      Note:  This document was prepared using Dragon voice recognition software and may include unintentional dictation errors.   Jossie Artist POUR, MD 01/10/24 [phone]

## 2024-01-10 NOTE — ED Notes (Signed)
VOL  pending  consult 

## 2024-01-10 NOTE — ED Triage Notes (Addendum)
 Pt to ED via POV from home voluntarily. Pt reports has been having increased anxiety and has been stuck in a manic state. Pt reports has been off medications for a little bit. Pt reports currently being evicted from residents. No etoh or drug use. Pt denies SI/HI or AVH

## 2024-01-10 NOTE — ED Notes (Signed)
 Phones #s  Tarry Fountain (Ex-wife) [phone] Zubayr Bednarczyk (sister) [phone] Jerson Furukawa (son) [phone]

## 2024-01-11 ENCOUNTER — Encounter: Payer: Self-pay | Admitting: Child & Adolescent Psychiatry

## 2024-01-11 ENCOUNTER — Inpatient Hospital Stay
Admission: RE | Admit: 2024-01-11 | Discharge: 2024-01-16 | DRG: 885 | Disposition: A | Discharge status: Home / Self Care | Source: Intra-hospital | Attending: Psychiatry | Admitting: Psychiatry

## 2024-01-11 DIAGNOSIS — E871 Hypo-osmolality and hyponatremia: Secondary | ICD-10-CM | POA: Diagnosis present

## 2024-01-11 DIAGNOSIS — Z634 Disappearance and death of family member: Secondary | ICD-10-CM | POA: Diagnosis not present

## 2024-01-11 DIAGNOSIS — Z87891 Personal history of nicotine dependence: Secondary | ICD-10-CM

## 2024-01-11 DIAGNOSIS — Z608 Other problems related to social environment: Secondary | ICD-10-CM | POA: Diagnosis present

## 2024-01-11 DIAGNOSIS — I10 Essential (primary) hypertension: Secondary | ICD-10-CM | POA: Diagnosis present

## 2024-01-11 DIAGNOSIS — F419 Anxiety disorder, unspecified: Secondary | ICD-10-CM | POA: Diagnosis present

## 2024-01-11 DIAGNOSIS — Z91011 Allergy to milk products: Secondary | ICD-10-CM | POA: Diagnosis not present

## 2024-01-11 DIAGNOSIS — Z823 Family history of stroke: Secondary | ICD-10-CM

## 2024-01-11 DIAGNOSIS — F4310 Post-traumatic stress disorder, unspecified: Secondary | ICD-10-CM | POA: Diagnosis present

## 2024-01-11 DIAGNOSIS — F250 Schizoaffective disorder, bipolar type: Principal | ICD-10-CM | POA: Diagnosis present

## 2024-01-11 DIAGNOSIS — Z8673 Personal history of transient ischemic attack (TIA), and cerebral infarction without residual deficits: Secondary | ICD-10-CM

## 2024-01-11 DIAGNOSIS — R1010 Upper abdominal pain, unspecified: Secondary | ICD-10-CM | POA: Diagnosis present

## 2024-01-11 DIAGNOSIS — R45851 Suicidal ideations: Secondary | ICD-10-CM | POA: Diagnosis present

## 2024-01-11 DIAGNOSIS — F259 Schizoaffective disorder, unspecified: Secondary | ICD-10-CM | POA: Diagnosis not present

## 2024-01-11 DIAGNOSIS — Z833 Family history of diabetes mellitus: Secondary | ICD-10-CM | POA: Diagnosis not present

## 2024-01-11 DIAGNOSIS — Z79899 Other long term (current) drug therapy: Secondary | ICD-10-CM | POA: Diagnosis not present

## 2024-01-11 DIAGNOSIS — Z888 Allergy status to other drugs, medicaments and biological substances status: Secondary | ICD-10-CM | POA: Diagnosis not present

## 2024-01-11 DIAGNOSIS — Z91148 Patient's other noncompliance with medication regimen for other reason: Secondary | ICD-10-CM | POA: Diagnosis not present

## 2024-01-11 DIAGNOSIS — G479 Sleep disorder, unspecified: Secondary | ICD-10-CM | POA: Diagnosis present

## 2024-01-11 DIAGNOSIS — Z881 Allergy status to other antibiotic agents status: Secondary | ICD-10-CM | POA: Diagnosis not present

## 2024-01-11 LAB — BASIC METABOLIC PANEL WITH GFR
Anion gap: 10 (ref 5–15)
BUN: 16 mg/dL (ref 6–20)
CO2: 26 mmol/L (ref 22–32)
Calcium: 9.3 mg/dL (ref 8.9–10.3)
Chloride: 89 mmol/L — ABNORMAL LOW (ref 98–111)
Creatinine, Ser: 1.1 mg/dL (ref 0.61–1.24)
GFR, Estimated: >60 mL/min (ref >60)
Glucose, Bld: 120 mg/dL — ABNORMAL HIGH (ref 70–99)
Potassium: 3.2 mmol/L — ABNORMAL LOW (ref 3.5–5.1)
Sodium: 125 mmol/L — ABNORMAL LOW (ref 135–145)

## 2024-01-11 MED ORDER — HALOPERIDOL 5 MG PO TABS
5.0000 mg | ORAL_TABLET | Freq: Three times a day (TID) | ORAL | Status: DC | PRN
Start: 2024-01-11 — End: 2024-01-16

## 2024-01-11 MED ORDER — POTASSIUM CHLORIDE CRYS ER 20 MEQ PO TBCR
40.0000 meq | EXTENDED_RELEASE_TABLET | Freq: Once | ORAL | Status: AC
  Administered 2024-01-11: 40 meq via ORAL
  Filled 2024-01-11: qty 2

## 2024-01-11 MED ORDER — ALUM & MAG HYDROXIDE-SIMETH 200-200-20 MG/5ML PO SUSP
30.0000 mL | ORAL | Status: DC | PRN
  Administered 2024-01-12: 30 mL via ORAL
  Filled 2024-01-11: qty 30

## 2024-01-11 MED ORDER — MAGNESIUM HYDROXIDE 400 MG/5ML PO SUSP: 30.0000 mL | Freq: Every day | ORAL | Status: DC | PRN

## 2024-01-11 MED ORDER — OLANZAPINE 10 MG PO TBDP
10.0000 mg | ORAL_TABLET | Freq: Every day | ORAL | Status: DC
  Administered 2024-01-11: 10 mg via ORAL
  Filled 2024-01-11: qty 1

## 2024-01-11 MED ORDER — DIPHENHYDRAMINE HCL 25 MG PO CAPS: 50.0000 mg | ORAL_CAPSULE | Freq: Three times a day (TID) | ORAL | Status: DC | PRN

## 2024-01-11 MED ORDER — ACETAMINOPHEN 325 MG PO TABS: 650.0000 mg | ORAL_TABLET | Freq: Four times a day (QID) | ORAL | Status: DC | PRN

## 2024-01-11 NOTE — Plan of Care (Signed)
?  Problem: Education: ?Goal: Knowledge of [facility] Education information/materials will improve ?Outcome: Not Progressing ?Goal: Emotional status will improve ?Outcome: Not Progressing ?Goal: Mental status will improve ?Outcome: Not Progressing ?Goal: Verbalization of understanding the information provided will improve ?Outcome: Not Progressing ?  ?Problem: Activity: ?Goal: Interest or engagement in activities will improve ?Outcome: Not Progressing ?Goal: Sleeping patterns will improve ?Outcome: Not Progressing ?  ?Problem: Coping: ?Goal: Ability to verbalize frustrations and anger appropriately will improve ?Outcome: Not Progressing ?Goal: Ability to demonstrate self-control will improve ?Outcome: Not Progressing ?  ?Problem: Health Behavior/Discharge Planning: ?Goal: Identification of resources available to assist in meeting health care needs will improve ?Outcome: Not Progressing ?Goal: Compliance with treatment plan for underlying cause of condition will improve ?Outcome: Not Progressing ?  ?Problem: Physical Regulation: ?Goal: Ability to maintain clinical measurements within normal limits will improve ?Outcome: Not Progressing ?  ?Problem: Safety: ?Goal: Periods of time without injury will increase ?Outcome: Not Progressing ?  ?Problem: Education: ?Goal: Utilization of techniques to improve thought processes will improve ?Outcome: Not Progressing ?Goal: Knowledge of the prescribed therapeutic regimen will improve ?Outcome: Not Progressing ?  ?Problem: Activity: ?Goal: Interest or engagement in leisure activities will improve ?Outcome: Not Progressing ?Goal: Imbalance in normal sleep/wake cycle will improve ?Outcome: Not Progressing ?  ?Problem: Coping: ?Goal: Coping ability will improve ?Outcome: Not Progressing ?Goal: Will verbalize feelings ?Outcome: Not Progressing ?  ?Problem: Health Behavior/Discharge Planning: ?Goal: Ability to make decisions will improve ?Outcome: Not Progressing ?Goal:  Compliance with therapeutic regimen will improve ?Outcome: Not Progressing ?  ?Problem: Role Relationship: ?Goal: Will demonstrate positive changes in social behaviors and relationships ?Outcome: Not Progressing ?  ?Problem: Safety: ?Goal: Ability to disclose and discuss suicidal ideas will improve ?Outcome: Not Progressing ?Goal: Ability to identify and utilize support systems that promote safety will improve ?Outcome: Not Progressing ?  ?Problem: Self-Concept: ?Goal: Will verbalize positive feelings about self ?Outcome: Not Progressing ?Goal: Level of anxiety will decrease ?Outcome: Not Progressing ?  ?

## 2024-01-11 NOTE — ED Notes (Signed)
 TTS to bedside to have pt sign consent forms for voluntary admission.

## 2024-01-11 NOTE — Progress Notes (Signed)
   01/11/24 1430  Spiritual Encounters  Type of Visit Initial  Care provided to: Patient  Conversation partners present during encounter Nurse  Reason for visit Routine spiritual support  OnCall Visit Yes   Chaplain visited with patient to bring a Bible and sat and offered a compassionate presence.  Patient shared some experiences he's having with housing and neighbors that are bringing him great anxiety.   Patient is also concerned about a service dog that his son is supposed to care for.  Chaplain used reflective listening and empathy during the spiritual care encounter.  Rev. Rana M. Nicholaus, M.Div. Chaplain Resident  [facility]

## 2024-01-11 NOTE — Group Note (Deleted)
 Date:  01/11/2024 Time:  8:31 PM  Group Topic/Focus:  Building Self Esteem:   The Focus of this group is helping patients become aware of the effects of self-esteem on their lives, the things they and others do that enhance or undermine their self-esteem, seeing the relationship between their level of self-esteem and the choices they make and learning ways to enhance self-esteem.     Participation Level:  {BHH PARTICIPATION OZCZO:77735}  Participation Quality:  {BHH PARTICIPATION QUALITY:22265}  Affect:  {BHH AFFECT:22266}  Cognitive:  {BHH COGNITIVE:22267}  Insight: {BHH Insight2:20797}  Engagement in Group:  {BHH ENGAGEMENT IN HMNLE:77731}  Modes of Intervention:  {BHH MODES OF INTERVENTION:22269}  Additional Comments:  ***  Camellia HERO Sirron Francesconi 01/11/2024, 8:31 PM

## 2024-01-11 NOTE — ED Provider Notes (Signed)
-----------------------------------------   4:52 AM on 01/11/2024 -----------------------------------------  Repeat BMP shows improvement in Potassium (now 3.2 from 2.7), stable sodium.   [name], MD 01/11/24 [phone]

## 2024-01-11 NOTE — Group Note (Signed)
 Date:  01/11/2024 Time:  8:34 PM  Group Topic/Focus:  Building Self Esteem:   The Focus of this group is helping patients become aware of the effects of self-esteem on their lives, the things they and others do that enhance or undermine their self-esteem, seeing the relationship between their level of self-esteem and the choices they make and learning ways to enhance self-esteem.    Participation Level:  Active  Participation Quality:  Appropriate  Affect:  Appropriate  Cognitive:  Appropriate  Insight: Appropriate  Engagement in Group:  Engaged  Modes of Intervention:  Activity  Additional Comments:    Camellia HERO Kailin Principato 01/11/2024, 8:34 PM

## 2024-01-11 NOTE — ED Notes (Signed)
 Lunch tray provided to pt.

## 2024-01-11 NOTE — ED Notes (Signed)
Breakfast tray provided to pt.

## 2024-01-11 NOTE — TOC CM/SW Note (Signed)
..  Transition of Care [facility]) - Inpatient Brief Assessment   Patient Details  Name: Jeremiah [name] MRN: [id number] Date of Birth: March 21, 1966  [facility] of [facility] CM/SW Contact:    Edsel DELENA Fischer, LCSW Phone Number: 01/11/2024, 11:18 AM   Clinical Narrative:  SW received message from ALICIA with [facility] regarding [phone].  Pt is awaiting bed at [facility] or BMU. SW messaged MD and [facility] team to follow up with Bernarda  Transition of Care Asessment:

## 2024-01-11 NOTE — ED Notes (Signed)
 Pt requested for shower supplies. Shower supplies,hospital scrubs and socks provided to pt

## 2024-01-11 NOTE — Progress Notes (Signed)
 Patient admitted voluntary to BMU from [facility] ED with diagnosis of depression. Patient presents to unit ambulatory A&Ox4. Patient states, I have been feeling overwhelmed. Patient's affect is calm, speech is logical/coherent and thoughts are organized. OR Patient's affect is extremely anxious and worried / Patient endorses depression and anxiety stating his main stressor is that he is being put out of his apartment. Patient currently denies suicidal ideations, homicidal ideations, audio or visual hallucinations and verbally contracts for safety on unit.  He denies pain at this time. Denies incontinence and reports last BM yesterday. Patient denies smoking, drug abuse, or ETOH use. Patient reports having no support system.   Emotional support and reassurance provided throughout admission intake. Afterwards, oriented patient to unit, room and call light, reviewed POC with all questions answered and understanding verbalzied. Denies any needs at this time.  Will continue to monitor with ongoing Q 15 minute safety checks per unit protocol.

## 2024-01-11 NOTE — Progress Notes (Signed)
   01/11/24 1500  Psych Admission Type (Psych Patients Only)  Admission Status Voluntary  Psychosocial Assessment  Patient Complaints Anxiety;Depression  Eye Contact Brief  Facial Expression Blank  Affect Anxious  Speech Logical/coherent  Interaction Assertive  Motor Activity Slow  Appearance/Hygiene In scrubs  Behavior Characteristics Cooperative  Mood Depressed  Thought Process  Coherency WDL  Content WDL  Delusions None reported or observed  Perception WDL  Hallucination None reported or observed  Judgment Impaired  Confusion None  Danger to Self  Current suicidal ideation? Denies  Danger to Others  Danger to Others None reported or observed

## 2024-01-11 NOTE — ED Notes (Signed)
 Vol /recommend inpatient psych /moved to [facility]

## 2024-01-12 MED ORDER — QUETIAPINE FUMARATE 200 MG PO TABS
200.0000 mg | ORAL_TABLET | Freq: Every day | ORAL | Status: DC
  Administered 2024-01-12 – 2024-01-13 (×2): 200 mg via ORAL
  Filled 2024-01-12 (×2): qty 1

## 2024-01-12 MED ORDER — AMLODIPINE BESYLATE 5 MG PO TABS
10.0000 mg | ORAL_TABLET | Freq: Every day | ORAL | Status: DC
  Administered 2024-01-12 – 2024-01-13 (×2): 10 mg via ORAL
  Filled 2024-01-12 (×3): qty 2

## 2024-01-12 MED ORDER — CARVEDILOL 25 MG PO TABS
50.0000 mg | ORAL_TABLET | Freq: Two times a day (BID) | ORAL | Status: DC
Start: 2024-01-12 — End: 2024-01-12
  Filled 2024-01-12: qty 2

## 2024-01-12 MED ORDER — CARVEDILOL 25 MG PO TABS
50.0000 mg | ORAL_TABLET | Freq: Every day | ORAL | Status: DC
  Administered 2024-01-12 – 2024-01-13 (×2): 50 mg via ORAL
  Filled 2024-01-12 (×2): qty 2

## 2024-01-12 MED ORDER — MELATONIN 5 MG PO TABS
5.0000 mg | ORAL_TABLET | Freq: Every day | ORAL | Status: DC
  Administered 2024-01-12 – 2024-01-15 (×4): 5 mg via ORAL
  Filled 2024-01-12 (×4): qty 1

## 2024-01-12 MED ORDER — BUSPIRONE HCL 5 MG PO TABS
7.5000 mg | ORAL_TABLET | Freq: Two times a day (BID) | ORAL | Status: DC
  Administered 2024-01-12 – 2024-01-16 (×8): 7.5 mg via ORAL
  Filled 2024-01-12 (×9): qty 2

## 2024-01-12 MED ORDER — HYDROXYZINE HCL 25 MG PO TABS
25.0000 mg | ORAL_TABLET | Freq: Three times a day (TID) | ORAL | Status: DC | PRN
  Administered 2024-01-13 – 2024-01-14 (×4): 25 mg via ORAL
  Filled 2024-01-12 (×4): qty 1

## 2024-01-12 MED ORDER — CHLORTHALIDONE 25 MG PO TABS
25.0000 mg | ORAL_TABLET | Freq: Every morning | ORAL | Status: DC
  Administered 2024-01-13: 25 mg via ORAL
  Filled 2024-01-12 (×2): qty 1

## 2024-01-12 MED ORDER — ATORVASTATIN CALCIUM 20 MG PO TABS
20.0000 mg | ORAL_TABLET | Freq: Every day | ORAL | Status: DC
  Administered 2024-01-12 – 2024-01-15 (×4): 20 mg via ORAL
  Filled 2024-01-12 (×4): qty 1

## 2024-01-12 NOTE — Plan of Care (Signed)

## 2024-01-12 NOTE — Group Note (Signed)
 Date:  01/12/2024 Time:  7:05 PM  Group Topic/Focus:  Activity Group:  The focus of the group is to encourage patients to go outside in the courtyard and get some fresh air and some exercise.    Participation Level:  Active  Participation Quality:  Appropriate  Affect:  Appropriate  Cognitive:  Appropriate  Insight: Appropriate  Engagement in Group:  Engaged  Modes of Intervention:  Activity  Additional Comments:    Camellia HERO Kester Stimpson 01/12/2024, 7:05 PM

## 2024-01-12 NOTE — Progress Notes (Signed)
   01/12/24 0039  Psych Admission Type (Psych Patients Only)  Admission Status Voluntary  Psychosocial Assessment  Patient Complaints Anxiety;Depression  Eye Contact Brief  Facial Expression Blank  Affect Anxious  Speech Logical/coherent  Interaction Assertive  Motor Activity Slow  Appearance/Hygiene In scrubs  Behavior Characteristics Cooperative  Mood Anxious;Depressed  Thought Process  Coherency WDL  Content WDL  Delusions None reported or observed  Perception WDL  Hallucination None reported or observed  Judgment Impaired  Confusion None  Danger to Self  Current suicidal ideation? Denies  Danger to Others  Danger to Others None reported or observed

## 2024-01-12 NOTE — Group Note (Signed)
 Date:  01/12/2024 Time:  6:53 PM  Group Topic/Focus:  Coping With Mental Health Crisis:   The purpose of this group is to help patients identify strategies for coping with mental health crisis.  Group discusses possible causes of crisis and ways to manage them effectively.    Participation Level:  Active  Participation Quality:  Appropriate  Affect:  Appropriate  Cognitive:  Appropriate  Insight: Appropriate  Engagement in Group:  Engaged  Modes of Intervention:  Activity  Additional Comments:    Jeremiah [name] Chelci [place] 01/12/2024, 6:53 PM
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
## 2024-01-12 NOTE — Group Note (Deleted)
 Date:  01/12/2024 Time:  9:01 PM  Group Topic/Focus:  Coping With Mental Health Crisis:   The purpose of this group is to help patients identify strategies for coping with mental health crisis.  Group discusses possible causes of crisis and ways to manage them effectively. Wrap-Up Group:   The focus of this group is to help patients review their daily goal of treatment and discuss progress on daily workbooks.     Participation Level:  {BHH PARTICIPATION OZCZO:77735}  Participation Quality:  {BHH PARTICIPATION QUALITY:22265}  Affect:  {BHH AFFECT:22266}  Cognitive:  {BHH COGNITIVE:22267}  Insight: {BHH Insight2:20797}  Engagement in Group:  {BHH ENGAGEMENT IN HMNLE:77731}  Modes of Intervention:  {BHH MODES OF INTERVENTION:22269}  Additional Comments:  ***  Arlester CHRISTELLA Servant 01/12/2024, 9:01 PM

## 2024-01-12 NOTE — Plan of Care (Signed)
   Problem: Education: Goal: Emotional status will improve Outcome: Progressing Goal: Mental status will improve Outcome: Progressing   Problem: Activity: Goal: Interest or engagement in activities will improve Outcome: Progressing

## 2024-01-12 NOTE — Progress Notes (Signed)
   01/12/24 0858  Psych Admission Type (Psych Patients Only)  Admission Status Voluntary  Psychosocial Assessment  Patient Complaints Anxiety;Depression  Eye Contact Brief  Facial Expression Blank  Affect Anxious  Speech Logical/coherent  Interaction Assertive  Motor Activity Slow  Appearance/Hygiene In scrubs  Behavior Characteristics Cooperative  Mood Anxious;Depressed  Thought Process  Coherency WDL  Content WDL  Delusions None reported or observed  Perception WDL  Hallucination None reported or observed  Judgment Impaired  Confusion None  Danger to Self  Current suicidal ideation? Denies  Danger to Others  Danger to Others None reported or observed

## 2024-01-12 NOTE — Group Note (Signed)
 Date:  01/12/2024 Time:  9:12 PM  Group Topic/Focus:  Coping With Mental Health Crisis:   The purpose of this group is to help patients identify strategies for coping with mental health crisis.  Group discusses possible causes of crisis and ways to manage them effectively. Wrap-Up Group:   The focus of this group is to help patients review their daily goal of treatment and discuss progress on daily workbooks.    Participation Level:  Did Not Attend    Additional Comments:    Jeremiah [name] 01/12/2024, 9:12 PM

## 2024-01-13 DIAGNOSIS — F259 Schizoaffective disorder, unspecified: Secondary | ICD-10-CM

## 2024-01-13 NOTE — Progress Notes (Addendum)
   01/13/24 1945  Psych Admission Type (Psych Patients Only)  Admission Status Voluntary  Psychosocial Assessment  Patient Complaints Anxiety;Depression;Insomnia  Eye Contact Fair  Facial Expression Anxious  Affect Anxious  Speech Logical/coherent  Interaction Assertive  Motor Activity Slow  Appearance/Hygiene In scrubs  Behavior Characteristics Cooperative;Anxious  Mood Anxious;Pleasant  Thought Process  Coherency WDL  Content WDL  Delusions None reported or observed  Perception WDL  Hallucination None reported or observed  Judgment Limited  Confusion None  Danger to Self  Current suicidal ideation? Denies  Danger to Others  Danger to Others None reported or observed   Progress note   D: Pt seen in dayroom. Pt denies SI, HI, AVH. Pt rates pain  0/10. Pt rates anxiety  2/10 and depression  3/10. Pt's main concern is his insomnia. I haven't been sleeping well. My doctor had to give me 800 of Seroquel  to get me to sleep at night. They said they were going to increase my dose here but I don't know if they did. They gave me Atarax  last night but it still took about 3-4 hours to fall asleep. Pt informed that he will have to ask the provider in the morning to increase his dose of Seroquel . It is currently 200 mg and has not been increased. No other concerns noted at this time.  A: Pt provided support and encouragement. Pt given scheduled medication as prescribed. PRNs as appropriate. Q15 min checks for safety.   R: Pt safe on the unit. Will continue to monitor.

## 2024-01-13 NOTE — Progress Notes (Signed)
   01/13/24 1000  Psych Admission Type (Psych Patients Only)  Admission Status Voluntary  Psychosocial Assessment  Patient Complaints Anxiety;Depression  Eye Contact Brief  Facial Expression Blank  Affect Anxious  Speech Logical/coherent  Interaction Assertive  Motor Activity Slow  Appearance/Hygiene In scrubs  Behavior Characteristics Cooperative  Mood Depressed  Thought Process  Coherency WDL  Content WDL  Delusions None reported or observed  Perception WDL  Hallucination None reported or observed  Judgment Impaired  Confusion None  Danger to Self  Current suicidal ideation? Denies  Danger to Others  Danger to Others None reported or observed

## 2024-01-13 NOTE — Plan of Care (Signed)
   Problem: Education: Goal: Emotional status will improve Outcome: Progressing Goal: Mental status will improve Outcome: Progressing

## 2024-01-13 NOTE — BHH Counselor (Signed)
 Patient reported that he has court hearing tomorrow at 61 AM with [facility]. Patient provided CSW with court hearing information.   CSW emailed latter on his behalf but will follow up with court in the morning.   CSW to continue to assess.   Clayborn Milnes, MSW, LCSWA 01/13/2024 4:08 PM
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
## 2024-01-13 NOTE — Group Note (Signed)
 Date:  01/13/2024 Time:  3:36 PM  Group Topic/Focus:  Rediscovering Joy:   The focus of this group is to explore various ways to relieve stress in a positive manner.    Participation Level:  Did Not Attend  P Leeon Makar CHRISTELLA Hover 01/13/2024, 3:36 PM

## 2024-01-13 NOTE — Group Note (Unsigned)
 Date:  01/13/2024 Time:  8:32 PM  Group Topic/Focus:  Overcoming Stress:   The focus of this group is to define stress and help patients assess their triggers. Wrap-Up Group:   The focus of this group is to help patients review their daily goal of treatment and discuss progress on daily workbooks.     Participation Level:  {BHH PARTICIPATION OZCZO:77735}  Participation Quality:  {BHH PARTICIPATION QUALITY:22265}  Affect:  {BHH AFFECT:22266}  Cognitive:  {BHH COGNITIVE:22267}  Insight: {BHH Insight2:20797}  Engagement in Group:  {BHH ENGAGEMENT IN HMNLE:77731}  Modes of Intervention:  {BHH MODES OF INTERVENTION:22269}  Additional Comments:  ***  Arlester CHRISTELLA Servant 01/13/2024, 8:32 PM

## 2024-01-13 NOTE — Group Note (Signed)
 Date:  01/13/2024 Time:  8:38 PM  Group Topic/Focus:  Coping With Mental Health Crisis:   The purpose of this group is to help patients identify strategies for coping with mental health crisis.  Group discusses possible causes of crisis and ways to manage them effectively. Wrap-Up Group:   The focus of this group is to help patients review their daily goal of treatment and discuss progress on daily workbooks.    Participation Level:  Minimal  Participation Quality:  Appropriate and Attentive  Affect:  Appropriate  Cognitive:  Appropriate  Insight: Appropriate and Good  Engagement in Group:  Limited  Modes of Intervention:  Activity  Additional Comments:     Jeremiah [name] 01/13/2024, 8:38 PM

## 2024-01-13 NOTE — Progress Notes (Signed)
 Pt withdrawn to room, affect flat and mood depressed. Difficulty falling asleep, endorsed anxiety and depression both rated moderate.  Denied SI/HI and AVH.  Took all scheduled medications and Aarax.   01/12/24 2300  Psych Admission Type (Psych Patients Only)  Admission Status Voluntary  Psychosocial Assessment  Patient Complaints Anxiety;Depression  Eye Contact Brief  Facial Expression Anxious  Affect Appropriate to circumstance  Speech Logical/coherent  Interaction Assertive  Motor Activity Slow  Appearance/Hygiene In scrubs  Behavior Characteristics Cooperative;Appropriate to situation  Mood Depressed;Anxious  Thought Process  Coherency WDL  Content WDL  Delusions None reported or observed  Perception WDL  Hallucination None reported or observed  Judgment Limited  Confusion None  Danger to Self  Current suicidal ideation? Denies  Danger to Others  Danger to Others None reported or observed   Problem: Education: Goal: Knowledge of [facility] Education information/materials will improve Outcome: Progressing Goal: Emotional status will improve Outcome: Progressing Goal: Mental status will improve Outcome: Progressing Goal: Verbalization of understanding the information provided will improve Outcome: Progressing   Problem: Activity: Goal: Interest or engagement in activities will improve Outcome: Progressing Goal: Sleeping patterns will improve Outcome: Progressing   Problem: Coping: Goal: Ability to verbalize frustrations and anger appropriately will improve Outcome: Progressing Goal: Ability to demonstrate self-control will improve Outcome: Progressing

## 2024-01-14 DIAGNOSIS — F259 Schizoaffective disorder, unspecified: Secondary | ICD-10-CM | POA: Diagnosis not present

## 2024-01-14 MED ORDER — QUETIAPINE FUMARATE 200 MG PO TABS
200.0000 mg | ORAL_TABLET | Freq: Every day | ORAL | Status: DC
  Administered 2024-01-14 – 2024-01-15 (×2): 200 mg via ORAL
  Filled 2024-01-14 (×2): qty 1

## 2024-01-14 MED ORDER — QUETIAPINE FUMARATE 200 MG PO TABS: 300.0000 mg | ORAL_TABLET | Freq: Every day | ORAL | Status: DC

## 2024-01-14 NOTE — Group Note (Signed)
 LCSW Group Therapy Note   Group Date: 01/14/2024 Start Time: 1300 End Time: 1400   Type of Therapy and Topic:  Group Therapy: Challenging Core Beliefs  Participation Level:  Active  Description of Group:  Patients were educated about core beliefs and asked to identify one harmful core belief that they have. Patients were asked to explore from where those beliefs originate. Patients were asked to discuss how those beliefs make them feel and the resulting behaviors of those beliefs. They were then be asked if those beliefs are true and, if so, what evidence they have to support them. Lastly, group members were challenged to replace those negative core beliefs with helpful beliefs.   Therapeutic Goals:   1. Patient will identify harmful core beliefs and explore the origins of such beliefs. 2. Patient will identify feelings and behaviors that result from those core beliefs. 3. Patient will discuss whether such beliefs are true. 4.  Patient will replace harmful core beliefs with helpful ones.  Summary of Patient Progress:  Patient actively engaged in processing and exploring how core beliefs are formed and how they impact thoughts, feelings, and behaviors. Patient proved open to input from peers and feedback from CSW. Patient demonstrated proficient  insight into the subject matter, was respectful and supportive of peers, and participated throughout the entire session.  Therapeutic Modalities: Cognitive Behavioral Therapy; Solution-Focused Therapy   Galya Dunnigan M Margean Korell, LCSWA 01/14/2024  2:08 PM

## 2024-01-14 NOTE — Plan of Care (Signed)
  Problem: Education: Goal: Emotional status will improve Outcome: Progressing Goal: Mental status will improve Outcome: Progressing Goal: Verbalization of understanding the information provided will improve Outcome: Progressing   Problem: Activity: Goal: Interest or engagement in activities will improve Outcome: Progressing   Problem: Health Behavior/Discharge Planning: Goal: Compliance with treatment plan for underlying cause of condition will improve Outcome: Progressing   Problem: Physical Regulation: Goal: Ability to maintain clinical measurements within normal limits will improve Outcome: Progressing   Problem: Safety: Goal: Periods of time without injury will increase Outcome: Progressing

## 2024-01-14 NOTE — Group Note (Signed)
 Recreation Therapy Group Note   Group Topic:Coping Skills  Group Date: 01/14/2024 Start Time: 1535 End Time: 1610 Facilitators: Celestia Jeoffrey BRAVO, LRT, CTRS Location: Craft Room  Group Description: Mind Map.  Patient was provided a blank template of a diagram with 32 blank boxes in a tiered system, branching from the center (similar to a bubble chart). LRT directed patients to label the middle of the diagram Coping Skills. LRT and patients then came up with 8 different coping skills as examples. Pt were directed to record their coping skills in the 2nd tier boxes closest to the center.  Patients would then share their coping skills with the group as LRT wrote them out. LRT gave a handout of 99 different coping skills at the end of group.   Goal Area(s) Addressed: Patients will be able to define "coping skills". Patient will identify new coping skills.  Patient will increase communication.   Affect/Mood: Appropriate   Participation Level: Active and Engaged   Participation Quality: Independent   Behavior: Appropriate, Calm, and Cooperative   Speech/Thought Process: Coherent   Insight: Good   Judgement: Good   Modes of Intervention: Education, Exploration, Worksheet, and Writing   Patient Response to Interventions:  Attentive, Engaged, Interested , and Receptive   Education Outcome:  Acknowledges education   Clinical Observations/Individualized Feedback: Jeremiah [name] was active in their participation of session activities and group discussion. Pt identified tent camping and fishing as coping skills.    Plan: Continue to engage patient in RT group sessions 2-3x/week.   Jeoffrey BRAVO Celestia, LRT, CTRS 01/14/2024 5:49 PM

## 2024-01-14 NOTE — Progress Notes (Signed)
   01/14/24 1300  Psych Admission Type (Psych Patients Only)  Admission Status Voluntary  Psychosocial Assessment  Patient Complaints Anxiety;Depression  Eye Contact Fair  Facial Expression Animated  Affect Appropriate to circumstance  Speech Logical/coherent  Interaction Assertive  Motor Activity Other (Comment) (appropriate for developmental age)  Appearance/Hygiene Unremarkable  Behavior Characteristics Cooperative;Anxious  Mood Pleasant (Patient writes his goal for today is to learn more life skills in group. He writes he will listen and participate to help meet that goal.)  Thought Process  Coherency WDL  Content WDL  Delusions None reported or observed  Perception WDL  Hallucination None reported or observed  Judgment Poor (improving)  Confusion None  Danger to Self  Current suicidal ideation? Denies  Danger to Others  Danger to Others None reported or observed
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
## 2024-01-14 NOTE — Group Note (Signed)
 Date:  01/14/2024 Time:  8:38 PM  Group Topic/Focus:  Wrap-Up Group:   The focus of this group is to help patients review their daily goal of treatment and discuss progress on daily workbooks.    Participation Level:  Active  Participation Quality:  Appropriate  Affect:  Appropriate  Cognitive:  Appropriate  Insight: Appropriate  Engagement in Group:  Engaged  Modes of Intervention:  Discussion and Education  Additional Comments:    Jeremiah [name] 01/14/2024, 8:38 PM

## 2024-01-14 NOTE — BH IP Treatment Plan (Signed)
 Interdisciplinary Treatment and Diagnostic Plan Update  01/14/2024 Time of Session: 10:34AM Jeremiah [name] MRN: [id number]  Principal Diagnosis: Schizo-affective psychosis (HCC)  Secondary Diagnoses: Principal Problem:   Schizo-affective psychosis (HCC)   Current Medications:  Current Facility-Administered Medications  Medication Dose Route Frequency Provider Last Rate Last Admin   acetaminophen  (TYLENOL ) tablet 650 mg  650 mg Oral Q6H PRN Madaram, Kondal R, MD       alum & mag hydroxide-simeth (MAALOX/MYLANTA) 200-200-20 MG/5ML suspension 30 mL  30 mL Oral Q4H PRN Madaram, Kondal R, MD   30 mL at 01/12/24 1517   amLODipine  (NORVASC ) tablet 10 mg  10 mg Oral Daily Cleotilde Hoy HERO, NP   10 mg at 01/13/24 1000   atorvastatin  (LIPITOR) tablet 20 mg  20 mg Oral QHS Cleotilde Hoy HERO, NP   20 mg at 01/13/24 2122   busPIRone  (BUSPAR ) tablet 7.5 mg  7.5 mg Oral BID Cleotilde Hoy HERO, NP   7.5 mg at 01/14/24 0830   carvedilol  (COREG ) tablet 50 mg  50 mg Oral QHS Cleotilde Hoy HERO, NP   50 mg at 01/13/24 2123   chlorthalidone  (HYGROTON ) tablet 25 mg  25 mg Oral q morning Cleotilde Hoy HERO, NP   25 mg at 01/13/24 1000   haloperidol  (HALDOL ) tablet 5 mg  5 mg Oral TID PRN Madaram, Kondal R, MD       And   diphenhydrAMINE  (BENADRYL ) capsule 50 mg  50 mg Oral TID PRN Madaram, Kondal R, MD       hydrOXYzine  (ATARAX ) tablet 25 mg  25 mg Oral TID PRN Bobbitt, Shalon E, NP   25 mg at 01/13/24 2317   magnesium  hydroxide (MILK OF MAGNESIA) suspension 30 mL  30 mL Oral Daily PRN Madaram, Kondal R, MD       melatonin tablet 5 mg  5 mg Oral QHS Cleotilde Hoy HERO, NP   5 mg at 01/13/24 2122   QUEtiapine  (SEROQUEL ) tablet 200 mg  200 mg Oral QHS Cleotilde Hoy HERO, NP   200 mg at 01/13/24 2123   PTA Medications: Medications Prior to Admission  Medication Sig Dispense Refill Last Dose/Taking   amLODipine  (NORVASC ) 10 MG tablet Take 1 tablet (10 mg total) by mouth daily. 30 tablet 2    Arginine  (L-ARGININE DOUBLE STRENGTH) 1000 MG TABS Take 5 tablets by mouth. (Patient not taking: No sig reported)      atorvastatin  (LIPITOR) 20 MG tablet TAKE ONE TABLET BY MOUTH EVERY NIGHT AT BEDTIME 100 tablet 0    busPIRone  (BUSPAR ) 7.5 MG tablet TAKE ONE TABLET (7.5 MG TOTAL) BY MOUTH TWO TIMES DAILY. 180 tablet 0    carvedilol  (COREG ) 25 MG tablet Take 2 tablets (50 mg total) by mouth 2 (two) times daily with a meal. TAKE TWO TABLETS BY MOUTH TWO TIMES A DAY 360 tablet 0    chlorthalidone  (HYGROTON ) 25 MG tablet Take 1 tablet (25 mg total) by mouth every morning. 30 tablet 2    Citrulline (PURE L-CITRULLINE) 600 MG CAPS Take 5 capsules by mouth. (Patient not taking: No sig reported)      clotrimazole -betamethasone  (LOTRISONE ) cream APPLY ONE APPLICATION TOPICALLY TWO (TWO) TIMES DAILY. (Patient not taking: Reported on 01/11/2024) 45 g 0    cyanocobalamin  (VITAMIN B12) 1000 MCG/ML injection Inject 1 mL (1,000 mcg total) into the muscle daily. 1 mL 0    cyclobenzaprine  (FLEXERIL ) 10 MG tablet TAKE ONE TABLET (10 MG TOTAL) BY MOUTH THREE TIMES DAILY AS NEEDED FOR  UP TO 20 DAYS FOR MUSCLE SPASMS. (Patient not taking: Reported on 01/11/2024) 60 tablet 2    ergocalciferol (VITAMIN D2) 1.25 MG (50000 UT) capsule Take by mouth. (Patient not taking: Reported on 01/11/2024)      galantamine (RAZADYNE ER) 24 MG 24 hr capsule Take 24 mg by mouth daily with breakfast. (Patient not taking: No sig reported)      hydrOXYzine  (ATARAX /VISTARIL ) 50 MG tablet Take 2 tablets (100 mg total) by mouth at bedtime as needed (sleep). (Patient not taking: Reported on 12/17/2023) 60 tablet 0    JUBLIA 10 % SOLN APPLY TO AFFECTED TOENAIL DAILY, COVERING THE TOE, NAIL BED, AND SURROUNDING SKIN ETC. 4 mL 2    magnesium  hydroxide (MILK OF MAGNESIA) 400 MG/5ML suspension Take 30 mLs by mouth daily as needed for mild constipation. (Patient not taking: No sig reported)      melatonin 3 MG TABS tablet Take by mouth. (Patient not taking: No sig  reported)      Omega 3-6-9 Fatty Acids (OMEGA-3-6-9 PO) Take by mouth. (Patient not taking: No sig reported)      ondansetron  (ZOFRAN ) 4 MG tablet Take 1 tablet (4 mg total) by mouth every 8 (eight) hours as needed for nausea or vomiting. (Patient not taking: No sig reported) 12 tablet 0    polyethylene glycol (MIRALAX ) packet Take 17 g by mouth daily. (Patient not taking: No sig reported) 14 each 0    tamsulosin (FLOMAX) 0.4 MG CAPS capsule Take 0.4 mg by mouth daily. (Patient not taking: No sig reported)       Patient Stressors:    Patient Strengths:    Treatment Modalities: Medication Management, Group therapy, Case management,  1 to 1 session with clinician, Psychoeducation, Recreational therapy.   Physician Treatment Plan for Primary Diagnosis: Schizo-affective psychosis (HCC) Long Term Goal(s): Improvement in symptoms so as ready for discharge   Short Term Goals: Ability to identify changes in lifestyle to reduce recurrence of condition will improve  Medication Management: Evaluate patient's response, side effects, and tolerance of medication regimen.  Therapeutic Interventions: 1 to 1 sessions, Unit Group sessions and Medication administration.  Evaluation of Outcomes: Not Met  Physician Treatment Plan for Secondary Diagnosis: Principal Problem:   Schizo-affective psychosis (HCC)  Long Term Goal(s): Improvement in symptoms so as ready for discharge   Short Term Goals: Ability to identify changes in lifestyle to reduce recurrence of condition will improve     Medication Management: Evaluate patient's response, side effects, and tolerance of medication regimen.  Therapeutic Interventions: 1 to 1 sessions, Unit Group sessions and Medication administration.  Evaluation of Outcomes: Not Met   RN Treatment Plan for Primary Diagnosis: Schizo-affective psychosis (HCC) Long Term Goal(s): Knowledge of disease and therapeutic regimen to maintain health will improve  Short Term  Goals: Ability to demonstrate self-control, Ability to participate in decision making will improve, Ability to verbalize feelings will improve, Ability to disclose and discuss suicidal ideas, Ability to identify and develop effective coping behaviors will improve, and Compliance with prescribed medications will improve  Medication Management: RN will administer medications as ordered by provider, will assess and evaluate patient's response and provide education to patient for prescribed medication. RN will report any adverse and/or side effects to prescribing provider.  Therapeutic Interventions: 1 on 1 counseling sessions, Psychoeducation, Medication administration, Evaluate responses to treatment, Monitor vital signs and CBGs as ordered, Perform/monitor CIWA, COWS, AIMS and Fall Risk screenings as ordered, Perform wound care treatments as ordered.  Evaluation  of Outcomes: Not Met   LCSW Treatment Plan for Primary Diagnosis: Schizo-affective psychosis (HCC) Long Term Goal(s): Safe transition to appropriate next level of care at discharge, Engage patient in therapeutic group addressing interpersonal concerns.  Short Term Goals: Engage patient in aftercare planning with referrals and resources, Increase social support, Increase ability to appropriately verbalize feelings, Increase emotional regulation, Facilitate acceptance of mental health diagnosis and concerns, and Increase skills for wellness and recovery  Therapeutic Interventions: Assess for all discharge needs, 1 to 1 time with Social worker, Explore available resources and support systems, Assess for adequacy in community support network, Educate family and significant other(s) on suicide prevention, Complete Psychosocial Assessment, Interpersonal group therapy.  Evaluation of Outcomes: Not Met   Progress in Treatment: Attending groups: Yes. Participating in groups: Yes. Taking medication as prescribed: Yes. Toleration medication:  Yes. Family/Significant other contact made: No, will contact:  once permission has been granted Patient understands diagnosis: Yes. Discussing patient identified problems/goals with staff: Yes. Medical problems stabilized or resolved: Yes. Denies suicidal/homicidal ideation: Yes. Issues/concerns per patient self-inventory: No. Other: none  New problem(s) identified: No, Describe:  none  New Short Term/Long Term Goal(s): detox, elimination of symptoms of psychosis, medication management for mood stabilization; elimination of SI thoughts; development of comprehensive mental wellness/sobriety plan.   Patient Goals:  get hooked up with some counseling  Discharge Plan or Barriers: CSW to assist in the development of appropriate discharge plans.   Reason for Continuation of Hospitalization: Anxiety Depression Medication stabilization Suicidal ideation  Estimated Length of Stay:  1-7 days  Last 3 [place] Suicide Severity Risk Score: Flowsheet Row Admission (Current) from [facility]/11/[facility] ED from 01/10/2024 in [facility] ED from 02/20/2023 in [facility]  C-SSRS RISK CATEGORY No Risk No Risk No Risk    Last [facility] 2/9 Scores:    11/13/2022   11:45 AM  Depression screen PHQ 2/9  Decreased Interest 0  Down, Depressed, Hopeless 0  PHQ - 2 Score 0  Altered sleeping 0  Tired, decreased energy 0  Change in appetite 0  Feeling bad or failure about yourself  0  Trouble concentrating 0  Moving slowly or fidgety/restless 0  Suicidal thoughts 0  PHQ-9 Score 0  Difficult doing work/chores Not difficult at all    Scribe for Treatment Team: Sherryle JINNY Margo, LCSW 01/14/2024 11:20 AM

## 2024-01-14 NOTE — Group Note (Signed)
 Recreation Therapy Group Note   Group Topic:General Recreation  Group Date: 01/14/2024 Start Time: 1040 End Time: 1140 Facilitators: Celestia Jeoffrey BRAVO, LRT, CTRS Location: Courtyard  Group Description: [employer]. LRT and patients played games of basketball, drew with chalk, and played corn hole while outside in the courtyard while getting fresh air and sunlight. Music was being played in the background. LRT and peers conversed about different games they have played before, what they do in their free time and anything else that is on their minds. LRT encouraged pts to drink water after being outside, sweating and getting their heart rate up.  Goal Area(s) Addressed: Patient will build on frustration tolerance skills. Patients will partake in a competitive play game with peers. Patients will gain knowledge of new leisure interest/hobby.    Affect/Mood: Appropriate   Participation Level: Active   Participation Quality: Independent   Behavior: Appropriate   Speech/Thought Process: Coherent   Insight: Good   Judgement: Good   Modes of Intervention: Activity   Patient Response to Interventions:  Receptive   Education Outcome:  Acknowledges education   Clinical Observations/Individualized Feedback: [place] was active in their participation of session activities and group discussion. Pt interacted well with LRT and peers duration of session.    Plan: Continue to engage patient in RT group sessions 2-3x/week.   [address], LRT, CTRS 01/14/2024 1:31 PM

## 2024-01-14 NOTE — Group Note (Signed)
 Date:  01/14/2024 Time:  10:40 AM  Group Topic/Focus:  Goals Group:   The focus of this group is to help patients establish daily goals to achieve during treatment and discuss how the patient can incorporate goal setting into their daily lives to aide in recovery.    Participation Level:  Active  Participation Quality:  Appropriate  Affect:  Appropriate  Cognitive:  Alert  Insight: Appropriate  Engagement in Group:  Engaged  Modes of Intervention:  Activity, Discussion, and Education  Additional Comments:    Jeremiah [name] 01/14/2024, 10:40 AM

## 2024-01-14 NOTE — Plan of Care (Signed)
   Problem: Education: Goal: Mental status will improve Outcome: Progressing Goal: Verbalization of understanding the information provided will improve Outcome: Progressing   Problem: Activity: Goal: Interest or engagement in activities will improve Outcome: Progressing Goal: Sleeping patterns will improve Outcome: Progressing

## 2024-01-14 NOTE — [facility] Suicide Risk Assessment (Signed)
 [facility] INPATIENT:  Family/Significant Other Suicide Prevention Education  Suicide Prevention Education:  Contact Attempts: Vickey Meints/payee ([phone]), has been identified by the patient as the family member/significant other with whom the patient will be residing, and identified as the person(s) who will aid the patient in the event of a mental health crisis.  With written consent from the patient, two attempts were made to provide suicide prevention education, prior to and/or following the patient's discharge.  We were unsuccessful in providing suicide prevention education.  A suicide education pamphlet was given to the patient to share with family/significant other.  Date and time of first attempt: 01/14/24 at 1:43 PM. Date and time of second attempt: Second attempt needed.  CSW was able to leave HIPAA compliant voicemail with contact information for follow through.  Nadara JONELLE Fam 01/14/2024, 1:48 PM

## 2024-01-14 NOTE — [facility] Counselor (Addendum)
 CSW spoke with [address] representative. Representative asked that court letter for patient's hearing that was today 01/14/24 at 9 AM be sent on his behalf via email and fax.   CSW has sent letter signed by physician via email and fax on patient's behalf. CSW received notice from representative that email was received.   CSW to continue to assess.   Cherryl Babin, MSW, LCSWA 01/14/2024 9:35 AM

## 2024-01-14 NOTE — Progress Notes (Signed)
   01/14/24 2100  Psych Admission Type (Psych Patients Only)  Admission Status Voluntary  Psychosocial Assessment  Patient Complaints Anxiety;Depression  Eye Contact Brief  Facial Expression Animated  Affect Appropriate to circumstance  Speech Logical/coherent  Interaction Assertive  Motor Activity Other (Comment)  Appearance/Hygiene Unremarkable  Behavior Characteristics Anxious  Mood Pleasant  Thought Process  Coherency WDL  Content WDL  Delusions None reported or observed  Perception WDL  Hallucination None reported or observed  Judgment Limited  Confusion None  Danger to Self  Current suicidal ideation? Denies  Danger to Others  Danger to Others None reported or observed

## 2024-01-15 DIAGNOSIS — F4310 Post-traumatic stress disorder, unspecified: Secondary | ICD-10-CM

## 2024-01-15 DIAGNOSIS — F259 Schizoaffective disorder, unspecified: Secondary | ICD-10-CM | POA: Diagnosis not present

## 2024-01-15 NOTE — Plan of Care (Signed)

## 2024-01-15 NOTE — Plan of Care (Signed)
  Problem: Education: Goal: Emotional status will improve Outcome: Progressing   Problem: Activity: Goal: Interest or engagement in activities will improve Outcome: Progressing   Problem: Coping: Goal: Ability to demonstrate self-control will improve Outcome: Progressing   Problem: Safety: Goal: Periods of time without injury will increase Outcome: Progressing   Problem: Activity: Goal: Interest or engagement in leisure activities will improve Outcome: Progressing

## 2024-01-15 NOTE — Group Note (Signed)
 Date:  01/15/2024 Time:  8:40 PM  Group Topic/Focus:  Overcoming Stress:   The focus of this group is to define stress and help patients assess their triggers.    Participation Level:  Active  Participation Quality:  Appropriate  Affect:  Appropriate  Cognitive:  Appropriate  Insight: Appropriate  Engagement in Group:  Developing/Improving  Modes of Intervention:  Discussion  Additional Comments:   Jacqualyn FORBES Penton 01/15/2024, 8:40 PM
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
## 2024-01-15 NOTE — Progress Notes (Signed)
   01/15/24 1000  Psych Admission Type (Psych Patients Only)  Admission Status Voluntary/72 hour document signed  Date 72 hour document signed  01/15/24  Time 72 hour document signed  0950  Psychosocial Assessment  Patient Complaints Worrying (Patient endorses worry over tasks he needs to complete after discharge.)  Eye Contact Fair  Facial Expression Animated  Affect Appropriate to circumstance  Speech Logical/coherent  Interaction Assertive  Motor Activity Other (Comment) (appropriate for developmental age)  Appearance/Hygiene Unremarkable  Behavior Characteristics Cooperative;Anxious  Mood Pleasant;Euthymic (Patient writes his goal for today is talking to everyone, getting to know each person. He writes he will be more social to help reach that goal.)  Thought Process  Coherency WDL  Content WDL  Delusions None reported or observed  Perception WDL  Hallucination None reported or observed  Judgment WDL (improving)  Confusion None  Danger to Self  Current suicidal ideation? Denies  Danger to Others  Danger to Others None reported or observed

## 2024-01-15 NOTE — Group Note (Signed)
 Recreation Therapy Group Note   Group Topic:Health and Wellness  Group Date: 01/15/2024 Start Time: 1000 End Time: 1130 Facilitators: Celestia Jeoffrey BRAVO, LRT, CTRS Location: Courtyard  Group Description: [employer]. LRT and patients played games of basketball, drew with chalk, and played corn hole while outside in the courtyard while getting fresh air and sunlight. Music was being played in the background. LRT and peers conversed about different games they have played before, what they do in their free time and anything else that is on their minds. LRT encouraged pts to drink water after being outside, sweating and getting their heart rate up.  Goal Area(s) Addressed: Patient will build on frustration tolerance skills. Patients will partake in a competitive play game with peers. Patients will gain knowledge of new leisure interest/hobby.    Affect/Mood: Appropriate   Participation Level: Active   Participation Quality: Independent   Behavior: Appropriate   Speech/Thought Process: Coherent   Insight: Good   Judgement: Good   Modes of Intervention: Activity   Patient Response to Interventions:  Receptive   Education Outcome:  Acknowledges education   Clinical Observations/Individualized Feedback: Orel was active in their participation of session activities and group discussion. Pt interacted well with LRT and peers duration of session.    Plan: Continue to engage patient in RT group sessions 2-3x/week.   Jeoffrey BRAVO Celestia, LRT, CTRS 01/15/2024 11:58 AM

## 2024-01-15 NOTE — Plan of Care (Signed)
   Problem: Education: Goal: Emotional status will improve Outcome: Progressing Goal: Mental status will improve Outcome: Progressing

## 2024-01-15 NOTE — Group Note (Signed)
 Recreation Therapy Group Note   Group Topic:Relaxation  Group Date: 01/15/2024 Start Time: 1530 End Time: 1630 Facilitators: Celestia Jeoffrey BRAVO, LRT, CTRS Location: Courtyard  Group Description: Meditation. LRT and patients discussed what they know about meditation and mindfulness. LRT played a Deep Breathing Meditation exercise script for patients to follow along to. LRT and patients discussed how meditation and deep breathing can be used as a coping skill post--discharge to help manage symptoms of stress.   Goal Area(s) Addressed: Patient will practice using relaxation technique. Patient will identify a new coping skill.  Patient will follow multistep directions to reduce anxiety and stress.   Affect/Mood: Appropriate   Participation Level: Active and Engaged   Participation Quality: Independent   Behavior: Appropriate, Calm, and Cooperative   Speech/Thought Process: Coherent   Insight: Good   Judgement: Good   Modes of Intervention: Education and Exploration   Patient Response to Interventions:  Attentive, Engaged, Interested , and Receptive   Education Outcome:  Acknowledges education   Clinical Observations/Individualized Feedback: Bradin was active in their participation of session activities and group discussion. Pt interacted well with LRT and peers duration of session.    Plan: Continue to engage patient in RT group sessions 2-3x/week.   Jeoffrey BRAVO Celestia, LRT, CTRS 01/15/2024 5:39 PM

## 2024-01-15 NOTE — BHH Counselor (Signed)
 CSW met with pt briefly per request. He stated that he wanted to meet his Child psychotherapist. Pt went on to explain that he is ready to go. He reported that he was told that he would only be here for three days but it was going on five here for him. CSW validated pt concerns and informed him that he would have to speak with the psych provider regarding these concerns. CSW informed him that provider would be notified. No other concerns expressed. Contact ended without incident.   Provider was notified and met with pt.   CSW met with pt briefly to discuss discharge/aftercare plans. He reported plans to return home. Pt stated that he drove here so his vehicle is in the parking lot. He endorsed interest in getting a referral for outpatient based mental health services in this area. CSW informed him that this would be arranged. Pt denied any use of tobacco products or substances, or any need for cessation services. No other concerns expressed. Contact ended without incident.  Nadara SAUNDERS. Chaim, MSW, LCSW, LCAS 01/15/2024 4:32 PM

## 2024-01-15 NOTE — Group Note (Unsigned)
 Date:  01/15/2024 Time:  8:23 PM  Group Topic/Focus:  Emotional Education:   The focus of this group is to discuss what feelings/emotions are, and how they are experienced.     Participation Level:  {BHH PARTICIPATION OZCZO:77735}  Participation Quality:  {BHH PARTICIPATION QUALITY:22265}  Affect:  {BHH AFFECT:22266}  Cognitive:  {BHH COGNITIVE:22267}  Insight: {BHH Insight2:20797}  Engagement in Group:  {BHH ENGAGEMENT IN HMNLE:77731}  Modes of Intervention:  {BHH MODES OF INTERVENTION:22269}  Additional Comments:  ***  Jeremiah [name] 01/15/2024, 8:23 PM

## 2024-01-15 NOTE — Group Note (Signed)
 LCSW Group Therapy Note  Group Date: 01/15/2024 Start Time: 1300 End Time: 1400   Type of Therapy and Topic:  Group Therapy: Anger Cues and Responses  Participation Level:  Active   Description of Group:   In this group, patients learned how to recognize the physical, cognitive, emotional, and behavioral responses they have to anger-provoking situations.  They identified a recent time they became angry and how they reacted.  They analyzed how their reaction was possibly beneficial and how it was possibly unhelpful.  The group discussed a variety of healthier coping skills that could help with such a situation in the future.  Focus was placed on how helpful it is to recognize the underlying emotions to our anger, because working on those can lead to a more permanent solution as well as our ability to focus on the important rather than the urgent.  Therapeutic Goals: Patients will remember their last incident of anger and how they felt emotionally and physically, what their thoughts were at the time, and how they behaved. Patients will identify how their behavior at that time worked for them, as well as how it worked against them. Patients will explore possible new behaviors to use in future anger situations. Patients will learn that anger itself is normal and cannot be eliminated, and that healthier reactions can assist with resolving conflict rather than worsening situations.  Summary of Patient Progress:   Patient was active during the group. He shared a recent occurrence wherein feeling vulnerable led to anger. He demonstrated fair insight into the subject matter, was respectful of peers, and participated throughout the entire session.  Therapeutic Modalities:   Cognitive Behavioral Therapy    Sherryle JINNY Margo, LCSW 01/15/2024  3:33 PM

## 2024-01-15 NOTE — Progress Notes (Signed)
   01/15/24 2100  Psych Admission Type (Psych Patients Only)  Admission Status Voluntary/72 hour document signed  Date 72 hour document signed  01/15/24  Time 72 hour document signed  0950  Psychosocial Assessment  Patient Complaints None  Eye Contact Fair  Facial Expression Animated  Affect Appropriate to circumstance  Speech Logical/coherent  Interaction Assertive  Motor Activity Slow  Appearance/Hygiene Unremarkable  Behavior Characteristics Cooperative;Appropriate to situation;Calm  Mood Pleasant;Euthymic  Thought Process  Coherency WDL  Content WDL  Delusions None reported or observed  Perception WDL  Hallucination None reported or observed  Judgment WDL  Confusion None  Danger to Self  Current suicidal ideation? Denies  Danger to Others  Danger to Others None reported or observed

## 2024-01-15 NOTE — Group Note (Signed)
 Date:  01/15/2024 Time:  5:52 PM  Group Topic/Focus:  Goals Group:   The focus of this group is to help patients establish daily goals to achieve during treatment and discuss how the patient can incorporate goal setting into their daily lives to aide in recovery. Self Care:   The focus of this group is to help patients understand the importance of self-care in order to improve or restore emotional, physical, spiritual, interpersonal, and financial health.    Participation Level:  Active  Participation Quality:  Appropriate and Attentive  Affect:  Appropriate  Cognitive:  Alert, Appropriate, and Oriented  Insight: Appropriate and Good  Engagement in Group:  Engaged  Modes of Intervention:  Activity  Additional Comments:  N/A  Jeremiah [name] 01/15/2024, 5:52 PM

## 2024-01-16 DIAGNOSIS — F259 Schizoaffective disorder, unspecified: Secondary | ICD-10-CM | POA: Diagnosis not present

## 2024-01-16 MED ORDER — QUETIAPINE FUMARATE 200 MG PO TABS: 200.0000 mg | ORAL_TABLET | Freq: Every day | ORAL | 0 refills | Status: DC

## 2024-01-16 MED ORDER — MELATONIN 5 MG PO TABS: 5.0000 mg | ORAL_TABLET | Freq: Every day | ORAL | 0 refills | Status: AC

## 2024-01-16 MED ORDER — HYDROXYZINE HCL 25 MG PO TABS: 25.0000 mg | ORAL_TABLET | Freq: Every day | ORAL | 0 refills | Status: AC | PRN

## 2024-01-16 NOTE — Progress Notes (Signed)
  [facility] Adult Case Management Discharge Plan :  Will you be returning to the same living situation after discharge:  Yes,  pt plans to return home upon discharge. At discharge, do you have transportation home?: Yes,  pt drove himself here and vehicle is in the parking lot. Do you have the ability to pay for your medications: Yes,  [employer] MEDICARE / DREMA DUAL COMPLETE  Release of information consent forms completed and in the chart;  Patient's signature needed at discharge.  Patient to Follow up at:  Follow-up Information     [facility] Follow up.   Why: Your appointment is scheduled for Wednesday, 01/23/24 at 9 AM. Contact information: [address] [place] [address] [phone]                 Next level of care provider has access to [facility] Link:no  Safety Planning and Suicide Prevention discussed: Yes,  SPE completed with payee, Maureen Hoa.     Has patient been referred to the Quitline?: Patient refused referral for treatment  Patient has been referred for addiction treatment: No known substance use disorder.  Nadara JONELLE Fam, LCSW 01/16/2024, 9:39 AM

## 2024-01-16 NOTE — [facility] Suicide Risk Assessment (Signed)
 [facility] Discharge Suicide Risk Assessment   Principal Problem: Schizo-affective psychosis [facility]) Discharge Diagnoses: Principal Problem:   Schizo-affective psychosis (HCC)   Total Time spent with patient: 45 minutes  Musculoskeletal: Strength & Muscle Tone: within normal limits Gait & Station: normal Patient leans: N/A  Psychiatric Specialty Exam  Presentation  General Appearance:  Casual  Eye Contact: Fair  Speech: Clear and Coherent  Speech Volume: Normal  Handedness:No data recorded  Mood and Affect  Mood: Euthymic  Duration of Depression Symptoms: No data recorded Affect: Congruent   Thought Process  Thought Processes: Coherent  Descriptions of Associations:Intact  Orientation:Full (Time, Place and Person)  Thought Content:Logical; WDL  History of Schizophrenia/Schizoaffective disorder:Yes  Duration of Psychotic Symptoms:Greater than six months  Hallucinations:Hallucinations: None  Ideas of Reference:None  Suicidal Thoughts:Suicidal Thoughts: No  Homicidal Thoughts:Homicidal Thoughts: No   Sensorium  Memory: Immediate Fair; Recent Fair  Judgment: Good  Insight: Good   Executive Functions  Concentration: Fair  Attention Span: Fair  Recall: Fair  Fund of Knowledge: Fair  Language: Fair   Psychomotor Activity  Psychomotor Activity: Psychomotor Activity: Normal   Assets  Assets: Communication Skills; Desire for Improvement   Sleep  Sleep: Sleep: Good  Estimated Sleeping Duration (Last 24 Hours): 6.25-8.50 hours  Physical Exam: Physical Exam Vitals and nursing note reviewed.  HENT:     Head: Atraumatic.  Eyes:     Extraocular Movements: Extraocular movements intact.  Pulmonary:     Effort: Pulmonary effort is normal.  Neurological:     Mental Status: He is alert and oriented to person, place, and time.    Review of Systems  Psychiatric/Behavioral:  Negative for depression, hallucinations, memory loss,  substance abuse and suicidal ideas. The patient is nervous/anxious. The patient does not have insomnia.    Blood pressure 104/68, pulse (!) 54, temperature (!) 97 F (36.1 C), resp. rate 17, SpO2 99%. There is no height or weight on file to calculate BMI.  Mental Status Per Nursing Assessment::   On Admission:  NA  Demographic Factors:  Male and Low socioeconomic status  Loss Factors: NA  Historical Factors: Impulsivity  Risk Reduction Factors:   Positive coping skills or problem solving skills  Continued Clinical Symptoms:  Previous psych dx  Cognitive Features That Contribute To Risk:  None    Suicide Risk:  Minimal: No identifiable suicidal ideation.  Patients presenting with no risk factors but with morbid ruminations; may be classified as minimal risk based on the severity of the depressive symptoms   Follow-up Information     [facility] Follow up.   Why: Your appointment is scheduled for Wednesday, 01/23/24 at 9 AM. Contact information: [address] [place] [address] [phone]         Albina GORMAN Dine, MD. Schedule an appointment as soon as possible for a visit today.   Specialty: Internal Medicine Why: Make appointment to follow up on Blood pressure medications Contact information: [address] [place] [address] [phone]                 Plan Of Care/Follow-up recommendations:  # It is recommended to the patient to continue psychiatric medications as prescribed, after discharge from the hospital.   # It is recommended to the patient to follow up with your outpatient psychiatric provider and PCP. # It was discussed with the patient, the impact of alcohol, drugs, tobacco have been there overall psychiatric and medical wellbeing, and total abstinence from substance use  was recommended. # Prescriptions provided or sent directly to preferred pharmacy at discharge. Patient agreeable to plan. Given the opportunity  to ask questions. Appears to feel comfortable with discharge.  # In the event of worsening symptoms, the patient is instructed to call the crisis hotline (988), 911 and or go to the nearest ED for appropriate evaluation and treatment of symptoms. To follow-up with primary care provider for other medical issues, concerns and or health care needs # Patient was discharged home as requested with a plan to follow up as noted above.    Donnice FORBES Right, PA-C 01/16/2024, 10:45 AM

## 2024-01-16 NOTE — Discharge Summary (Signed)
 Physician Discharge Summary Note  Patient:  Jeremiah [name] is an 58 y.o., male MRN:  [id number] DOB:  28-Oct-1965 Patient phone:  [phone] (home)  Patient address:   [address] [place] [address],   Total time spent: 40 min Date of Admission:  01/11/2024 Date of Discharge: 01/16/2024  Reason for Admission:  Patient presented to ED via pov voluntarily due to concerns of increased anxiety and mania.  He reported he had been off of his medications and was possibly being evicted from his reference residence.  He denied substance use and denied SI HI or AVH on presentation.  Principal Problem: Schizo-affective psychosis (HCC) Discharge Diagnoses: Principal Problem:   Schizo-affective psychosis (HCC)   Past Psychiatric History: See H&P  Family Psychiatric  History: See H&P Social History:  Social History   Substance and Sexual Activity  Alcohol Use No   Alcohol/week: 0.0 standard drinks of alcohol   Comment: last drink was in march 2016     Social History   Substance and Sexual Activity  Drug Use No    Social History   Socioeconomic History   Marital status: Single    Spouse name: Not on file   Number of children: Not on file   Years of education: Not on file   Highest education level: Not on file  Occupational History   Not on file  Tobacco Use   Smoking status: Former    Current packs/day: 0.00    Types: Cigarettes    Quit date: 08/19/2014    Years since quitting: 9.4   Smokeless tobacco: Never  Substance and Sexual Activity   Alcohol use: No    Alcohol/week: 0.0 standard drinks of alcohol    Comment: last drink was in march 2016   Drug use: No   Sexual activity: Not on file  Other Topics Concern   Not on file  Social History Narrative   Not on file   Social Drivers of Health   Financial Resource Strain: Not on file  Food Insecurity: No Food Insecurity (01/11/2024)   Hunger Vital Sign    Worried About Running Out of Food in the Last Year: Never  true    Ran Out of Food in the Last Year: Never true  Transportation Needs: No Transportation Needs (01/11/2024)   PRAPARE - Transportation    Lack of Transportation (Medical): No    Lack of Transportation (Non-Medical): No  Physical Activity: Not on file  Stress: Not on file  Social Connections: Not on file   Past Medical History:  Past Medical History:  Diagnosis Date   Anxiety    Benign essential HTN 08/27/2014   Borderline personality disorder (HCC)    BP (high blood pressure) 01/16/2015   Difficulty urinating    Diverticulosis    Dysuria    External hemorrhoid    Gastroparesis    Hypertension    Hypokalemia    Major depression    Microscopic hematuria    Nicotine  addiction    Over weight    Rectal fistula    Schizophrenia (HCC)    Stroke [facility])     Past Surgical History:  Procedure Laterality Date   BACK SURGERY     COLONOSCOPY     COLONOSCOPY WITH PROPOFOL  N/A 10/12/2014   Procedure: COLONOSCOPY WITH PROPOFOL ;  Surgeon: Deward CINDERELLA Piedmont, MD;  Location: [facility] ENDOSCOPY;  Service: Gastroenterology;  Laterality: N/A;   LUMBAR SPINE SURGERY     TONSILLECTOMY     Family History:  Family History  Problem Relation Age of Onset   Stroke Mother    Stroke Maternal Grandmother    Diabetes Mellitus II Sister     Hospital Course:    During the course of hospitalization, the patient received daily multiple modalities of treatments consisting of psychopharmacology, individual, group, psychoeducational, recreational, and milieu therapy, including case management to coordinate inpatient and outpatient care, in concert with weekly treatment team meetings. Discharge planning was initiated on the day of admission to ensure a safe discharge. The presenting symptoms were closely monitored and medications were adjusted as indicated.  Upon admission, Zyprexa , Temazepam , and scheduled Benadryl  were discontinued, and the patient was started on Seroquel  200 mg PO QHS and Melatonin 5 mg PO QHS for  mood stabilization and sleep regulation. Buspar  7.5 mg BID was later added for ongoing anxiety symptoms. The risks, benefits, side effects, and alternatives were discussed, and the patient provided informed consent. Metabolic profile and EKG monitoring were obtained while on atypical antipsychotic therapy. Blood pressure and rate control medications were held given vital signs during admission and pt was instructed to follow-up with PCP for restarting medications he verbalized understanding and indicates that he monitors his blood pressure regularly at home.  Over the course of hospitalization, the patient's mood and anxiety progressively stabilized. He consistently denied suicidal or homicidal ideation, hallucinations, paranoia, or delusions. He was medication compliant, reported improved sleep and appetite, and required no behavioral PRNs apart from one as-needed dose of hydroxyzine . He demonstrated good insight and judgment, was linear and logical in thought process, and engaged appropriately in the unit milieu. On exam prior to discharge, he appeared euthymic, calm, cooperative, and future-oriented, with no evidence of mania, psychosis, or acute risk concerns. Patient found out that he was not being evicted at present and planned to discharge to home.   A detailed risk assessment was completed based on clinical exam and individual risk factors, and acute suicide risk was determined to be low and acute violence risk low. Currently, all modifiable risks of harm to self or others have been addressed, and the patient is no longer appropriate for the acute inpatient setting. He is able to continue treatment for mental health needs in the community with the supports outlined below. He was educated on his discharge plan of care, including medications, follow-up appointments, available mental health resources, and crisis services, and he verbalized understanding of and agreement with this plan. He was instructed to  call 911 or present to the nearest emergency room should he experience decompensation in mood or return of suicidal or homicidal ideations.  Physical Findings: AIMS:  , ,  ,  ,    CIWA:    COWS:        Psychiatric Specialty Exam:  Presentation  General Appearance:  Casual  Eye Contact: Fair  Speech: Clear and Coherent  Speech Volume: Normal    Mood and Affect  Mood: Euthymic  Affect: Congruent   Thought Process  Thought Processes: Coherent  Descriptions of Associations:Intact  Orientation:Full (Time, Place and Person)  Thought Content:Logical; WDL  Hallucinations:Hallucinations: None  Ideas of Reference:None  Suicidal Thoughts:Suicidal Thoughts: No  Homicidal Thoughts:Homicidal Thoughts: No   Sensorium  Memory: Immediate Fair; Recent Fair  Judgment: Good  Insight: Good   Executive Functions  Concentration: Fair  Attention Span: Fair  Recall: Fair  Fund of Knowledge: Fair  Language: Fair   Psychomotor Activity  Psychomotor Activity: Psychomotor Activity: Normal  Musculoskeletal: Strength & Muscle Tone: within normal limits Gait &  Station: normal Assets  Assets: Manufacturing systems engineer; Desire for Improvement   Sleep  Sleep: Sleep: Good    Physical Exam: Physical Exam Vitals and nursing note reviewed.  HENT:     Head: Atraumatic.  Eyes:     Extraocular Movements: Extraocular movements intact.  Pulmonary:     Effort: Pulmonary effort is normal.  Neurological:     Mental Status: He is alert and oriented to person, place, and time.  Psychiatric:        Mood and Affect: Mood normal.        Behavior: Behavior normal.        Thought Content: Thought content normal.        Judgment: Judgment normal.    Review of Systems  Psychiatric/Behavioral:  Negative for depression, hallucinations, memory loss, substance abuse and suicidal ideas. The patient is nervous/anxious. The patient does not have insomnia.    Blood  pressure 104/68, pulse (!) 54, temperature (!) 97 F (36.1 C), resp. rate 17, SpO2 99%. There is no height or weight on file to calculate BMI.   Social History   Tobacco Use  Smoking Status Former   Current packs/day: 0.00   Types: Cigarettes   Quit date: 08/19/2014   Years since quitting: 9.4  Smokeless Tobacco Never   Tobacco Cessation:  N/A, patient does not currently use tobacco products   Blood Alcohol level:  Lab Results  Component Value Date   [facility] <15 01/10/2024   ETH <5 03/29/2015    Metabolic Disorder Labs:  Lab Results  Component Value Date   HGBA1C 5.4 12/04/2022   MPG 117 10/20/2014   No results found for: PROLACTIN Lab Results  Component Value Date   CHOL 188 12/04/2023   TRIG 133 12/04/2023   HDL 59 12/04/2023   CHOLHDL 3.2 12/04/2023   VLDL 17 03/29/2015   LDLCALC 106 (H) 12/04/2023   LDLCALC 69 07/25/2023    See Psychiatric Specialty Exam and Suicide Risk Assessment completed by Attending Physician prior to discharge.  Discharge destination:  Home  Is patient on multiple antipsychotic therapies at discharge:  No   Has Patient had three or more failed trials of antipsychotic monotherapy by history:  No  Recommended Plan for Multiple Antipsychotic Therapies: NA   Allergies as of 01/16/2024       Reactions   Diflucan  [fluconazole ] Diarrhea   Ciprofloxacin  Other (See Comments)   GI distress   Milk-related Compounds Other (See Comments)   GI problems   Other    Other reaction(s): Other (See Comments) GI problems   Pepto-bismol [bismuth Subsalicylate] Other (See Comments)   Bad reaction   Trazodone  And Nefazodone Other (See Comments)   Burn tongue and causes blisters   Elemental Sulfur Other (See Comments)   GI distress         Medication List     PAUSE taking these medications      Indication  amLODipine  10 MG tablet Wait to take this until your doctor or other care provider tells you to start again. Commonly known as:  NORVASC  Take 1 tablet (10 mg total) by mouth daily.  Indication: High Blood Pressure   carvedilol  25 MG tablet Wait to take this until your doctor or other care provider tells you to start again. Commonly known as: COREG  Take 2 tablets (50 mg total) by mouth 2 (two) times daily with a meal. TAKE TWO TABLETS BY MOUTH TWO TIMES A DAY  Indication: High Blood Pressure   chlorthalidone  25  MG tablet Wait to take this until your doctor or other care provider tells you to start again. Commonly known as: HYGROTON  Take 1 tablet (25 mg total) by mouth every morning.  Indication: High Blood Pressure       STOP taking these medications    cyanocobalamin  1000 MCG/ML injection Commonly known as: VITAMIN B12   magnesium  hydroxide 400 MG/5ML suspension Commonly known as: MILK OF MAGNESIA       TAKE these medications      Indication  atorvastatin  20 MG tablet Commonly known as: LIPITOR TAKE ONE TABLET BY MOUTH EVERY NIGHT AT BEDTIME  Indication: High Amount of Fats in the Blood   busPIRone  7.5 MG tablet Commonly known as: BUSPAR  TAKE ONE TABLET (7.5 MG TOTAL) BY MOUTH TWO TIMES DAILY.  Indication: Anxiety Disorder   hydrOXYzine  25 MG tablet Commonly known as: ATARAX  Take 1 tablet (25 mg total) by mouth daily as needed for anxiety (sleep). What changed:  medication strength how much to take when to take this reasons to take this  Indication: Feeling Anxious   Jublia 10 % Soln Generic drug: Efinaconazole APPLY TO AFFECTED TOENAIL DAILY, COVERING THE TOE, NAIL BED, AND SURROUNDING SKIN ETC.  Indication: Fungal Toenail Disease   melatonin 5 MG Tabs Take 1 tablet (5 mg total) by mouth at bedtime. What changed:  medication strength how much to take when to take this  Indication: Trouble Sleeping   QUEtiapine  200 MG tablet Commonly known as: SEROQUEL  Take 1 tablet (200 mg total) by mouth at bedtime.  Indication: schizoaffective disorder        Follow-up Information      [facility] Follow up.   Why: Your appointment is scheduled for Wednesday, 01/23/24 at 9 AM. Contact information: [address] [place] [address] [phone]         Albina GORMAN Dine, MD. Schedule an appointment as soon as possible for a visit today.   Specialty: Internal Medicine Why: Make appointment to follow up on Blood pressure medications Contact information: [address] [place] [address] [phone]                 Follow-up recommendations:    # It is recommended to the patient to continue psychiatric medications as prescribed, after discharge from the hospital.   # It is recommended to the patient to follow up with your outpatient psychiatric provider and PCP. # It was discussed with the patient, the impact of alcohol, drugs, tobacco have been there overall psychiatric and medical wellbeing, and total abstinence from substance use was recommended. # Prescriptions provided or sent directly to preferred pharmacy at discharge. Patient agreeable to plan. Given the opportunity to ask questions. Appears to feel comfortable with discharge.  # In the event of worsening symptoms, the patient is instructed to call the crisis hotline (988), 911 and or go to the nearest ED for appropriate evaluation and treatment of symptoms. To follow-up with primary care provider for other medical issues, concerns and or health care needs # Patient was discharged home as requested with a plan to follow up as noted above.      Signed: Donnice FORBES Right, PA-C 01/16/2024, 10:46 AM

## 2024-01-16 NOTE — Group Note (Signed)
 Date:  01/16/2024 Time:  1:27 PM  Group Topic/Focus:  Goals Group:   The focus of this group is to help patients establish daily goals to achieve during treatment and discuss how the patient can incorporate goal setting into their daily lives to aide in recovery.    Participation Level:  Active  Participation Quality:  Appropriate  Affect:  Appropriate  Cognitive:  Appropriate  Insight: Appropriate  Engagement in Group:  Engaged  Modes of Intervention:  Education and Socialization  Additional Comments:    Deitra Caron Mainland 01/16/2024, 1:27 PM

## 2024-01-16 NOTE — [facility] Suicide Risk Assessment (Signed)
 [facility] INPATIENT:  Family/Significant Other Suicide Prevention Education  Suicide Prevention Education:  Education Completed; Maureen Pursley/payee [phone]), has been identified by the patient as the family member/significant other with whom the patient will be residing, and identified as the person(s) who will aid the patient in the event of a mental health crisis (suicidal ideations/suicide attempt).  With written consent from the patient, the family member/significant other has been provided the following suicide prevention education, prior to the and/or following the discharge of the patient.  The suicide prevention education provided includes the following: Suicide risk factors Suicide prevention and interventions National Suicide Hotline telephone number [facility] assessment telephone number [facility] Emergency Assistance 911 [facility] and/or Residential Mobile Crisis Unit telephone number  Request made of family/significant other to: Remove weapons (e.g., guns, rifles, knives), all items previously/currently identified as safety concern.   Remove drugs/medications (over-the-counter, prescriptions, illicit drugs), all items previously/currently identified as a safety concern.  The family member/significant other verbalizes understanding of the suicide prevention education information provided.  The family member/significant other agrees to remove the items of safety concern listed above.  She stated that pt told her that he was here for medical reasons but had mentioned some anxiety and got put into the psych unit. Peppard denied any concerns with pt returning home, feeling that he was a danger to himself, or having access to weapons. No concerns expressed. Contact ended without incident.   Nadara JONELLE Fam 01/16/2024, 9:37 AM

## 2024-01-16 NOTE — Progress Notes (Signed)
 Patient given all of his belongings and he was discharged home via his own vehicle. Patient verbalized understanding.

## 2024-01-16 NOTE — Group Note (Signed)
 [facility] LCSW Group Therapy Note   Group Date: 01/16/2024 Start Time: 1230 End Time: 1320   Type of Therapy/Topic:  Group Therapy:  Emotion Regulation  Participation Level:  Did Not Attend    Description of Group:    The purpose of this group is to assist patients in learning to regulate negative emotions and experience positive emotions. Patients will be guided to discuss ways in which they have been vulnerable to their negative emotions. These vulnerabilities will be juxtaposed with experiences of positive emotions or situations, and patients challenged to use positive emotions to combat negative ones. Special emphasis will be placed on coping with negative emotions in conflict situations, and patients will process healthy conflict resolution skills.  Therapeutic Goals: Patient will identify two positive emotions or experiences to reflect on in order to balance out negative emotions:  Patient will label two or more emotions that they find the most difficult to experience:  Patient will be able to demonstrate positive conflict resolution skills through discussion or role plays:   Summary of Patient Progress: X   Therapeutic Modalities:   Cognitive Behavioral Therapy Feelings Identification Dialectical Behavioral Therapy   Nadara JONELLE Fam, LCSW

## 2024-01-16 NOTE — Care Management Important Message (Signed)
 Important Message  Patient Details  Name: [name] MRN: [id number] Date of Birth: 08-17-1965   Medicare Important Message Given:  Yes - Medicare IM     Nadara JONELLE Fam, LCSW 01/16/2024, 9:39 AM

## 2024-01-22 ENCOUNTER — Encounter: Payer: Self-pay | Admitting: Internal Medicine

## 2024-01-22 ENCOUNTER — Ambulatory Visit (INDEPENDENT_AMBULATORY_CARE_PROVIDER_SITE_OTHER): Admitting: Internal Medicine

## 2024-01-22 VITALS — BP 112/78 | HR 81 | Temp 98.5°F | Ht 65.0 in | Wt 175.4 lb

## 2024-01-22 DIAGNOSIS — E876 Hypokalemia: Secondary | ICD-10-CM | POA: Diagnosis not present

## 2024-01-22 DIAGNOSIS — E871 Hypo-osmolality and hyponatremia: Secondary | ICD-10-CM | POA: Diagnosis not present

## 2024-01-22 DIAGNOSIS — I10 Essential (primary) hypertension: Secondary | ICD-10-CM

## 2024-01-22 NOTE — Progress Notes (Signed)
 Established Patient Office Visit  Subjective:  Patient ID: Jeremiah [name], male    DOB: 04-30-1966  Age: 58 y.o. MRN: [id number]  Chief Complaint  Patient presents with   Follow-up    Hospital follow up    Hospital f/u for hypokalemia and hyponatremia with psych admission for anxiety.     No other concerns at this time.   Past Medical History:  Diagnosis Date   Anxiety    Benign essential HTN 08/27/2014   Borderline personality disorder (HCC)    BP (high blood pressure) 01/16/2015   Difficulty urinating    Diverticulosis    Dysuria    External hemorrhoid    Gastroparesis    Hypertension    Hypokalemia    Major depression    Microscopic hematuria    Nicotine  addiction    Over weight    Rectal fistula    Schizophrenia (HCC)    Stroke [facility])     Past Surgical History:  Procedure Laterality Date   BACK SURGERY     COLONOSCOPY     COLONOSCOPY WITH PROPOFOL  N/A 10/12/2014   Procedure: COLONOSCOPY WITH PROPOFOL ;  Surgeon: Deward CINDERELLA Piedmont, MD;  Location: [facility] ENDOSCOPY;  Service: Gastroenterology;  Laterality: N/A;   LUMBAR SPINE SURGERY     TONSILLECTOMY      Social History   Socioeconomic History   Marital status: Single    Spouse name: Not on file   Number of children: Not on file   Years of education: Not on file   Highest education level: Not on file  Occupational History   Not on file  Tobacco Use   Smoking status: Former    Current packs/day: 0.00    Types: Cigarettes    Quit date: 08/19/2014    Years since quitting: 9.4   Smokeless tobacco: Never  Substance and Sexual Activity   Alcohol use: No    Alcohol/week: 0.0 standard drinks of alcohol    Comment: last drink was in march 2016   Drug use: No   Sexual activity: Not on file  Other Topics Concern   Not on file  Social History Narrative   Not on file   Social Drivers of Health   Financial Resource Strain: Not on file  Food Insecurity: No Food Insecurity (01/11/2024)   Hunger Vital Sign     Worried About Running Out of Food in the Last Year: Never true    Ran Out of Food in the Last Year: Never true  Transportation Needs: No Transportation Needs (01/11/2024)   PRAPARE - Administrator, Civil Service (Medical): No    Lack of Transportation (Non-Medical): No  Physical Activity: Not on file  Stress: Not on file  Social Connections: Not on file  Intimate Partner Violence: Not At Risk (01/11/2024)   Humiliation, Afraid, Rape, and Kick questionnaire    Fear of Current or Ex-Partner: No    Emotionally Abused: No    Physically Abused: No    Sexually Abused: No    Family History  Problem Relation Age of Onset   Stroke Mother    Stroke Maternal Grandmother    Diabetes Mellitus II Sister     Allergies  Allergen Reactions   Diflucan  [Fluconazole ] Diarrhea   Ciprofloxacin  Other (See Comments)    GI distress   Milk-Related Compounds Other (See Comments)    GI problems   Other     Other reaction(Ivory Maduro): Other (See Comments) GI problems   Pepto-Bismol [Bismuth Subsalicylate]  Other (See Comments)    Bad reaction   Trazodone  And Nefazodone Other (See Comments)    Burn tongue and causes blisters   Elemental Sulfur Other (See Comments)    GI distress     Outpatient Medications Prior to Visit  Medication Sig   amLODipine  (NORVASC ) 10 MG tablet Take 1 tablet (10 mg total) by mouth daily.   atorvastatin  (LIPITOR) 20 MG tablet TAKE ONE TABLET BY MOUTH EVERY NIGHT AT BEDTIME   baclofen (LIORESAL) 10 MG tablet    busPIRone  (BUSPAR ) 7.5 MG tablet TAKE ONE TABLET (7.5 MG TOTAL) BY MOUTH TWO TIMES DAILY.   carvedilol  (COREG ) 25 MG tablet Take 2 tablets (50 mg total) by mouth 2 (two) times daily with a meal. TAKE TWO TABLETS BY MOUTH TWO TIMES A DAY   chlorthalidone  (HYGROTON ) 25 MG tablet Take 1 tablet (25 mg total) by mouth every morning.   cyanocobalamin  (VITAMIN B12) 1000 MCG/ML injection Inject 1,000 mcg into the muscle.   escitalopram  (LEXAPRO ) 10 MG tablet Take 10 mg  by mouth.   hydrOXYzine  (ATARAX ) 25 MG tablet Take 1 tablet (25 mg total) by mouth daily as needed for anxiety (sleep).   JUBLIA 10 % SOLN APPLY TO AFFECTED TOENAIL DAILY, COVERING THE TOE, NAIL BED, AND SURROUNDING SKIN ETC.   melatonin 5 MG TABS Take 1 tablet (5 mg total) by mouth at bedtime.   QUEtiapine  (SEROQUEL ) 200 MG tablet Take 1 tablet (200 mg total) by mouth at bedtime.   temazepam  (RESTORIL ) 15 MG capsule Take 15 mg by mouth at bedtime as needed.   Testosterone  1.62 % GEL SMARTSIG:40.5 Milligram(Cristol Engdahl) Topical Every Morning   No facility-administered medications prior to visit.    Review of Systems  Constitutional:  Positive for weight loss (2 lbs). Negative for fever.  HENT: Negative.    Eyes: Negative.   Respiratory: Negative.    Cardiovascular: Negative.   Gastrointestinal: Negative.   Genitourinary: Negative.   Skin: Negative.   Neurological: Negative.   Endo/Heme/Allergies: Negative.   Psychiatric/Behavioral:  The patient is nervous/anxious.        Objective:   BP 112/78   Pulse 81   Temp 98.5 F (36.9 C)   Ht 5' 5 (1.651 m)   Wt 175 lb 6.4 oz (79.6 kg)   SpO2 96%   BMI 29.19 kg/m   Vitals:   01/22/24 1339  BP: 112/78  Pulse: 81  Temp: 98.5 F (36.9 C)  Height: 5' 5 (1.651 m)  Weight: 175 lb 6.4 oz (79.6 kg)  SpO2: 96%  BMI (Calculated): 29.19    Physical Exam Vitals reviewed.  Constitutional:      Appearance: Normal appearance.  HENT:     Head: Normocephalic.     Left Ear: There is no impacted cerumen.     Nose: Nose normal.     Mouth/Throat:     Mouth: Mucous membranes are moist.     Pharynx: No posterior oropharyngeal erythema.  Eyes:     Extraocular Movements: Extraocular movements intact.     Pupils: Pupils are equal, round, and reactive to light.  Cardiovascular:     Rate and Rhythm: Regular rhythm.     Chest Wall: PMI is not displaced.     Pulses: Normal pulses.     Heart sounds: Normal heart sounds. No murmur  heard. Pulmonary:     Effort: Pulmonary effort is normal.     Breath sounds: Normal air entry. No rhonchi or rales.  Abdominal:  General: Abdomen is flat. Bowel sounds are normal. There is no distension.     Palpations: Abdomen is soft. There is no hepatomegaly, splenomegaly or mass.     Tenderness: There is no abdominal tenderness.  Genitourinary:    Prostate: Normal.  Musculoskeletal:        General: Normal range of motion.     Cervical back: Normal range of motion and neck supple.     Lumbar back: Tenderness (left sacroiliac region) present.     Right lower leg: No edema.     Left lower leg: No edema.  Skin:    General: Skin is warm and dry.     Comments: Kissing lesion on both lower legs macular rash which are circular.  Neurological:     General: No focal deficit present.     Mental Status: He is alert and oriented to person, place, and time.     Cranial Nerves: No cranial nerve deficit.     Motor: No weakness.  Psychiatric:        Mood and Affect: Mood normal.        Behavior: Behavior normal.      No results found for any visits on 01/22/24.      Assessment & Plan:  Darren was seen today for follow-up.  Hyponatremia -     BMP8+Anion Gap  Hypokalemia -     BMP8+Anion Gap  Primary hypertension    Problem List Items Addressed This Visit       Cardiovascular and Mediastinum   Hypertension     Other   Hypokalemia   Relevant Orders   BMP8+Anion Gap   Hyponatremia - Primary   Relevant Orders   BMP8+Anion Gap    No follow-ups on file.   Total time spent: 30 minutes  Sherrill Cinderella Perry, MD  01/22/2024   This document may have been prepared by [facility] Voice Recognition software and as such may include unintentional dictation errors.

## 2024-01-23 ENCOUNTER — Ambulatory Visit: Payer: Self-pay | Admitting: Internal Medicine

## 2024-01-23 LAB — BMP8+ANION GAP
Anion Gap: 16 mmol/L (ref 10.0–18.0)
BUN/Creatinine Ratio: 14 (ref 9–20)
BUN: 13 mg/dL (ref 6–24)
CO2: 23 mmol/L (ref 20–29)
Calcium: 10.1 mg/dL (ref 8.7–10.2)
Chloride: 99 mmol/L (ref 96–106)
Creatinine, Ser: 0.94 mg/dL (ref 0.76–1.27)
Glucose: 103 mg/dL — ABNORMAL HIGH (ref 70–99)
Potassium: 3.9 mmol/L (ref 3.5–5.2)
Sodium: 138 mmol/L (ref 134–144)
eGFR: 95 mL/min/1.73 (ref >59)

## 2024-01-23 NOTE — Progress Notes (Signed)
 Left VM to return call

## 2024-01-24 NOTE — Progress Notes (Signed)
 Left VM to return call

## 2024-02-01 ENCOUNTER — Other Ambulatory Visit: Payer: Self-pay | Admitting: Internal Medicine

## 2024-02-04 ENCOUNTER — Other Ambulatory Visit: Payer: Self-pay

## 2024-02-04 ENCOUNTER — Observation Stay
Admission: EM | Admit: 2024-02-04 | Discharge: 2024-02-06 | DRG: 644 | Disposition: A | Discharge status: Home / Self Care | Attending: Obstetrics and Gynecology | Admitting: Obstetrics and Gynecology

## 2024-02-04 DIAGNOSIS — Z823 Family history of stroke: Secondary | ICD-10-CM

## 2024-02-04 DIAGNOSIS — Z87891 Personal history of nicotine dependence: Secondary | ICD-10-CM | POA: Diagnosis not present

## 2024-02-04 DIAGNOSIS — Z91011 Allergy to milk products: Secondary | ICD-10-CM

## 2024-02-04 DIAGNOSIS — E222 Syndrome of inappropriate secretion of antidiuretic hormone: Secondary | ICD-10-CM | POA: Diagnosis present

## 2024-02-04 DIAGNOSIS — Z888 Allergy status to other drugs, medicaments and biological substances status: Secondary | ICD-10-CM | POA: Diagnosis not present

## 2024-02-04 DIAGNOSIS — E875 Hyperkalemia: Secondary | ICD-10-CM | POA: Diagnosis not present

## 2024-02-04 DIAGNOSIS — Z881 Allergy status to other antibiotic agents status: Secondary | ICD-10-CM

## 2024-02-04 DIAGNOSIS — T502X5A Adverse effect of carbonic-anhydrase inhibitors, benzothiadiazides and other diuretics, initial encounter: Secondary | ICD-10-CM | POA: Diagnosis present

## 2024-02-04 DIAGNOSIS — G4700 Insomnia, unspecified: Secondary | ICD-10-CM | POA: Diagnosis present

## 2024-02-04 DIAGNOSIS — F333 Major depressive disorder, recurrent, severe with psychotic symptoms: Secondary | ICD-10-CM | POA: Diagnosis present

## 2024-02-04 DIAGNOSIS — E876 Hypokalemia: Secondary | ICD-10-CM | POA: Diagnosis present

## 2024-02-04 DIAGNOSIS — E782 Mixed hyperlipidemia: Secondary | ICD-10-CM | POA: Diagnosis present

## 2024-02-04 DIAGNOSIS — Z8673 Personal history of transient ischemic attack (TIA), and cerebral infarction without residual deficits: Secondary | ICD-10-CM

## 2024-02-04 DIAGNOSIS — E871 Hypo-osmolality and hyponatremia: Principal | ICD-10-CM | POA: Diagnosis present

## 2024-02-04 DIAGNOSIS — W540XXA Bitten by dog, initial encounter: Secondary | ICD-10-CM

## 2024-02-04 DIAGNOSIS — Z79899 Other long term (current) drug therapy: Secondary | ICD-10-CM

## 2024-02-04 DIAGNOSIS — Z8639 Personal history of other endocrine, nutritional and metabolic disease: Secondary | ICD-10-CM

## 2024-02-04 DIAGNOSIS — G4733 Obstructive sleep apnea (adult) (pediatric): Secondary | ICD-10-CM | POA: Diagnosis present

## 2024-02-04 DIAGNOSIS — F411 Generalized anxiety disorder: Secondary | ICD-10-CM | POA: Diagnosis present

## 2024-02-04 DIAGNOSIS — Z23 Encounter for immunization: Secondary | ICD-10-CM

## 2024-02-04 DIAGNOSIS — I959 Hypotension, unspecified: Secondary | ICD-10-CM | POA: Diagnosis not present

## 2024-02-04 DIAGNOSIS — Z882 Allergy status to sulfonamides status: Secondary | ICD-10-CM | POA: Diagnosis not present

## 2024-02-04 DIAGNOSIS — F259 Schizoaffective disorder, unspecified: Secondary | ICD-10-CM | POA: Diagnosis present

## 2024-02-04 DIAGNOSIS — I10 Essential (primary) hypertension: Secondary | ICD-10-CM | POA: Diagnosis present

## 2024-02-04 DIAGNOSIS — F250 Schizoaffective disorder, bipolar type: Secondary | ICD-10-CM | POA: Diagnosis not present

## 2024-02-04 DIAGNOSIS — K5900 Constipation, unspecified: Secondary | ICD-10-CM | POA: Diagnosis present

## 2024-02-04 DIAGNOSIS — R631 Polydipsia: Secondary | ICD-10-CM | POA: Diagnosis present

## 2024-02-04 LAB — CBC
Hemoglobin: 14.6 g/dL (ref 13.0–17.0)
Platelets: 322 K/uL (ref 150–400)
WBC: 5.6 K/uL (ref 4.0–10.5)

## 2024-02-04 LAB — COMPREHENSIVE METABOLIC PANEL WITH GFR
ALT: 23 U/L (ref 0–44)
AST: 39 U/L (ref 15–41)
Albumin: 4.2 g/dL (ref 3.5–5.0)
Alkaline Phosphatase: 51 U/L (ref 38–126)
Anion gap: 12 (ref 5–15)
BUN: 12 mg/dL (ref 6–20)
CO2: 25 mmol/L (ref 22–32)
Calcium: 9.2 mg/dL (ref 8.9–10.3)
Chloride: 85 mmol/L — ABNORMAL LOW (ref 98–111)
Creatinine, Ser: 0.71 mg/dL (ref 0.61–1.24)
GFR, Estimated: >60 mL/min (ref >60)
Glucose, Bld: 111 mg/dL — ABNORMAL HIGH (ref 70–99)
Potassium: 2.8 mmol/L — ABNORMAL LOW (ref 3.5–5.1)
Sodium: 122 mmol/L — ABNORMAL LOW (ref 135–145)
Total Bilirubin: 1.4 mg/dL — ABNORMAL HIGH (ref 0.0–1.2)
Total Protein: 6.9 g/dL (ref 6.5–8.1)

## 2024-02-04 LAB — URINALYSIS, ROUTINE W REFLEX MICROSCOPIC
Bacteria, UA: NONE SEEN
Bilirubin Urine: NEGATIVE
Glucose, UA: NEGATIVE mg/dL
Ketones, ur: NEGATIVE mg/dL
Leukocytes,Ua: NEGATIVE
Nitrite: NEGATIVE
Protein, ur: NEGATIVE mg/dL
Specific Gravity, Urine: 1.008 (ref 1.005–1.030)
pH: 7 (ref 5.0–8.0)

## 2024-02-04 LAB — CBG MONITORING, ED: Glucose-Capillary: 131 mg/dL — ABNORMAL HIGH (ref 70–99)

## 2024-02-04 LAB — LIPASE, BLOOD: Lipase: 43 U/L (ref 11–51)

## 2024-02-04 LAB — MAGNESIUM: Magnesium: 1.8 mg/dL (ref 1.7–2.4)

## 2024-02-04 LAB — ETHANOL: Alcohol, Ethyl (B): <15 mg/dL (ref <15)

## 2024-02-04 MED ORDER — HYDROXYZINE HCL 50 MG PO TABS
25.0000 mg | ORAL_TABLET | Freq: Every day | ORAL | Status: DC | PRN
  Administered 2024-02-04: 25 mg via ORAL
  Filled 2024-02-04: qty 1

## 2024-02-04 MED ORDER — AMLODIPINE BESYLATE 10 MG PO TABS: 10.0000 mg | ORAL_TABLET | Freq: Every day | ORAL | Status: DC

## 2024-02-04 MED ORDER — ATORVASTATIN CALCIUM 20 MG PO TABS
20.0000 mg | ORAL_TABLET | Freq: Every day | ORAL | Status: DC
  Administered 2024-02-04 – 2024-02-05 (×2): 20 mg via ORAL
  Filled 2024-02-04 (×2): qty 1

## 2024-02-04 MED ORDER — HYDRALAZINE HCL 20 MG/ML IJ SOLN: 5.0000 mg | Freq: Four times a day (QID) | INTRAMUSCULAR | Status: DC | PRN

## 2024-02-04 MED ORDER — CARVEDILOL 25 MG PO TABS
50.0000 mg | ORAL_TABLET | Freq: Two times a day (BID) | ORAL | Status: DC
  Administered 2024-02-04: 50 mg via ORAL
  Filled 2024-02-04: qty 8

## 2024-02-04 MED ORDER — ESCITALOPRAM OXALATE 10 MG PO TABS
10.0000 mg | ORAL_TABLET | Freq: Every day | ORAL | Status: DC
  Administered 2024-02-05 – 2024-02-06 (×2): 10 mg via ORAL
  Filled 2024-02-04 (×2): qty 1

## 2024-02-04 MED ORDER — POTASSIUM CHLORIDE CRYS ER 20 MEQ PO TBCR
40.0000 meq | EXTENDED_RELEASE_TABLET | Freq: Once | ORAL | Status: AC
Start: 2024-02-04 — End: 2024-02-04
  Administered 2024-02-04: 40 meq via ORAL
  Filled 2024-02-04: qty 2

## 2024-02-04 MED ORDER — ONDANSETRON HCL 4 MG PO TABS: 4.0000 mg | ORAL_TABLET | Freq: Four times a day (QID) | ORAL | Status: DC | PRN

## 2024-02-04 MED ORDER — MIDAZOLAM HCL 2 MG/2ML IJ SOLN
2.0000 mg | Freq: Once | INTRAMUSCULAR | Status: AC
Start: 2024-02-04 — End: 2024-02-04
  Administered 2024-02-04: 2 mg via INTRAVENOUS
  Filled 2024-02-04: qty 2

## 2024-02-04 MED ORDER — PANTOPRAZOLE SODIUM 40 MG PO TBEC
40.0000 mg | DELAYED_RELEASE_TABLET | Freq: Two times a day (BID) | ORAL | Status: AC
  Administered 2024-02-04 – 2024-02-05 (×3): 40 mg via ORAL
  Filled 2024-02-04 (×3): qty 1

## 2024-02-04 MED ORDER — ACETAMINOPHEN 325 MG PO TABS: 650.0000 mg | ORAL_TABLET | Freq: Four times a day (QID) | ORAL | Status: DC | PRN

## 2024-02-04 MED ORDER — TEMAZEPAM 15 MG PO CAPS
15.0000 mg | ORAL_CAPSULE | Freq: Every evening | ORAL | Status: DC | PRN
  Filled 2024-02-04 (×4): qty 1

## 2024-02-04 MED ORDER — POLYETHYLENE GLYCOL 3350 17 G PO PACK
17.0000 g | PACK | Freq: Two times a day (BID) | ORAL | Status: DC | PRN
  Administered 2024-02-05: 17 g via ORAL
  Filled 2024-02-04: qty 1

## 2024-02-04 MED ORDER — ONDANSETRON HCL 4 MG/2ML IJ SOLN: 4.0000 mg | Freq: Four times a day (QID) | INTRAMUSCULAR | Status: DC | PRN

## 2024-02-04 MED ORDER — ACETAMINOPHEN 650 MG RE SUPP: 650.0000 mg | Freq: Four times a day (QID) | RECTAL | Status: DC | PRN

## 2024-02-04 MED ORDER — ALUM & MAG HYDROXIDE-SIMETH 200-200-20 MG/5ML PO SUSP
15.0000 mL | Freq: Four times a day (QID) | ORAL | Status: DC | PRN
Start: 2024-02-04 — End: 2024-02-04

## 2024-02-04 MED ORDER — TETANUS-DIPHTH-ACELL PERTUSSIS 5-2.5-18.5 LF-MCG/0.5 IM SUSY
0.5000 mL | PREFILLED_SYRINGE | Freq: Once | INTRAMUSCULAR | Status: AC
  Administered 2024-02-04: 0.5 mL via INTRAMUSCULAR
  Filled 2024-02-04: qty 0.5

## 2024-02-04 MED ORDER — LORAZEPAM 2 MG PO TABS
2.0000 mg | ORAL_TABLET | Freq: Four times a day (QID) | ORAL | Status: AC | PRN
  Administered 2024-02-04 – 2024-02-05 (×2): 2 mg via ORAL
  Filled 2024-02-04: qty 1
  Filled 2024-02-04: qty 2

## 2024-02-04 MED ORDER — POTASSIUM CHLORIDE 10 MEQ/100ML IV SOLN
10.0000 meq | Freq: Once | INTRAVENOUS | Status: AC
  Administered 2024-02-04: 10 meq via INTRAVENOUS
  Filled 2024-02-04: qty 100

## 2024-02-04 MED ORDER — SODIUM CHLORIDE 0.9 % IV SOLN: 12.5000 mg | Freq: Four times a day (QID) | INTRAVENOUS | Status: AC | PRN

## 2024-02-04 MED ORDER — SENNOSIDES-DOCUSATE SODIUM 8.6-50 MG PO TABS
1.0000 | ORAL_TABLET | Freq: Two times a day (BID) | ORAL | Status: DC
  Administered 2024-02-04 – 2024-02-06 (×4): 1 via ORAL
  Filled 2024-02-04 (×4): qty 1

## 2024-02-04 MED ORDER — HEPARIN SODIUM (PORCINE) 5000 UNIT/ML IJ SOLN
5000.0000 [IU] | Freq: Three times a day (TID) | INTRAMUSCULAR | Status: DC
  Filled 2024-02-04 (×2): qty 1

## 2024-02-04 MED ORDER — ALUM & MAG HYDROXIDE-SIMETH 200-200-20 MG/5ML PO SUSP: 15.0000 mL | Freq: Four times a day (QID) | ORAL | Status: DC | PRN

## 2024-02-04 MED ORDER — SODIUM CHLORIDE 0.9 % IV SOLN: Freq: Once | INTRAVENOUS | Status: AC

## 2024-02-04 MED ORDER — POTASSIUM CHLORIDE CRYS ER 20 MEQ PO TBCR
40.0000 meq | EXTENDED_RELEASE_TABLET | Freq: Every evening | ORAL | Status: AC
  Administered 2024-02-04: 40 meq via ORAL
  Filled 2024-02-04: qty 2

## 2024-02-04 MED ORDER — MELATONIN 5 MG PO TABS
5.0000 mg | ORAL_TABLET | Freq: Every day | ORAL | Status: DC
  Administered 2024-02-04 – 2024-02-05 (×2): 5 mg via ORAL
  Filled 2024-02-04 (×2): qty 1

## 2024-02-04 MED ORDER — LACTATED RINGERS IV SOLN
INTRAVENOUS | Status: DC
Start: 2024-02-04 — End: 2024-02-05

## 2024-02-04 MED ORDER — QUETIAPINE FUMARATE 25 MG PO TABS
200.0000 mg | ORAL_TABLET | Freq: Every day | ORAL | Status: DC
Start: 2024-02-04 — End: 2024-02-05
  Administered 2024-02-04: 200 mg via ORAL
  Filled 2024-02-04: qty 1

## 2024-02-04 MED ORDER — MAGNESIUM CITRATE PO SOLN: 0.5000 | Freq: Every day | ORAL | Status: AC | PRN

## 2024-02-04 NOTE — Assessment & Plan Note (Addendum)
 Suspect secondary to chlorthalidone  and polydipsia Magnesium  level is 1.8 Status post potassium chloride  40 mEq p.o. one-time dose, potassium chloride  10 mEq IV per EDP On admission I have ordered additional potassium chloride  40 mEq p.o., once Check EKG due to hypokalemia Recheck BMP in the a.m.

## 2024-02-04 NOTE — Assessment & Plan Note (Addendum)
 Suspect secondary to chlorthalidone  use and polydipsia Status post sodium chloride  150 mL/h, once ordered by EDP LR infusion 100 mL/h, 1 day ordered Strict I's and O's, regular diet Recheck BMP in the AM

## 2024-02-04 NOTE — ED Provider Notes (Signed)
   [facility] Provider Note    Event Date/Time   First MD Initiated Contact with Patient 02/04/24 1145     (approximate)  History   Chief Complaint: Abdominal Pain  HPI  Jeremiah [name] is a 58 y.o. male with a past medical history of anxiety, hypertension, schizophrenia, presents to the emergency department with feeling manic.  According to the patient he has not been sleeping very well he has been feeling very jittery and anxious.  States some constipation at times but to myself denies any abdominal pain.  Patient denies any alcohol or drug use.  Patient states he has been prescribed multiple sleep medications but they have not been working and he has not been sleeping.  Physical Exam   Triage Vital Signs: ED Triage Vitals [02/04/24 1030]  Encounter Vitals Group     BP 126/87     Girls Systolic BP Percentile      Girls Diastolic BP Percentile      Boys Systolic BP Percentile      Boys Diastolic BP Percentile      Pulse Rate 64     Resp 19     Temp 98 F (36.7 C)     Temp src      SpO2 100 %     Weight 175 lb 6.4 oz (79.6 kg)     Height 5' 5 (1.651 m)     Head Circumference      Peak Flow      Pain Score 5     Pain Loc      Pain Education      Exclude from Growth Chart     Most recent vital signs: Vitals:   02/04/24 1030  BP: 126/87  Pulse: 64  Resp: 19  Temp: 98 F (36.7 C)  SpO2: 100%    General: Awake, no distress.  CV:  Good peripheral perfusion.  Regular rate and rhythm  Resp:  Normal effort.  Equal breath sounds bilaterally.  Abd:  No distention.  Soft, nontender.  No rebound or guarding.  Benign abdomen.  ED Results / Procedures / Treatments   MEDICATIONS ORDERED IN ED: Medications  midazolam  (VERSED ) injection 2 mg (has no administration in time range)  0.9 %  sodium chloride  infusion ( Intravenous New Bag/Given 02/04/24 1214)     IMPRESSION / MDM / ASSESSMENT AND PLAN / ED COURSE  I reviewed the triage vital signs  and the nursing notes.  Patient's presentation is most consistent with acute presentation with potential threat to life or bodily function.  Patient presents emergency department for lack of sleep, feeling anxious and jittery.  Patient's lab work today shows a normal white blood cell count, chemistry shows significant hyponatremia down to 122.  Urinalysis is normal.  I spoke to the patient regarding these labs, he denies alcohol or beer use.  However does state he drinks a significant amount of water each day, highly suspect polydipsia to be the cause of the patient's hyponatremia.  However given his hyponatremia down to 122 we will admit the patient for IV fluids/normal saline, oral fluid restriction until the patient's sodium normalizes.  Patient's potassium is also low we will replete and I have added on a magnesium  level.  FINAL CLINICAL IMPRESSION(S) / ED DIAGNOSES   Hyponatremia Hypokalemia   Note:  This document was prepared using Dragon voice recognition software and may include unintentional dictation errors.   Dorothyann Drivers, MD 02/04/24 1313

## 2024-02-04 NOTE — ED Triage Notes (Signed)
 Pt comes with belly pain and constipation for over week. Pt also got bit by a dog a few weeks ago. Pt states he has also had some bad breath and possible infection in his tooth.

## 2024-02-04 NOTE — Progress Notes (Signed)
 Patient ordered on CPAP at night. Patient stated had sleep study done and wasn't ordered on one and has never had one at home.

## 2024-02-04 NOTE — Assessment & Plan Note (Signed)
 Home amlodipine  10 mg daily, carvedilol  50 mg p.o. twice daily resumed

## 2024-02-04 NOTE — Assessment & Plan Note (Signed)
 Quetiapine  200 mg nightly, temazepam  15 mg nightly as needed for sleep, hydroxyzine  25 mg daily as needed for sleep resumed

## 2024-02-04 NOTE — H&P (Addendum)
 History and Physical   Jeremiah [name] FMW:[id number] DOB: 08-Jun-1965 DOA: 02/04/2024  PCP: Albina GORMAN Dine, MD  Outpatient Specialists: Dr. Jannett Fairly, neurology Patient coming from: Home  I have personally briefly reviewed patient's old medical records in [facility] EMR.  Chief Concern: Insomnia, constipation, abdominal pain  HPI: Jeremiah [name] is a 58 year old male with history of schizophrenia, insomnia, hypertension, hyperlipidemia, who presents emergency department for chief concerns of abdominal pain and constipation, insomnia for about 1 week.  Vitals in the ED T 98, rr 19, hr 64, blood pressure 126/87, SpO2 100% on room air.  Serum sodium is 122, potassium 2.8, chloride 85, bicarb 25, BUN of 12, serum creatinine 0.71, eGFR greater than 60, nonfasting glucose 111, WBC 5.6, hemoglobin 14.6, platelets of 322.  UA was positive for moderate hemoglobin and negative for bacteria, leukocytes, nitrates, glucose.  ED treatment: Versed  2 mg IV one-time dose, potassium chloride  40 mEq p.o. one-time dose, potassium chloride  10 mEq IV one-time dose. --------------------------------------- At bedside, patient able to tell me his first and last name, age, location, current calendar year.  He reports that over the last week, he has not been sleeping at all.  He reports that 3 weeks ago he was bitten by a dog.  He reports he has not been feeling well.  He reports he does not know why his electrolytes were abnormal.  Social history: Patient is currently being evicted from home and has insecure housing.  ROS: Constitutional: no weight change, no fever ENT/Mouth: no sore throat, no rhinorrhea Eyes: no eye pain, no vision changes Cardiovascular: no chest pain, no dyspnea,  no edema, no palpitations Respiratory: no cough, no sputum, no wheezing Gastrointestinal: no nausea, no vomiting, no diarrhea, no constipation Genitourinary: no urinary incontinence, no dysuria, no  hematuria Musculoskeletal: no arthralgias, no myalgias Skin: no skin lesions, no pruritus, Neuro: + weakness, no loss of consciousness, no syncope, + insomnia Psych: no anxiety, no depression, no decrease appetite Heme/Lymph: no bruising, no bleeding  ED Course: Discussed with EDP, patient requiring hospitalization for chief concerns of electrolyte imbalance.  Assessment/Plan  Principal Problem:   Hypokalemia Active Problems:   Hyponatremia   GAD (generalized anxiety disorder)   OSA (obstructive sleep apnea)   Schizo-affective psychosis (HCC)   Hypertension   History of hypogonadism   Mixed hyperlipidemia   Insomnia   Dog bite   Major depressive disorder, recurrent episode, severe, with psychosis (HCC)   Constipation   Polypharmacy   Assessment and Plan:  * Hypokalemia Suspect secondary to chlorthalidone  and polydipsia Magnesium  level is 1.8 Status post potassium chloride  40 mEq p.o. one-time dose, potassium chloride  10 mEq IV per EDP On admission I have ordered additional potassium chloride  40 mEq p.o., once Check EKG due to hypokalemia Recheck BMP in the a.m.  Hyponatremia Suspect secondary to chlorthalidone  use and polydipsia Status post sodium chloride  150 mL/h, once ordered by EDP LR infusion 100 mL/h, 1 day ordered Strict I's and O's, regular diet Recheck BMP in the AM  Schizo-affective psychosis (HCC) Home quetiapine  200 mg nightly resumed  OSA (obstructive sleep apnea) CPAP nightly ordered  GAD (generalized anxiety disorder) PDMP reviewed Home hydroxyzine  25 mg daily as needed for sleep, anxiety resumed Current active prescription includes temazepam  15 mg written on 12/17/2023 and filled on 01/14/2024, 30-day supply Lorazepam  2 mg p.o. every 6 hours as needed for anxiety, 2 doses were  Dog bite Status post tetanus shot I evaluated his picture from 3 weeks ago, it  appears that the skin is intact Patient states that he feels it in his bone.  I looked at  the picture that he took once again and there is no indication of skin breakage   Insomnia Quetiapine  200 mg nightly, temazepam  15 mg nightly as needed for sleep, hydroxyzine  25 mg daily as needed for sleep resumed  Mixed hyperlipidemia Home atorvastatin  25 mg nightly resume  Hypertension Home amlodipine  10 mg daily, carvedilol  50 mg p.o. twice daily resumed  Constipation Senna docusate twice daily scheduled, 5 days ordered MiraLAX  17 g p.o. twice daily as needed for moderate, mild constipation, 5 days ordered Magnesium  citrate solution 0.5 bottle, daily as needed for severe constipation, 1 day ordered  Major depressive disorder, recurrent episode, severe, with psychosis (HCC) Home escitalopram  10 mg daily resumed for 02/05/2024  Chart reviewed.   DVT prophylaxis: Heparin  5000 units subcutaneous every 8 hours Code Status: Full code Diet: Regular on admission due to hyponatremia.  AM team to initiate cardiac diet when appropriate Family Communication: A phone call was offered, patient declined. Disposition Plan: Pending clinical course Consults called: None at this time Admission status: Telemetry medical, observation  Past Medical History:  Diagnosis Date   Anxiety    Benign essential HTN 08/27/2014   Borderline personality disorder (HCC)    BP (high blood pressure) 01/16/2015   Difficulty urinating    Diverticulosis    Dysuria    External hemorrhoid    Gastroparesis    Hypertension    Hypokalemia    Major depression    Microscopic hematuria    Nicotine  addiction    Over weight    Rectal fistula    Schizophrenia (HCC)    Stroke [facility])    Past Surgical History:  Procedure Laterality Date   BACK SURGERY     COLONOSCOPY     COLONOSCOPY WITH PROPOFOL  N/A 10/12/2014   Procedure: COLONOSCOPY WITH PROPOFOL ;  Surgeon: Deward CINDERELLA Piedmont, MD;  Location: [facility] ENDOSCOPY;  Service: Gastroenterology;  Laterality: N/A;   LUMBAR SPINE SURGERY     TONSILLECTOMY     Social History:   reports that he quit smoking about 9 years ago. His smoking use included cigarettes. He has never used smokeless tobacco. He reports that he does not drink alcohol and does not use drugs.  Allergies  Allergen Reactions   Diflucan  [Fluconazole ] Diarrhea   Ciprofloxacin  Other (See Comments)    GI distress   Milk-Related Compounds Other (See Comments)    GI problems   Other     Other reaction(s): Other (See Comments) GI problems   Pepto-Bismol [Bismuth Subsalicylate] Other (See Comments)    Bad reaction   Trazodone  And Nefazodone Other (See Comments)    Burn tongue and causes blisters   Elemental Sulfur Other (See Comments)    GI distress    Family History  Problem Relation Age of Onset   Stroke Mother    Stroke Maternal Grandmother    Diabetes Mellitus II Sister    Family history: Family history reviewed and not pertinent.  Prior to Admission medications   Medication Sig Start Date End Date Taking? Authorizing Provider  amLODipine  (NORVASC ) 10 MG tablet Take 1 tablet (10 mg total) by mouth daily. 12/17/23 03/16/24  Albina GORMAN Dine, MD  atorvastatin  (LIPITOR) 20 MG tablet TAKE ONE TABLET BY MOUTH EVERY NIGHT AT BEDTIME 01/07/24   Albina GORMAN Dine, MD  baclofen (LIORESAL) 10 MG tablet  05/25/21   [provider]  busPIRone  (BUSPAR ) 7.5 MG tablet  TAKE ONE TABLET (7.5 MG TOTAL) BY MOUTH TWO TIMES DAILY. 10/01/23   Albina GORMAN Dine, MD  carvedilol  (COREG ) 25 MG tablet Take 2 tablets (50 mg total) by mouth 2 (two) times daily with a meal. TAKE TWO TABLETS BY MOUTH TWO TIMES A DAY 12/17/23 03/16/24  Albina GORMAN Dine, MD  chlorthalidone  (HYGROTON ) 25 MG tablet Take 1 tablet (25 mg total) by mouth every morning. 12/17/23 03/16/24  Albina GORMAN Dine, MD  cyanocobalamin  (VITAMIN B12) 1000 MCG/ML injection Inject 1,000 mcg into the muscle. 01/24/22   [provider]  escitalopram  (LEXAPRO ) 10 MG tablet Take 10 mg by mouth. 06/01/23   [provider]  hydrOXYzine   (ATARAX ) 25 MG tablet Take 1 tablet (25 mg total) by mouth daily as needed for anxiety (sleep). 01/16/24   Debbie Donnice BRAVO, PA-C  JUBLIA 10 % SOLN APPLY TO AFFECTED TOENAIL DAILY, COVERING THE TOE, NAIL BED, AND SURROUNDING SKIN ETC. 12/14/23   Tejan-Sie, S Ahmed, MD  melatonin 5 MG TABS Take 1 tablet (5 mg total) by mouth at bedtime. 01/16/24   Millington, Matthew E, PA-C  QUEtiapine  (SEROQUEL ) 100 MG tablet TAKE ONE TABLET BY MOUTH EVERY NIGHT AT BEDTIME WITH 300MG  FOR A TOTAL OF 700MG  02/04/24   Albina GORMAN Dine, MD  QUEtiapine  (SEROQUEL ) 200 MG tablet Take 1 tablet (200 mg total) by mouth at bedtime. 01/16/24   Millington, Matthew E, PA-C  QUEtiapine  (SEROQUEL ) 300 MG tablet TAKE TWO TABLETS BY MOUTH AT BEDTIME WITH 100 MG TABLETS. **NEED APPT** 02/04/24   Tejan-Sie, S Ahmed, MD  temazepam  (RESTORIL ) 15 MG capsule Take 15 mg by mouth at bedtime as needed. 01/14/24   [provider]  Testosterone  1.62 % GEL SMARTSIG:40.5 Milligram(s) Topical Every Morning    [provider]   Physical Exam: Vitals:   02/04/24 1030 02/04/24 1432 02/04/24 1435 02/04/24 1523  BP: 126/87 111/89  112/80  Pulse: 64   67  Resp: 19 13  20   Temp: 98 F (36.7 C)  98.1 F (36.7 C) (!) 97.4 F (36.3 C)  TempSrc:   Oral Oral  SpO2: 100%   99%  Weight: 79.6 kg     Height: 5' 5 (1.651 m)      Constitutional: appears age-appropriate, poorly kept, disheveled, NAD, calm Eyes: PERRL, lids and conjunctivae normal ENMT: Mucous membranes are moist. Posterior pharynx clear of any exudate or lesions. Age-appropriate dentition. Hearing appropriate Neck: normal, supple, no masses, no thyromegaly Respiratory: clear to auscultation bilaterally, no wheezing, no crackles. Normal respiratory effort. No accessory muscle use.  Cardiovascular: Regular rate and rhythm, no murmurs / rubs / gallops. No extremity edema. 2+ pedal pulses. No carotid bruits.  Abdomen: no tenderness, no masses palpated, no  hepatosplenomegaly. Bowel sounds positive.  Musculoskeletal: no clubbing / cyanosis. No joint deformity upper and lower extremities. Good ROM, no contractures, no atrophy. Normal muscle tone.  Skin: no rashes, lesions, ulcers. No induration Neurologic: Sensation intact. Strength 5/5 in all 4.  Psychiatric: Normal judgment and insight. Alert and oriented x 3. Normal mood.   EKG: independently reviewed, showing sinus rhythm with rate of 61, first-degree AV block, QTc 457  Chest x-ray on Admission: Not indicated at this time Labs on Admission: I have personally reviewed following labs  CBC: Recent Labs  Lab 02/04/24 1030  WBC 5.6  HGB 14.6  HCT RESULTS UNAVAILABLE DUE TO INTERFERING SUBSTANCE  MCV RESULTS UNAVAILABLE DUE TO INTERFERING SUBSTANCE  PLT 322   Basic Metabolic Panel: Recent Labs  Lab 02/04/24 1030  NA 122*  K 2.8*  CL 85*  CO2 25  GLUCOSE 111*  BUN 12  CREATININE 0.71  CALCIUM  9.2  MG 1.8   GFR: Estimated Creatinine Clearance: 99 mL/min (by C-G formula based on SCr of 0.71 mg/dL).  Liver Function Tests: Recent Labs  Lab 02/04/24 1030  AST 39  ALT 23  ALKPHOS 51  BILITOT 1.4*  PROT 6.9  ALBUMIN 4.2   Recent Labs  Lab 02/04/24 1030  LIPASE 43   Urine analysis:    Component Value Date/Time   COLORURINE YELLOW (A) 02/04/2024 1031   APPEARANCEUR CLEAR (A) 02/04/2024 1031   APPEARANCEUR Clear 12/14/2014 1017   LABSPEC 1.008 02/04/2024 1031   LABSPEC 1.002 08/20/2014 2122   PHURINE 7.0 02/04/2024 1031   GLUCOSEU NEGATIVE 02/04/2024 1031   GLUCOSEU Negative 08/20/2014 2122   HGBUR MODERATE (A) 02/04/2024 1031   BILIRUBINUR NEGATIVE 02/04/2024 1031   BILIRUBINUR Negative 12/14/2014 1017   BILIRUBINUR Negative 08/20/2014 2122   KETONESUR NEGATIVE 02/04/2024 1031   PROTEINUR NEGATIVE 02/04/2024 1031   UROBILINOGEN 0.2 10/15/2014 1816   NITRITE NEGATIVE 02/04/2024 1031   LEUKOCYTESUR NEGATIVE 02/04/2024 1031   LEUKOCYTESUR Negative 08/20/2014  2122   This document was prepared using Dragon Voice Recognition software and may include unintentional dictation errors.  Dr. Sherre [facility]  If 7PM-7AM, please contact overnight-coverage provider If 7AM-7PM, please contact day attending provider [email or web address]  02/04/2024, 6:22 PM

## 2024-02-04 NOTE — Assessment & Plan Note (Signed)
 Home escitalopram  10 mg daily resumed for 02/05/2024

## 2024-02-04 NOTE — Assessment & Plan Note (Signed)
 Home quetiapine  200 mg nightly resumed

## 2024-02-04 NOTE — Assessment & Plan Note (Signed)
 Senna docusate twice daily scheduled, 5 days ordered MiraLAX  17 g p.o. twice daily as needed for moderate, mild constipation, 5 days ordered Magnesium  citrate solution 0.5 bottle, daily as needed for severe constipation, 1 day ordered

## 2024-02-04 NOTE — ED Notes (Signed)
 Pt sleeping at this time.

## 2024-02-04 NOTE — Assessment & Plan Note (Signed)
 CPAP nightly ordered

## 2024-02-04 NOTE — Assessment & Plan Note (Signed)
 Home atorvastatin  25 mg nightly resume

## 2024-02-04 NOTE — Hospital Course (Addendum)
 [name] is a 58 year old male with history of schizophrenia, insomnia, hypertension, hyperlipidemia, who presents emergency department for chief concerns of abdominal pain and constipation, insomnia for about 1 week.  Vitals in the ED T 98, rr 19, hr 64, blood pressure 126/87, SpO2 100% on room air.  Serum sodium is 122, potassium 2.8, chloride 85, bicarb 25, BUN of 12, serum creatinine 0.71, eGFR greater than 60, nonfasting glucose 111, WBC 5.6, hemoglobin 14.6, platelets of 322.  UA was positive for moderate hemoglobin and negative for bacteria, leukocytes, nitrates, glucose.  Patient was admitted for hyponatremia and hypokalemia.  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  TSH and a.m. cortisol was normal.  Patient was started on fluid restriction and salt tablets, sodium improved to 133 on the day of discharge.  Patient was given some salt tablets and need to have a close follow-up with PCP for further assistance.  Potassium was repleted before discharge.  Magnesium  was normal.  Patient was found to have borderline soft blood pressure which was within goal without any antihypertensives, his home amlodipine  and carvedilol  was also held.  Patient has borderline bradycardia and hypotension on admission.  He need close follow-up with PCP and they can restart blood pressure medications as needed.  Constipation improved with bowel regimen, he was given lactulose  to be used twice daily as needed.  Patient will continue with his rest of the home medications which were mostly psych medications and need to have a close follow-up with his psychiatrist for further assistance.

## 2024-02-04 NOTE — Assessment & Plan Note (Addendum)
 Status post tetanus shot I evaluated his picture from 3 weeks ago, it appears that the skin is intact Patient states that he feels it in his bone.  I looked at the picture that he took once again and there is no indication of skin breakage

## 2024-02-04 NOTE — Assessment & Plan Note (Addendum)
 PDMP reviewed Home hydroxyzine  25 mg daily as needed for sleep, anxiety resumed Current active prescription includes temazepam  15 mg written on 12/17/2023 and filled on 01/14/2024, 30-day supply Lorazepam  2 mg p.o. every 6 hours as needed for anxiety, 2 doses were

## 2024-02-05 DIAGNOSIS — K5900 Constipation, unspecified: Secondary | ICD-10-CM | POA: Diagnosis present

## 2024-02-05 DIAGNOSIS — T502X5A Adverse effect of carbonic-anhydrase inhibitors, benzothiadiazides and other diuretics, initial encounter: Secondary | ICD-10-CM | POA: Diagnosis present

## 2024-02-05 DIAGNOSIS — W540XXA Bitten by dog, initial encounter: Secondary | ICD-10-CM | POA: Diagnosis not present

## 2024-02-05 DIAGNOSIS — G4733 Obstructive sleep apnea (adult) (pediatric): Secondary | ICD-10-CM | POA: Diagnosis present

## 2024-02-05 DIAGNOSIS — F333 Major depressive disorder, recurrent, severe with psychotic symptoms: Secondary | ICD-10-CM | POA: Diagnosis present

## 2024-02-05 DIAGNOSIS — Z8673 Personal history of transient ischemic attack (TIA), and cerebral infarction without residual deficits: Secondary | ICD-10-CM | POA: Diagnosis not present

## 2024-02-05 DIAGNOSIS — Z823 Family history of stroke: Secondary | ICD-10-CM | POA: Diagnosis not present

## 2024-02-05 DIAGNOSIS — E222 Syndrome of inappropriate secretion of antidiuretic hormone: Secondary | ICD-10-CM | POA: Diagnosis present

## 2024-02-05 DIAGNOSIS — E875 Hyperkalemia: Secondary | ICD-10-CM | POA: Diagnosis not present

## 2024-02-05 DIAGNOSIS — E876 Hypokalemia: Secondary | ICD-10-CM | POA: Diagnosis present

## 2024-02-05 DIAGNOSIS — Z882 Allergy status to sulfonamides status: Secondary | ICD-10-CM | POA: Diagnosis not present

## 2024-02-05 DIAGNOSIS — R631 Polydipsia: Secondary | ICD-10-CM | POA: Diagnosis present

## 2024-02-05 DIAGNOSIS — E871 Hypo-osmolality and hyponatremia: Secondary | ICD-10-CM | POA: Diagnosis not present

## 2024-02-05 DIAGNOSIS — Z79899 Other long term (current) drug therapy: Secondary | ICD-10-CM | POA: Diagnosis not present

## 2024-02-05 DIAGNOSIS — Z881 Allergy status to other antibiotic agents status: Secondary | ICD-10-CM | POA: Diagnosis not present

## 2024-02-05 DIAGNOSIS — E782 Mixed hyperlipidemia: Secondary | ICD-10-CM | POA: Diagnosis present

## 2024-02-05 DIAGNOSIS — Z87891 Personal history of nicotine dependence: Secondary | ICD-10-CM | POA: Diagnosis not present

## 2024-02-05 DIAGNOSIS — Z23 Encounter for immunization: Secondary | ICD-10-CM | POA: Diagnosis present

## 2024-02-05 DIAGNOSIS — Z91011 Allergy to milk products: Secondary | ICD-10-CM | POA: Diagnosis not present

## 2024-02-05 DIAGNOSIS — I959 Hypotension, unspecified: Secondary | ICD-10-CM | POA: Diagnosis not present

## 2024-02-05 DIAGNOSIS — F411 Generalized anxiety disorder: Secondary | ICD-10-CM | POA: Diagnosis present

## 2024-02-05 DIAGNOSIS — Z888 Allergy status to other drugs, medicaments and biological substances status: Secondary | ICD-10-CM | POA: Diagnosis not present

## 2024-02-05 DIAGNOSIS — I10 Essential (primary) hypertension: Secondary | ICD-10-CM | POA: Diagnosis present

## 2024-02-05 DIAGNOSIS — G4700 Insomnia, unspecified: Secondary | ICD-10-CM | POA: Diagnosis present

## 2024-02-05 LAB — CBC WITH DIFFERENTIAL/PLATELET
Abs Immature Granulocytes: 0.02 K/uL (ref 0.00–0.07)
Basophils Absolute: 0 K/uL (ref 0.0–0.1)
Basophils Relative: 1 %
Eosinophils Absolute: 0.1 K/uL (ref 0.0–0.5)
Eosinophils Relative: 3 %
Hemoglobin: 13.3 g/dL (ref 13.0–17.0)
Immature Granulocytes: 0 %
Lymphocytes Relative: 34 %
Lymphs Abs: 1.8 K/uL (ref 0.7–4.0)
Monocytes Absolute: 0.8 K/uL (ref 0.1–1.0)
Monocytes Relative: 14 %
Neutro Abs: 2.5 K/uL (ref 1.7–7.7)
Neutrophils Relative %: 48 %
Platelets: 277 K/uL (ref 150–400)
Smear Review: NORMAL
WBC: 5.3 K/uL (ref 4.0–10.5)

## 2024-02-05 LAB — COMPREHENSIVE METABOLIC PANEL WITH GFR
ALT: 19 U/L (ref 0–44)
AST: 27 U/L (ref 15–41)
Albumin: 3.8 g/dL (ref 3.5–5.0)
Alkaline Phosphatase: 50 U/L (ref 38–126)
Anion gap: 8 (ref 5–15)
BUN: 14 mg/dL (ref 6–20)
CO2: 26 mmol/L (ref 22–32)
Calcium: 8.5 mg/dL — ABNORMAL LOW (ref 8.9–10.3)
Chloride: 89 mmol/L — ABNORMAL LOW (ref 98–111)
Creatinine, Ser: 0.64 mg/dL (ref 0.61–1.24)
GFR, Estimated: >60 mL/min (ref >60)
Glucose, Bld: 103 mg/dL — ABNORMAL HIGH (ref 70–99)
Potassium: 3.1 mmol/L — ABNORMAL LOW (ref 3.5–5.1)
Sodium: 123 mmol/L — ABNORMAL LOW (ref 135–145)
Total Bilirubin: 1 mg/dL (ref 0.0–1.2)
Total Protein: 6.1 g/dL — ABNORMAL LOW (ref 6.5–8.1)

## 2024-02-05 LAB — TSH: TSH: 2.842 u[IU]/mL (ref 0.350–4.500)

## 2024-02-05 LAB — LACTIC ACID, PLASMA
Lactic Acid, Venous: 1 mmol/L (ref 0.5–1.9)
Lactic Acid, Venous: 1.5 mmol/L (ref 0.5–1.9)

## 2024-02-05 LAB — OSMOLALITY: Osmolality: 259 mosm/kg — ABNORMAL LOW (ref 275–295)

## 2024-02-05 LAB — CORTISOL-AM, BLOOD: Cortisol - AM: 12.3 ug/dL (ref 6.7–22.6)

## 2024-02-05 LAB — OSMOLALITY, URINE: Osmolality, Ur: 159 mosm/kg — ABNORMAL LOW (ref 300–900)

## 2024-02-05 LAB — MAGNESIUM: Magnesium: 2 mg/dL (ref 1.7–2.4)

## 2024-02-05 LAB — PROCALCITONIN: Procalcitonin: <0.1 ng/mL

## 2024-02-05 LAB — SODIUM, URINE, RANDOM: Sodium, Ur: 37 mmol/L

## 2024-02-05 MED ORDER — LACTULOSE 10 GM/15ML PO SOLN
30.0000 g | Freq: Once | ORAL | Status: AC
  Administered 2024-02-05: 30 g via ORAL
  Filled 2024-02-05: qty 60

## 2024-02-05 MED ORDER — HYDROCORTISONE SOD SUC (PF) 100 MG IJ SOLR
100.0000 mg | Freq: Once | INTRAMUSCULAR | Status: AC
  Administered 2024-02-05: 100 mg via INTRAVENOUS
  Filled 2024-02-05: qty 2

## 2024-02-05 MED ORDER — QUETIAPINE FUMARATE 300 MG PO TABS
700.0000 mg | ORAL_TABLET | Freq: Every day | ORAL | Status: DC
  Administered 2024-02-05: 700 mg via ORAL
  Filled 2024-02-05: qty 1

## 2024-02-05 MED ORDER — POTASSIUM CHLORIDE CRYS ER 20 MEQ PO TBCR
80.0000 meq | EXTENDED_RELEASE_TABLET | Freq: Once | ORAL | Status: AC
  Administered 2024-02-05: 80 meq via ORAL
  Filled 2024-02-05: qty 4

## 2024-02-05 MED ORDER — FLEET ENEMA RE ENEM
1.0000 | ENEMA | Freq: Once | RECTAL | Status: AC
  Administered 2024-02-05: 1 via RECTAL

## 2024-02-05 MED ORDER — SODIUM CHLORIDE 1 G PO TABS
1.0000 g | ORAL_TABLET | Freq: Three times a day (TID) | ORAL | Status: DC
  Administered 2024-02-05 – 2024-02-06 (×4): 1 g via ORAL
  Filled 2024-02-05 (×4): qty 1

## 2024-02-05 MED ORDER — SODIUM CHLORIDE 0.9 % IV BOLUS (SEPSIS)
1000.0000 mL | Freq: Once | INTRAVENOUS | Status: AC
  Administered 2024-02-05: 1000 mL via INTRAVENOUS

## 2024-02-05 MED ORDER — SODIUM CHLORIDE 0.9 % IV BOLUS
500.0000 mL | Freq: Once | INTRAVENOUS | Status: AC
  Administered 2024-02-05: 500 mL via INTRAVENOUS

## 2024-02-05 MED ORDER — BUSPIRONE HCL 15 MG PO TABS
7.5000 mg | ORAL_TABLET | Freq: Two times a day (BID) | ORAL | Status: DC
  Administered 2024-02-05 – 2024-02-06 (×3): 7.5 mg via ORAL
  Filled 2024-02-05 (×3): qty 1

## 2024-02-05 MED ORDER — MIDODRINE HCL 5 MG PO TABS
10.0000 mg | ORAL_TABLET | Freq: Once | ORAL | Status: AC
Start: 2024-02-05 — End: 2024-02-05
  Administered 2024-02-05: 10 mg via ORAL
  Filled 2024-02-05: qty 2

## 2024-02-05 MED ORDER — LACTATED RINGERS IV SOLN: 150.0000 mL/h | INTRAVENOUS | Status: DC

## 2024-02-05 NOTE — Progress Notes (Addendum)
 Met with patient at bedside. Patient appears disheveled and unkept. Introduced patient to role of Statistician. Intake questions completed. Patient does have a current primary care physician, who also does med management for his mental health medications. When asked about a mental health provider, patient states I can't ever get to the appointments or anything. Would likely benefit from ACTT team.  Patient states he is being evicted from his apartment at [address] [place], [place]. The apartment is managed by the [employer]. Patient unsure if he is being evicted for failing inspections or if he is being evicted for being behind on rent. States he missed his court hearing in August, but he appealed to have a jury trial. Patient does not currently have an assigned attorney, and is unsure how to get one. Information for legal aide provided. Patient does have a history of homelessness and is very anxious about that becoming a possibility again. Court date scheduled for December 15th.  Patient does receive an SSDI check monthly.  Reports transportation issues. Provided with medicaid transport number.  Patient reports a high level of anxiety and worry affecting his daily activities and ability to function. Patient denies SI/HI. Would consider group home, if eviction goes through.  Encouraged to call with questions or concerns.

## 2024-02-05 NOTE — Progress Notes (Addendum)
 CROSS COVER NOTE  NAME: Jeremiah [name] MRN: [id number] DOB : 14-Jun-1965    Concern as stated by nurse / staff   Pt. admitted today for observation for Hypokalemia and Hyponatremia. BP soft 82/58 (map: 65), pulse 52, Temp 97.6, 02 98% (Room air)      Pertinent findings on chart review:   Patient Assessment    02/05/2024    2:40 AM 02/05/2024    1:08 AM 02/05/2024   12:13 AM  Vitals with BMI  Systolic 84 80 82  Diastolic 52 52 58  Pulse   52  Physical Exam Vitals and nursing note reviewed.  Constitutional:      General: He is not in acute distress.    Comments: Patient awake and alert oriented x 4 and mentating well  HENT:     Head: Normocephalic and atraumatic.  Cardiovascular:     Rate and Rhythm: Normal rate and regular rhythm.     Heart sounds: Normal heart sounds.  Pulmonary:     Effort: Pulmonary effort is normal.     Breath sounds: Normal breath sounds.  Abdominal:     Palpations: Abdomen is soft.     Tenderness: There is no abdominal tenderness.  Neurological:     Mental Status: Mental status is at baseline.       workup Lactic Acid, Venous    Component Value Date/Time   LATICACIDVEN 1.0 02/05/2024 0313      Latest Ref Rng & Units 02/05/2024    3:12 AM 02/04/2024   10:30 AM 01/10/2024    5:42 PM  CBC  WBC 4.0 - 10.5 K/uL 5.3  5.6  7.1   Hemoglobin 13.0 - 17.0 g/dL 86.6  85.3  84.3   Hematocrit 39.0 - 52.0 % RESULTS UNAVAILABLE DUE TO INTERFERING SUBSTANCE  RESULTS UNAVAILABLE DUE TO INTERFERING SUBSTANCE  RESULTS UNAVAILABLE DUE TO INTERFERING SUBSTANCE   Platelets 150 - 400 K/uL 277  322  351       Latest Ref Rng & Units 02/05/2024    3:12 AM 02/04/2024   10:30 AM 01/22/2024    2:14 PM  CMP  Glucose 70 - 99 mg/dL 896  888  896   BUN 6 - 20 mg/dL 14  12  13    Creatinine 0.61 - 1.24 mg/dL 9.35  9.28  9.05   Sodium 135 - 145 mmol/L 123  122  138   Potassium 3.5 - 5.1 mmol/L 3.1  2.8  3.9   Chloride 98 - 111 mmol/L 89  85  99   CO2 22 - 32  mmol/L 26  25  23    Calcium  8.9 - 10.3 mg/dL 8.5  9.2  89.8   Total Protein 6.5 - 8.1 g/dL 6.1  6.9    Total Bilirubin 0.0 - 1.2 mg/dL 1.0  1.4    Alkaline Phos 38 - 126 U/L 50  51    AST 15 - 41 U/L 27  39    ALT 0 - 44 U/L 19  23       Assessment and  Interventions   Assessment:  Hypotension without other SIRS criteria  Plan: Not initially fluid responsive so repeating bolus and giving a dose of midodrine  Out of caution we will do a sepsis workup given recent dog bite--> ruling out Hold home antihypertensives Continue close monitoring AM cortisol Trial of hydrocortisone       CRITICAL CARE Performed by: Delayne LULLA Solian   Total critical care  time: 85 minutes  Critical care time was exclusive of separately billable procedures and treating other patients.  Critical care was necessary to treat or prevent imminent or life-threatening deterioration.  Critical care was time spent personally by me on the following activities: development of treatment plan with patient and/or surrogate as well as nursing, discussions with consultants, evaluation of patient's response to treatment, examination of patient, obtaining history from patient or surrogate, ordering and performing treatments and interventions, ordering and review of laboratory studies, ordering and review of radiographic studies, pulse oximetry and re-evaluation of patient's condition.

## 2024-02-05 NOTE — Progress Notes (Signed)
 PROGRESS NOTE    MITHRAN STRIKE  FMW:[id number] DOB: [birth year] DOA: 02/04/2024 PCP: Albina GORMAN Dine, MD      Brief Narrative:   From admission h and p [name] is a 58 year old male with history of schizophrenia, insomnia, hypertension, hyperlipidemia, who presents emergency department for chief concerns of abdominal pain and constipation, insomnia for about 1 week.   Vitals in the ED T 98, rr 19, hr 64, blood pressure 126/87, SpO2 100% on room air.   Serum sodium is 122, potassium 2.8, chloride 85, bicarb 25, BUN of 12, serum creatinine 0.71, eGFR greater than 60, nonfasting glucose 111, WBC 5.6, hemoglobin 14.6, platelets of 322.   UA was positive for moderate hemoglobin and negative for bacteria, leukocytes, nitrates, glucose.   ED treatment: Versed  2 mg IV one-time dose, potassium chloride  40 mEq p.o. one-time dose, potassium chloride  10 mEq IV one-time dose. --------------------------------------- At bedside, patient able to tell me his first and last name, age, location, current calendar year.   He reports that over the last week, he has not been sleeping at all.  He reports that 3 weeks ago he was bitten by a dog.  He reports he has not been feeling well.  He reports he does not know why his electrolytes were abnormal.   Assessment & Plan:   Principal Problem:   Hypokalemia Active Problems:   Hyponatremia   GAD (generalized anxiety disorder)   OSA (obstructive sleep apnea)   Schizo-affective psychosis (HCC)   Hypertension   History of hypogonadism   Mixed hyperlipidemia   Insomnia   Dog bite   Major depressive disorder, recurrent episode, severe, with psychosis (HCC)   Constipation   Polypharmacy  # Hyperkalemia Likely 2/2 chlorthalidone , denies vomiting/diarrhea. Mg wnl - hold chlorthalidone  - replete and monitor  # Hyponatremia Likely multifactorial. Normal sodium up until last month when found to be low and has stayed mid to low 120s. Home  chlorthalidone  likely contributes but has been on that for a while. Antipsychotics likely causing siadh - f/u tsh and am cortisol - hold chlorthalidone  - discontinue fluids, does not appear hypovolemic and this will only worsen siadh - fluid restrict 1.8 liters - start salt tabs - urine studies  # Constipation Reports no bm for a week. Got miralax  yesterday - lactulose  and enema today  # Schizoaffective disorder Recent hospitalization for depressive symptoms. Currently reports modest control of symptoms, no hi/si. - continue home buspar , escitalopram , seroquel   # Hypertension Hypotensive overnight, asymptomatic - home meds on hold - f/u blood cultures, cortisol  # OSA - cpap qhs   DVT prophylaxis: lovenox Code Status: full Family Communication: none at bedside  Level of care: Telemetry Medical Status is: Observation    Consultants:  none  Procedures: none  Antimicrobials:  none    Subjective: Reports feeling run down.  Objective: Vitals:   02/05/24 0108 02/05/24 0240 02/05/24 0420 02/05/24 0736  BP: (!) 80/52 (!) 84/52 (!) 74/54 107/74  Pulse:    (!) 56  Resp:    16  Temp:    98.7 F (37.1 C)  TempSrc:    Oral  SpO2:    97%  Weight:      Height:        Intake/Output Summary (Last 24 hours) at 02/05/2024 0858 Last data filed at 02/04/2024 2123 Gross per 24 hour  Intake 755.26 ml  Output --  Net 755.26 ml   Filed Weights   02/04/24 1030 02/05/24 0011  Weight: 79.6 kg 79.8 kg    Examination:  General exam: Appears comfortable  Respiratory system: Clear to auscultation. Respiratory effort normal. Cardiovascular system: S1 & S2 heard, RRR.   Gastrointestinal system: Abdomen is nondistended, soft and nontender.   Central nervous system: Alert and oriented. No focal neurological deficits. Extremities: Symmetric 5 x 5 power. Skin: No rashes, lesions or ulcers Psychiatry: appears anxious, dysthymic    Data Reviewed: I have personally reviewed  following labs and imaging studies  CBC: Recent Labs  Lab 02/04/24 1030 02/05/24 0312  WBC 5.6 5.3  NEUTROABS  --  2.5  HGB 14.6 13.3  HCT RESULTS UNAVAILABLE DUE TO INTERFERING SUBSTANCE RESULTS UNAVAILABLE DUE TO INTERFERING SUBSTANCE  MCV RESULTS UNAVAILABLE DUE TO INTERFERING SUBSTANCE RESULTS UNAVAILABLE DUE TO INTERFERING SUBSTANCE  PLT 322 277   Basic Metabolic Panel: Recent Labs  Lab 02/04/24 1030 02/05/24 0312  NA 122* 123*  K 2.8* 3.1*  CL 85* 89*  CO2 25 26  GLUCOSE 111* 103*  BUN 12 14  CREATININE 0.71 0.64  CALCIUM  9.2 8.5*  MG 1.8 2.0   GFR: Estimated Creatinine Clearance: 101.2 mL/min (by C-G formula based on SCr of 0.64 mg/dL). Liver Function Tests: Recent Labs  Lab 02/04/24 1030 02/05/24 0312  AST 39 27  ALT 23 19  ALKPHOS 51 50  BILITOT 1.4* 1.0  PROT 6.9 6.1*  ALBUMIN 4.2 3.8   Recent Labs  Lab 02/04/24 1030  LIPASE 43   No results for input(s): AMMONIA in the last 168 hours. Coagulation Profile: No results for input(s): INR, PROTIME in the last 168 hours. Cardiac Enzymes: No results for input(s): CKTOTAL, CKMB, CKMBINDEX, TROPONINI in the last 168 hours. BNP (last 3 results) No results for input(s): PROBNP in the last 8760 hours. HbA1C: No results for input(s): HGBA1C in the last 72 hours. CBG: Recent Labs  Lab 02/04/24 2140  GLUCAP 131*   Lipid Profile: No results for input(s): CHOL, HDL, LDLCALC, TRIG, CHOLHDL, LDLDIRECT in the last 72 hours. Thyroid  Function Tests: No results for input(s): TSH, T4TOTAL, FREET4, T3FREE, THYROIDAB in the last 72 hours. Anemia Panel: No results for input(s): VITAMINB12, FOLATE, FERRITIN, TIBC, IRON, RETICCTPCT in the last 72 hours. Urine analysis:    Component Value Date/Time   COLORURINE YELLOW (A) 02/04/2024 1031   APPEARANCEUR CLEAR (A) 02/04/2024 1031   APPEARANCEUR Clear 12/14/2014 1017   LABSPEC 1.008 02/04/2024 1031   LABSPEC  1.002 08/20/2014 2122   PHURINE 7.0 02/04/2024 1031   GLUCOSEU NEGATIVE 02/04/2024 1031   GLUCOSEU Negative 08/20/2014 2122   HGBUR MODERATE (A) 02/04/2024 1031   BILIRUBINUR NEGATIVE 02/04/2024 1031   BILIRUBINUR Negative 12/14/2014 1017   BILIRUBINUR Negative 08/20/2014 2122   KETONESUR NEGATIVE 02/04/2024 1031   PROTEINUR NEGATIVE 02/04/2024 1031   UROBILINOGEN 0.2 10/15/2014 1816   NITRITE NEGATIVE 02/04/2024 1031   LEUKOCYTESUR NEGATIVE 02/04/2024 1031   LEUKOCYTESUR Negative 08/20/2014 2122   Sepsis Labs: @LABRCNTIP (procalcitonin:4,lacticidven:4)  ) Recent Results (from the past 240 hours)  Culture, blood (x 2)     Status: None (Preliminary result)   Collection Time: 02/05/24  3:13 AM   Specimen: BLOOD RIGHT ARM  Result Value Ref Range Status   Specimen Description BLOOD RIGHT ARM  Final   Special Requests   Final    BOTTLES DRAWN AEROBIC AND ANAEROBIC Blood Culture adequate volume   Culture   Final    NO GROWTH < 12 HOURS Performed at [facility], [address], [place], [place] [address]  Report Status PENDING  Incomplete  Culture, blood (x 2)     Status: None (Preliminary result)   Collection Time: 02/05/24  3:13 AM   Specimen: BLOOD RIGHT HAND  Result Value Ref Range Status   Specimen Description BLOOD RIGHT HAND  Final   Special Requests   Final    BOTTLES DRAWN AEROBIC AND ANAEROBIC Blood Culture adequate volume   Culture   Final    NO GROWTH < 12 HOURS Performed at [facility], [address], [place], [place] [address]    Report Status PENDING  Incomplete         Radiology Studies: No results found.      Scheduled Meds:  atorvastatin   20 mg Oral QHS   escitalopram   10 mg Oral Daily   heparin   5,000 Units Subcutaneous Q8H   melatonin  5 mg Oral QHS   pantoprazole   40 mg Oral BID   potassium chloride   80 mEq Oral Once   QUEtiapine   200 mg Oral QHS   senna-docusate  1 tablet Oral BID   Continuous Infusions:   promethazine (PHENERGAN) injection (IM or IVPB)       LOS: 0 days     Devaughn KATHEE Ban, MD [facility]   If 7PM-7AM, please contact night-coverage [email or web address] Password [facility] 02/05/2024, 8:58 AM

## 2024-02-05 NOTE — Discharge Instructions (Signed)
 Your nurse navigator, Martavious Hartel, can be reached at [phone]  [facility] Transportation  [phone]     Schedule 3-4 days in advance for transportation to/from medical appointments  Legal Aid  [phone]     Assists with civil matters

## 2024-02-05 NOTE — Progress Notes (Signed)
 Per Dr Kandis, patient may shower, dc tele monitoring

## 2024-02-05 NOTE — Plan of Care (Signed)
  Problem: Education: Goal: Knowledge of General Education information will improve Description: Including pain rating scale, medication(s)/side effects and non-pharmacologic comfort measures Outcome: Progressing   Problem: Clinical Measurements: Goal: Ability to maintain clinical measurements within normal limits will improve Outcome: Progressing Goal: Will remain free from infection Outcome: Progressing Goal: Diagnostic test results will improve Outcome: Progressing Goal: Respiratory complications will improve Outcome: Progressing Goal: Cardiovascular complication will be avoided Outcome: Progressing   Problem: Activity: Goal: Risk for activity intolerance will decrease Outcome: Progressing   Problem: Nutrition: Goal: Adequate nutrition will be maintained Outcome: Progressing   Problem: Coping: Goal: Level of anxiety will decrease Outcome: Progressing   Problem: Elimination: Goal: Will not experience complications related to bowel motility Outcome: Progressing Goal: Will not experience complications related to urinary retention Outcome: Progressing   Problem: Pain Managment: Goal: General experience of comfort will improve and/or be controlled Outcome: Progressing   Problem: Safety: Goal: Ability to remain free from injury will improve Outcome: Progressing   Problem: Skin Integrity: Goal: Risk for impaired skin integrity will decrease Outcome: Progressing   Problem: Fluid Volume: Goal: Hemodynamic stability will improve Outcome: Progressing   Problem: Clinical Measurements: Goal: Diagnostic test results will improve Outcome: Progressing Goal: Signs and symptoms of infection will decrease Outcome: Progressing   Problem: Respiratory: Goal: Ability to maintain adequate ventilation will improve Outcome: Progressing   Problem: Health Behavior/Discharge Planning: Goal: Ability to manage health-related needs will improve Outcome: Not Progressing

## 2024-02-06 DIAGNOSIS — I10 Essential (primary) hypertension: Secondary | ICD-10-CM

## 2024-02-06 DIAGNOSIS — E876 Hypokalemia: Secondary | ICD-10-CM | POA: Diagnosis not present

## 2024-02-06 DIAGNOSIS — G4733 Obstructive sleep apnea (adult) (pediatric): Secondary | ICD-10-CM

## 2024-02-06 DIAGNOSIS — E782 Mixed hyperlipidemia: Secondary | ICD-10-CM

## 2024-02-06 DIAGNOSIS — F411 Generalized anxiety disorder: Secondary | ICD-10-CM | POA: Diagnosis not present

## 2024-02-06 DIAGNOSIS — F250 Schizoaffective disorder, bipolar type: Secondary | ICD-10-CM

## 2024-02-06 DIAGNOSIS — E871 Hypo-osmolality and hyponatremia: Secondary | ICD-10-CM

## 2024-02-06 DIAGNOSIS — Z8639 Personal history of other endocrine, nutritional and metabolic disease: Secondary | ICD-10-CM

## 2024-02-06 DIAGNOSIS — K5900 Constipation, unspecified: Secondary | ICD-10-CM

## 2024-02-06 DIAGNOSIS — Z79899 Other long term (current) drug therapy: Secondary | ICD-10-CM

## 2024-02-06 DIAGNOSIS — G4700 Insomnia, unspecified: Secondary | ICD-10-CM

## 2024-02-06 LAB — MAGNESIUM: Magnesium: 2.3 mg/dL (ref 1.7–2.4)

## 2024-02-06 LAB — BASIC METABOLIC PANEL WITH GFR
Anion gap: 8 (ref 5–15)
BUN: 10 mg/dL (ref 6–20)
CO2: 26 mmol/L (ref 22–32)
Calcium: 8.7 mg/dL — ABNORMAL LOW (ref 8.9–10.3)
Chloride: 99 mmol/L (ref 98–111)
Creatinine, Ser: 0.64 mg/dL (ref 0.61–1.24)
GFR, Estimated: >60 mL/min (ref >60)
Glucose, Bld: 99 mg/dL (ref 70–99)
Potassium: 3.3 mmol/L — ABNORMAL LOW (ref 3.5–5.1)
Sodium: 131 mmol/L — ABNORMAL LOW (ref 135–145)

## 2024-02-06 MED ORDER — POTASSIUM CHLORIDE CRYS ER 20 MEQ PO TBCR
40.0000 meq | EXTENDED_RELEASE_TABLET | Freq: Once | ORAL | Status: AC
  Administered 2024-02-06: 40 meq via ORAL
  Filled 2024-02-06: qty 2

## 2024-02-06 MED ORDER — LACTULOSE 10 GM/15ML PO SOLN
30.0000 g | Freq: Two times a day (BID) | ORAL | Status: DC
  Administered 2024-02-06: 30 g via ORAL
  Filled 2024-02-06: qty 60

## 2024-02-06 MED ORDER — SODIUM CHLORIDE 1 G PO TABS: 1.0000 g | ORAL_TABLET | Freq: Three times a day (TID) | ORAL | 1 refills | Status: AC

## 2024-02-06 MED ORDER — LACTULOSE 10 GM/15ML PO SOLN: 30.0000 g | Freq: Two times a day (BID) | ORAL | 0 refills | Status: AC | PRN

## 2024-02-06 NOTE — Progress Notes (Signed)
   02/06/24 1100  Spiritual Encounters  Type of Visit Initial  Care provided to: Patient  OnCall Visit Yes  Interventions  Spiritual Care Interventions Made Established relationship of care and support;Compassionate presence  Intervention Outcomes  Outcomes Connection to spiritual care   Patient was resting and asked the Chaplain to check back later. Chaplain will follow up.

## 2024-02-06 NOTE — Discharge Summary (Signed)
 Physician Discharge Summary   [name] MRN: [id number] DOB: 09/27/1965  Admit date:     02/04/2024  Discharge date: 02/06/24  Discharge Physician: Amaryllis Dare   PCP: Albina GORMAN Dine, MD   Recommendations at discharge:  Please obtain CBC and BMP on follow-up-patient can stop taking salt tablets if sodium stabilizes.  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 Holding home antihypertensives due to borderline soft blood pressure and borderline bradycardia.  Home amlodipine  and carvedilol  can be restarted when appropriate. Follow-up with primary care provider within a week  Discharge Diagnoses: Principal Problem:   Hypokalemia Active Problems:   Hyponatremia   GAD (generalized anxiety disorder)   OSA (obstructive sleep apnea)   Schizo-affective psychosis (HCC)   Hypertension   History of hypogonadism   Mixed hyperlipidemia   Insomnia   Dog bite   Major depressive disorder, recurrent episode, severe, with psychosis (HCC)   Constipation   Polypharmacy   Hospital Course: [name] is a 58 year old male with history of schizophrenia, insomnia, hypertension, hyperlipidemia, who presents emergency department for chief concerns of abdominal pain and constipation, insomnia for about 1 week.  Vitals in the ED T 98, rr 19, hr 64, blood pressure 126/87, SpO2 100% on room air.  Serum sodium is 122, potassium 2.8, chloride 85, bicarb 25, BUN of 12, serum creatinine 0.71, eGFR greater than 60, nonfasting glucose 111, WBC 5.6, hemoglobin 14.6, platelets of 322.  UA was positive for moderate hemoglobin and negative for bacteria, leukocytes, nitrates, glucose.  Patient was admitted for hyponatremia and hypokalemia.  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  TSH and a.m. cortisol was normal.  Patient was started on fluid restriction and salt tablets, sodium improved  to 133 on the day of discharge.  Patient was given some salt tablets and need to have a close follow-up with PCP for further assistance.  Potassium was repleted before discharge.  Magnesium  was normal.  Patient was found to have borderline soft blood pressure which was within goal without any antihypertensives, his home amlodipine  and carvedilol  was also held.  Patient has borderline bradycardia and hypotension on admission.  He need close follow-up with PCP and they can restart blood pressure medications as needed.  Constipation improved with bowel regimen, he was given lactulose  to be used twice daily as needed.  Patient will continue with his rest of the home medications which were mostly psych medications and need to have a close follow-up with his psychiatrist for further assistance.  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  TSH and a.m. cortisol was normal. - Fluid restriction with salt tablet  Schizo-affective psychosis (HCC) Continuing home medications  OSA (obstructive sleep apnea) CPAP nightly ordered  GAD (generalized anxiety disorder) PDMP reviewed Home hydroxyzine  25 mg daily as needed for sleep, anxiety resumed Current active prescription includes temazepam  15 mg written on 12/17/2023 and filled on 01/14/2024, 30-day supply Lorazepam  2 mg p.o. every 6 hours as needed for anxiety, 2 doses were  Dog bite Status post tetanus shot I evaluated his picture from 3 weeks ago, it appears that the skin is intact Patient states that he feels it in his bone.  I looked at the picture that he took once again  and there is no indication of skin breakage  Insomnia Continue home medication, can use as needed  melatonin  Mixed hyperlipidemia Home atorvastatin  25 mg nightly resume  Hypertension Blood pressure now within goal, due to borderline soft blood pressure and borderline tachycardia Home amlodipine  and carvedilol  was held. Chlorthalidone  was discontinued.  Constipation Resolved with bowel regimen.  Major depressive disorder, recurrent episode, severe, with psychosis (HCC) Home escitalopram  10 mg daily resumed for 02/05/2024  Consultants: None Procedures performed: None Disposition: Home Diet recommendation:  Discharge Diet Orders (From admission, onward)     Start     Ordered   02/06/24 0000  Diet - low sodium heart healthy        02/06/24 1109           Regular diet DISCHARGE MEDICATION: Allergies as of 02/06/2024       Reactions   Diflucan  [fluconazole ] Diarrhea   Ciprofloxacin  Other (See Comments)   GI distress   Other    Other reaction(s): Other (See Comments) GI problems   Pepto-bismol [bismuth Subsalicylate] Other (See Comments)   Bad reaction   Trazodone  And Nefazodone Other (See Comments)   Burn tongue and causes blisters   Elemental Sulfur Other (See Comments)   GI distress         Medication List     PAUSE taking these medications    amLODipine  10 MG tablet Wait to take this until your doctor or other care provider tells you to start again. Commonly known as: NORVASC  Take 1 tablet (10 mg total) by mouth daily.   carvedilol  25 MG tablet Wait to take this until your doctor or other care provider tells you to start again. Commonly known as: COREG  Take 2 tablets (50 mg total) by mouth 2 (two) times daily with a meal. TAKE TWO TABLETS BY MOUTH TWO TIMES A DAY What changed:  how much to take when to take this       STOP taking these medications    baclofen 10 MG tablet Commonly known as: LIORESAL   chlorthalidone  25 MG tablet Commonly known as: HYGROTON    temazepam  15 MG capsule Commonly known as: RESTORIL        TAKE these  medications    atorvastatin  20 MG tablet Commonly known as: LIPITOR TAKE ONE TABLET BY MOUTH EVERY NIGHT AT BEDTIME   busPIRone  7.5 MG tablet Commonly known as: BUSPAR  TAKE ONE TABLET (7.5 MG TOTAL) BY MOUTH TWO TIMES DAILY. What changed: See the new instructions.   cyanocobalamin  1000 MCG/ML injection Commonly known as: VITAMIN B12 Inject 1,000 mcg into the muscle.   escitalopram  10 MG tablet Commonly known as: LEXAPRO  Take 10 mg by mouth.   hydrOXYzine  25 MG tablet Commonly known as: ATARAX  Take 1 tablet (25 mg total) by mouth daily as needed for anxiety (sleep).   Jublia 10 % Soln Generic drug: Efinaconazole APPLY TO AFFECTED TOENAIL DAILY, COVERING THE TOE, NAIL BED, AND SURROUNDING SKIN ETC.   lactulose  10 GM/15ML solution Commonly known as: CHRONULAC  Take 45 mLs (30 g total) by mouth 2 (two) times daily as needed for mild constipation or moderate constipation.   melatonin 5 MG Tabs Take 1 tablet (5 mg total) by mouth at bedtime.   QUEtiapine  300 MG tablet Commonly known as: SEROQUEL  TAKE TWO TABLETS BY MOUTH AT BEDTIME WITH 100 MG TABLETS. **NEED APPT** What changed:  medication strength See the new instructions.   QUEtiapine  100 MG tablet Commonly known as: SEROQUEL  TAKE ONE TABLET BY MOUTH  EVERY NIGHT AT BEDTIME WITH 300MG  FOR A TOTAL OF 700MG  What changed: You were already taking a medication with the same name, and this prescription was added. Make sure you understand how and when to take each.   sodium chloride  1 g tablet Take 1 tablet (1 g total) by mouth 3 (three) times daily with meals.   Testosterone  1.62 % Gel SMARTSIG:40.5 Milligram(s) Topical Every Morning        Follow-up Information     Albina GORMAN Dine, MD Follow up.   Specialty: Internal Medicine Why: hospital follow up Contact information: [address] [place] [address] [phone]                Discharge Exam: Filed Weights   02/04/24 1030 02/05/24 0011   Weight: 79.6 kg 79.8 kg   General.  Well-developed gentleman, in no acute distress. Pulmonary.  Lungs clear bilaterally, normal respiratory effort. CV.  Regular rate and rhythm, no JVD, rub or murmur. Abdomen.  Soft, nontender, nondistended, BS positive. CNS.  Alert and oriented .  No focal neurologic deficit. Extremities.  No edema,  pulses intact and symmetrical.   Condition at discharge: stable  The results of significant diagnostics from this hospitalization (including imaging, microbiology, ancillary and laboratory) are listed below for reference.   Imaging Studies: No results found.  Microbiology: Results for orders placed or performed during the hospital encounter of 02/04/24  Culture, blood (x 2)     Status: None (Preliminary result)   Collection Time: 02/05/24  3:13 AM   Specimen: BLOOD RIGHT ARM  Result Value Ref Range Status   Specimen Description BLOOD RIGHT ARM  Final   Special Requests   Final    BOTTLES DRAWN AEROBIC AND ANAEROBIC Blood Culture adequate volume   Culture   Final    NO GROWTH 1 DAY Performed at [facility], [address] [place], [place] [address]    Report Status PENDING  Incomplete  Culture, blood (x 2)     Status: None (Preliminary result)   Collection Time: 02/05/24  3:13 AM   Specimen: BLOOD RIGHT HAND  Result Value Ref Range Status   Specimen Description BLOOD RIGHT HAND  Final   Special Requests   Final    BOTTLES DRAWN AEROBIC AND ANAEROBIC Blood Culture adequate volume   Culture   Final    NO GROWTH 1 DAY Performed at [facility], [address] [place], [place] [address]    Report Status PENDING  Incomplete    Labs: CBC: Recent Labs  Lab 02/04/24 1030 02/05/24 0312  WBC 5.6 5.3  NEUTROABS  --  2.5  HGB 14.6 13.3  HCT RESULTS UNAVAILABLE DUE TO INTERFERING SUBSTANCE RESULTS UNAVAILABLE DUE TO INTERFERING SUBSTANCE  MCV RESULTS UNAVAILABLE DUE TO INTERFERING SUBSTANCE RESULTS UNAVAILABLE DUE TO  INTERFERING SUBSTANCE  PLT 322 277   Basic Metabolic Panel: Recent Labs  Lab 02/04/24 1030 02/05/24 0312 02/06/24 0603 02/06/24 0604  NA 122* 123*  --  131*  K 2.8* 3.1*  --  3.3*  CL 85* 89*  --  99  CO2 25 26  --  26  GLUCOSE 111* 103*  --  99  BUN 12 14  --  10  CREATININE 0.71 0.64  --  0.64  CALCIUM  9.2 8.5*  --  8.7*  MG 1.8 2.0 2.3  --    Liver Function Tests: Recent Labs  Lab 02/04/24 1030 02/05/24 0312  AST 39 27  ALT 23 19  ALKPHOS 51 50  BILITOT 1.4* 1.0  PROT 6.9 6.1*  ALBUMIN 4.2 3.8   CBG: Recent Labs  Lab 02/04/24 2140  GLUCAP 131*    Discharge time spent: greater than 30 minutes.  This record has been created using Conservation officer, historic buildings. Errors have been sought and corrected,but may not always be located. Such creation errors do not reflect on the standard of care.   Signed: Amaryllis Dare, MD [facility] 02/06/2024

## 2024-02-07 ENCOUNTER — Other Ambulatory Visit: Payer: Self-pay | Admitting: Internal Medicine

## 2024-02-07 DIAGNOSIS — F319 Bipolar disorder, unspecified: Secondary | ICD-10-CM

## 2024-02-08 ENCOUNTER — Ambulatory Visit: Admitting: Internal Medicine

## 2024-02-08 ENCOUNTER — Emergency Department
Admission: EM | Admit: 2024-02-08 | Discharge: 2024-02-08 | Disposition: A | Discharge status: Home / Self Care | Attending: Emergency Medicine | Admitting: Emergency Medicine

## 2024-02-08 ENCOUNTER — Other Ambulatory Visit: Payer: Self-pay

## 2024-02-08 DIAGNOSIS — K5900 Constipation, unspecified: Secondary | ICD-10-CM | POA: Insufficient documentation

## 2024-02-08 DIAGNOSIS — I10 Essential (primary) hypertension: Secondary | ICD-10-CM | POA: Diagnosis not present

## 2024-02-08 DIAGNOSIS — R1084 Generalized abdominal pain: Secondary | ICD-10-CM | POA: Diagnosis not present

## 2024-02-08 LAB — COMPREHENSIVE METABOLIC PANEL WITH GFR
ALT: 21 U/L (ref 0–44)
AST: 31 U/L (ref 15–41)
Albumin: 4.8 g/dL (ref 3.5–5.0)
Alkaline Phosphatase: 62 U/L (ref 38–126)
Anion gap: 12 (ref 5–15)
BUN: 9 mg/dL (ref 6–20)
CO2: 26 mmol/L (ref 22–32)
Calcium: 10.4 mg/dL — ABNORMAL HIGH (ref 8.9–10.3)
Chloride: 93 mmol/L — ABNORMAL LOW (ref 98–111)
Creatinine, Ser: 0.62 mg/dL (ref 0.61–1.24)
GFR, Estimated: >60 mL/min (ref >60)
Glucose, Bld: 122 mg/dL — ABNORMAL HIGH (ref 70–99)
Potassium: 3.5 mmol/L (ref 3.5–5.1)
Sodium: 131 mmol/L — ABNORMAL LOW (ref 135–145)
Total Bilirubin: 0.9 mg/dL (ref 0.0–1.2)
Total Protein: 8 g/dL (ref 6.5–8.1)

## 2024-02-08 LAB — CBC
HCT: 42 % (ref 39.0–52.0)
Hemoglobin: 15.7 g/dL (ref 13.0–17.0)
MCH: 31.2 pg (ref 26.0–34.0)
MCHC: 37.4 g/dL — ABNORMAL HIGH (ref 30.0–36.0)
MCV: 83.3 fL (ref 80.0–100.0)
Platelets: 378 K/uL (ref 150–400)
RBC: 5.04 MIL/uL (ref 4.22–5.81)
RDW: 11.9 % (ref 11.5–15.5)
WBC: 7.1 K/uL (ref 4.0–10.5)
nRBC: 0 % (ref 0.0–0.2)

## 2024-02-08 MED ORDER — SENNOSIDES-DOCUSATE SODIUM 8.6-50 MG PO TABS: 2.0000 | ORAL_TABLET | Freq: Two times a day (BID) | ORAL | 0 refills | Status: AC

## 2024-02-08 MED ORDER — MAGNESIUM CITRATE PO SOLN
1.0000 | Freq: Once | ORAL | Status: AC
Start: 2024-02-08 — End: 2024-02-08
  Administered 2024-02-08: 1 via ORAL
  Filled 2024-02-08: qty 296

## 2024-02-08 MED ORDER — BISACODYL 10 MG RE SUPP: 10.0000 mg | RECTAL | 0 refills | Status: AC | PRN

## 2024-02-08 MED ORDER — POLYETHYLENE GLYCOL 3350 17 GM/SCOOP PO POWD: ORAL | 0 refills | Status: AC

## 2024-02-08 NOTE — ED Provider Notes (Signed)
 [facility] Provider Note    Event Date/Time   First MD Initiated Contact with Patient 02/08/24 [phone]     (approximate)   History   Chief Complaint: Abdominal Pain   HPI  Jeremiah [name] is a 58 y.o. male with a history of hypertension, hypokalemia, depression who comes ED complaining of hard painful bowel movements and constipation for the last 3 weeks.  Reports generalized abdominal discomfort.  No vomiting or fever.  He is eating and drinking okay.  No other acute complaints.        Past Medical History:  Diagnosis Date   Anxiety    Benign essential HTN 08/27/2014   Borderline personality disorder (HCC)    BP (high blood pressure) 01/16/2015   Difficulty urinating    Diverticulosis    Dysuria    External hemorrhoid    Gastroparesis    Hypertension    Hypokalemia    Major depression    Microscopic hematuria    Nicotine  addiction    Over weight    Rectal fistula    Schizophrenia [facility])    Stroke [facility])     Current Outpatient Rx   Order #: [id number] Class: Normal   Order #: [id number] Class: Normal   Order #: [id number] Class: Normal   [Paused] Order #: [id number] Class: Normal   Order #: [id number] Class: Normal   Order #: [id number] Class: Normal   [Paused] Order #: [id number] Class: Normal   Order #: [id number] Class: Historical Med   Order #: [id number] Class: Historical Med   Order #: [id number] Class: Normal   Order #: [id number] Class: Normal   Order #: [id number] Class: Normal   Order #: [id number] Class: Normal   Order #: [id number] Class: Normal   Order #: [id number] Class: Normal   Order #: [id number] Class: Normal   Order #: [id number] Class: Historical Med    Past Surgical History:  Procedure Laterality Date   BACK SURGERY     COLONOSCOPY     COLONOSCOPY WITH PROPOFOL  N/A 10/12/2014   Procedure: COLONOSCOPY WITH PROPOFOL ;  Surgeon: Deward CINDERELLA Piedmont, MD;  Location: [facility] ENDOSCOPY;  Service: Gastroenterology;  Laterality: N/A;   LUMBAR SPINE SURGERY      TONSILLECTOMY      Physical Exam   Triage Vital Signs: ED Triage Vitals  Encounter Vitals Group     BP 02/08/24 0946 (!) 125/97     Girls Systolic BP Percentile --      Girls Diastolic BP Percentile --      Boys Systolic BP Percentile --      Boys Diastolic BP Percentile --      Pulse Rate 02/08/24 0946 74     Resp 02/08/24 0946 18     Temp 02/08/24 0946 98 F (36.7 C)     Temp src --      SpO2 02/08/24 0946 98 %     Weight --      Height --      Head Circumference --      Peak Flow --      Pain Score 02/08/24 0945 8     Pain Loc --      Pain Education --      Exclude from Growth Chart --     Most recent vital signs: Vitals:   02/08/24 1030 02/08/24 1100  BP: (!) 145/99 (!) 155/107  Pulse: 66 72  Resp:  18  Temp:    SpO2: 100% 100%    General: Awake, no distress.  CV:  Good peripheral perfusion.  Regular rate rhythm  Resp:  Normal effort.  Abd:  No distention.  Soft nontender Other:  Moist oral mucosa   ED Results / Procedures / Treatments   Labs (all labs ordered are listed, but only abnormal results are displayed) Labs Reviewed  COMPREHENSIVE METABOLIC PANEL WITH GFR - Abnormal; Notable for the following components:      Result Value   Sodium 131 (*)    Chloride 93 (*)    Glucose, Bld 122 (*)    Calcium  10.4 (*)    All other components within normal limits  CBC - Abnormal; Notable for the following components:   MCHC 37.4 (*)    All other components within normal limits  URINALYSIS, ROUTINE W REFLEX MICROSCOPIC     EKG    RADIOLOGY    PROCEDURES:  Procedures   MEDICATIONS ORDERED IN ED: Medications  magnesium  citrate solution 1 Bottle (1 Bottle Oral Given 02/08/24 1109)     IMPRESSION / MDM / ASSESSMENT AND PLAN / ED COURSE  I reviewed the triage vital signs and the nursing notes.  DDx: Constipation, electrolyte derangement, UTI.  Doubt bowel obstruction, aortic aneurysm, dissection, diverticulitis, intra-abdominal abscess,  appendicitis  Patient's presentation is most consistent with acute presentation with potential threat to life or bodily function.  Patient presents with abdominal pain for the past 3 weeks in the context of constipation.  He is nontoxic, reassuring exam.  Will check labs, give magnesium  citrate   ----------------------------------------- 11:40 AM on 02/08/2024 ----------------------------------------- Labs reassuring, remains nontoxic, suitable for outpatient follow-up.      FINAL CLINICAL IMPRESSION(S) / ED DIAGNOSES   Final diagnoses:  Constipation, unspecified constipation type     Rx / DC Orders   ED Discharge Orders          Ordered    polyethylene glycol powder (GLYCOLAX /MIRALAX ) 17 GM/SCOOP powder        02/08/24 1139    bisacodyl  (DULCOLAX) 10 MG suppository  As needed        02/08/24 1139    senna-docusate (SENOKOT-S) 8.6-50 MG tablet  2 times daily        02/08/24 1139             Note:  This document was prepared using Dragon voice recognition software and may include unintentional dictation errors.   Viviann Pastor, MD 02/08/24 1140

## 2024-02-08 NOTE — ED Triage Notes (Signed)
 Pt comes with c/o belly pain and not able to sleep. Pt states no relief with meds.

## 2024-02-08 NOTE — Discharge Instructions (Signed)
 Your lab tests were all normal today. We have sent prescriptions for a laxative regimen to your pharmacy. Please take these as directed and follow up with your doctor.

## 2024-02-10 LAB — CULTURE, BLOOD (ROUTINE X 2)
Culture: NO GROWTH
Culture: NO GROWTH
Special Requests: ADEQUATE
Special Requests: ADEQUATE

## 2024-02-13 ENCOUNTER — Ambulatory Visit (INDEPENDENT_AMBULATORY_CARE_PROVIDER_SITE_OTHER): Admitting: Internal Medicine

## 2024-02-13 VITALS — BP 112/82 | HR 59 | Temp 96.8°F | Ht 65.0 in | Wt 168.4 lb

## 2024-02-13 DIAGNOSIS — E871 Hypo-osmolality and hyponatremia: Secondary | ICD-10-CM

## 2024-02-13 DIAGNOSIS — Z0130 Encounter for examination of blood pressure without abnormal findings: Secondary | ICD-10-CM

## 2024-02-13 DIAGNOSIS — G4700 Insomnia, unspecified: Secondary | ICD-10-CM

## 2024-02-13 DIAGNOSIS — F3113 Bipolar disorder, current episode manic without psychotic features, severe: Secondary | ICD-10-CM | POA: Diagnosis not present

## 2024-02-13 MED ORDER — OLANZAPINE 5 MG PO TABS: 5.0000 mg | ORAL_TABLET | Freq: Every day | ORAL | 0 refills | Status: AC

## 2024-02-13 MED ORDER — BELSOMRA 10 MG PO TABS: 10.0000 mg | ORAL_TABLET | Freq: Every day | ORAL | 2 refills | Status: AC

## 2024-02-13 NOTE — Progress Notes (Signed)
 Established Patient Office Visit  Subjective:  Patient ID: Jeremiah [name], male    DOB: 18-Aug-1965  Age: 58 y.o. MRN: [id number]  Chief Complaint  Patient presents with   Follow-up    Hospital follow up, difficulty using the bathroom     Hospital f/u for recurrent hyponatremia. BP still well controlled while antihypertensives held since discharge. C/o insomnia as he stopped taking Seroquel  and his mania has deteriorated.    No other concerns at this time.   Past Medical History:  Diagnosis Date   Anxiety    Benign essential HTN 08/27/2014   Borderline personality disorder (HCC)    BP (high blood pressure) 01/16/2015   Difficulty urinating    Diverticulosis    Dysuria    External hemorrhoid    Gastroparesis    Hypertension    Hypokalemia    Major depression    Microscopic hematuria    Nicotine  addiction    Over weight    Rectal fistula    Schizophrenia (HCC)    Stroke [facility])     Past Surgical History:  Procedure Laterality Date   BACK SURGERY     COLONOSCOPY     COLONOSCOPY WITH PROPOFOL  N/A 10/12/2014   Procedure: COLONOSCOPY WITH PROPOFOL ;  Surgeon: Deward CINDERELLA Piedmont, MD;  Location: [facility] ENDOSCOPY;  Service: Gastroenterology;  Laterality: N/A;   LUMBAR SPINE SURGERY     TONSILLECTOMY      Social History   Socioeconomic History   Marital status: Single    Spouse name: Not on file   Number of children: Not on file   Years of education: Not on file   Highest education level: Not on file  Occupational History   Not on file  Tobacco Use   Smoking status: Former    Current packs/day: 0.00    Types: Cigarettes    Quit date: 08/19/2014    Years since quitting: 9.4   Smokeless tobacco: Never  Substance and Sexual Activity   Alcohol use: No    Alcohol/week: 0.0 standard drinks of alcohol    Comment: last drink was in march 2016   Drug use: No   Sexual activity: Not Currently  Other Topics Concern   Not on file  Social History Narrative   Not on file   Social  Drivers of Health   Financial Resource Strain: Not on file  Food Insecurity: Food Insecurity Present (02/05/2024)   Hunger Vital Sign    Worried About Running Out of Food in the Last Year: Sometimes true    Ran Out of Food in the Last Year: Sometimes true  Transportation Needs: No Transportation Needs (02/05/2024)   PRAPARE - Administrator, Civil Service (Medical): No    Lack of Transportation (Non-Medical): No  Physical Activity: Not on file  Stress: Not on file  Social Connections: Not on file  Intimate Partner Violence: Not At Risk (02/05/2024)   Humiliation, Afraid, Rape, and Kick questionnaire    Fear of Current or Ex-Partner: No    Emotionally Abused: No    Physically Abused: No    Sexually Abused: No    Family History  Problem Relation Age of Onset   Stroke Mother    Stroke Maternal Grandmother    Diabetes Mellitus II Sister     Allergies  Allergen Reactions   Diflucan  [Fluconazole ] Diarrhea   Ciprofloxacin  Other (See Comments)    GI distress   Other     Other reaction(Jammy Stlouis): Other (See Comments) GI  problems   Pepto-Bismol [Bismuth Subsalicylate] Other (See Comments)    Bad reaction   Trazodone  And Nefazodone Other (See Comments)    Burn tongue and causes blisters   Elemental Sulfur Other (See Comments)    GI distress     Outpatient Medications Prior to Visit  Medication Sig   [Paused] amLODipine  (NORVASC ) 10 MG tablet Take 1 tablet (10 mg total) by mouth daily.   atorvastatin  (LIPITOR) 20 MG tablet TAKE ONE TABLET BY MOUTH EVERY NIGHT AT BEDTIME   busPIRone  (BUSPAR ) 7.5 MG tablet TAKE ONE TABLET (7.5 MG TOTAL) BY MOUTH TWO TIMES DAILY.   [Paused] carvedilol  (COREG ) 25 MG tablet Take 2 tablets (50 mg total) by mouth 2 (two) times daily with a meal. TAKE TWO TABLETS BY MOUTH TWO TIMES A DAY   chlorthalidone  (HYGROTON ) 25 MG tablet Take 25 mg by mouth daily.   cyanocobalamin  (VITAMIN B12) 1000 MCG/ML injection Inject 1,000 mcg into the muscle.    escitalopram  (LEXAPRO ) 10 MG tablet Take 10 mg by mouth.   JUBLIA 10 % SOLN APPLY TO AFFECTED TOENAIL DAILY, COVERING THE TOE, NAIL BED, AND SURROUNDING SKIN ETC.   melatonin 5 MG TABS Take 1 tablet (5 mg total) by mouth at bedtime.   temazepam  (RESTORIL ) 15 MG capsule Take 15 mg by mouth at bedtime as needed.   bisacodyl  (DULCOLAX) 10 MG suppository Place 1 suppository (10 mg total) rectally as needed for moderate constipation. (Patient not taking: Reported on 02/13/2024)   hydrOXYzine  (ATARAX ) 25 MG tablet Take 1 tablet (25 mg total) by mouth daily as needed for anxiety (sleep). (Patient not taking: Reported on 02/13/2024)   lactulose  (CHRONULAC ) 10 GM/15ML solution Take 45 mLs (30 g total) by mouth 2 (two) times daily as needed for mild constipation or moderate constipation. (Patient not taking: Reported on 02/13/2024)   polyethylene glycol powder (GLYCOLAX /MIRALAX ) 17 GM/SCOOP powder 1 cap full in a full glass of water, two times a day for 3 days. (Patient not taking: Reported on 02/13/2024)   senna-docusate (SENOKOT-Jeremiah [name]) 8.6-50 MG tablet Take 2 tablets by mouth 2 (two) times daily. (Patient not taking: Reported on 02/13/2024)   sodium chloride  1 g tablet Take 1 tablet (1 g total) by mouth 3 (three) times daily with meals. (Patient not taking: Reported on 02/13/2024)   Testosterone  1.62 % GEL SMARTSIG:40.5 Milligram(Latrenda Irani) Topical Every Morning (Patient not taking: Reported on 02/13/2024)   [DISCONTINUED] QUEtiapine  (SEROQUEL ) 100 MG tablet TAKE ONE TABLET BY MOUTH EVERY NIGHT AT BEDTIME WITH 300MG  FOR A TOTAL OF 700MG  (Patient not taking: Reported on 02/13/2024)   [DISCONTINUED] QUEtiapine  (SEROQUEL ) 300 MG tablet TAKE TWO TABLETS BY MOUTH AT BEDTIME WITH 100 MG TABLETS. **NEED APPT** (Patient not taking: Reported on 02/13/2024)   No facility-administered medications prior to visit.    ROS     Objective:   BP 112/82   Pulse (!) 59   Temp (!) 96.8 F (36 C)   Ht 5' 5 (1.651 m)   Wt 168 lb 6.4 oz  (76.4 kg)   SpO2 98%   BMI 28.02 kg/m   Vitals:   02/13/24 1046  BP: 112/82  Pulse: (!) 59  Temp: (!) 96.8 F (36 C)  Height: 5' 5 (1.651 m)  Weight: 168 lb 6.4 oz (76.4 kg)  SpO2: 98%  BMI (Calculated): 28.02    Physical Exam Vitals reviewed.  Constitutional:      Appearance: Normal appearance.  HENT:     Head: Normocephalic.     Left  Ear: There is no impacted cerumen.     Nose: Nose normal.     Mouth/Throat:     Mouth: Mucous membranes are moist.     Pharynx: No posterior oropharyngeal erythema.  Eyes:     Extraocular Movements: Extraocular movements intact.     Pupils: Pupils are equal, round, and reactive to light.  Cardiovascular:     Rate and Rhythm: Regular rhythm.     Chest Wall: PMI is not displaced.     Pulses: Normal pulses.     Heart sounds: Normal heart sounds. No murmur heard. Pulmonary:     Effort: Pulmonary effort is normal.     Breath sounds: Normal air entry. No rhonchi or rales.  Abdominal:     General: Abdomen is flat. Bowel sounds are normal. There is no distension.     Palpations: Abdomen is soft. There is no hepatomegaly, splenomegaly or mass.     Tenderness: There is no abdominal tenderness.  Genitourinary:    Prostate: Normal.  Musculoskeletal:        General: Normal range of motion.     Cervical back: Normal range of motion and neck supple.     Lumbar back: Tenderness (left sacroiliac region) present.     Right lower leg: No edema.     Left lower leg: No edema.  Skin:    General: Skin is warm and dry.     Comments: Kissing lesion on both lower legs macular rash which are circular.  Neurological:     General: No focal deficit present.     Mental Status: He is alert and oriented to person, place, and time.     Cranial Nerves: No cranial nerve deficit.     Motor: No weakness.  Psychiatric:        Mood and Affect: Mood normal.        Behavior: Behavior normal.      No results found for any visits on 02/13/24.       Assessment & Plan:  Jeremiah [name] was seen today for follow-up.  Bipolar disorder, current episode manic, severe, unspecified whether psychotic features (HCC) -     OLANZapine ; Take 1 tablet (5 mg total) by mouth at bedtime.  Dispense: 30 tablet; Refill: 0  Insomnia, unspecified type -     Belsomra ; Take 1 tablet (10 mg total) by mouth daily.  Dispense: 30 tablet; Refill: 2  Hyponatremia -     BMP8+Anion Gap    Problem List Items Addressed This Visit       Other   Hyponatremia   Relevant Orders   BMP8+Anion Gap   Insomnia   Relevant Medications   Suvorexant  (BELSOMRA ) 10 MG TABS   Other Visit Diagnoses       Bipolar disorder, current episode manic, severe, unspecified whether psychotic features (HCC)    -  Primary   Relevant Medications   OLANZapine  (ZYPREXA ) 5 MG tablet       Return in about 4 weeks (around 03/12/2024).   Total time spent: 30 minutes  Sherrill Cinderella Perry, MD  02/13/2024   This document may have been prepared by [facility] Voice Recognition software and as such may include unintentional dictation errors.

## 2024-02-14 LAB — BMP8+ANION GAP
Anion Gap: 16 mmol/L (ref 10.0–18.0)
BUN/Creatinine Ratio: 13 (ref 9–20)
BUN: 10 mg/dL (ref 6–24)
CO2: 22 mmol/L (ref 20–29)
Calcium: 9.8 mg/dL (ref 8.7–10.2)
Chloride: 86 mmol/L — ABNORMAL LOW (ref 96–106)
Creatinine, Ser: 0.8 mg/dL (ref 0.76–1.27)
Glucose: 109 mg/dL — ABNORMAL HIGH (ref 70–99)
Potassium: 3.7 mmol/L (ref 3.5–5.2)
Sodium: 124 mmol/L — ABNORMAL LOW (ref 134–144)
eGFR: 103 mL/min/1.73 (ref >59)

## 2024-02-15 ENCOUNTER — Ambulatory Visit: Payer: Self-pay | Admitting: Internal Medicine

## 2024-02-20 DIAGNOSIS — S61203A Unspecified open wound of left middle finger without damage to nail, initial encounter: Secondary | ICD-10-CM | POA: Diagnosis not present

## 2024-02-20 NOTE — Progress Notes (Signed)
  Subjective:    Chief Complaint  Patient presents with  . Laceration    X9/30 around 5 pm Pt states he bumped his left middle finger on the [facility] at his house. Denies numbness, tingling, pain. Has full RROM. Has full sensation He has been putting neosporin on States it was still bleeding today Denies blood thinners     History was provided by the patient and mother. Jeremiah [name] is a 58 y.o., male  who presents with 1.5 cm transverse avulsion laceration to the middle phalanx of his left middle finger.  Wound occurred approximately 20 hours prior to arrival.  Still bleeding intermittently.  Current on tetanus.  Full range of motion.  Neurovascular intact.  No complaints of headache or dizziness; no chest pain or shortness of breath; no nausea, vomiting, diarrhea; no fevers or chills. Symptoms include: above   Patient denies: above Treatment to date: above   Past Medical History:  Diagnosis Date  . Anxiety   . Chronic prostatitis   . Depression   . GERD (gastroesophageal reflux disease)   . High cholesterol   . History of hypogonadism 11/15/2016  . Hypertension   . Sleep apnea    The following portions of the patient's history were reviewed and updated as appropriate: allergies, current medications, past social history, and problem list.  Review of Systems A complete review of systems was performed.  Positive and pertinent negative responses are documented in the HPI, and all other systems are negative.   Objective:     Vitals:   02/20/24 1342 02/20/24 1343  BP: 92/55 102/62  Pulse: 61   Temp: 36.5 C (97.7 F)   TempSrc: Oral   SpO2: 97%   Weight: 73.9 kg (163 lb)   Height: 170.2 cm (5' 7)   PainSc: 0-No pain     General Appearance:  Well-developed, well-nourished.  Pleasant and cooperative.  No acute distress.  Here with mom. HEENT:  [place]/AT, PERRLA, EOMI, sclera clear, conjunctivae pink.   Neck:  Supple, no JVD or adenopathy. Cardiovascular:  Regular rate and  rhythm.  Lungs:  Clear to auscultation.  Abdomen: Benign. Extremities: Left hand-with 1.5 cm transverse laceration extensor surface of left middle phalanx of left middle finger.  Well-approximated.  No active bleeding.  Flap appears viable at this time.  Neurovascular intact. Neuro:  Alert and orient x4; nonfocal.    Lab/X-ray/Treatments:   Antibiotic ointment/dressing/finger splint-supplied here       Assessment:      1. Open wound of left middle finger -     cefadroxil (DURICEF) 500 mg capsule; Take 1 capsule (500 mg total) by mouth 2 (two) times daily for 7 days  Dispense: 14 capsule; Refill: 0 -     Finger splint   Ineligible for suture due to duration as well as nature of wound.  Well-approximated.  Antibiotic ointment, dressing, splint for 5 to 7 days.  Cefadroxil for prophylaxis.  Current on tetanus.  Discussed with patient and mom several times regarding need to keep immobilized to allow for healing.  Patient voices frustration regarding same-reassured.    Plan:   Requested Prescriptions   Signed Prescriptions Disp Refills  . cefadroxil (DURICEF) 500 mg capsule 14 capsule 0    Sig: Take 1 capsule (500 mg total) by mouth 2 (two) times daily for 7 days

## 2024-02-26 ENCOUNTER — Encounter: Payer: Self-pay | Admitting: Cardiology

## 2024-02-26 ENCOUNTER — Ambulatory Visit
Admission: RE | Admit: 2024-02-26 | Discharge: 2024-02-26 | Disposition: A | Source: Ambulatory Visit | Attending: Cardiology

## 2024-02-26 ENCOUNTER — Ambulatory Visit (INDEPENDENT_AMBULATORY_CARE_PROVIDER_SITE_OTHER): Admitting: Cardiology

## 2024-02-26 ENCOUNTER — Ambulatory Visit
Admission: RE | Admit: 2024-02-26 | Discharge: 2024-02-26 | Disposition: A | Discharge status: Home / Self Care | Attending: Cardiology | Admitting: Cardiology

## 2024-02-26 VITALS — BP 104/62 | HR 72 | Ht 67.0 in | Wt 162.0 lb

## 2024-02-26 DIAGNOSIS — E871 Hypo-osmolality and hyponatremia: Secondary | ICD-10-CM | POA: Diagnosis not present

## 2024-02-26 DIAGNOSIS — R110 Nausea: Secondary | ICD-10-CM | POA: Diagnosis not present

## 2024-02-26 DIAGNOSIS — K5909 Other constipation: Secondary | ICD-10-CM | POA: Insufficient documentation

## 2024-02-26 DIAGNOSIS — R5383 Other fatigue: Secondary | ICD-10-CM | POA: Diagnosis not present

## 2024-02-26 DIAGNOSIS — Z0130 Encounter for examination of blood pressure without abnormal findings: Secondary | ICD-10-CM

## 2024-02-26 DIAGNOSIS — I878 Other specified disorders of veins: Secondary | ICD-10-CM | POA: Diagnosis not present

## 2024-02-26 NOTE — Progress Notes (Unsigned)
 Established Patient Office Visit  Subjective:  Patient ID: Jeremiah [name], male    DOB: February 15, 1966  Age: 58 y.o. MRN: [id number]  Chief Complaint  Patient presents with  . Acute Visit    Bowels have not moved in 3 weeks. Having pain when eating. Supposed to have gall bladder removed never did it.    Patient in office for an acute visit, reports last bowel movement 3 weeks ago.   Constipation This is a new problem. The current episode started 1 to 4 weeks ago. The problem is unchanged. The patient is not on a high fiber diet. He Does not exercise regularly. There has Not been adequate water intake. Pertinent negatives include no abdominal pain or diarrhea.    No other concerns at this time.   Past Medical History:  Diagnosis Date  . Anxiety   . Benign essential HTN 08/27/2014  . Borderline personality disorder (HCC)   . BP (high blood pressure) 01/16/2015  . Difficulty urinating   . Diverticulosis   . Dysuria   . External hemorrhoid   . Gastroparesis   . Hypertension   . Hypokalemia   . Major depression   . Microscopic hematuria   . Nicotine  addiction   . Over weight   . Rectal fistula   . Schizophrenia (HCC)   . Stroke [facility])     Past Surgical History:  Procedure Laterality Date  . BACK SURGERY    . COLONOSCOPY    . COLONOSCOPY WITH PROPOFOL  N/A 10/12/2014   Procedure: COLONOSCOPY WITH PROPOFOL ;  Surgeon: Deward CINDERELLA Piedmont, MD;  Location: [facility] ENDOSCOPY;  Service: Gastroenterology;  Laterality: N/A;  . LUMBAR SPINE SURGERY    . TONSILLECTOMY      Social History   Socioeconomic History  . Marital status: Single    Spouse name: Not on file  . Number of children: Not on file  . Years of education: Not on file  . Highest education level: Not on file  Occupational History  . Not on file  Tobacco Use  . Smoking status: Former    Current packs/day: 0.00    Types: Cigarettes    Quit date: 08/19/2014    Years since quitting: 9.5  . Smokeless tobacco: Never  Substance  and Sexual Activity  . Alcohol use: No    Alcohol/week: 0.0 standard drinks of alcohol    Comment: last drink was in march 2016  . Drug use: No  . Sexual activity: Not Currently  Other Topics Concern  . Not on file  Social History Narrative  . Not on file   Social Drivers of Health   Financial Resource Strain: Not on file  Food Insecurity: Food Insecurity Present (02/05/2024)   Hunger Vital Sign   . Worried About Programme researcher, broadcasting/film/video in the Last Year: Sometimes true   . Ran Out of Food in the Last Year: Sometimes true  Transportation Needs: No Transportation Needs (02/05/2024)   PRAPARE - Transportation   . Lack of Transportation (Medical): No   . Lack of Transportation (Non-Medical): No  Physical Activity: Not on file  Stress: Not on file  Social Connections: Not on file  Intimate Partner Violence: Not At Risk (02/05/2024)   Humiliation, Afraid, Rape, and Kick questionnaire   . Fear of Current or Ex-Partner: No   . Emotionally Abused: No   . Physically Abused: No   . Sexually Abused: No    Family History  Problem Relation Age of Onset  . Stroke  Mother   . Stroke Maternal Grandmother   . Diabetes Mellitus II Sister     Allergies  Allergen Reactions  . Diflucan  [Fluconazole ] Diarrhea  . Ciprofloxacin  Other (See Comments)    GI distress  . Other     Other reaction(s): Other (See Comments) GI problems  . Pepto-Bismol [Bismuth Subsalicylate] Other (See Comments)    Bad reaction  . Trazodone  And Nefazodone Other (See Comments)    Burn tongue and causes blisters  . Elemental Sulfur Other (See Comments)    GI distress     Outpatient Medications Prior to Visit  Medication Sig  . amLODipine  (NORVASC ) 10 MG tablet Take 1 tablet (10 mg total) by mouth daily.  . atorvastatin  (LIPITOR) 20 MG tablet TAKE ONE TABLET BY MOUTH EVERY NIGHT AT BEDTIME  . carvedilol  (COREG ) 25 MG tablet Take 2 tablets (50 mg total) by mouth 2 (two) times daily with a meal. TAKE TWO TABLETS BY  MOUTH TWO TIMES A DAY  . cyanocobalamin  (VITAMIN B12) 1000 MCG/ML injection Inject 1,000 mcg into the muscle.  . melatonin 5 MG TABS Take 1 tablet (5 mg total) by mouth at bedtime.  . OLANZapine  (ZYPREXA ) 5 MG tablet Take 1 tablet (5 mg total) by mouth at bedtime.  . Suvorexant  (BELSOMRA ) 10 MG TABS Take 1 tablet (10 mg total) by mouth daily.  . temazepam  (RESTORIL ) 15 MG capsule Take 15 mg by mouth at bedtime as needed.  . bisacodyl  (DULCOLAX) 10 MG suppository Place 1 suppository (10 mg total) rectally as needed for moderate constipation. (Patient not taking: Reported on 02/26/2024)  . busPIRone  (BUSPAR ) 7.5 MG tablet TAKE ONE TABLET (7.5 MG TOTAL) BY MOUTH TWO TIMES DAILY. (Patient not taking: Reported on 02/26/2024)  . chlorthalidone  (HYGROTON ) 25 MG tablet Take 25 mg by mouth daily. (Patient not taking: Reported on 02/26/2024)  . escitalopram  (LEXAPRO ) 10 MG tablet Take 10 mg by mouth. (Patient not taking: Reported on 02/26/2024)  . hydrOXYzine  (ATARAX ) 25 MG tablet Take 1 tablet (25 mg total) by mouth daily as needed for anxiety (sleep). (Patient not taking: Reported on 02/26/2024)  . JUBLIA 10 % SOLN APPLY TO AFFECTED TOENAIL DAILY, COVERING THE TOE, NAIL BED, AND SURROUNDING SKIN ETC. (Patient not taking: Reported on 02/26/2024)  . lactulose  (CHRONULAC ) 10 GM/15ML solution Take 45 mLs (30 g total) by mouth 2 (two) times daily as needed for mild constipation or moderate constipation. (Patient not taking: Reported on 02/26/2024)  . polyethylene glycol powder (GLYCOLAX /MIRALAX ) 17 GM/SCOOP powder 1 cap full in a full glass of water, two times a day for 3 days. (Patient not taking: Reported on 02/26/2024)  . senna-docusate (SENOKOT-S) 8.6-50 MG tablet Take 2 tablets by mouth 2 (two) times daily. (Patient not taking: Reported on 02/26/2024)  . sodium chloride  1 g tablet Take 1 tablet (1 g total) by mouth 3 (three) times daily with meals. (Patient not taking: Reported on 02/26/2024)  . Testosterone  1.62 %  GEL SMARTSIG:40.5 Milligram(s) Topical Every Morning (Patient not taking: Reported on 02/26/2024)   No facility-administered medications prior to visit.    Review of Systems  Constitutional: Negative.   HENT: Negative.    Eyes: Negative.   Respiratory: Negative.  Negative for shortness of breath.   Cardiovascular: Negative.  Negative for chest pain.  Gastrointestinal:  Positive for constipation. Negative for abdominal pain and diarrhea.  Genitourinary: Negative.   Musculoskeletal:  Negative for joint pain and myalgias.  Skin: Negative.   Neurological: Negative.  Negative for dizziness and  headaches.  Endo/Heme/Allergies: Negative.   All other systems reviewed and are negative.      Objective:   BP 104/62   Pulse 72   Ht 5' 7 (1.702 m)   Wt 162 lb (73.5 kg)   SpO2 98%   BMI 25.37 kg/m   Vitals:   02/26/24 1140  BP: 104/62  Pulse: 72  Height: 5' 7 (1.702 m)  Weight: 162 lb (73.5 kg)  SpO2: 98%  BMI (Calculated): 25.37    Physical Exam Nursing note reviewed.  Constitutional:      Appearance: Normal appearance. He is normal weight.  HENT:     Head: Normocephalic and atraumatic.     Nose: Nose normal.     Mouth/Throat:     Mouth: Mucous membranes are moist.     Pharynx: Oropharynx is clear.  Eyes:     Extraocular Movements: Extraocular movements intact.     Conjunctiva/sclera: Conjunctivae normal.     Pupils: Pupils are equal, round, and reactive to light.  Cardiovascular:     Rate and Rhythm: Normal rate and regular rhythm.     Pulses: Normal pulses.     Heart sounds: Normal heart sounds.  Pulmonary:     Effort: Pulmonary effort is normal.     Breath sounds: Normal breath sounds.  Abdominal:     General: Abdomen is flat. Bowel sounds are normal.     Palpations: Abdomen is soft.  Musculoskeletal:        General: Normal range of motion.     Cervical back: Normal range of motion.  Skin:    General: Skin is warm and dry.  Neurological:     General: No  focal deficit present.     Mental Status: He is alert and oriented to person, place, and time.  Psychiatric:        Mood and Affect: Mood normal.        Behavior: Behavior normal.        Thought Content: Thought content normal.        Judgment: Judgment normal.      No results found for any visits on 02/26/24.  Recent Results (from the past 2160 hours)  Comprehensive metabolic panel     Status: Abnormal   Collection Time: 12/04/23  3:42 PM  Result Value Ref Range   Glucose 96 70 - 99 mg/dL   BUN 12 6 - 24 mg/dL   Creatinine, Ser 9.17 0.76 - 1.27 mg/dL   eGFR 897 >40 fO/fpw/8.26   BUN/Creatinine Ratio 15 9 - 20   Sodium 142 134 - 144 mmol/L   Potassium 4.2 3.5 - 5.2 mmol/L   Chloride 103 96 - 106 mmol/L   CO2 19 (L) 20 - 29 mmol/L   Calcium  10.1 8.7 - 10.2 mg/dL   Total Protein 7.7 6.0 - 8.5 g/dL   Albumin 4.8 3.8 - 4.9 g/dL   Globulin, Total 2.9 1.5 - 4.5 g/dL   Bilirubin Total 0.3 0.0 - 1.2 mg/dL   Alkaline Phosphatase 115 44 - 121 IU/L   AST 14 0 - 40 IU/L   ALT 14 0 - 44 IU/L  Lipid panel     Status: Abnormal   Collection Time: 12/04/23  3:42 PM  Result Value Ref Range   Cholesterol, Total 188 100 - 199 mg/dL   Triglycerides 866 0 - 149 mg/dL   HDL 59 >60 mg/dL   VLDL Cholesterol Cal 23 5 - 40 mg/dL   LDL Chol Calc (NIH)  106 (H) 0 - 99 mg/dL   Chol/HDL Ratio 3.2 0.0 - 5.0 ratio    Comment:                                   T. Chol/HDL Ratio                                             Men  Women                               1/2 Avg.Risk  3.4    3.3                                   Avg.Risk  5.0    4.4                                2X Avg.Risk  9.6    7.1                                3X Avg.Risk 23.4   11.0   Testosterone  Free with SHBG     Status: Abnormal   Collection Time: 12/04/23  3:42 PM  Result Value Ref Range   Testosterone , Serum (Total) 765 ng/dL    Comment: This test was developed and its performance characteristics determined by [facility]. It has  not been cleared or approved by the Food and Drug Administration.  Reference Range: Adult Males >18 years    21 - 916 This [facility] LC/MS-MS method is currently certified by the [facility] Hormone Standardization Program (HoST).  Adult male reference interval is based on a population of healthy nonobese males (BMI <30) between 25 and 47 years old. Gara birdie [place] [id number] PMID: [id number].    % Free Testosterone  0.9 %    Comment: This test was developed and its performance characteristics determined by [facility]. It has not been cleared or approved by the Food and Drug Administration. Reference Range: Adult Males: 1.5 - 3.2    Free Testosterone , S 69 pg/mL    Comment: Reference Range: Adult Males: 67 - 280    Sex Hormone Binding Globulin 112.0 (H) nmol/L    Comment: Reference Range: >49y: 19.3 - 76.4   CBC With Diff/Platelet     Status: None   Collection Time: 12/04/23  3:42 PM  Result Value Ref Range   WBC 4.8 3.4 - 10.8 x10E3/uL   RBC 4.34 4.14 - 5.80 x10E6/uL   Hemoglobin 13.4 13.0 - 17.7 g/dL   Hematocrit 59.7 62.4 - 51.0 %   MCV 93 79 - 97 fL   MCH 30.9 26.6 - 33.0 pg   MCHC 33.3 31.5 - 35.7 g/dL   RDW 88.0 88.3 - 84.5 %   Platelets 278 150 - 450 x10E3/uL   Neutrophils 62 Not Estab. %   Lymphs 23 Not Estab. %   Monocytes 10 Not Estab. %   Eos 4 Not Estab. %   Basos 1 Not Estab. %   Neutrophils Absolute 2.9 1.4 - 7.0 x10E3/uL   Lymphocytes Absolute  1.1 0.7 - 3.1 x10E3/uL   Monocytes Absolute 0.5 0.1 - 0.9 x10E3/uL   EOS (ABSOLUTE) 0.2 0.0 - 0.4 x10E3/uL   Basophils Absolute 0.0 0.0 - 0.2 x10E3/uL   Immature Granulocytes 0 Not Estab. %   Immature Grans (Abs) 0.0 0.0 - 0.1 x10E3/uL  Comprehensive metabolic panel     Status: Abnormal   Collection Time: 01/10/24  5:42 PM  Result Value Ref Range   Sodium 122 (L) 135 - 145 mmol/L   Potassium 2.9 (L) 3.5 - 5.1 mmol/L   Chloride 78 (L) 98 - 111 mmol/L   CO2 26 22 - 32 mmol/L   Glucose, Bld 125 (H) 70 - 99  mg/dL    Comment: Glucose reference range applies only to samples taken after fasting for at least 8 hours.   BUN 11 6 - 20 mg/dL   Creatinine, Ser 9.45 (L) 0.61 - 1.24 mg/dL   Calcium  10.6 (H) 8.9 - 10.3 mg/dL   Total Protein 7.9 6.5 - 8.1 g/dL   Albumin 5.0 3.5 - 5.0 g/dL   AST 35 15 - 41 U/L   ALT 21 0 - 44 U/L   Alkaline Phosphatase 65 38 - 126 U/L   Total Bilirubin 1.3 (H) 0.0 - 1.2 mg/dL   GFR, Estimated >39 >39 mL/min    Comment: (NOTE) Calculated using the CKD-EPI Creatinine Equation (2021)    Anion gap 18 (H) 5 - 15    Comment: Performed at [facility], [address] [place], [place] [address]  Ethanol     Status: None   Collection Time: 01/10/24  5:42 PM  Result Value Ref Range   Alcohol, Ethyl (B) <15 <15 mg/dL    Comment: (NOTE) For medical purposes only. Performed at [facility], [address] [place], [place] [address]   cbc     Status: None   Collection Time: 01/10/24  5:42 PM  Result Value Ref Range   WBC 7.1 4.0 - 10.5 K/uL   RBC RESULTS UNAVAILABLE DUE TO INTERFERING SUBSTANCE 4.22 - 5.81 MIL/uL   Hemoglobin 15.6 13.0 - 17.0 g/dL   HCT RESULTS UNAVAILABLE DUE TO INTERFERING SUBSTANCE 39.0 - 52.0 %   MCV RESULTS UNAVAILABLE DUE TO INTERFERING SUBSTANCE 80.0 - 100.0 fL   MCH RESULTS UNAVAILABLE DUE TO INTERFERING SUBSTANCE 26.0 - 34.0 pg   MCHC RESULTS UNAVAILABLE DUE TO INTERFERING SUBSTANCE 30.0 - 36.0 g/dL   RDW RESULTS UNAVAILABLE DUE TO INTERFERING SUBSTANCE 11.5 - 15.5 %   Platelets 351 150 - 400 K/uL   nRBC RESULTS UNAVAILABLE DUE TO INTERFERING SUBSTANCE 0.0 - 0.2 %    Comment: Performed at [facility], [address] [place], [place] [address]  Urine Drug Screen, Qualitative     Status: Abnormal   Collection Time: 01/10/24  5:42 PM  Result Value Ref Range   Tricyclic, Ur Screen NONE DETECTED NONE DETECTED   Amphetamines, Ur Screen NONE DETECTED NONE DETECTED   MDMA (Ecstasy)Ur Screen NONE DETECTED NONE  DETECTED   Cocaine Metabolite,Ur [place] NONE DETECTED NONE DETECTED   Opiate, Ur Screen NONE DETECTED NONE DETECTED   Phencyclidine (PCP) Ur S NONE DETECTED NONE DETECTED   Cannabinoid 50 Ng, Ur East  NONE DETECTED NONE DETECTED   Barbiturates, Ur Screen POSITIVE (A) NONE DETECTED   Benzodiazepine, Ur Scrn POSITIVE (A) NONE DETECTED   Methadone Scn, Ur NONE DETECTED NONE DETECTED    Comment: (NOTE) Tricyclics + metabolites, urine    Cutoff 1000 ng/mL Amphetamines +  metabolites, urine  Cutoff 1000 ng/mL MDMA (Ecstasy), urine              Cutoff 500 ng/mL Cocaine Metabolite, urine          Cutoff 300 ng/mL Opiate + metabolites, urine        Cutoff 300 ng/mL Phencyclidine (PCP), urine         Cutoff 25 ng/mL Cannabinoid, urine                 Cutoff 50 ng/mL Barbiturates + metabolites, urine  Cutoff 200 ng/mL Benzodiazepine, urine              Cutoff 200 ng/mL Methadone, urine                   Cutoff 300 ng/mL  The urine drug screen provides only a preliminary, unconfirmed analytical test result and should not be used for non-medical purposes. Clinical consideration and professional judgment should be applied to any positive drug screen result due to possible interfering substances. A more specific alternate chemical method must be used in order to obtain a confirmed analytical result. Gas chromatography / mass spectrometry (GC/MS) is the preferred confirm atory method. Performed at [facility], [address] [place], [place] [address]   Troponin I (High Sensitivity)     Status: None   Collection Time: 01/10/24  6:51 PM  Result Value Ref Range   Troponin I (High Sensitivity) 5 <18 ng/L    Comment: (NOTE) Elevated high sensitivity troponin I (hsTnI) values and significant  changes across serial measurements may suggest ACS but many other  chronic and acute conditions are known to elevate hsTnI results.  Refer to the Links section for chest pain algorithms and additional   guidance. Performed at [facility], [address] [place], [place] [address]   Lipase, blood     Status: None   Collection Time: 01/10/24  6:51 PM  Result Value Ref Range   Lipase 50 11 - 51 U/L    Comment: Performed at [facility], [address] [place], [place] [address]  Basic metabolic panel     Status: Abnormal   Collection Time: 01/10/24 10:40 PM  Result Value Ref Range   Sodium 124 (L) 135 - 145 mmol/L   Potassium 2.7 (LL) 3.5 - 5.1 mmol/L    Comment: CRITICAL RESULT CALLED TO, READ BACK BY AND VERIFIED WITH KIMBERLEY EVANS RN @2304  01/10/24 ASW    Chloride 84 (L) 98 - 111 mmol/L   CO2 26 22 - 32 mmol/L   Glucose, Bld 115 (H) 70 - 99 mg/dL    Comment: Glucose reference range applies only to samples taken after fasting for at least 8 hours.   BUN 9 6 - 20 mg/dL   Creatinine, Ser 9.24 0.61 - 1.24 mg/dL   Calcium  9.1 8.9 - 10.3 mg/dL   GFR, Estimated >39 >39 mL/min    Comment: (NOTE) Calculated using the CKD-EPI Creatinine Equation (2021)    Anion gap 14 5 - 15    Comment: Performed at [facility], [address], [place], [place] [address]  Basic metabolic panel     Status: Abnormal   Collection Time: 01/11/24  4:08 AM  Result Value Ref Range   Sodium 125 (L) 135 - 145 mmol/L   Potassium 3.2 (L) 3.5 - 5.1 mmol/L   Chloride 89 (L) 98 - 111 mmol/L   CO2 26 22 - 32 mmol/L   Glucose,  Bld 120 (H) 70 - 99 mg/dL    Comment: Glucose reference range applies only to samples taken after fasting for at least 8 hours.   BUN 16 6 - 20 mg/dL   Creatinine, Ser 8.89 0.61 - 1.24 mg/dL   Calcium  9.3 8.9 - 10.3 mg/dL   GFR, Estimated >39 >39 mL/min    Comment: (NOTE) Calculated using the CKD-EPI Creatinine Equation (2021)    Anion gap 10 5 - 15    Comment: Performed at [facility], [address] [place], [place] [address]  BMP8+Anion Gap     Status: Abnormal   Collection Time: 01/22/24  2:14 PM  Result Value Ref Range   Glucose  103 (H) 70 - 99 mg/dL   BUN 13 6 - 24 mg/dL   Creatinine, Ser 9.05 0.76 - 1.27 mg/dL   eGFR 95 >40 fO/fpw/8.26   BUN/Creatinine Ratio 14 9 - 20   Sodium 138 134 - 144 mmol/L   Potassium 3.9 3.5 - 5.2 mmol/L   Chloride 99 96 - 106 mmol/L   CO2 23 20 - 29 mmol/L   Anion Gap 16.0 10.0 - 18.0 mmol/L   Calcium  10.1 8.7 - 10.2 mg/dL  Lipase, blood     Status: None   Collection Time: 02/04/24 10:30 AM  Result Value Ref Range   Lipase 43 11 - 51 U/L    Comment: Performed at [facility], [address] [place], [place] [address]  Comprehensive metabolic panel     Status: Abnormal   Collection Time: 02/04/24 10:30 AM  Result Value Ref Range   Sodium 122 (L) 135 - 145 mmol/L   Potassium 2.8 (L) 3.5 - 5.1 mmol/L   Chloride 85 (L) 98 - 111 mmol/L   CO2 25 22 - 32 mmol/L   Glucose, Bld 111 (H) 70 - 99 mg/dL    Comment: Glucose reference range applies only to samples taken after fasting for at least 8 hours.   BUN 12 6 - 20 mg/dL   Creatinine, Ser 9.28 0.61 - 1.24 mg/dL   Calcium  9.2 8.9 - 10.3 mg/dL   Total Protein 6.9 6.5 - 8.1 g/dL   Albumin 4.2 3.5 - 5.0 g/dL   AST 39 15 - 41 U/L   ALT 23 0 - 44 U/L   Alkaline Phosphatase 51 38 - 126 U/L   Total Bilirubin 1.4 (H) 0.0 - 1.2 mg/dL   GFR, Estimated >39 >39 mL/min    Comment: (NOTE) Calculated using the CKD-EPI Creatinine Equation (2021)    Anion gap 12 5 - 15    Comment: Performed at [facility], [address] [place], [place] [address]  CBC     Status: None   Collection Time: 02/04/24 10:30 AM  Result Value Ref Range   WBC 5.6 4.0 - 10.5 K/uL   RBC RESULTS UNAVAILABLE DUE TO INTERFERING SUBSTANCE 4.22 - 5.81 MIL/uL   Hemoglobin 14.6 13.0 - 17.0 g/dL   HCT RESULTS UNAVAILABLE DUE TO INTERFERING SUBSTANCE 39.0 - 52.0 %   MCV RESULTS UNAVAILABLE DUE TO INTERFERING SUBSTANCE 80.0 - 100.0 fL   MCH RESULTS UNAVAILABLE DUE TO INTERFERING SUBSTANCE 26.0 - 34.0 pg   MCHC RESULTS UNAVAILABLE DUE TO INTERFERING  SUBSTANCE 30.0 - 36.0 g/dL   RDW RESULTS UNAVAILABLE DUE TO INTERFERING SUBSTANCE 11.5 - 15.5 %   Platelets 322 150 - 400 K/uL   nRBC RESULTS UNAVAILABLE DUE TO INTERFERING SUBSTANCE 0.0 - 0.2 %    Comment: Performed  at [facility], [address] [place], [place] [address]  Magnesium      Status: None   Collection Time: 02/04/24 10:30 AM  Result Value Ref Range   Magnesium  1.8 1.7 - 2.4 mg/dL    Comment: Performed at [facility], [address] [place], [place] [address]  Urinalysis, Routine w reflex microscopic -Urine, Random     Status: Abnormal   Collection Time: 02/04/24 10:31 AM  Result Value Ref Range   Color, Urine YELLOW (A) YELLOW   APPearance CLEAR (A) CLEAR   Specific Gravity, Urine 1.008 1.005 - 1.030   pH 7.0 5.0 - 8.0   Glucose, UA NEGATIVE NEGATIVE mg/dL   Hgb urine dipstick MODERATE (A) NEGATIVE   Bilirubin Urine NEGATIVE NEGATIVE   Ketones, ur NEGATIVE NEGATIVE mg/dL   Protein, ur NEGATIVE NEGATIVE mg/dL   Nitrite NEGATIVE NEGATIVE   Leukocytes,Ua NEGATIVE NEGATIVE   RBC / HPF 6-10 0 - 5 RBC/hpf   WBC, UA 0-5 0 - 5 WBC/hpf   Bacteria, UA NONE SEEN NONE SEEN   Squamous Epithelial / HPF 0-5 0 - 5 /HPF    Comment: Performed at [facility], [address] [place], [place] [address]  Ethanol     Status: None   Collection Time: 02/04/24 12:04 PM  Result Value Ref Range   Alcohol, Ethyl (B) <15 <15 mg/dL    Comment: (NOTE) For medical purposes only. Performed at [facility], [address] [place], [place] [address]   CBG monitoring, ED     Status: Abnormal   Collection Time: 02/04/24  9:40 PM  Result Value Ref Range   Glucose-Capillary 131 (H) 70 - 99 mg/dL    Comment: Glucose reference range applies only to samples taken after fasting for at least 8 hours.  Magnesium      Status: None   Collection Time: 02/05/24  3:12 AM  Result Value Ref Range   Magnesium  2.0 1.7 - 2.4 mg/dL    Comment: Performed at [facility], [address] [place], [place] [address]  CBC with Differential     Status: None   Collection Time: 02/05/24  3:12 AM  Result Value Ref Range   WBC 5.3 4.0 - 10.5 K/uL   RBC RESULTS UNAVAILABLE DUE TO INTERFERING SUBSTANCE 4.22 - 5.81 MIL/uL   Hemoglobin 13.3 13.0 - 17.0 g/dL   HCT RESULTS UNAVAILABLE DUE TO INTERFERING SUBSTANCE 39.0 - 52.0 %   MCV RESULTS UNAVAILABLE DUE TO INTERFERING SUBSTANCE 80.0 - 100.0 fL   MCH RESULTS UNAVAILABLE DUE TO INTERFERING SUBSTANCE 26.0 - 34.0 pg   MCHC RESULTS UNAVAILABLE DUE TO INTERFERING SUBSTANCE 30.0 - 36.0 g/dL   RDW RESULTS UNAVAILABLE DUE TO INTERFERING SUBSTANCE 11.5 - 15.5 %   Platelets 277 150 - 400 K/uL   nRBC RESULTS UNAVAILABLE DUE TO INTERFERING SUBSTANCE 0.0 - 0.2 %   Neutrophils Relative % 48 %   Neutro Abs 2.5 1.7 - 7.7 K/uL   Lymphocytes Relative 34 %   Lymphs Abs 1.8 0.7 - 4.0 K/uL   Monocytes Relative 14 %   Monocytes Absolute 0.8 0.1 - 1.0 K/uL   Eosinophils Relative 3 %   Eosinophils Absolute 0.1 0.0 - 0.5 K/uL   Basophils Relative 1 %   Basophils Absolute 0.0 0.0 - 0.1 K/uL   WBC Morphology MORPHOLOGY UNREMARKABLE    RBC Morphology See Note     Comment: MIXED RBC POPULATION   Smear Review Normal platelet morphology  Immature Granulocytes 0 %   Abs Immature Granulocytes 0.02 0.00 - 0.07 K/uL    Comment: Performed at [facility], [address] [place], [place] [address]  Comprehensive metabolic panel     Status: Abnormal   Collection Time: 02/05/24  3:12 AM  Result Value Ref Range   Sodium 123 (L) 135 - 145 mmol/L   Potassium 3.1 (L) 3.5 - 5.1 mmol/L   Chloride 89 (L) 98 - 111 mmol/L   CO2 26 22 - 32 mmol/L   Glucose, Bld 103 (H) 70 - 99 mg/dL    Comment: Glucose reference range applies only to samples taken after fasting for at least 8 hours.   BUN 14 6 - 20 mg/dL   Creatinine, Ser 9.35 0.61 - 1.24 mg/dL   Calcium  8.5 (L) 8.9 - 10.3 mg/dL   Total Protein 6.1 (L) 6.5 - 8.1 g/dL    Albumin 3.8 3.5 - 5.0 g/dL   AST 27 15 - 41 U/L   ALT 19 0 - 44 U/L   Alkaline Phosphatase 50 38 - 126 U/L   Total Bilirubin 1.0 0.0 - 1.2 mg/dL   GFR, Estimated >39 >39 mL/min    Comment: (NOTE) Calculated using the CKD-EPI Creatinine Equation (2021)    Anion gap 8 5 - 15    Comment: Performed at [facility], [address] [place], [place] [address]  Procalcitonin     Status: None   Collection Time: 02/05/24  3:12 AM  Result Value Ref Range   Procalcitonin <0.10 ng/mL    Comment:        Interpretation: PCT (Procalcitonin) <= 0.5 ng/mL: Systemic infection (sepsis) is not likely. Local bacterial infection is possible. (NOTE)       Sepsis PCT Algorithm           Lower Respiratory Tract                                      Infection PCT Algorithm    ----------------------------     ----------------------------         PCT < 0.25 ng/mL                PCT < 0.10 ng/mL          Strongly encourage             Strongly discourage   discontinuation of antibiotics    initiation of antibiotics    ----------------------------     -----------------------------       PCT 0.25 - 0.50 ng/mL            PCT 0.10 - 0.25 ng/mL               OR       >80% decrease in PCT            Discourage initiation of                                            antibiotics      Encourage discontinuation           of antibiotics    ----------------------------     -----------------------------         PCT >= 0.50 ng/mL  PCT 0.26 - 0.50 ng/mL               AND        <80% decrease in PCT             Encourage initiation of                                             antibiotics       Encourage continuation           of antibiotics    ----------------------------     -----------------------------        PCT >= 0.50 ng/mL                  PCT > 0.50 ng/mL               AND         increase in PCT                  Strongly encourage                                      initiation of  antibiotics    Strongly encourage escalation           of antibiotics                                     -----------------------------                                           PCT <= 0.25 ng/mL                                                 OR                                        > 80% decrease in PCT                                      Discontinue / Do not initiate                                             antibiotics  Performed at [facility], [address] [place], [place] [address]   TSH     Status: None   Collection Time: 02/05/24  3:12 AM  Result Value Ref Range   TSH 2.842 0.350 - 4.500 uIU/mL    Comment: Performed by a 3rd Generation assay with a functional sensitivity of <=0.01 uIU/mL. Performed at [facility], [address] [place], [place] [address]   Osmolality     Status: Abnormal  Collection Time: 02/05/24  3:12 AM  Result Value Ref Range   Osmolality 259 (L) 275 - 295 mOsm/kg    Comment: REPEATED TO VERIFY Performed at [facility], [address] [place], [place] [address]   Culture, blood (x 2)     Status: None   Collection Time: 02/05/24  3:13 AM   Specimen: BLOOD RIGHT ARM  Result Value Ref Range   Specimen Description BLOOD RIGHT ARM    Special Requests      BOTTLES DRAWN AEROBIC AND ANAEROBIC Blood Culture adequate volume   Culture      NO GROWTH 5 DAYS Performed at [facility], [address] [place], [place] [address]    Report Status 02/10/2024 FINAL   Culture, blood (x 2)     Status: None   Collection Time: 02/05/24  3:13 AM   Specimen: BLOOD RIGHT HAND  Result Value Ref Range   Specimen Description BLOOD RIGHT HAND    Special Requests      BOTTLES DRAWN AEROBIC AND ANAEROBIC Blood Culture adequate volume   Culture      NO GROWTH 5 DAYS Performed at [facility], [address] [place], [place] [address]    Report Status 02/10/2024 FINAL   Lactic acid, plasma     Status:  None   Collection Time: 02/05/24  3:13 AM  Result Value Ref Range   Lactic Acid, Venous 1.0 0.5 - 1.9 mmol/L    Comment: Performed at [facility], [address] [place], [place] [address]  Lactic acid, plasma     Status: None   Collection Time: 02/05/24  5:22 AM  Result Value Ref Range   Lactic Acid, Venous 1.5 0.5 - 1.9 mmol/L    Comment: Performed at [facility], [address] [place], [place] [address]  Cortisol-am, blood     Status: None   Collection Time: 02/05/24  5:54 AM  Result Value Ref Range   Cortisol - AM 12.3 6.7 - 22.6 ug/dL    Comment: Performed at [facility], [address] [place], [place] [address]  Osmolality, urine     Status: Abnormal   Collection Time: 02/05/24 10:30 AM  Result Value Ref Range   Osmolality, Ur 159 (L) 300 - 900 mOsm/kg    Comment: REPEATED TO VERIFY Performed at [facility], [address] [place], [place] [address]   Sodium, urine, random     Status: None   Collection Time: 02/05/24 10:30 AM  Result Value Ref Range   Sodium, Ur 37 mmol/L    Comment: Performed at [facility], [address] [place], [place] [address]  Magnesium      Status: None   Collection Time: 02/06/24  6:03 AM  Result Value Ref Range   Magnesium  2.3 1.7 - 2.4 mg/dL    Comment: Performed at [facility], [address] [place], [place] [address]  Basic metabolic panel with GFR     Status: Abnormal   Collection Time: 02/06/24  6:04 AM  Result Value Ref Range   Sodium 131 (L) 135 - 145 mmol/L    Comment: REPEATED TO VERIFY   Potassium 3.3 (L) 3.5 - 5.1 mmol/L   Chloride 99 98 - 111 mmol/L   CO2 26 22 - 32 mmol/L   Glucose, Bld 99 70 - 99 mg/dL    Comment: Glucose reference range applies only to samples taken after fasting for at least 8 hours.  BUN 10 6 - 20 mg/dL   Creatinine, Ser 9.35 0.61 - 1.24 mg/dL   Calcium  8.7 (L) 8.9 - 10.3 mg/dL   GFR, Estimated >39 >39 mL/min    Comment:  (NOTE) Calculated using the CKD-EPI Creatinine Equation (2021)    Anion gap 8 5 - 15    Comment: Performed at [facility], [address] [place], [place] [address]  Comprehensive metabolic panel     Status: Abnormal   Collection Time: 02/08/24  9:46 AM  Result Value Ref Range   Sodium 131 (L) 135 - 145 mmol/L   Potassium 3.5 3.5 - 5.1 mmol/L   Chloride 93 (L) 98 - 111 mmol/L   CO2 26 22 - 32 mmol/L   Glucose, Bld 122 (H) 70 - 99 mg/dL    Comment: Glucose reference range applies only to samples taken after fasting for at least 8 hours.   BUN 9 6 - 20 mg/dL   Creatinine, Ser 9.37 0.61 - 1.24 mg/dL   Calcium  10.4 (H) 8.9 - 10.3 mg/dL   Total Protein 8.0 6.5 - 8.1 g/dL   Albumin 4.8 3.5 - 5.0 g/dL   AST 31 15 - 41 U/L   ALT 21 0 - 44 U/L   Alkaline Phosphatase 62 38 - 126 U/L   Total Bilirubin 0.9 0.0 - 1.2 mg/dL   GFR, Estimated >39 >39 mL/min    Comment: (NOTE) Calculated using the CKD-EPI Creatinine Equation (2021)    Anion gap 12 5 - 15    Comment: Performed at [facility], [address] [place], [place] [address]  CBC     Status: Abnormal   Collection Time: 02/08/24  9:46 AM  Result Value Ref Range   WBC 7.1 4.0 - 10.5 K/uL   RBC 5.04 4.22 - 5.81 MIL/uL   Hemoglobin 15.7 13.0 - 17.0 g/dL   HCT 57.9 60.9 - 47.9 %   MCV 83.3 80.0 - 100.0 fL   MCH 31.2 26.0 - 34.0 pg   MCHC 37.4 (H) 30.0 - 36.0 g/dL   RDW 88.0 88.4 - 84.4 %   Platelets 378 150 - 400 K/uL   nRBC 0.0 0.0 - 0.2 %    Comment: Performed at [facility], [address] [place], [place] [address]  BMP8+Anion Gap     Status: Abnormal   Collection Time: 02/13/24 11:35 AM  Result Value Ref Range   Glucose 109 (H) 70 - 99 mg/dL   BUN 10 6 - 24 mg/dL   Creatinine, Ser 9.19 0.76 - 1.27 mg/dL   eGFR 896 >40 fO/fpw/8.26   BUN/Creatinine Ratio 13 9 - 20   Sodium 124 (L) 134 - 144 mmol/L   Potassium 3.7 3.5 - 5.2 mmol/L   Chloride 86 (L) 96 - 106 mmol/L   CO2 22 20 - 29 mmol/L    Anion Gap 16.0 10.0 - 18.0 mmol/L   Calcium  9.8 8.7 - 10.2 mg/dL      Assessment & Plan:   Problem List Items Addressed This Visit       Digestive   Constipation, chronic - Primary   Relevant Orders   DG Abd 2 Views   Ambulatory referral to Gastroenterology     Other   Hyponatremia   Relevant Orders   Basic metabolic panel with GFR   Other Visit Diagnoses       Other fatigue       Relevant Orders   Vitamin D (25 hydroxy)  Return if symptoms worsen or fail to improve, for as scheduled with TJ.   Total time spent: {AMA time spent:29001} minutes  [employer], NP  02/26/2024   This document may have been prepared by Dragon Voice Recognition software and as such may include unintentional dictation errors.

## 2024-02-27 LAB — BASIC METABOLIC PANEL WITH GFR
BUN/Creatinine Ratio: 12 (ref 9–20)
BUN: 11 mg/dL (ref 6–24)
CO2: 25 mmol/L (ref 20–29)
Calcium: 9.6 mg/dL (ref 8.7–10.2)
Chloride: 97 mmol/L (ref 96–106)
Creatinine, Ser: 0.92 mg/dL (ref 0.76–1.27)
Glucose: 119 mg/dL — ABNORMAL HIGH (ref 70–99)
Potassium: 4 mmol/L (ref 3.5–5.2)
Sodium: 137 mmol/L (ref 134–144)
eGFR: 97 mL/min/1.73 (ref >59)

## 2024-02-27 LAB — VITAMIN D 25 HYDROXY (VIT D DEFICIENCY, FRACTURES): Vit D, 25-Hydroxy: 32 ng/mL (ref 30.0–100.0)

## 2024-02-28 ENCOUNTER — Telehealth: Payer: Self-pay

## 2024-02-28 ENCOUNTER — Ambulatory Visit: Payer: Self-pay | Admitting: Cardiology

## 2024-02-28 NOTE — Progress Notes (Signed)
Pt informed

## 2024-02-28 NOTE — Telephone Encounter (Signed)
 Pt is requesting something be sent in to help him sleep at night. Pt states the pain from his constipation is keeping him up and the mediication he has been using isn't helping.

## 2024-03-03 ENCOUNTER — Telehealth: Payer: Self-pay

## 2024-03-03 NOTE — Telephone Encounter (Signed)
 Patient wife informed

## 2024-03-03 NOTE — Telephone Encounter (Signed)
 Patient spouse called asking about the xrays,I dont see them being resulted  they were advised that if he is taking all the constipation things that's been prescribed to him as he said he is doing  then if he is getting no relief he needed to go to the ED he states he's weak and feels like he's dying and I asked if he's drinking water and they both stated no, advised them to go to the ED but not [facility] if they were not happy with the care he's received recently by them, told them to go to [facility] in [place] or [facility] [place]

## 2024-03-06 NOTE — Progress Notes (Signed)
 Patient was already informed the other day and advised to go to the ED and then once that was taken care of dr laymon would address the sleeping issues

## 2024-03-07 ENCOUNTER — Telehealth: Payer: Self-pay

## 2024-03-07 NOTE — Telephone Encounter (Signed)
 The patient and his wife called requesting an earlier appointment. The patient is currently unable to use the restroom despite attempts and mentioned that his primary care physician has indicated a possible blockage. I informed them that the earliest available appointments are after his current scheduled visit. I have forwarded the request to Nurse Holli to see if there is any availability sooner and have transferred the call to her for further assistance.

## 2024-03-07 NOTE — Telephone Encounter (Signed)
 Per Grayce - patient should go to ED. Spoke with patient and again was instructed to drink plenty of fluids and continue miralax  .

## 2024-03-07 NOTE — Telephone Encounter (Signed)
 Patient stated he is very constipation- 2-3 weeks ago he mixed Miralax  with a little water and no results- also tried senna -dulcolax-magnesium  citrate provided in the ER - no bowel movement.  States he has abdominal tenderness- not passing gas- states he has been told that he has a bowel blockage. He is scheduled for 04/07/24 office visit with Robin. He is not consistently taking anything to help with constipation. He was encouraged to drink at least 64 ounces of fluid daily.

## 2024-03-11 ENCOUNTER — Other Ambulatory Visit: Payer: Self-pay | Admitting: Internal Medicine

## 2024-03-11 DIAGNOSIS — I10 Essential (primary) hypertension: Secondary | ICD-10-CM

## 2024-03-11 DIAGNOSIS — F3113 Bipolar disorder, current episode manic without psychotic features, severe: Secondary | ICD-10-CM

## 2024-03-13 ENCOUNTER — Emergency Department
Admission: EM | Admit: 2024-03-13 | Discharge: 2024-03-13 | Disposition: A | Discharge status: Home / Self Care | Attending: Emergency Medicine | Admitting: Emergency Medicine

## 2024-03-13 ENCOUNTER — Other Ambulatory Visit: Payer: Self-pay

## 2024-03-13 ENCOUNTER — Emergency Department

## 2024-03-13 DIAGNOSIS — R531 Weakness: Secondary | ICD-10-CM | POA: Diagnosis not present

## 2024-03-13 DIAGNOSIS — F259 Schizoaffective disorder, unspecified: Secondary | ICD-10-CM | POA: Diagnosis not present

## 2024-03-13 DIAGNOSIS — R001 Bradycardia, unspecified: Secondary | ICD-10-CM | POA: Diagnosis not present

## 2024-03-13 DIAGNOSIS — S0990XA Unspecified injury of head, initial encounter: Secondary | ICD-10-CM | POA: Diagnosis not present

## 2024-03-13 DIAGNOSIS — Z8673 Personal history of transient ischemic attack (TIA), and cerebral infarction without residual deficits: Secondary | ICD-10-CM | POA: Diagnosis not present

## 2024-03-13 DIAGNOSIS — R4182 Altered mental status, unspecified: Secondary | ICD-10-CM | POA: Diagnosis present

## 2024-03-13 DIAGNOSIS — R5383 Other fatigue: Secondary | ICD-10-CM | POA: Diagnosis present

## 2024-03-13 DIAGNOSIS — I10 Essential (primary) hypertension: Secondary | ICD-10-CM | POA: Diagnosis not present

## 2024-03-13 DIAGNOSIS — K5900 Constipation, unspecified: Secondary | ICD-10-CM | POA: Insufficient documentation

## 2024-03-13 DIAGNOSIS — I700 Atherosclerosis of aorta: Secondary | ICD-10-CM | POA: Insufficient documentation

## 2024-03-13 DIAGNOSIS — R079 Chest pain, unspecified: Secondary | ICD-10-CM | POA: Diagnosis not present

## 2024-03-13 DIAGNOSIS — G4700 Insomnia, unspecified: Secondary | ICD-10-CM

## 2024-03-13 DIAGNOSIS — R109 Unspecified abdominal pain: Secondary | ICD-10-CM | POA: Diagnosis present

## 2024-03-13 LAB — URINE DRUG SCREEN, QUALITATIVE (ARMC ONLY)
Amphetamines, Ur Screen: NOT DETECTED
Barbiturates, Ur Screen: NOT DETECTED
Benzodiazepine, Ur Scrn: NOT DETECTED
Cannabinoid 50 Ng, Ur ~~LOC~~: NOT DETECTED
Cocaine Metabolite,Ur ~~LOC~~: NOT DETECTED
MDMA (Ecstasy)Ur Screen: NOT DETECTED
Methadone Scn, Ur: NOT DETECTED
Opiate, Ur Screen: NOT DETECTED
Phencyclidine (PCP) Ur S: NOT DETECTED
Tricyclic, Ur Screen: POSITIVE — AB

## 2024-03-13 LAB — COMPREHENSIVE METABOLIC PANEL WITH GFR
ALT: 22 U/L (ref 0–44)
AST: 22 U/L (ref 15–41)
Albumin: 4 g/dL (ref 3.5–5.0)
Alkaline Phosphatase: 45 U/L (ref 38–126)
Anion gap: 9 (ref 5–15)
BUN: 13 mg/dL (ref 6–20)
CO2: 26 mmol/L (ref 22–32)
Calcium: 9.4 mg/dL (ref 8.9–10.3)
Chloride: 101 mmol/L (ref 98–111)
Creatinine, Ser: 0.68 mg/dL (ref 0.61–1.24)
GFR, Estimated: >60 mL/min (ref >60)
Glucose, Bld: 110 mg/dL — ABNORMAL HIGH (ref 70–99)
Potassium: 3.8 mmol/L (ref 3.5–5.1)
Sodium: 136 mmol/L (ref 135–145)
Total Bilirubin: 0.8 mg/dL (ref 0.0–1.2)
Total Protein: 7.3 g/dL (ref 6.5–8.1)

## 2024-03-13 LAB — URINALYSIS, ROUTINE W REFLEX MICROSCOPIC
Bacteria, UA: NONE SEEN
Bilirubin Urine: NEGATIVE
Glucose, UA: NEGATIVE mg/dL
Ketones, ur: NEGATIVE mg/dL
Leukocytes,Ua: NEGATIVE
Nitrite: NEGATIVE
Protein, ur: NEGATIVE mg/dL
Specific Gravity, Urine: 1.023 (ref 1.005–1.030)
Squamous Epithelial / HPF: 0 /HPF (ref 0–5)
pH: 7 (ref 5.0–8.0)

## 2024-03-13 LAB — CBC
HCT: 42.7 % (ref 39.0–52.0)
Hemoglobin: 15.1 g/dL (ref 13.0–17.0)
MCH: 31 pg (ref 26.0–34.0)
MCHC: 35.4 g/dL (ref 30.0–36.0)
MCV: 87.7 fL (ref 80.0–100.0)
Platelets: 331 K/uL (ref 150–400)
RBC: 4.87 MIL/uL (ref 4.22–5.81)
RDW: 12.1 % (ref 11.5–15.5)
WBC: 5.1 K/uL (ref 4.0–10.5)
nRBC: 0 % (ref 0.0–0.2)

## 2024-03-13 LAB — LIPASE, BLOOD: Lipase: 31 U/L (ref 11–51)

## 2024-03-13 LAB — TROPONIN I (HIGH SENSITIVITY): Troponin I (High Sensitivity): 3 ng/L (ref <18)

## 2024-03-13 MED ORDER — IOHEXOL 300 MG/ML  SOLN
100.0000 mL | Freq: Once | INTRAMUSCULAR | Status: AC | PRN
  Administered 2024-03-13: 100 mL via INTRAVENOUS

## 2024-03-13 MED ORDER — PANTOPRAZOLE SODIUM 40 MG PO TBEC: 40.0000 mg | DELAYED_RELEASE_TABLET | Freq: Every day | ORAL | 0 refills | Status: AC

## 2024-03-13 MED ORDER — ALUM & MAG HYDROXIDE-SIMETH 200-200-20 MG/5ML PO SUSP
30.0000 mL | Freq: Once | ORAL | Status: AC
Start: 2024-03-13 — End: 2024-03-13
  Administered 2024-03-13: 30 mL via ORAL
  Filled 2024-03-13: qty 30

## 2024-03-13 MED ORDER — PANTOPRAZOLE SODIUM 40 MG IV SOLR
40.0000 mg | Freq: Once | INTRAVENOUS | Status: AC
Start: 2024-03-13 — End: 2024-03-13
  Administered 2024-03-13: 40 mg via INTRAVENOUS
  Filled 2024-03-13: qty 10

## 2024-03-13 NOTE — Consult Note (Signed)
 [facility] Psychiatric Consult Initial  Patient Name: .QUANDRE POLINSKI  MRN: [id number]  DOB: 12-Jul-1965  Consult Order details:  Orders (From admission, onward)     Start     Ordered   03/13/24 1126  CONSULT TO CALL ACT TEAM       Ordering Provider: Ernest Ronal BRAVO, MD  Provider:  (Not yet assigned)  Question:  Reason for Consult?  Answer:  Psych consult   03/13/24 1126   03/13/24 1126  IP CONSULT TO PSYCHIATRY       Ordering Provider: Ernest Ronal BRAVO, MD  Provider:  (Not yet assigned)  Question:  Reason for consult:  Answer:  Medication management   03/13/24 1126             Mode of Visit: In person    Psychiatry Consult Evaluation  Service Date: March 13, 2024 LOS:  LOS: 0 days  Chief Complaint I can't poop  Primary Psychiatric Diagnoses  Hx of self reported schizophrenia Insomnia   Assessment  RORIK VESPA is a 58 y.o. male admitted: Presented to the ED  Per EDP note: ROYER CRISTOBAL is a 58 y.o. male with history of constipation, abdominal pain who comes in with concerns for the above.  Patient reports issues with his stomach for months.  He reports having issues with constipation and try to take MiraLAX  to help soften the stools.  He reports just not feeling hungry and because of this and his decreased appetite he feels more weak and fatigued.  Does report some occasional confusion from not sleeping.  He however reports most of his issue is with his abdomen.  He does report that the symptoms started prior to his son dying and but because he was so weak that he could not go to the funeral.  He does report depression but denies any SI.   I did review a note from 01/11/2024 where patient was started on Seroquel  200 at nighttime, melatonin for sleep regularization and mood stabilization.   He reports that because he has difficulty sleeping he sometimes does take other medications such as Benadryl , seroquel  to try and help with sleep.  On current assessment, patient  currently denying suicidal or homicidal ideations.  Patient denies any auditory or visual hallucinations.On current presentation there was no evidence of psychosis or mania and patient did not appear to be responding to internal stimuli. At this time, patient does not appear to be a risk to self or others.While future psychiatric events cannot be accurately predicted, the patient does not currently require acute inpatient psychiatric care and does not currently meet [place]  involuntary commitment criteria.  Patient's main concern voiced during interview was his inability to have bowel movement.  Patient reported he had outpatient primary care follow-up appointment scheduled for Tuesday of next week, but reported I am just too tired to make that.  On exam patient was able to independently walk from stretcher and 5 hall in the emergency department to the interview room on the other side of the emergency department.  Patient did not appear to have labored breathing or impaired mobility during this assessment.  Patient was encouraged to keep follow-up appointment as scheduled and try to attempt to make this.  We discussed the limitations of being able to safely prescribe medications in the  emergency department with no follow-up or wait to monitor patient for side effects.  Patient verbalized understanding at this time.  Voluntary admission to inpatient psychiatric unit was offered  to patient, however patient declined at this time as he feels his symptoms are more physically related.    Diagnoses:  Active Hospital problems: Active Problems:   Insomnia    Plan   ## Psychiatric Medication Recommendations:  Follow up with PCP  ## Medical Decision Making Capacity: Not specifically addressed in this encounter     ## Disposition:-- There are no psychiatric contraindications to discharge at this time  ## Behavioral / Environmental: - No specific recommendations at this time.     ## Safety and  Observation Level:  - Based on my clinical evaluation, I estimate the patient to be at low risk of self harm in the current setting. - At this time, we recommend  routine. This decision is based on my review of the chart including patient's history and current presentation, interview of the patient, mental status examination, and consideration of suicide risk including evaluating suicidal ideation, plan, intent, suicidal or self-harm behaviors, risk factors, and protective factors. This judgment is based on our ability to directly address suicide risk, implement suicide prevention strategies, and develop a safety plan while the patient is in the clinical setting. Please contact our team if there is a concern that risk level has changed.  CSSR Risk Category:C-SSRS RISK CATEGORY: No Risk  Suicide Risk Assessment: Patient has following modifiable risk factors for suicide: lack of access to outpatient mental health resources, which we are addressing by patient declined referral to outpatient mental health providers. Patient has following non-modifiable or demographic risk factors for suicide: male gender Patient has the following protective factors against suicide: Supportive friends  Thank you for this consult request. Recommendations have been communicated to the primary team.  We will sign off at this time.   Zelda Sharps, NP        History of Present Illness  Relevant Aspects of Hospital ED   Patient Report:  On current assessment, patient currently denying suicidal or homicidal ideations.  Patient denies any auditory or visual hallucinations.On current presentation there was no evidence of psychosis or mania and patient did not appear to be responding to internal stimuli. At this time, patient does not appear to be a risk to self or others.While future psychiatric events cannot be accurately predicted, the patient does not currently require acute inpatient psychiatric care and does not currently  meet [place]  involuntary commitment criteria.  Patient's main concern voiced during interview was his inability to have bowel movement.  Patient reported he had outpatient primary care follow-up appointment scheduled for Tuesday of next week, but reported I am just too tired to make that.  On exam patient was able to independently walk from stretcher and 5 hall in the emergency department to the interview room on the other side of the emergency department.  Patient did not appear to have labored breathing or impaired mobility during this assessment.  Patient was encouraged to keep follow-up appointment as scheduled and try to attempt to make this.  We discussed the limitations of being able to safely prescribe medications in the  emergency department with no follow-up or way to monitor patient for side effects.  Patient verbalized understanding at this time.  Voluntary admission to inpatient psychiatric unit was offered to patient, however patient declined at this time as he feels his symptoms are more physically related.  Patient reported he is currently taking 150 mg of Seroquel  at night.  He reported this is prescribed to him by his primary care doctor.  We offered referral  to outpatient mental health resources, but patient stated I do not feel like it.  Patient did report taking Benadryl  nightly to help assist with sleep.  We discussed the risk of continued Benadryl  use at night during current assessment.  We explained the importance of following up with his primary care doctor for continued management of his insomnia and GI symptoms.  Patient requested Ambien  from this provider, we discussed the risk and limitations of this medication and not being able to prescribe this medication in the emergency department as we have no means to follow-up with patient after discharge.  Voluntary inpatient psychiatric admission was offered to patient, to which patient declined multiple times.  Patient stated he  would follow-up with his primary care doctor related to his issues with bowel movements.  He did report when he takes his Seroquel , he is able to fall asleep about 1 to 2 hours later.  He did report his GI issues causing anxiety, which may be contributing to some issues with sleep as well.  It was discussed multiple times the importance of patient following up with primary care doctor for continued management of symptoms.   He reported no recent self-harm attempts.  He denied any access to any weapons.  There is no noted evidence of previous self-harm attempts in chart review performed by this provider.  Again at this time patient declines voluntary admission to the inpatient psychiatric unit.  Strict return precautions were provided for patient.  Patient was walked back to his ED stretcher by psychiatry team and reported that medical team will follow-up with his plan.  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 Hx: Denied Living Situation: With friend who also provides transportation Spiritual Hx: Unknown Access to weapons/lethal means: Denied   Substance History Alcohol: Denied  Tobacco: Denied Illicit drugs: Denied Prescription drug abuse: Denied Rehab hx: Denied  Exam Findings  Physical Exam: Deferred to EDP- note reviewed   Vital Signs:  Temp:  [97.8 F (36.6 C)] 97.8 F (36.6 C) (10/23 0941) Pulse  Rate:  [66] 66 (10/23 0941) Resp:  [16] 16 (10/23 0941) BP: (132)/(91) 132/91 (10/23 0941) SpO2:  [99 %] 99 % (10/23 0941) Weight:  [73 kg] 73 kg (10/23 0942) Blood pressure (!) 132/91, pulse 66, temperature 97.8 F (36.6 C), temperature source Oral, resp. rate 16, height 5' 7 (1.702 m), weight 73 kg, SpO2 99%. Body mass index is 25.21 kg/m.    Mental Status Exam: General Appearance: Casual  Orientation:  Full (Time, Place, and Person)  Memory:  Immediate;   Good Recent;   Good Remote;   Good  Concentration:  Concentration: Good and Attention Span: Good  Recall:  Good  Attention  Fair  Eye Contact:  Good  Speech:  Clear and Coherent  Language:  Good  Volume:  Normal  Mood: Trouble with bowels  Affect:  Appropriate  Thought Process:  Coherent, Goal Directed, and Linear  Thought Content:  Logical  Suicidal Thoughts:  No  Homicidal Thoughts:  No  Judgement:  Impaired  Insight:  Lacking  Psychomotor Activity:  Normal  Akathisia:  No  Fund of Knowledge:  Fair      Assets:  Engineer, maintenance Social Support  Cognition:  WNL  ADL's:  Intact  AIMS (if indicated):        Other History   These have been pulled in through the EMR, reviewed, and updated if appropriate.  Family History:  The patient's family history includes Diabetes Mellitus II in his sister; Stroke in his maternal grandmother and mother.  Medical History: Past Medical History:  Diagnosis Date   Anxiety    Benign essential HTN 08/27/2014   Borderline personality disorder (HCC)    BP (high blood pressure) 01/16/2015   Difficulty urinating    Diverticulosis    Dysuria    External hemorrhoid    Gastroparesis    Hypertension    Hypokalemia    Major depression    Microscopic hematuria    Nicotine  addiction    Over weight    Rectal fistula    Schizophrenia (HCC)    Stroke [facility])     Surgical History: Past Surgical History:  Procedure Laterality Date   BACK SURGERY     COLONOSCOPY      COLONOSCOPY WITH PROPOFOL  N/A 10/12/2014   Procedure: COLONOSCOPY WITH PROPOFOL ;  Surgeon: Deward CINDERELLA Piedmont, MD;  Location: [facility] ENDOSCOPY;  Service: Gastroenterology;  Laterality: N/A;   LUMBAR SPINE SURGERY     TONSILLECTOMY       Medications:  No current facility-administered medications for this encounter.  Current Outpatient Medications:    pantoprazole  (PROTONIX ) 40 MG tablet, Take 1 tablet (40 mg total) by mouth daily for 14 days., Disp: 14 tablet, Rfl: 0   QUEtiapine  (SEROQUEL ) 100 MG tablet, Take 100 mg by mouth at bedtime., Disp: , Rfl:    QUEtiapine  (SEROQUEL ) 300 MG tablet, Take 300 mg by mouth 2 (two) times daily., Disp: , Rfl:    amLODipine  (NORVASC ) 10 MG tablet, TAKE ONE TABLET (10 MG TOTAL) BY MOUTH DAILY., Disp: 30 tablet, Rfl: 2   atorvastatin  (LIPITOR) 20 MG tablet, TAKE ONE TABLET BY MOUTH EVERY NIGHT AT BEDTIME, Disp: 100 tablet, Rfl: 0   bisacodyl  (DULCOLAX) 10 MG suppository, Place 1 suppository (10 mg total) rectally as needed for moderate constipation. (Patient not taking: Reported on 02/26/2024), Disp: 12 suppository, Rfl: 0   busPIRone  (BUSPAR ) 7.5 MG tablet, TAKE ONE TABLET (7.5 MG TOTAL) BY MOUTH TWO TIMES DAILY. (Patient not taking: Reported on 02/26/2024), Disp: 180 tablet, Rfl: 0   carvedilol  (COREG ) 25 MG tablet, TAKE TWO TABLETS (50 MG TOTAL) BY MOUTH TWO (TWO) TIMES DAILY WITH A MEAL, Disp: 360 tablet, Rfl: 0   chlorthalidone  (HYGROTON ) 25 MG tablet, TAKE ONE TABLET (25 MG TOTAL) BY MOUTH EVERY MORNING., Disp: 30 tablet, Rfl: 2   cyanocobalamin  (VITAMIN B12) 1000 MCG/ML injection, Inject 1,000 mcg into the muscle., Disp: , Rfl:    escitalopram  (LEXAPRO ) 10 MG tablet, Take 10 mg by mouth. (Patient not taking: Reported on 02/26/2024), Disp: , Rfl:    hydrOXYzine  (ATARAX ) 25 MG tablet, Take 1 tablet (25 mg total) by mouth daily as needed for anxiety (sleep). (Patient not taking: Reported on 02/26/2024), Disp: 30 tablet, Rfl: 0   JUBLIA 10 % SOLN, APPLY TO AFFECTED  TOENAIL DAILY, COVERING THE TOE, NAIL BED, AND SURROUNDING SKIN ETC. (Patient not taking: Reported on 02/26/2024), Disp: 4 mL, Rfl: 2   lactulose  (CHRONULAC ) 10 GM/15ML solution, Take 45  mLs (30 g total) by mouth 2 (two) times daily as needed for mild constipation or moderate constipation. (Patient not taking: Reported on 02/26/2024), Disp: 236 mL, Rfl: 0   melatonin 5 MG TABS, Take 1 tablet (5 mg total) by mouth at bedtime., Disp: 30 tablet, Rfl: 0   OLANZapine  (ZYPREXA ) 5 MG tablet, Take 1 tablet (5 mg total) by mouth at bedtime., Disp: 30 tablet, Rfl: 0   polyethylene glycol powder (GLYCOLAX /MIRALAX ) 17 GM/SCOOP powder, 1 cap full in a full glass of water, two times a day for 3 days. (Patient not taking: Reported on 02/26/2024), Disp: 255 g, Rfl: 0   senna-docusate (SENOKOT-S) 8.6-50 MG tablet, Take 2 tablets by mouth 2 (two) times daily. (Patient not taking: Reported on 02/26/2024), Disp: 120 tablet, Rfl: 0   sodium chloride  1 g tablet, Take 1 tablet (1 g total) by mouth 3 (three) times daily with meals. (Patient not taking: Reported on 02/26/2024), Disp: 90 tablet, Rfl: 1   Suvorexant  (BELSOMRA ) 10 MG TABS, Take 1 tablet (10 mg total) by mouth daily., Disp: 30 tablet, Rfl: 2   temazepam  (RESTORIL ) 15 MG capsule, Take 15 mg by mouth at bedtime as needed., Disp: , Rfl:    Testosterone  1.62 % GEL, SMARTSIG:40.5 Milligram(s) Topical Every Morning (Patient not taking: Reported on 02/26/2024), Disp: , Rfl:   Allergies: Allergies  Allergen Reactions   Diflucan  [Fluconazole ] Diarrhea   Ciprofloxacin  Other (See Comments)    GI distress   Other     Other reaction(s): Other (See Comments) GI problems   Pepto-Bismol [Bismuth Subsalicylate] Other (See Comments)    Bad reaction   Trazodone  And Nefazodone Other (See Comments)    Burn tongue and causes blisters   Elemental Sulfur Other (See Comments)    GI distress     Zelda Sharps, NP

## 2024-03-13 NOTE — BH Assessment (Signed)
 Comprehensive Clinical Assessment (CCA) Screening, Triage and Referral Note  03/13/2024 Jeremiah [name] [id number]  Chief Complaint:  Chief Complaint  Patient presents with   Abdominal Pain   Weakness   Constipation   Insomnia   Visit Diagnosis: Anxiety  Patient came to the ER due to medical concerns. Patient is experiencing anxiety because he isn't having a regular bowel movement. Patient denies SI/HI and AV/H.  Patient Reported Information How did you hear about us ? Self  What Is the Reason for Your Visit/Call Today? Patient came to the ER due to medical concerns.  How Long Has This Been Causing You Problems? 1 wk - 1 month  What Do You Feel Would Help You the Most Today? Treatment for Depression or other mood problem   Have You Recently Had Any Thoughts About Hurting Yourself? No  Are You Planning to Commit Suicide/Harm Yourself At This time? No   Have you Recently Had Thoughts About Hurting Someone [place]? No  Are You Planning to Harm Someone at This Time? No  Explanation: Pt denied having SI/HI.   Have You Used Any Alcohol or Drugs in the Past 24 Hours? No  How Long Ago Did You Use Drugs or Alcohol? No data recorded What Did You Use and How Much? No data recorded  Do You Currently Have a Therapist/Psychiatrist? No  Name of Therapist/Psychiatrist: No data recorded  Have You Been Recently Discharged From Any Office Practice or Programs? No  Explanation of Discharge From Practice/Program: No data recorded   CCA Screening Triage Referral Assessment Type of Contact: Face-to-Face  Telemedicine Service Delivery:   Is this Initial or Reassessment?   Date Telepsych consult ordered in [facility]:    Time Telepsych consult ordered in [facility]:    Location of Assessment: [facility] ED  Provider Location: [facility] ED    Collateral Involvement: None provided   Does Patient Have a Court Appointed Legal Guardian? No data recorded Name and Contact of Legal Guardian: No data  recorded If Minor and Not Living with Parent(s), Who has Custody? n/a  Is CPS involved or ever been involved? Never  Is APS involved or ever been involved? Never   Patient Determined To Be At Risk for Harm To Self or Others Based on Review of Patient Reported Information or Presenting Complaint? No  Method: No Plan  Availability of Means: No access or NA  Intent: Vague intent or NA  Notification Required: No need or identified person  Additional Information for Danger to Others Potential: -- (n/a)  Additional Comments for Danger to Others Potential: N/A  Are There Guns or Other Weapons in Your Home? No  Types of Guns/Weapons: N/A  Are These Weapons Safely Secured?                            No  Who Could Verify You Are Able To Have These Secured: N/A  Do You Have any Outstanding Charges, Pending Court Dates, Parole/Probation? None reported  Contacted To Inform of Risk of Harm To Self or Others: Other: Comment   Does Patient Present under Involuntary Commitment? No    [place] of Residence: [place]   Patient Currently Receiving the Following Services: Medication Management   Determination of Need: Emergent (2 hours)   Options For Referral: No data recorded  Disposition Recommendation per psychiatric provider: Don't meet inpatient criteria.   Kiki DOROTHA Barge MS, LCAS, [facility], [facility] Therapeutic Triage Specialist 03/13/2024 3:40 PM

## 2024-03-13 NOTE — Discharge Instructions (Addendum)
 Your CT scan was reassuring your blood work was reassuring.  You do not meet any admitting criteria.  We can start you on an acid reducer to try to help with the burning sensation but otherwise you need to follow-up with GI to discuss colonoscopy and discuss further workup for the above.  You are to the ER for fevers, worsening symptoms or any other concerns  IMPRESSION: 1. No acute findings in the abdomen/pelvis. 2. Aortic atherosclerosis.

## 2024-03-13 NOTE — ED Provider Notes (Addendum)
 [facility] Provider Note    Event Date/Time   First MD Initiated Contact with Patient 03/13/24 1008     (approximate)   History   Abdominal Pain, Weakness, Constipation, and Insomnia   HPI  Jeremiah [name] is a 58 y.o. male with history of constipation, abdominal pain who comes in with concerns for the above.  Patient reports issues with his stomach for months.  He reports having issues with constipation and try to take MiraLAX  to help soften the stools.  He reports just not feeling hungry and because of this and his decreased appetite he feels more weak and fatigued.  Does report some occasional confusion from not sleeping.  He however reports most of his issue is with his abdomen.  He does report that the symptoms started prior to his son dying and but because he was so weak that he could not go to the funeral.  He does report depression but denies any SI.  I did review a note from 01/11/2024 where patient was started on Seroquel  200 at nighttime, melatonin for sleep regularization and mood stabilization.  He reports that because he has difficulty sleeping he sometimes does take other medications such as Benadryl , seroquel  to try and help with sleep.    Physical Exam   Triage Vital Signs: ED Triage Vitals  Encounter Vitals Group     BP 03/13/24 0941 (!) 132/91     Girls Systolic BP Percentile --      Girls Diastolic BP Percentile --      Boys Systolic BP Percentile --      Boys Diastolic BP Percentile --      Pulse Rate 03/13/24 0941 66     Resp 03/13/24 0941 16     Temp 03/13/24 0941 97.8 F (36.6 C)     Temp Source 03/13/24 0941 Oral     SpO2 03/13/24 0941 99 %     Weight 03/13/24 0942 160 lb 15 oz (73 kg)     Height 03/13/24 0942 5' 7 (1.702 m)     Head Circumference --      Peak Flow --      Pain Score 03/13/24 0942 8     Pain Loc --      Pain Education --      Exclude from Growth Chart --     Most recent vital signs: Vitals:    03/13/24 0941  BP: (!) 132/91  Pulse: 66  Resp: 16  Temp: 97.8 F (36.6 C)  SpO2: 99%     General: Awake, no distress.  CV:  Good peripheral perfusion.  Resp:  Normal effort.  Abd:  No distention.  Slight tenderness throughout the abdomen mostly in the upper abdomen Other:  Able to pick both legs up of the legs and able to move his arms Denies any SI Patient is able to ambulate to the bathroom.  ED Results / Procedures / Treatments   Labs (all labs ordered are listed, but only abnormal results are displayed) Labs Reviewed  COMPREHENSIVE METABOLIC PANEL WITH GFR - Abnormal; Notable for the following components:      Result Value   Glucose, Bld 110 (*)    All other components within normal limits  LIPASE, BLOOD  CBC  URINALYSIS, ROUTINE W REFLEX MICROSCOPIC  TROPONIN I (HIGH SENSITIVITY)     EKG  My interpretation of EKG:  Sinus bradycardia rate of 59 without any ST elevation or T wave inversions, occasional PVC  RADIOLOGY I have reviewed the ct personally and interpreted with some stool noted.   PROCEDURES:  Critical Care performed: No  Procedures   MEDICATIONS ORDERED IN ED: Medications - No data to display   IMPRESSION / MDM / ASSESSMENT AND PLAN / ED COURSE  I reviewed the triage vital signs and the nursing notes.   Patient's presentation is most consistent with acute presentation with potential threat to life or bodily function.  Differential includes AKI, electrolyte abnormalities, constipation, perforation, obstruction, cancer, mass.  Will proceed with CT imaging of the head to look for any type of cancer as well as CT imaging of the abdomen pelvis for obstruction, mass or other acute pathology.  CT head was negative.  IMPRESSION: 1. No acute findings in the abdomen/pelvis. 2. Aortic atherosclerosis.  Troponin was negative and symptoms were ongoing for some time now.  His CMP is reassuring CBC is reassuring.  Discussed with patient does not  meet any admitting criteria from a GI perspective and he is a little bit of stool in his abdomen.  His bladder did look a little enlarged but he was able to urinate without any issues.  He does report feeling really depressed lately with difficulties with sleeping and he is interested in talking to psychiatry today.  Patient is medically cleared to be dispositioned by psychiatry.  I was told that patient had left the emergency room and eloped.  I did not place patient as discharged as psychiatry had not told me their plan yet but it sounds like after patient left I did discuss with Zelda Sharps who stated that patient did not want voluntary admission he denied any SI HI auditory visual loose sensations and that the plan was for him to follow-up with his doctors appointment on Tuesda and that he was cleared from their perspective. No need to IVC patient.  NO plan for change in medications. So the plan was for patient to be discharged from my standpoint and I had written up his paperwork although patient left prior to receiving the paperwork.  However this will be in his MyChart.  Given the plan was for discharge we will place patient as a discharge rather than elopement given his reassuring workup and psychiatry had already cleared patient to go home.  The patient is on the cardiac monitor to evaluate for evidence of arrhythmia and/or significant heart rate changes.      FINAL CLINICAL IMPRESSION(S) / ED DIAGNOSES   Final diagnoses:  Constipation, unspecified constipation type     Rx / DC Orders   ED Discharge Orders          Ordered    pantoprazole  (PROTONIX ) 40 MG tablet  Daily        03/13/24 1137             Note:  This document was prepared using Dragon voice recognition software and may include unintentional dictation errors.   Ernest Ronal BRAVO, MD 03/13/24 1137    Ernest Ronal BRAVO, MD 03/13/24 [phone]

## 2024-03-13 NOTE — ED Notes (Signed)
 While RN was assisting a patient in another room, pt left AMA. Ernest, MD, made aware.

## 2024-03-13 NOTE — ED Triage Notes (Signed)
 Pt presents ambulatory to triage. States that he is bedbound, pt states that he can't get off the couch, states that he feels like he's dying and doesn't have a place to die, states that he's been staying on his boy's mom's couch. States that he was sick prior but that his son just passed away and couldn't go to the funeral. Reports very weak and fatigued and abd burning and thinks he's constipated

## 2024-03-14 ENCOUNTER — Telehealth: Payer: Self-pay

## 2024-03-14 NOTE — Telephone Encounter (Signed)
 Patients wife called stating that he needs something else beside the belsomra  its not working , states that he picked what he wanted to take off of a poster

## 2024-03-14 NOTE — Telephone Encounter (Signed)
 Patients wife called and left multiple messages , one was asking for a new sleep medication and the other one for you to order a angiogram for some kind of artery? Does the patient need to make an appt with you ?

## 2024-03-18 ENCOUNTER — Ambulatory Visit: Admitting: Internal Medicine

## 2024-03-22 DEATH — deceased

## 2024-04-08 NOTE — Progress Notes (Deleted)
 04/08/2024 Jeremiah [name] [id number] 1966/05/04  Gastroenterology Office Note    Referring Provider: Albina GORMAN Dine, MD Primary Care Physician:  Albina GORMAN Dine, MD  Primary GI Provider: Jinny Carmine, MD    Chief Complaint   No chief complaint on file.    History of Present Illness   Jeremiah [name] is a 58 y.o. male with PMHX of *** , presenting today at the request of Tejan-Sie, S Ahmed, MD due to consipation   Patient seen in the emergency room on 03/13/2024 with complaints of abdominal pain, weakness, constipation.   03/13/2024 CT abdomen pelvis with contrast.   No acute findings.  Mild fecal retention throughout the colon.  02/26/2024 Abd xray: Large volume of colonic stool consistent with constipation. Suspected gaseous distention of redundant sigmoid colon.   Past Medical History:  Diagnosis Date   Anxiety    Benign essential HTN 08/27/2014   Borderline personality disorder (HCC)    BP (high blood pressure) 01/16/2015   Difficulty urinating    Diverticulosis    Dysuria    External hemorrhoid    Gastroparesis    Hypertension    Hypokalemia    Major depression    Microscopic hematuria    Nicotine  addiction    Over weight    Rectal fistula    Schizophrenia (HCC)    Stroke [facility])     Past Surgical History:  Procedure Laterality Date   BACK SURGERY     COLONOSCOPY     COLONOSCOPY WITH PROPOFOL  N/A 10/12/2014   Procedure: COLONOSCOPY WITH PROPOFOL ;  Surgeon: Deward CINDERELLA Piedmont, MD;  Location: [facility] ENDOSCOPY;  Service: Gastroenterology;  Laterality: N/A;   LUMBAR SPINE SURGERY     TONSILLECTOMY      Current Outpatient Medications  Medication Sig Dispense Refill   amLODipine  (NORVASC ) 10 MG tablet TAKE ONE TABLET (10 MG TOTAL) BY MOUTH DAILY. 30 tablet 2   atorvastatin  (LIPITOR) 20 MG tablet TAKE ONE TABLET BY MOUTH EVERY NIGHT AT BEDTIME 100 tablet 0   bisacodyl  (DULCOLAX) 10 MG suppository Place 1 suppository (10 mg total) rectally as needed for  moderate constipation. (Patient not taking: Reported on 02/26/2024) 12 suppository 0   busPIRone  (BUSPAR ) 7.5 MG tablet TAKE ONE TABLET (7.5 MG TOTAL) BY MOUTH TWO TIMES DAILY. (Patient not taking: Reported on 02/26/2024) 180 tablet 0   carvedilol  (COREG ) 25 MG tablet TAKE TWO TABLETS (50 MG TOTAL) BY MOUTH TWO (TWO) TIMES DAILY WITH A MEAL 360 tablet 0   chlorthalidone  (HYGROTON ) 25 MG tablet TAKE ONE TABLET (25 MG TOTAL) BY MOUTH EVERY MORNING. 30 tablet 2   cyanocobalamin  (VITAMIN B12) 1000 MCG/ML injection Inject 1,000 mcg into the muscle.     escitalopram  (LEXAPRO ) 10 MG tablet Take 10 mg by mouth. (Patient not taking: Reported on 02/26/2024)     hydrOXYzine  (ATARAX ) 25 MG tablet Take 1 tablet (25 mg total) by mouth daily as needed for anxiety (sleep). (Patient not taking: Reported on 02/26/2024) 30 tablet 0   JUBLIA 10 % SOLN APPLY TO AFFECTED TOENAIL DAILY, COVERING THE TOE, NAIL BED, AND SURROUNDING SKIN ETC. (Patient not taking: Reported on 02/26/2024) 4 mL 2   lactulose  (CHRONULAC ) 10 GM/15ML solution Take 45 mLs (30 g total) by mouth 2 (two) times daily as needed for mild constipation or moderate constipation. (Patient not taking: Reported on 02/26/2024) 236 mL 0   melatonin 5 MG TABS Take 1 tablet (5 mg total) by mouth at bedtime. 30 tablet 0  OLANZapine  (ZYPREXA ) 5 MG tablet Take 1 tablet (5 mg total) by mouth at bedtime. 30 tablet 0   pantoprazole  (PROTONIX ) 40 MG tablet Take 1 tablet (40 mg total) by mouth daily for 14 days. 14 tablet 0   polyethylene glycol powder (GLYCOLAX /MIRALAX ) 17 GM/SCOOP powder 1 cap full in a full glass of water, two times a day for 3 days. (Patient not taking: Reported on 02/26/2024) 255 g 0   QUEtiapine  (SEROQUEL ) 100 MG tablet Take 100 mg by mouth at bedtime.     QUEtiapine  (SEROQUEL ) 300 MG tablet Take 300 mg by mouth 2 (two) times daily.     senna-docusate (SENOKOT-S) 8.6-50 MG tablet Take 2 tablets by mouth 2 (two) times daily. (Patient not taking: Reported on  02/26/2024) 120 tablet 0   sodium chloride  1 g tablet Take 1 tablet (1 g total) by mouth 3 (three) times daily with meals. (Patient not taking: Reported on 02/26/2024) 90 tablet 1   Suvorexant  (BELSOMRA ) 10 MG TABS Take 1 tablet (10 mg total) by mouth daily. 30 tablet 2   temazepam  (RESTORIL ) 15 MG capsule Take 15 mg by mouth at bedtime as needed.     Testosterone  1.62 % GEL SMARTSIG:40.5 Milligram(s) Topical Every Morning (Patient not taking: Reported on 02/26/2024)     No current facility-administered medications for this visit.    Allergies as of 04/09/2024 - Review Complete 03/13/2024  Allergen Reaction Noted   Diflucan  [fluconazole ] Diarrhea 11/13/2014   Ciprofloxacin  Other (See Comments) 09/11/2014   Other  12/25/2014   Pepto-bismol [bismuth subsalicylate] Other (See Comments) 09/11/2014   Trazodone  and nefazodone Other (See Comments) 09/11/2014   Elemental sulfur Other (See Comments) 10/09/2014    Family History  Problem Relation Age of Onset   Stroke Mother    Stroke Maternal Grandmother    Diabetes Mellitus II Sister     Social History   Socioeconomic History   Marital status: Single    Spouse name: Not on file   Number of children: Not on file   Years of education: Not on file   Highest education level: Not on file  Occupational History   Not on file  Tobacco Use   Smoking status: Former    Current packs/day: 0.00    Types: Cigarettes    Quit date: 08/19/2014    Years since quitting: 9.6   Smokeless tobacco: Never  Substance and Sexual Activity   Alcohol use: No    Alcohol/week: 0.0 standard drinks of alcohol    Comment: last drink was in march 2016   Drug use: No   Sexual activity: Not Currently  Other Topics Concern   Not on file  Social History Narrative   Not on file   Social Drivers of Health   Financial Resource Strain: Not on file  Food Insecurity: Food Insecurity Present (02/05/2024)   Hunger Vital Sign    Worried About Running Out of Food in  the Last Year: Sometimes true    Ran Out of Food in the Last Year: Sometimes true  Transportation Needs: No Transportation Needs (02/05/2024)   PRAPARE - Administrator, Civil Service (Medical): No    Lack of Transportation (Non-Medical): No  Physical Activity: Not on file  Stress: Not on file  Social Connections: Not on file  Intimate Partner Violence: Not At Risk (02/05/2024)   Humiliation, Afraid, Rape, and Kick questionnaire    Fear of Current or Ex-Partner: No    Emotionally Abused: No  Physically Abused: No    Sexually Abused: No     RELEVANT GI HISTORY, IMAGING AND LABS: CBC    Component Value Date/Time   WBC 5.1 03/13/2024 0945   RBC 4.87 03/13/2024 0945   HGB 15.1 03/13/2024 0945   HGB 13.4 12/04/2023 1542   HCT 42.7 03/13/2024 0945   HCT 40.2 12/04/2023 1542   PLT 331 03/13/2024 0945   PLT 278 12/04/2023 1542   MCV 87.7 03/13/2024 0945   MCV 93 12/04/2023 1542   MCV 85 09/19/2014 1556   MCH 31.0 03/13/2024 0945   MCHC 35.4 03/13/2024 0945   RDW 12.1 03/13/2024 0945   RDW 11.9 12/04/2023 1542   RDW 12.5 09/19/2014 1556   LYMPHSABS 1.8 02/05/2024 0312   LYMPHSABS 1.1 12/04/2023 1542   LYMPHSABS 2.6 08/22/2014 0413   MONOABS 0.8 02/05/2024 0312   MONOABS 0.8 08/22/2014 0413   EOSABS 0.1 02/05/2024 0312   EOSABS 0.2 12/04/2023 1542   EOSABS 0.1 08/22/2014 0413   BASOSABS 0.0 02/05/2024 0312   BASOSABS 0.0 12/04/2023 1542   BASOSABS 0.1 08/22/2014 0413   Recent Labs    12/04/23 1542 01/10/24 1742 02/04/24 1030 02/05/24 0312 02/08/24 0946 03/13/24 0945  HGB 13.4 15.6 14.6 13.3 15.7 15.1    CMP     Component Value Date/Time   NA 136 03/13/2024 0945   NA 137 02/26/2024 1205   NA 135 09/19/2014 1556   K 3.8 03/13/2024 0945   K 3.9 09/19/2014 1556   CL 101 03/13/2024 0945   CL 97 (L) 09/19/2014 1556   CO2 26 03/13/2024 0945   CO2 27 09/19/2014 1556   GLUCOSE 110 (H) 03/13/2024 0945   GLUCOSE 119 (H) 09/19/2014 1556   BUN 13  03/13/2024 0945   BUN 11 02/26/2024 1205   BUN 5 (L) 09/19/2014 1556   CREATININE 0.68 03/13/2024 0945   CREATININE 0.98 09/19/2014 1556   CALCIUM  9.4 03/13/2024 0945   CALCIUM  10.1 09/19/2014 1556   PROT 7.3 03/13/2024 0945   PROT 7.7 12/04/2023 1542   PROT 7.6 09/19/2014 1556   ALBUMIN 4.0 03/13/2024 0945   ALBUMIN 4.8 12/04/2023 1542   ALBUMIN 4.6 09/19/2014 1556   AST 22 03/13/2024 0945   AST 24 09/19/2014 1556   ALT 22 03/13/2024 0945   ALT 21 09/19/2014 1556   ALKPHOS 45 03/13/2024 0945   ALKPHOS 50 09/19/2014 1556   BILITOT 0.8 03/13/2024 0945   BILITOT 0.3 12/04/2023 1542   BILITOT 0.8 09/19/2014 1556   GFRNONAA >60 03/13/2024 0945   GFRNONAA >60 09/19/2014 1556   GFRAA >60 08/04/2017 0923   GFRAA >60 09/19/2014 1556      Latest Ref Rng & Units 03/13/2024    9:45 AM 02/08/2024    9:46 AM 02/05/2024    3:12 AM  Hepatic Function  Total Protein 6.5 - 8.1 g/dL 7.3  8.0  6.1   Albumin 3.5 - 5.0 g/dL 4.0  4.8  3.8   AST 15 - 41 U/L 22  31  27    ALT 0 - 44 U/L 22  21  19    Alk Phosphatase 38 - 126 U/L 45  62  50   Total Bilirubin 0.0 - 1.2 mg/dL 0.8  0.9  1.0       Review of Systems   All systems reviewed and negative except where noted in HPI.    Physical Exam  There were no vitals taken for this visit. No LMP for male patient. General:  Alert and oriented. Pleasant and cooperative. Well-nourished and well-developed.  Head:  Normocephalic and atraumatic. Eyes:  Without icterus Ears:  Normal auditory acuity. Neck:  Supple; no masses or thyromegaly. Lungs:  Respirations even and unlabored.  Clear throughout to auscultation.   No wheezes, crackles, or rhonchi. No acute distress. Heart:  Regular rate and rhythm; no murmurs, clicks, rubs, or gallops. Abdomen:  Normal bowel sounds.  No bruits.  Soft, non-tender and non-distended without masses, hepatosplenomegaly or hernias noted.  No guarding or rebound tenderness.  ***Negative Carnett sign.   Rectal:   Deferred.***  Msk:  Symmetrical without gross deformities. Normal posture. Extremities:  Without edema. Neurologic:  Alert and  oriented x4;  grossly normal neurologically. Skin:  Intact without significant lesions or rashes. Psych:  Alert and cooperative. Normal mood and affect.   Assessment & Plan   Jeremiah [name] is a 58 y.o. male presenting today with    I discussed the assessment and treatment plan with the patient. The patient was provided an opportunity to ask questions and all were answered. The patient agreed with the plan and demonstrated an understanding of the instructions.   The patient was advised to call back or seek an in-person evaluation if the symptoms worsen or if the condition fails to improve as anticipated.  Grayce Bohr, DNP, AGNP-C [facility] Gastroenterology

## 2024-04-09 ENCOUNTER — Ambulatory Visit: Admitting: Family Medicine

## 2024-04-10 ENCOUNTER — Other Ambulatory Visit: Payer: Self-pay | Admitting: Internal Medicine
